# Patient Record
Sex: Female | Born: 1965 | Hispanic: Yes | Marital: Married | Smoking: Never smoker
Health system: Southern US, Community
[De-identification: ages and names within clinical notes are randomized; demographics above are authoritative.]

## PROBLEM LIST (undated history)

## (undated) DIAGNOSIS — K5900 Constipation, unspecified: Secondary | ICD-10-CM

## (undated) DIAGNOSIS — F419 Anxiety disorder, unspecified: Secondary | ICD-10-CM

## (undated) DIAGNOSIS — T783XXA Angioneurotic edema, initial encounter: Secondary | ICD-10-CM

## (undated) DIAGNOSIS — I10 Essential (primary) hypertension: Secondary | ICD-10-CM

## (undated) DIAGNOSIS — L508 Other urticaria: Secondary | ICD-10-CM

## (undated) DIAGNOSIS — F32A Depression, unspecified: Secondary | ICD-10-CM

## (undated) DIAGNOSIS — T464X5A Adverse effect of angiotensin-converting-enzyme inhibitors, initial encounter: Secondary | ICD-10-CM

## (undated) DIAGNOSIS — N200 Calculus of kidney: Secondary | ICD-10-CM

## (undated) DIAGNOSIS — E785 Hyperlipidemia, unspecified: Secondary | ICD-10-CM

## (undated) DIAGNOSIS — N390 Urinary tract infection, site not specified: Secondary | ICD-10-CM

## (undated) DIAGNOSIS — T7840XA Allergy, unspecified, initial encounter: Secondary | ICD-10-CM

## (undated) DIAGNOSIS — J302 Other seasonal allergic rhinitis: Secondary | ICD-10-CM

## (undated) HISTORY — DX: Adverse effect of angiotensin-converting-enzyme inhibitors, initial encounter: T46.4X5A

## (undated) HISTORY — DX: Essential (primary) hypertension: I10

## (undated) HISTORY — DX: Hyperlipidemia, unspecified: E78.5

## (undated) HISTORY — DX: Constipation, unspecified: K59.00

## (undated) HISTORY — DX: Calculus of kidney: N20.0

## (undated) HISTORY — DX: Allergy, unspecified, initial encounter: T78.40XA

## (undated) HISTORY — DX: Depression, unspecified: F32.A

## (undated) HISTORY — DX: Angioneurotic edema, initial encounter: T78.3XXA

## (undated) HISTORY — DX: Urinary tract infection, site not specified: N39.0

## (undated) HISTORY — DX: Other seasonal allergic rhinitis: J30.2

## (undated) HISTORY — DX: Other urticaria: L50.8

---

## 1991-12-05 HISTORY — PX: OTHER SURGICAL HISTORY: SHX169

## 1991-12-05 HISTORY — PX: BREAST ENHANCEMENT SURGERY: SHX7

## 1993-12-04 HISTORY — PX: TUBAL LIGATION: SHX77

## 2007-01-09 ENCOUNTER — Other Ambulatory Visit: Admission: RE | Admit: 2007-01-09 | Discharge: 2007-01-09 | Payer: Self-pay | Admitting: Obstetrics and Gynecology

## 2007-04-01 ENCOUNTER — Emergency Department (HOSPITAL_COMMUNITY): Admission: EM | Admit: 2007-04-01 | Discharge: 2007-04-01 | Payer: Self-pay | Admitting: Emergency Medicine

## 2008-03-16 ENCOUNTER — Encounter: Admission: RE | Admit: 2008-03-16 | Discharge: 2008-03-16 | Payer: Self-pay | Admitting: Family Medicine

## 2010-01-13 ENCOUNTER — Ambulatory Visit (HOSPITAL_COMMUNITY): Admission: RE | Admit: 2010-01-13 | Discharge: 2010-01-13 | Payer: Self-pay | Admitting: Obstetrics and Gynecology

## 2010-12-25 ENCOUNTER — Encounter: Payer: Self-pay | Admitting: Obstetrics and Gynecology

## 2011-07-17 ENCOUNTER — Other Ambulatory Visit (INDEPENDENT_AMBULATORY_CARE_PROVIDER_SITE_OTHER): Payer: BC Managed Care – PPO

## 2011-07-17 ENCOUNTER — Encounter: Payer: Self-pay | Admitting: Internal Medicine

## 2011-07-17 ENCOUNTER — Other Ambulatory Visit: Payer: Self-pay | Admitting: Internal Medicine

## 2011-07-17 ENCOUNTER — Ambulatory Visit (INDEPENDENT_AMBULATORY_CARE_PROVIDER_SITE_OTHER): Payer: BC Managed Care – PPO | Admitting: Internal Medicine

## 2011-07-17 VITALS — BP 138/90 | HR 97 | Temp 98.2°F | Ht 63.0 in | Wt 181.0 lb

## 2011-07-17 DIAGNOSIS — IMO0001 Reserved for inherently not codable concepts without codable children: Secondary | ICD-10-CM

## 2011-07-17 DIAGNOSIS — T464X5A Adverse effect of angiotensin-converting-enzyme inhibitors, initial encounter: Secondary | ICD-10-CM | POA: Insufficient documentation

## 2011-07-17 DIAGNOSIS — I10 Essential (primary) hypertension: Secondary | ICD-10-CM | POA: Insufficient documentation

## 2011-07-17 DIAGNOSIS — E785 Hyperlipidemia, unspecified: Secondary | ICD-10-CM | POA: Insufficient documentation

## 2011-07-17 DIAGNOSIS — T783XXA Angioneurotic edema, initial encounter: Secondary | ICD-10-CM | POA: Insufficient documentation

## 2011-07-17 DIAGNOSIS — E118 Type 2 diabetes mellitus with unspecified complications: Secondary | ICD-10-CM | POA: Insufficient documentation

## 2011-07-17 DIAGNOSIS — Z0000 Encounter for general adult medical examination without abnormal findings: Secondary | ICD-10-CM

## 2011-07-17 DIAGNOSIS — E7800 Pure hypercholesterolemia, unspecified: Secondary | ICD-10-CM | POA: Insufficient documentation

## 2011-07-17 DIAGNOSIS — Z794 Long term (current) use of insulin: Secondary | ICD-10-CM | POA: Insufficient documentation

## 2011-07-17 LAB — BASIC METABOLIC PANEL
BUN: 11 mg/dL (ref 6–23)
Calcium: 8.9 mg/dL (ref 8.4–10.5)
Glucose, Bld: 312 mg/dL — ABNORMAL HIGH (ref 70–99)

## 2011-07-17 LAB — HEPATIC FUNCTION PANEL
AST: 18 U/L (ref 0–37)
Albumin: 3.9 g/dL (ref 3.5–5.2)
Bilirubin, Direct: 0.1 mg/dL (ref 0.0–0.3)
Total Bilirubin: 0.4 mg/dL (ref 0.3–1.2)
Total Protein: 7.6 g/dL (ref 6.0–8.3)

## 2011-07-17 LAB — URINALYSIS, ROUTINE W REFLEX MICROSCOPIC
Bilirubin Urine: NEGATIVE
Hgb urine dipstick: NEGATIVE
Nitrite: NEGATIVE
Specific Gravity, Urine: 1.02 (ref 1.000–1.030)
Urine Glucose: >=1000
pH: 7 (ref 5.0–8.0)

## 2011-07-17 LAB — CBC WITH DIFFERENTIAL/PLATELET
Basophils Relative: 0.2 % (ref 0.0–3.0)
Eosinophils Relative: 1.2 % (ref 0.0–5.0)
HCT: 39.9 % (ref 36.0–46.0)
Hemoglobin: 13 g/dL (ref 12.0–15.0)
Lymphocytes Relative: 30.9 % (ref 12.0–46.0)
Lymphs Abs: 2 10*3/uL (ref 0.7–4.0)
Monocytes Relative: 6.9 % (ref 3.0–12.0)
Neutro Abs: 3.9 10*3/uL (ref 1.4–7.7)
RDW: 15.2 % — ABNORMAL HIGH (ref 11.5–14.6)

## 2011-07-17 LAB — LIPID PANEL
HDL: 42.6 mg/dL (ref >39.00)
Triglycerides: 248 mg/dL — ABNORMAL HIGH (ref 0.0–149.0)
VLDL: 49.6 mg/dL — ABNORMAL HIGH (ref 0.0–40.0)

## 2011-07-17 LAB — LDL CHOLESTEROL, DIRECT: Direct LDL: 145.9 mg/dL

## 2011-07-17 MED ORDER — GLIMEPIRIDE 4 MG PO TABS: 4.0000 mg | ORAL_TABLET | ORAL | Status: DC

## 2011-07-17 MED ORDER — ASPIRIN EC 81 MG PO TBEC: 81.0000 mg | DELAYED_RELEASE_TABLET | Freq: Every day | ORAL | Status: AC

## 2011-07-17 MED ORDER — LISINOPRIL-HYDROCHLOROTHIAZIDE 20-12.5 MG PO TABS: 1.0000 | ORAL_TABLET | Freq: Every day | ORAL | Status: DC

## 2011-07-17 NOTE — Patient Instructions (Addendum)
Please remember to followup with your GYN for the yearly pap smear and/or mammogram Please go to LAB in the Basement for the blood and/or urine tests to be done today Please call the phone number [phone] (the PhoneTree System) for results of testing in 2-3 days;  When calling, simply dial the number, and when prompted enter the MRN number above (the Medical Record Number) and the # key, then the message should start. Please see your dentist for your upper back left teeth Please start Aspirin 81 mg - 1 per day, COATED only OK to stop the byetta and metformin as you have Start the Glimeparide 4 mg per day Continue all other medications as before - the lisinopril-HCT Based on your Blood tests, we may need to add further medicaiton such as actos or januvia for the sugar Please return in 6 mo with Lab testing done 3-5 days before

## 2011-07-17 NOTE — Progress Notes (Signed)
Subjective:    Patient ID: Holly Ortiz, female    DOB: 24-Oct-1966, 45 y.o.   MRN: [id number]  HPI  Here for wellness and f/u;  Overall doing ok;  Pt denies CP, worsening SOB, DOE, wheezing, orthopnea, PND, worsening LE edema, palpitations, dizziness or syncope.  Pt denies neurological change such as new Headache, facial or extremity weakness.  Pt denies polydipsia, polyuria, or low sugar symptoms. Pt states overall good compliance with treatment and medications, good tolerability, and trying to follow lower cholesterol diet.  Pt denies worsening depressive symptoms, suicidal ideation or panic. No fever, wt loss, night sweats, loss of appetite, or other constitutional symptoms.  Pt states good ability with ADL's, low fall risk, home safety reviewed and adequate, no significant changes in hearing or vision, and occasionally active with exercise.  Gets some nausea with byetta and metformin, and out of all meds since late June 2012. Was seeing urgent care for routine care, not owrking out - sugars more than 200 even on the meds. Past Medical History  Diagnosis Date  . UTI (lower urinary tract infection)   . Allergy   . Asthma   . Diabetes mellitus     type II  . Hyperlipidemia   . Hypertension    History reviewed. No pertinent past surgical history.  reports that she has never smoked. She does not have any smokeless tobacco history on file. She reports that she does not drink alcohol or use illicit drugs. family history includes Cancer in her other; Diabetes in her other; Heart disease in her other; Hyperlipidemia in her other; and Hypertension in her other. No Known Allergies No current outpatient prescriptions on file prior to visit.   Review of Systems Review of Systems  Constitutional: Negative for diaphoresis, activity change, appetite change and unexpected weight change.  HENT: Negative for hearing loss, ear pain, facial swelling, mouth sores and neck stiffness.   Eyes: Negative for  pain, redness and visual disturbance.  Respiratory: Negative for shortness of breath and wheezing.   Cardiovascular: Negative for chest pain and palpitations.  Gastrointestinal: Negative for diarrhea, blood in stool, abdominal distention and rectal pain.  Genitourinary: Negative for hematuria, flank pain and decreased urine volume.  Musculoskeletal: Negative for myalgias and joint swelling.  Skin: Negative for color change and wound.  Neurological: Negative for syncope and numbness.  Hematological: Negative for adenopathy.  Psychiatric/Behavioral: Negative for hallucinations, self-injury, decreased concentration and agitation.      Objective:   Physical Exam BP 138/90  Pulse 97  Temp(Src) 98.2 F (36.8 C) (Oral)  Ht 5\' 3"  (1.6 m)  Wt 181 lb (82.101 kg)  BMI 32.06 kg/m2  SpO2 97%  LMP 07/05/2011 Physical Exam  VS noted Constitutional: Pt is oriented to person, place, and time. Appears well-developed and well-nourished.  HENT:  Head: Normocephalic and atraumatic.  Right Ear: External ear normal.  Left Ear: External ear normal.  Nose: Nose normal.  Mouth/Throat: Oropharynx is clear and moist. upper back left teeth are carious Eyes: Conjunctivae and EOM are normal. Pupils are equal, round, and reactive to light.  Neck: Normal range of motion. Neck supple. No JVD present. No tracheal deviation present.  Cardiovascular: Normal rate, regular rhythm, normal heart sounds and intact distal pulses.   Pulmonary/Chest: Effort normal and breath sounds normal.  Abdominal: Soft. Bowel sounds are normal. There is no tenderness.  Musculoskeletal: Normal range of motion. Exhibits no edema.  Lymphadenopathy:  Has no cervical adenopathy.  Neurological: Pt is alert and oriented  to person, place, and time. Pt has normal reflexes. No cranial nerve deficit.  Skin: Skin is warm and dry. No rash noted.  Psychiatric:  Has  normal mood and affect. Behavior is normal.  1+ nervous       Assessment &  Plan:

## 2011-07-18 ENCOUNTER — Other Ambulatory Visit: Payer: Self-pay | Admitting: Internal Medicine

## 2011-07-18 MED ORDER — ATORVASTATIN CALCIUM 20 MG PO TABS: 20.0000 mg | ORAL_TABLET | Freq: Every day | ORAL | Status: DC

## 2011-07-18 MED ORDER — PIOGLITAZONE HCL 30 MG PO TABS: 30.0000 mg | ORAL_TABLET | Freq: Every day | ORAL | Status: DC

## 2011-10-17 ENCOUNTER — Telehealth: Payer: Self-pay

## 2011-10-17 MED ORDER — SITAGLIPTIN PHOSPHATE 100 MG PO TABS: 100.0000 mg | ORAL_TABLET | Freq: Every day | ORAL | Status: DC

## 2011-10-17 NOTE — Telephone Encounter (Signed)
Patient informed. 

## 2011-10-17 NOTE — Telephone Encounter (Signed)
Pt called stating that one of the listed side effects of Actos is weight gain and she has gained 15 lbs since starting medication in Aug. She has decided to D/C medication but is requesting alternative therapy, please advise.

## 2011-10-17 NOTE — Telephone Encounter (Signed)
Ok to change to [place] 100 mg qd - done per [employer]

## 2012-01-19 ENCOUNTER — Ambulatory Visit: Payer: BC Managed Care – PPO | Admitting: Internal Medicine

## 2012-01-19 DIAGNOSIS — Z0289 Encounter for other administrative examinations: Secondary | ICD-10-CM

## 2012-07-31 ENCOUNTER — Encounter: Payer: Self-pay | Admitting: Obstetrics and Gynecology

## 2012-07-31 ENCOUNTER — Ambulatory Visit (INDEPENDENT_AMBULATORY_CARE_PROVIDER_SITE_OTHER): Payer: BC Managed Care – PPO | Admitting: Obstetrics and Gynecology

## 2012-07-31 VITALS — BP 122/80 | HR 82 | Ht 63.0 in | Wt 185.0 lb

## 2012-07-31 DIAGNOSIS — N390 Urinary tract infection, site not specified: Secondary | ICD-10-CM

## 2012-07-31 DIAGNOSIS — IMO0002 Reserved for concepts with insufficient information to code with codable children: Secondary | ICD-10-CM

## 2012-07-31 DIAGNOSIS — Z01419 Encounter for gynecological examination (general) (routine) without abnormal findings: Secondary | ICD-10-CM

## 2012-07-31 DIAGNOSIS — Z124 Encounter for screening for malignant neoplasm of cervix: Secondary | ICD-10-CM

## 2012-07-31 LAB — POCT URINALYSIS DIPSTICK
Blood, UA: NEGATIVE
Nitrite, UA: NEGATIVE
Protein, UA: NEGATIVE
Urobilinogen, UA: NEGATIVE
pH, UA: 5

## 2012-07-31 NOTE — Progress Notes (Signed)
Subjective:    Holly Ortiz is a 46 y.o. female, G4P0, who presents for an annual exam. The patient requests a check for urinary tract infection.  Also reports increased mood changes prior to menses over the past 6 months. This is accompanied by premenstrual bloating, fatigue, sleep disturbance, cravings, and loose stools. Denies breast pain.  She further admits to deep dyspareunia that occurs after intercourse and is described as crampy, lasting for hours or a day.  Husband works out of town so intercourse is infrequent.   Menstrual cycle:   LMP: Patient's last menstrual period was 07/05/2012.  Flow 7-10 days, overnight pad change every 1-1.5 hours; no cramps, just leg pain [CBC-wnl & TSH-wnl from 07/2012]             Review of Systems Pertinent items are noted in HPI. Denies pelvic pain, urinary tract symptoms, vaginitis symptoms, irregular bleeding, menopausal symptoms, change in bowel habits or rectal bleeding   Objective:    BP 122/80  Pulse 82  Ht 5\' 3"  (1.6 m)  Wt 185 lb (83.915 kg)  BMI 32.77 kg/m2  LMP 07/05/2012   Wt Readings from Last 1 Encounters:  07/31/12 185 lb (83.915 kg)   Body mass index is 32.77 kg/(m^2). General Appearance: Alert, no acute distress HEENT: Grossly normal Neck / Thyroid: Supple, no thyromegaly or cervical adenopathy Lungs: Clear to auscultation bilaterally Back: No CVA tenderness Breast Exam: No masses or nodes.No dimpling, nipple retraction or discharge. Cardiovascular: Regular rate and rhythm.  Gastrointestinal: Soft, non-tender, no masses or organomegaly Pelvic Exam: EGBUS-wnl, vagina-normal rugae, cervix- without lesions or tenderness, uterus appears normal size shape and consistency, adnexae-no masses or tenderness Rectovaginal: deferred due to multiple hemorrhoids (patient request) Lymphatic Exam: Non-palpable nodes in neck, clavicular,  axillary, or inguinal regions  Skin: no rashes or abnormalities Extremities: no clubbing cyanosis  or edema  Neurologic: grossly normal Psychiatric: Alert and oriented  Urinalysis: PH- 5.0, SG- 1.020, 3+ glucose otherwise negative   Assessment:   Routine GYN Exam Diabetes (uncontrolled) Menorrhagia PMS Dyspareunia Glucosuria Plan:  Reviewed management options for PMS: BCPs, Sarafem, Progesterone, Fem-Premenstrual  & supplements  Patient to try Fem-Premenstrual  Routnine MG  Pelvic U/S- dyspareunia  PAP sent  RTO 1 year or prn  Haakon Titsworth,ELMIRAPA-C

## 2012-07-31 NOTE — Progress Notes (Signed)
Regular Periods: yes Mammogram: yes  Monthly Breast Ex.: no Exercise: yes  Tetanus < 10 years: yes Seatbelts: yes  NI. Bladder Functn.: yes Abuse at home: no  Daily BM's: yes Stressful Work: yes  Healthy Diet: yes Sigmoid-Colonoscopy: age 46  Calcium: no Medical problems this year: request recheck on urine for uti   LAST PAP:2 years ago  Contraception: btl  Mammogram:  2 years ago  PCP: DR. Oliver Barre  PMH: NO CHANGE  FMH: NO CHANGE  Last Bone Scan: NO   PT STATES THAT AROUND HER CYCLE SHE IS VERY EMOTIONAL AND MOODY. SHE HAS NOT FELT THIS WAY BEFORE.

## 2012-07-31 NOTE — Patient Instructions (Signed)
Fem-Premenstrual from [facility] for PMS  May also want to add Calcium with Magnesium and Vitamin D tablet daily to help with PMS

## 2012-08-01 LAB — PAP IG W/ RFLX HPV ASCU

## 2012-08-07 ENCOUNTER — Encounter: Payer: BC Managed Care – PPO | Admitting: Obstetrics and Gynecology

## 2012-08-07 ENCOUNTER — Other Ambulatory Visit: Payer: BC Managed Care – PPO

## 2012-08-08 ENCOUNTER — Ambulatory Visit
Admission: RE | Admit: 2012-08-08 | Discharge: 2012-08-08 | Disposition: A | Discharge status: Home / Self Care | Payer: BC Managed Care – PPO | Source: Ambulatory Visit | Attending: Obstetrics and Gynecology | Admitting: Obstetrics and Gynecology

## 2012-08-08 DIAGNOSIS — Z01419 Encounter for gynecological examination (general) (routine) without abnormal findings: Secondary | ICD-10-CM

## 2012-08-13 ENCOUNTER — Encounter: Payer: Self-pay | Admitting: Obstetrics and Gynecology

## 2012-08-13 ENCOUNTER — Ambulatory Visit (INDEPENDENT_AMBULATORY_CARE_PROVIDER_SITE_OTHER): Payer: BC Managed Care – PPO | Admitting: Obstetrics and Gynecology

## 2012-08-13 ENCOUNTER — Ambulatory Visit (INDEPENDENT_AMBULATORY_CARE_PROVIDER_SITE_OTHER): Payer: BC Managed Care – PPO

## 2012-08-13 VITALS — BP 124/72 | Temp 98.9°F | Wt 190.0 lb

## 2012-08-13 DIAGNOSIS — IMO0002 Reserved for concepts with insufficient information to code with codable children: Secondary | ICD-10-CM

## 2012-08-13 NOTE — Progress Notes (Signed)
46 YO with dyspareunia for an ultrasound.  O:  U/S- uterus is 9.49 x 5.31 x 4.63 cm, left ovary-4.85 x 2.71 x 2.91 cm and right ovary-2.30 x 1.26 x 1.41 cm  A: Dyspareunia  P: Reviewed causes of pelvic pain: urogenital, previous surgery, gastrointestinal and musculoskeletal.       RTO-as scheduled or prn  River Ambrosio, PA-C

## 2012-12-04 HISTORY — PX: CATARACT EXTRACTION, BILATERAL: SHX1313

## 2012-12-04 HISTORY — PX: COLONOSCOPY: SHX174

## 2013-12-04 DIAGNOSIS — H269 Unspecified cataract: Secondary | ICD-10-CM

## 2013-12-04 HISTORY — DX: Unspecified cataract: H26.9

## 2014-10-05 ENCOUNTER — Encounter: Payer: Self-pay | Admitting: Obstetrics and Gynecology

## 2014-12-04 HISTORY — PX: SOFT TISSUE MASS EXCISION: SHX2419

## 2017-11-14 ENCOUNTER — Ambulatory Visit: Payer: Self-pay

## 2017-11-30 ENCOUNTER — Ambulatory Visit: Payer: Self-pay | Admitting: Family Medicine

## 2017-12-18 ENCOUNTER — Encounter: Payer: Self-pay | Admitting: Internal Medicine

## 2017-12-18 ENCOUNTER — Ambulatory Visit: Payer: Self-pay | Admitting: Internal Medicine

## 2017-12-18 VITALS — BP 170/100 | HR 86 | Resp 12 | Ht 61.5 in | Wt 182.0 lb

## 2017-12-18 DIAGNOSIS — Z79899 Other long term (current) drug therapy: Secondary | ICD-10-CM

## 2017-12-18 DIAGNOSIS — E785 Hyperlipidemia, unspecified: Secondary | ICD-10-CM

## 2017-12-18 DIAGNOSIS — I10 Essential (primary) hypertension: Secondary | ICD-10-CM

## 2017-12-18 DIAGNOSIS — T7840XD Allergy, unspecified, subsequent encounter: Secondary | ICD-10-CM

## 2017-12-18 DIAGNOSIS — L508 Other urticaria: Secondary | ICD-10-CM

## 2017-12-18 DIAGNOSIS — E119 Type 2 diabetes mellitus without complications: Secondary | ICD-10-CM

## 2017-12-18 LAB — GLUCOSE, POCT (MANUAL RESULT ENTRY): POC Glucose: 484 mg/dl — AB (ref 70–99)

## 2017-12-18 MED ORDER — GLIMEPIRIDE 4 MG PO TABS: 4.0000 mg | ORAL_TABLET | ORAL | 3 refills | Status: DC

## 2017-12-18 MED ORDER — ESCITALOPRAM OXALATE 20 MG PO TABS: 20.0000 mg | ORAL_TABLET | Freq: Every day | ORAL | 3 refills | Status: DC

## 2017-12-18 MED ORDER — METFORMIN HCL 500 MG PO TABS: 500.0000 mg | ORAL_TABLET | Freq: Two times a day (BID) | ORAL | 3 refills | Status: DC

## 2017-12-18 MED ORDER — LISINOPRIL-HYDROCHLOROTHIAZIDE 20-12.5 MG PO TABS: 1.0000 | ORAL_TABLET | Freq: Every day | ORAL | 3 refills | Status: DC

## 2017-12-18 MED ORDER — ATORVASTATIN CALCIUM 20 MG PO TABS: 20.0000 mg | ORAL_TABLET | Freq: Every day | ORAL | 3 refills | Status: DC

## 2017-12-18 NOTE — Progress Notes (Signed)
Subjective:    Patient ID: Holly Ortiz, female    DOB: 20-Nov-1966, 52 y.o.   MRN: [id number]  HPI   Here to establish  Ran out of all medication save for Escitalopram, which she takes for Depression/Anxiety, in August when ran out of insurance.  She and husband moved down to [place] following his retirement.   She has never been without a job or insurance previously.   1.  Essential Hypertension:  Had PIH with all 4 of her pregnancies and maintained hypertension in 1995 following birth of 4th child.  Has always been well controlled with medication.  Lisinopril/HCTZ.  2.  DM:  Diagnosed age 16 yo.  Control up and down with medication.   Walks half mile daily. Could improve diet.  Cannot recall last A1C in June 2018--was at 9.0% perhaps. Did not get an influenza vaccine this year. Pneumococcal vaccine about 3 years. Does check feet every night. No peripheral neuropathy Has had cataract surgery in 2015, bilateral No diabetic retinopathy. Last eye exam 2017   3.  Allergies:  Breaks out in hives for 11 years.  Has been fully evaluated by different specialists.  Not able to find the etiology.  Singulair helps.  Has never taken antihistamines other than Benadryl.  Current Meds  Medication Sig  . aspirin 81 MG tablet Take 81 mg by mouth daily.  Marland Kitchen escitalopram (LEXAPRO) 20 MG tablet Take 20 mg by mouth daily.    Allergies  Allergen Reactions  . Actos [Pioglitazone Hydrochloride] Other (See Comments)    Wt gain   Past Medical History:  Diagnosis Date  . Allergy   . Asthma   . Diabetes mellitus    type II  . Hyperlipidemia   . Hypertension   . UTI (lower urinary tract infection)     Past Surgical History:  Procedure Laterality Date  . CATARACT EXTRACTION, BILATERAL Bilateral 2014  . SOFT TISSUE MASS EXCISION Right 2016   [place] area  . TUBAL LIGATION  1995    Family History  Problem Relation Age of Onset  . Hyperlipidemia Other   . Heart disease Other   . Diabetes  Father   . Hypertension Father   . COPD Father        Cause of death  . Colon cancer Mother   . Hypertension Mother   . Hypertension Sister   . Diabetes Sister   . Hypertension Brother   . Cancer Sister        Brain glioblastoma   Social History   Socioeconomic History  . Marital status: Married    Spouse name: Audelia Hives  . Number of children: 4  . Years of education: 13--1.5 years of college  . Highest education level: 12th grade  Social Needs  . Financial resource strain: Not on file  . Food insecurity - worry: Sometimes true  . Food insecurity - inability: Never true  . Transportation needs - medical: No  . Transportation needs - non-medical: No  Occupational History  . Occupation: banking  Tobacco Use  . Smoking status: Never Smoker  . Smokeless tobacco: Never Used  Substance and Sexual Activity  . Alcohol use: No  . Drug use: No  . Sexual activity: Yes    Birth control/protection: Surgical    Comment: BTL  Other Topics Concern  . Not on file  Social History Narrative  . Not on file        Review of Systems     Objective:  Physical Exam  NAD Skin:  Macular erythematous lesions scattered mainly on forehead and cheeks as well as anterior base of neck. HEENT;  PERRL, EOMI, discs sharp, TMs pearly gray, throat without injection.  Multiple missing molars. Neck:  Supple, No adenopathy, no thyromegaly Chest:  CTA CV: RRR with normal S1 and S2, No S3, S4 or murmur.  No carotid bruits.  Carotid, radial and DP pulses normal and equal Abd:  S, NT, No HSM or mass, + BS.  Abdomen obese LE:  No edema Cool cat shoes.      Assessment & Plan:  1.  DM:  Refill Metformin and Glimepiride.  Later, confirmed Januvia is available through [facility] at [facility].  Will go ahead and send Rx there and notify patient. Has glucometer and strips at home and will monitor blood glucose and bring in to next visit. To work on diet and gradually increase physical activity.  2. Essential  Hypertension:  Restart Lisinopril/HCTZ..  3.  Depression:  Encouraged working with Macie Burows, LCSW.  Escitalopram refilled as well.  4.  Recurrent idiopathic Hives:  Loratadine 10 mg daily. GoodRx for Montelukast 10 mg daily until can get orange card.  5.  Hyperlipidemia:  Restart Atorvastatin.  Follow up in 1 month for BP check and fasting labs:  FLP.  With me in 2 months with glucose monitoring documents.

## 2017-12-18 NOTE — Progress Notes (Signed)
LCSW completed new patient screening with pt. Pt reported that she is experiencing anhedonia, depression, fatigue, and feelings of worthlessness since she has been unable to find a job over the past 7 months. She denied any suicidal ideation. She shared that while she feels down, she does not have any issues with her basic needs/social determinants of health. She reported that she does not want counseling now but may call in the future if needed. LCSW provided contact info and encouraged pt to call if she is interested in counseling in the future.

## 2017-12-18 NOTE — Patient Instructions (Addendum)
Can google "advance directives, [place]"  And bring up form from Secretary of [place]. Print and fill out Or can go to "5 wishes"  Which is also in Spanish and fill out--this costs $5--perhaps easier to use. Designate a Forensic scientist to speak for you if you are unable to speak for yourself when ill or injured   Drink a glass of water before every meal Drink 6-8 glasses of water daily Eat three meals daily Eat a protein and healthy fat with every meal (eggs,fish, chicken, [place] and limit red meats) Eat 5 servings of vegetables daily, mix the colors Eat 2 servings of fruit daily with skin, if skin is edible Use smaller plates Put food/utensils down as you chew and swallow each bite Eat at a table with friends/family at least once daily, no TV Do not eat in front of the TV  Recent studies show that people who consume all of their calories in a 12 hour period lose weight more efficiently.  For example, if you eat your first meal at 7:00 a.m., your last meal of the day should be completed by 7:00 p.m.

## 2017-12-19 ENCOUNTER — Other Ambulatory Visit: Payer: Self-pay | Admitting: Internal Medicine

## 2017-12-19 ENCOUNTER — Encounter: Payer: Self-pay | Admitting: Internal Medicine

## 2017-12-19 DIAGNOSIS — D649 Anemia, unspecified: Secondary | ICD-10-CM

## 2017-12-19 LAB — COMPREHENSIVE METABOLIC PANEL
ALK PHOS: 83 IU/L (ref 39–117)
ALT: 27 IU/L (ref 0–32)
AST: 25 IU/L (ref 0–40)
Albumin/Globulin Ratio: 1.3 (ref 1.2–2.2)
Albumin: 4.3 g/dL (ref 3.5–5.5)
BUN / CREAT RATIO: 23 (ref 9–23)
BUN: 11 mg/dL (ref 6–24)
Bilirubin Total: 0.2 mg/dL (ref 0.0–1.2)
CO2: 25 mmol/L (ref 20–29)
CREATININE: 0.47 mg/dL — AB (ref 0.57–1.00)
Calcium: 9.8 mg/dL (ref 8.7–10.2)
Chloride: 97 mmol/L (ref 96–106)
GFR calc Af Amer: 133 mL/min/{1.73_m2} (ref >59)
GFR calc non Af Amer: 116 mL/min/{1.73_m2} (ref >59)
GLOBULIN, TOTAL: 3.3 g/dL (ref 1.5–4.5)
Glucose: 362 mg/dL — ABNORMAL HIGH (ref 65–99)
Potassium: 4.5 mmol/L (ref 3.5–5.2)
SODIUM: 139 mmol/L (ref 134–144)
Total Protein: 7.6 g/dL (ref 6.0–8.5)

## 2017-12-19 LAB — MICROALBUMIN / CREATININE URINE RATIO
Creatinine, Urine: 44.4 mg/dL
Microalb/Creat Ratio: 56.8 mg/g creat — ABNORMAL HIGH (ref 0.0–30.0)
Microalbumin, Urine: 25.2 ug/mL

## 2017-12-19 LAB — CBC WITH DIFFERENTIAL/PLATELET
BASOS: 0 %
Basophils Absolute: 0 10*3/uL (ref 0.0–0.2)
EOS (ABSOLUTE): 0.1 10*3/uL (ref 0.0–0.4)
EOS: 1 %
HEMATOCRIT: 37.7 % (ref 34.0–46.6)
Hemoglobin: 11.7 g/dL (ref 11.1–15.9)
Immature Grans (Abs): 0 10*3/uL (ref 0.0–0.1)
Immature Granulocytes: 0 %
Lymphocytes Absolute: 2 10*3/uL (ref 0.7–3.1)
Lymphs: 23 %
MCH: 23.6 pg — AB (ref 26.6–33.0)
MCHC: 31 g/dL — AB (ref 31.5–35.7)
MCV: 76 fL — AB (ref 79–97)
MONOS ABS: 0.5 10*3/uL (ref 0.1–0.9)
Monocytes: 5 %
Neutrophils Absolute: 5.9 10*3/uL (ref 1.4–7.0)
Neutrophils: 71 %
Platelets: 406 10*3/uL — ABNORMAL HIGH (ref 150–379)
RBC: 4.96 x10E6/uL (ref 3.77–5.28)
RDW: 17 % — AB (ref 12.3–15.4)
WBC: 8.5 10*3/uL (ref 3.4–10.8)

## 2017-12-19 LAB — HGB A1C W/O EAG: Hgb A1c MFr Bld: 13.2 % — ABNORMAL HIGH (ref 4.8–5.6)

## 2018-01-03 ENCOUNTER — Inpatient Hospital Stay (HOSPITAL_COMMUNITY)
Admission: EM | Admit: 2018-01-03 | Discharge: 2018-01-05 | DRG: 638 | Disposition: A | Discharge status: Home / Self Care | Payer: Self-pay | Attending: Internal Medicine | Admitting: Internal Medicine

## 2018-01-03 ENCOUNTER — Encounter (HOSPITAL_COMMUNITY): Payer: Self-pay

## 2018-01-03 ENCOUNTER — Other Ambulatory Visit: Payer: Self-pay

## 2018-01-03 ENCOUNTER — Emergency Department (HOSPITAL_COMMUNITY): Payer: Self-pay

## 2018-01-03 DIAGNOSIS — R739 Hyperglycemia, unspecified: Secondary | ICD-10-CM | POA: Insufficient documentation

## 2018-01-03 DIAGNOSIS — E860 Dehydration: Secondary | ICD-10-CM | POA: Diagnosis present

## 2018-01-03 DIAGNOSIS — E872 Acidosis: Secondary | ICD-10-CM

## 2018-01-03 DIAGNOSIS — Z7984 Long term (current) use of oral hypoglycemic drugs: Secondary | ICD-10-CM

## 2018-01-03 DIAGNOSIS — Z79899 Other long term (current) drug therapy: Secondary | ICD-10-CM

## 2018-01-03 DIAGNOSIS — Z7982 Long term (current) use of aspirin: Secondary | ICD-10-CM

## 2018-01-03 DIAGNOSIS — Z23 Encounter for immunization: Secondary | ICD-10-CM

## 2018-01-03 DIAGNOSIS — E7800 Pure hypercholesterolemia, unspecified: Secondary | ICD-10-CM | POA: Diagnosis present

## 2018-01-03 DIAGNOSIS — I10 Essential (primary) hypertension: Secondary | ICD-10-CM | POA: Diagnosis present

## 2018-01-03 DIAGNOSIS — D72829 Elevated white blood cell count, unspecified: Secondary | ICD-10-CM | POA: Diagnosis present

## 2018-01-03 DIAGNOSIS — E785 Hyperlipidemia, unspecified: Secondary | ICD-10-CM | POA: Diagnosis present

## 2018-01-03 DIAGNOSIS — E875 Hyperkalemia: Secondary | ICD-10-CM | POA: Diagnosis present

## 2018-01-03 DIAGNOSIS — Z8249 Family history of ischemic heart disease and other diseases of the circulatory system: Secondary | ICD-10-CM

## 2018-01-03 DIAGNOSIS — J45909 Unspecified asthma, uncomplicated: Secondary | ICD-10-CM | POA: Diagnosis present

## 2018-01-03 DIAGNOSIS — Z833 Family history of diabetes mellitus: Secondary | ICD-10-CM

## 2018-01-03 DIAGNOSIS — E870 Hyperosmolality and hypernatremia: Secondary | ICD-10-CM | POA: Diagnosis present

## 2018-01-03 DIAGNOSIS — Z888 Allergy status to other drugs, medicaments and biological substances status: Secondary | ICD-10-CM

## 2018-01-03 DIAGNOSIS — T783XXA Angioneurotic edema, initial encounter: Secondary | ICD-10-CM | POA: Diagnosis present

## 2018-01-03 DIAGNOSIS — N179 Acute kidney failure, unspecified: Secondary | ICD-10-CM | POA: Diagnosis present

## 2018-01-03 DIAGNOSIS — E1100 Type 2 diabetes mellitus with hyperosmolarity without nonketotic hyperglycemic-hyperosmolar coma (NKHHC): Principal | ICD-10-CM | POA: Diagnosis present

## 2018-01-03 DIAGNOSIS — E1165 Type 2 diabetes mellitus with hyperglycemia: Secondary | ICD-10-CM | POA: Diagnosis present

## 2018-01-03 LAB — I-STAT CG4 LACTIC ACID, ED: Lactic Acid, Venous: 2.29 mmol/L (ref 0.5–1.9)

## 2018-01-03 LAB — CBG MONITORING, ED
GLUCOSE-CAPILLARY: 379 mg/dL — AB (ref 65–99)
Glucose-Capillary: 431 mg/dL — ABNORMAL HIGH (ref 65–99)

## 2018-01-03 LAB — COMPREHENSIVE METABOLIC PANEL
ALBUMIN: 3.4 g/dL — AB (ref 3.5–5.0)
ALT: 22 U/L (ref 14–54)
AST: 24 U/L (ref 15–41)
Alkaline Phosphatase: 68 U/L (ref 38–126)
Anion gap: 14 (ref 5–15)
BUN: 47 mg/dL — AB (ref 6–20)
CHLORIDE: 100 mmol/L — AB (ref 101–111)
CO2: 15 mmol/L — ABNORMAL LOW (ref 22–32)
Calcium: 9 mg/dL (ref 8.9–10.3)
Creatinine, Ser: 3.1 mg/dL — ABNORMAL HIGH (ref 0.44–1.00)
GFR calc Af Amer: 19 mL/min — ABNORMAL LOW (ref >60)
GFR calc non Af Amer: 16 mL/min — ABNORMAL LOW (ref >60)
GLUCOSE: 458 mg/dL — AB (ref 65–99)
POTASSIUM: 5.4 mmol/L — AB (ref 3.5–5.1)
Sodium: 129 mmol/L — ABNORMAL LOW (ref 135–145)
Total Bilirubin: 0.8 mg/dL (ref 0.3–1.2)
Total Protein: 7 g/dL (ref 6.5–8.1)

## 2018-01-03 LAB — CBC
HCT: 41.5 % (ref 36.0–46.0)
HEMOGLOBIN: 13.2 g/dL (ref 12.0–15.0)
MCH: 24.3 pg — ABNORMAL LOW (ref 26.0–34.0)
MCHC: 31.8 g/dL (ref 30.0–36.0)
MCV: 76.3 fL — ABNORMAL LOW (ref 78.0–100.0)
Platelets: 479 10*3/uL — ABNORMAL HIGH (ref 150–400)
RBC: 5.44 MIL/uL — ABNORMAL HIGH (ref 3.87–5.11)
RDW: 15.1 % (ref 11.5–15.5)
WBC: 16.4 10*3/uL — AB (ref 4.0–10.5)

## 2018-01-03 LAB — I-STAT BETA HCG BLOOD, ED (MC, WL, AP ONLY): I-stat hCG, quantitative: <5 m[IU]/mL (ref <5)

## 2018-01-03 LAB — LACTIC ACID, PLASMA
LACTIC ACID, VENOUS: 1.5 mmol/L (ref 0.5–1.9)
LACTIC ACID, VENOUS: 2.3 mmol/L — AB (ref 0.5–1.9)

## 2018-01-03 LAB — APTT: aPTT: 32 seconds (ref 24–36)

## 2018-01-03 LAB — URINALYSIS, ROUTINE W REFLEX MICROSCOPIC
Bilirubin Urine: NEGATIVE
GLUCOSE, UA: 50 mg/dL — AB
Hgb urine dipstick: NEGATIVE
KETONES UR: NEGATIVE mg/dL
NITRITE: NEGATIVE
PH: 5 (ref 5.0–8.0)
Protein, ur: NEGATIVE mg/dL
Specific Gravity, Urine: 1.018 (ref 1.005–1.030)

## 2018-01-03 LAB — D-DIMER, QUANTITATIVE: D-Dimer, Quant: 3.01 ug/mL-FEU — ABNORMAL HIGH (ref 0.00–0.50)

## 2018-01-03 LAB — LIPASE, BLOOD: Lipase: 34 U/L (ref 11–51)

## 2018-01-03 LAB — GLUCOSE, CAPILLARY: GLUCOSE-CAPILLARY: 324 mg/dL — AB (ref 65–99)

## 2018-01-03 LAB — PROTIME-INR
INR: 1.02
PROTHROMBIN TIME: 13.3 s (ref 11.4–15.2)

## 2018-01-03 LAB — PROCALCITONIN: PROCALCITONIN: 0.16 ng/mL

## 2018-01-03 MED ORDER — INSULIN ASPART 100 UNIT/ML ~~LOC~~ SOLN
0.0000 [IU] | Freq: Three times a day (TID) | SUBCUTANEOUS | Status: DC
Start: 2018-01-04 — End: 2018-01-05
  Administered 2018-01-04: 5 [IU] via SUBCUTANEOUS
  Administered 2018-01-04: 3 [IU] via SUBCUTANEOUS
  Administered 2018-01-05: 8 [IU] via SUBCUTANEOUS
  Administered 2018-01-05: 15 [IU] via SUBCUTANEOUS

## 2018-01-03 MED ORDER — VANCOMYCIN HCL IN DEXTROSE 1-5 GM/200ML-% IV SOLN: 1000.0000 mg | Freq: Once | INTRAVENOUS | Status: DC

## 2018-01-03 MED ORDER — HEPARIN SODIUM (PORCINE) 5000 UNIT/ML IJ SOLN
5000.0000 [IU] | Freq: Three times a day (TID) | INTRAMUSCULAR | Status: DC
  Administered 2018-01-03 – 2018-01-05 (×5): 5000 [IU] via SUBCUTANEOUS
  Filled 2018-01-03 (×5): qty 1

## 2018-01-03 MED ORDER — SODIUM CHLORIDE 0.9 % IV BOLUS (SEPSIS)
1000.0000 mL | Freq: Once | INTRAVENOUS | Status: AC
  Administered 2018-01-03: 1000 mL via INTRAVENOUS

## 2018-01-03 MED ORDER — DEXTROSE 5 % IV SOLN
1.0000 g | INTRAVENOUS | Status: DC
  Administered 2018-01-04 – 2018-01-05 (×2): 1 g via INTRAVENOUS
  Filled 2018-01-03 (×2): qty 1

## 2018-01-03 MED ORDER — VANCOMYCIN HCL 10 G IV SOLR
1500.0000 mg | Freq: Once | INTRAVENOUS | Status: DC
  Administered 2018-01-03: 1500 mg via INTRAVENOUS
  Filled 2018-01-03: qty 1500

## 2018-01-03 MED ORDER — PNEUMOCOCCAL VAC POLYVALENT 25 MCG/0.5ML IJ INJ
0.5000 mL | INJECTION | INTRAMUSCULAR | Status: AC
  Administered 2018-01-05: 0.5 mL via INTRAMUSCULAR
  Filled 2018-01-03: qty 0.5

## 2018-01-03 MED ORDER — INFLUENZA VAC SPLIT QUAD 0.5 ML IM SUSY
0.5000 mL | PREFILLED_SYRINGE | INTRAMUSCULAR | Status: AC
  Administered 2018-01-05: 0.5 mL via INTRAMUSCULAR
  Filled 2018-01-03: qty 0.5

## 2018-01-03 MED ORDER — ONDANSETRON HCL 4 MG/2ML IJ SOLN
4.0000 mg | Freq: Once | INTRAMUSCULAR | Status: AC
  Administered 2018-01-03: 4 mg via INTRAVENOUS
  Filled 2018-01-03: qty 2

## 2018-01-03 MED ORDER — ASPIRIN 81 MG PO TABS: 81.0000 mg | ORAL_TABLET | Freq: Every day | ORAL | Status: DC

## 2018-01-03 MED ORDER — ESCITALOPRAM OXALATE 20 MG PO TABS
20.0000 mg | ORAL_TABLET | Freq: Every day | ORAL | Status: DC
  Administered 2018-01-03 – 2018-01-05 (×3): 20 mg via ORAL
  Filled 2018-01-03: qty 1
  Filled 2018-01-03: qty 2
  Filled 2018-01-03: qty 1

## 2018-01-03 MED ORDER — ONDANSETRON 4 MG PO TBDP
4.0000 mg | ORAL_TABLET | Freq: Once | ORAL | Status: AC | PRN
  Administered 2018-01-03: 4 mg via ORAL
  Filled 2018-01-03: qty 1

## 2018-01-03 MED ORDER — INSULIN ASPART 100 UNIT/ML ~~LOC~~ SOLN
5.0000 [IU] | Freq: Once | SUBCUTANEOUS | Status: AC
  Administered 2018-01-03: 5 [IU] via SUBCUTANEOUS
  Filled 2018-01-03: qty 1

## 2018-01-03 MED ORDER — PIPERACILLIN-TAZOBACTAM 3.375 G IVPB: 3.3750 g | Freq: Three times a day (TID) | INTRAVENOUS | Status: DC

## 2018-01-03 MED ORDER — ASPIRIN 81 MG PO CHEW
81.0000 mg | CHEWABLE_TABLET | Freq: Every day | ORAL | Status: DC
  Administered 2018-01-04 – 2018-01-05 (×2): 81 mg via ORAL
  Filled 2018-01-03 (×2): qty 1

## 2018-01-03 MED ORDER — INSULIN ASPART 100 UNIT/ML ~~LOC~~ SOLN
0.0000 [IU] | Freq: Every day | SUBCUTANEOUS | Status: DC
Start: 2018-01-03 — End: 2018-01-05
  Administered 2018-01-03 – 2018-01-04 (×2): 4 [IU] via SUBCUTANEOUS

## 2018-01-03 MED ORDER — SODIUM CHLORIDE 0.9 % IV SOLN
INTRAVENOUS | Status: DC
  Administered 2018-01-03 – 2018-01-05 (×2): via INTRAVENOUS

## 2018-01-03 MED ORDER — PIPERACILLIN-TAZOBACTAM 3.375 G IVPB 30 MIN
3.3750 g | Freq: Once | INTRAVENOUS | Status: AC
  Administered 2018-01-03: 3.375 g via INTRAVENOUS
  Filled 2018-01-03: qty 50

## 2018-01-03 MED ORDER — ATORVASTATIN CALCIUM 20 MG PO TABS
20.0000 mg | ORAL_TABLET | Freq: Every day | ORAL | Status: DC
  Administered 2018-01-04 – 2018-01-05 (×2): 20 mg via ORAL
  Filled 2018-01-03 (×2): qty 1

## 2018-01-03 NOTE — ED Provider Notes (Signed)
[facility] EMERGENCY DEPARTMENT Provider Note   CSN: [id number] Arrival date & time: 01/03/18  1532     History   Chief Complaint Chief Complaint  Patient presents with  . Emesis    HPI Holly Ortiz is a 52 y.o. female.  HPI   52 year old female here with 2 weeks of v/d.  Patient report she has been having persistent nausea, and has been vomiting 3-4 times a day of nonbloody nonbilious contents and having occasional loose stools without any blood or mucus.  For the past 4 days she has been eating much.  She does not have that much of an appetite.  She tries to drink fluid but states she vomited that up.  She noticed that her blood sugar is elevated at home at 592.  She also endorsed feeling thirsty and urinating more.  She endorsed a mild headache, blurry vision, little bit dizzy, as well as having some shortness of breath on exertion and generalized weakness.  Furthermore, she also complaining of pain to her upper back for the past few days and she describes a sharp waxing waning sensation sometimes worse with taking a deep breath.  She denies any recent sickness.  No fever or chills.  No burning urination.  She did not had a flu shot.  She denies any prior history of PE or DVT, no recent surgery, prolonged bed rest, active cancer or hemoptysis.    Past Medical History:  Diagnosis Date  . Allergy   . Asthma   . Diabetes mellitus    type II  . Hyperlipidemia   . Hypertension   . Urticaria, chronic   . UTI (lower urinary tract infection)     Patient Active Problem List   Diagnosis Date Noted  . Urticaria, chronic   . Asthma   . Diabetes mellitus   . Hyperlipidemia   . Hypertension     Past Surgical History:  Procedure Laterality Date  . CATARACT EXTRACTION, BILATERAL Bilateral 2014  . SOFT TISSUE MASS EXCISION Right 2016   [place] area  . TUBAL LIGATION  1995    OB History    Gravida Para Term Preterm AB Living   4         4   SAB TAB  Ectopic Multiple Live Births                   Home Medications    Prior to Admission medications   Medication Sig Start Date End Date Taking? Authorizing Provider  aspirin 81 MG tablet Take 81 mg by mouth daily.    [provider]  atorvastatin (LIPITOR) 20 MG tablet Take 1 tablet (20 mg total) by mouth daily. 12/18/17 12/18/18  Mack Hook, MD  escitalopram (LEXAPRO) 20 MG tablet Take 1 tablet (20 mg total) by mouth daily. 12/18/17   Mack Hook, MD  glimepiride (AMARYL) 4 MG tablet Take 1 tablet (4 mg total) by mouth every morning. 12/18/17 12/18/18  Mack Hook, MD  lisinopril-hydrochlorothiazide (PRINZIDE,ZESTORETIC) 20-12.5 MG tablet Take 1 tablet by mouth daily. 12/18/17   Mack Hook, MD  metFORMIN (GLUCOPHAGE) 500 MG tablet Take 1 tablet (500 mg total) by mouth 2 (two) times daily with a meal. 12/18/17   Mack Hook, MD  montelukast (SINGULAIR) 10 MG tablet Take 10 mg by mouth at bedtime.    [provider]  sitaGLIPtin (JANUVIA) 100 MG tablet Take 1 tablet (100 mg total) by mouth daily. 10/17/11 10/16/12  Cathlean Cower  W, MD    Family History Family History  Problem Relation Age of Onset  . Hyperlipidemia Other   . Heart disease Other   . Diabetes Father   . Hypertension Father   . COPD Father        Cause of death  . Colon cancer Mother   . Hypertension Mother   . Hypertension Sister   . Diabetes Sister   . Hypertension Brother   . Cancer Sister        Brain glioblastoma    Social History Social History   Tobacco Use  . Smoking status: Never Smoker  . Smokeless tobacco: Never Used  Substance Use Topics  . Alcohol use: No  . Drug use: No     Allergies   Actos [pioglitazone hydrochloride]   Review of Systems Review of Systems  All other systems reviewed and are negative.    Physical Exam Updated Vital Signs BP 91/61   Pulse (!) 106   Temp 97.7 F (36.5 C) (Oral)   Resp 16   LMP 12/20/2017  (Approximate)   SpO2 100%   Physical Exam  Constitutional: She is oriented to person, place, and time. She appears well-developed and well-nourished. No distress.  Obese female nontoxic in appearance  HENT:  Head: Atraumatic.  Mouth/Throat: Oropharynx is clear and moist.  Eyes: Conjunctivae and EOM are normal. Pupils are equal, round, and reactive to light.  Neck: Normal range of motion. Neck supple.  No nuchal rigidity  Cardiovascular: Intact distal pulses.  Mild tachycardia without murmur rubs gallops  Pulmonary/Chest: Effort normal and breath sounds normal. No stridor. No respiratory distress. She has no wheezes. She has no rales.  Abdominal: Soft. She exhibits no distension. There is no tenderness.  Musculoskeletal: She exhibits no edema.  Neurological: She is alert and oriented to person, place, and time.  Skin: No rash noted.  Psychiatric: She has a normal mood and affect.  Nursing note and vitals reviewed.    ED Treatments / Results  Labs (all labs ordered are listed, but only abnormal results are displayed) Labs Reviewed  CBC - Abnormal; Notable for the following components:      Result Value   WBC 16.4 (*)    RBC 5.44 (*)    MCV 76.3 (*)    MCH 24.3 (*)    Platelets 479 (*)    All other components within normal limits  COMPREHENSIVE METABOLIC PANEL - Abnormal; Notable for the following components:   Sodium 129 (*)    Potassium 5.4 (*)    Chloride 100 (*)    CO2 15 (*)    Glucose, Bld 458 (*)    BUN 47 (*)    Creatinine, Ser 3.10 (*)    Albumin 3.4 (*)    GFR calc non Af Amer 16 (*)    GFR calc Af Amer 19 (*)    All other components within normal limits  D-DIMER, QUANTITATIVE (NOT AT [facility]) - Abnormal; Notable for the following components:   D-Dimer, Quant 3.01 (*)    All other components within normal limits  CBG MONITORING, ED - Abnormal; Notable for the following components:   Glucose-Capillary 379 (*)    All other components within normal limits    I-STAT CG4 LACTIC ACID, ED - Abnormal; Notable for the following components:   Lactic Acid, Venous 2.29 (*)    All other components within normal limits  CULTURE, BLOOD (ROUTINE X 2)  CULTURE, BLOOD (ROUTINE X 2)  LIPASE, BLOOD  LACTIC ACID, PLASMA  PROCALCITONIN  PROTIME-INR  APTT  URINALYSIS, ROUTINE W REFLEX MICROSCOPIC  LACTIC ACID, PLASMA  BASIC METABOLIC PANEL  I-STAT BETA HCG BLOOD, ED ([facility], [facility], [facility] ONLY)    EKG  EKG Interpretation None      Date: 01/03/2018  Rate: 106  Rhythm: normal sinus tachycardic  QRS Axis: normal  Intervals: normal  ST/T Wave abnormalities: normal  Conduction Disutrbances: none  Narrative Interpretation:   Old EKG Reviewed: No significant changes noted     Radiology Dg Chest 2 View  Result Date: 01/03/2018 CLINICAL DATA:  Shortness of breath. EXAM: CHEST  2 VIEW COMPARISON:  04/01/2007 FINDINGS: The heart size and mediastinal contours are within normal limits. Both lungs are clear. The visualized skeletal structures are unremarkable. IMPRESSION: No active cardiopulmonary disease. Electronically Signed   By: Misty Stanley M.D.   On: 01/03/2018 18:03    Procedures Procedures (including critical care time)  Medications Ordered in ED Medications  piperacillin-tazobactam (ZOSYN) IVPB 3.375 g (not administered)  insulin aspart (novoLOG) injection 5 Units (not administered)  ondansetron (ZOFRAN-ODT) disintegrating tablet 4 mg (4 mg Oral Given 01/03/18 1615)  sodium chloride 0.9 % bolus 1,000 mL (0 mLs Intravenous Stopped 01/03/18 1819)  ondansetron (ZOFRAN) injection 4 mg (4 mg Intravenous Given 01/03/18 1730)  sodium chloride 0.9 % bolus 1,000 mL (0 mLs Intravenous Stopped 01/03/18 1900)    And  sodium chloride 0.9 % bolus 1,000 mL (0 mLs Intravenous Stopped 01/03/18 1952)  piperacillin-tazobactam (ZOSYN) IVPB 3.375 g (0 g Intravenous Stopped 01/03/18 1858)     Initial Impression / Assessment and Plan / ED Course  I have reviewed the triage  vital signs and the nursing notes.  Pertinent labs & imaging results that were available during my care of the patient were reviewed by me and considered in my medical decision making (see chart for details).     BP 116/78   Pulse (!) 103   Temp 99.2 F (37.3 C) (Rectal)   Resp 17   Ht 5' 1.5" (1.562 m)   Wt 82.6 kg (182 lb)   LMP 12/20/2017 (Approximate)   SpO2 99%   BMI 33.83 kg/m    Final Clinical Impressions(s) / ED Diagnoses   Final diagnoses:  AKI (acute kidney injury) ([place])  Dehydration  Hyperglycemia    ED Discharge Orders    None     8:02 PM Patient here with nausea vomiting and is dehydrated.  Symptoms becoming progressively worse.  Labs are remarkable for a creatinine of 3.1, much higher than baseline, elevated white count of 16.4 and an elevated lactate of 2.29.  This is likely a reflection of dehydration however patient was given Zosyn for potential underlying infection causing her symptoms.  Chest x-ray shows no signs of pneumonia, unable to obtain a UA at this time due to her dehydration status.  She is receiving IV fluid currently.  She will receive 5 units of insulin  subcutaneous.  Appreciate consultation from tried hospitalist, Dr. Laren Everts who agrees to see and admit pt for further management of her dehydration and hyperglycemic state.  Pt agrees with plan.  Care discussed with Dr. Sherry Ruffing.    Domenic Moras, PA-C 01/03/18 2004    Tegeler, Gwenyth Allegra, MD 01/04/18 0157

## 2018-01-03 NOTE — Progress Notes (Addendum)
CRITICAL VALUE ALERT  Critical Value: Lactic acid- 2.3  Date & Time Notied: 1/31 2300  Provider Notified: Dr. Laren Everts  Orders Received/Actions taken: No new orders. Will continue to monitor.

## 2018-01-03 NOTE — H&P (Addendum)
[facility]                                                                                    Patient Demographics  Britlyn Martine, is a 52 y.o. female  CSN: [id number]  MRN: [id number]  DOB - Sep 25, 1966  Admit Date - 01/03/2018  Outpatient Primary MD for the patient is Mack Hook, MD   With History of -  Past Medical History:  Diagnosis Date  . Allergy   . Asthma   . Diabetes mellitus    type II  . Hyperlipidemia   . Hypertension   . Urticaria, chronic   . UTI (lower urinary tract infection)       Past Surgical History:  Procedure Laterality Date  . CATARACT EXTRACTION, BILATERAL Bilateral 2014  . SOFT TISSUE MASS EXCISION Right 2016   [place]  . TUBAL LIGATION  1995    in for   Chief Complaint  Patient presents with  . Emesis     HPI  Edithe Dobbin  is a 52 y.o. female, with past medical history significant for diabetes mellitus type 2, asthma, allergies and hypertension presenting with 2 weeks history of epigastric pain, lower back pain, decrease in appetite and intractable nausea and vomiting.  Patient denies any fever or chills.  Denies any chest pains or shortness of breath.  In the emergency room the patient was found to be hyperglycemic with no anion gap, dehydrated with acute renal insufficiency.  Patient was on metformin and ACE inhibitor.    Review of Systems    In addition to the HPI above,  No Fever-chills, No Headache, No changes with Vision or hearing, No problems swallowing food or Liquids, No Chest pain, Cough or Shortness of Breath, No Blood in stool or Urine, No dysuria, No new skin rashes or bruises, No new joints pains-aches,  No new weakness, tingling, numbness in any extremity, No recent weight gain or loss, No polyuria, polydypsia or polyphagia, No significant Mental Stressors.  A full 10 point Review of Systems was done, except as stated above, all other Review of Systems were  negative.   Social History Social History   Tobacco Use  . Smoking status: Never Smoker  . Smokeless tobacco: Never Used  Substance Use Topics  . Alcohol use: No     Family History Family History  Problem Relation Age of Onset  . Hyperlipidemia Other   . Heart disease Other   . Diabetes Father   . Hypertension Father   . COPD Father        Cause of death  . Colon cancer Mother   . Hypertension Mother   . Hypertension Sister   . Diabetes Sister   . Hypertension Brother   . Cancer Sister        Brain glioblastoma     Prior to Admission medications   Medication Sig Start Date End Date Taking? Authorizing Provider  aspirin 81 MG tablet Take 81 mg by mouth daily.   Yes [provider]  atorvastatin (LIPITOR) 20 MG tablet Take 1 tablet (20 mg total) by mouth daily. 12/18/17 12/18/18 Yes Mack Hook, MD  escitalopram (  LEXAPRO) 20 MG tablet Take 1 tablet (20 mg total) by mouth daily. 12/18/17  Yes Mack Hook, MD  glimepiride (AMARYL) 4 MG tablet Take 1 tablet (4 mg total) by mouth every morning. 12/18/17 12/18/18 Yes Mack Hook, MD  lisinopril-hydrochlorothiazide (PRINZIDE,ZESTORETIC) 20-12.5 MG tablet Take 1 tablet by mouth daily. 12/18/17  Yes Mack Hook, MD  metFORMIN (GLUCOPHAGE) 500 MG tablet Take 1 tablet (500 mg total) by mouth 2 (two) times daily with a meal. 12/18/17  Yes Mack Hook, MD  montelukast (SINGULAIR) 10 MG tablet Take 10 mg by mouth at bedtime.   Yes [provider]  sitaGLIPtin (JANUVIA) 100 MG tablet Take 1 tablet (100 mg total) by mouth daily. 10/17/11 01/03/18 Yes Biagio Borg, MD    Allergies  Allergen Reactions  . Actos [Pioglitazone Hydrochloride] Other (See Comments)    Wt gain    Physical Exam  Vitals  Blood pressure 116/78, pulse (!) 103, temperature 99.2 F (37.3 C), temperature source Rectal, resp. rate 17, height 5' 1.5" (1.562 m), weight 82.6 kg (182 lb), last menstrual period  12/20/2017, SpO2 99 %.   1. General well-developed, extremely pleasant female in no acute distress  2. Normal affect and insight, Not Suicidal or Homicidal, Awake Alert, Oriented X 3.  3. No F.N deficits, grossly, patient moving all extremities.  4. Ears and Eyes appear Normal, Conjunctivae clear, PERRLA.  Dry oral Mucosa.  5. Supple Neck, No JVD, No cervical lymphadenopathy appriciated, No Carotid Bruits.  6. Symmetrical Chest wall movement, Good air movement bilaterally, CTAB.  7. RRR, No Gallops, Rubs or Murmurs, No Parasternal Heave.  8. Positive Bowel Sounds, Abdomen Soft, Non tender, No organomegaly appriciated,No rebound -guarding or rigidity.  9.  No Cyanosis, Normal Skin Turgor, No Skin Rash or Bruise.  10. Good muscle tone,  joints appear normal , no effusions, Normal ROM.    Data Review  CBC Recent Labs  Lab 01/03/18 1655  WBC 16.4*  HGB 13.2  HCT 41.5  PLT 479*  MCV 76.3*  MCH 24.3*  MCHC 31.8  RDW 15.1   ------------------------------------------------------------------------------------------------------------------  Chemistries  Recent Labs  Lab 01/03/18 1555  NA 129*  K 5.4*  CL 100*  CO2 15*  GLUCOSE 458*  BUN 47*  CREATININE 3.10*  CALCIUM 9.0  AST 24  ALT 22  ALKPHOS 68  BILITOT 0.8   ------------------------------------------------------------------------------------------------------------------ estimated creatinine clearance is 21.4 mL/min (A) (by C-G formula based on SCr of 3.1 mg/dL (H)). ------------------------------------------------------------------------------------------------------------------ No results for input(s): TSH, T4TOTAL, T3FREE, THYROIDAB in the last 72 hours.  Invalid input(s): FREET3   Coagulation profile Recent Labs  Lab 01/03/18 1801  INR 1.02   ------------------------------------------------------------------------------------------------------------------- Recent Labs    01/03/18 1721   DDIMER 3.01*   -------------------------------------------------------------------------------------------------------------------  Cardiac Enzymes No results for input(s): CKMB, TROPONINI, MYOGLOBIN in the last 168 hours.  Invalid input(s): CK ------------------------------------------------------------------------------------------------------------------ Invalid input(s): POCBNP   ---------------------------------------------------------------------------------------------------------------  Urinalysis    Component Value Date/Time   COLORURINE LT. YELLOW 07/17/2011 [place] 07/17/2011 1041   LABSPEC 1.020 07/17/2011 1041   PHURINE 7.0 07/17/2011 1041   GLUCOSEU >=1000 07/17/2011 1041   HGBUR NEGATIVE 07/17/2011 1041   BILIRUBINUR NEG 07/31/2012 [place] 07/17/2011 1041   PROTEINUR NEG 07/31/2012 1015   UROBILINOGEN negative 07/31/2012 1015   UROBILINOGEN 1.0 07/17/2011 1041   NITRITE NEG 07/31/2012 1015   NITRITE NEGATIVE 07/17/2011 1041   LEUKOCYTESUR Negative 07/31/2012 1015    ----------------------------------------------------------------------------------------------------------------   Imaging results:  Dg Chest 2 View  Result Date: 01/03/2018 CLINICAL DATA:  Shortness of breath. EXAM: CHEST  2 VIEW COMPARISON:  04/01/2007 FINDINGS: The heart size and mediastinal contours are within normal limits. Both lungs are clear. The visualized skeletal structures are unremarkable. IMPRESSION: No active cardiopulmonary disease. Electronically Signed   By: Misty Stanley M.D.   On: 01/03/2018 18:03      Assessment & Plan  1.  Nonketotic, hyperglycemia 2.  Lactic acidosis 3.  Acute renal failure, patient is on ACE inhibitor/dehydration 4.  Leukocytosis  Plan  IV fluids DC metformin Insulin sliding scale Empiric IV antibiotics/cefepime    DVT Prophylaxis Heparin  AM Labs Ordered, also please review Full Orders  Family  Communication: Admission, patients condition and plan of care including tests being ordered have been discussed with the patient and daughter who indicate understanding and agree with the plan and Code Status.  Code Status full  Disposition Plan: Home  Time spent in minutes : 46 minutes  Condition GUARDED   @SIGNATURE @

## 2018-01-03 NOTE — Progress Notes (Addendum)
Pharmacy Antibiotic Note  Holly Ortiz is a 52 y.o. female admitted on 01/03/2018 with sepsis.  Pharmacy has been consulted for vancomycin and cefepime dosing. Pt is afebrile but WBC is elevated at 16.4 and lactic acid is elevated at 2.29. SCr is well above baseline at 3.1.   Plan: Vancomycin 1500mg  IV x 1 - f/u Scr trend for further doses Cefepime 1gm IV Q24h F/u renal fxn, C&S, clinical status and trough at SS  Height: 5' 1.5" (156.2 cm) Weight: 182 lb (82.6 kg) IBW/kg (Calculated) : 48.95  Temp (24hrs), Avg:98.5 F (36.9 C), Min:97.7 F (36.5 C), Max:99.2 F (37.3 C)  Recent Labs  Lab 01/03/18 1655 01/03/18 1739  WBC 16.4*  --   LATICACIDVEN  --  2.29*    Estimated Creatinine Clearance: 82.9 mL/min (A) (by C-G formula based on SCr of 0.47 mg/dL (L)).    Allergies  Allergen Reactions  . Actos [Pioglitazone Hydrochloride] Other (See Comments)    Wt gain    Antimicrobials this admission: Vanc 1/31>> Cefepime 2/1>> Zosyn x 1 1/31  Dose adjustments this admission: N/A  Microbiology results: Penidng  Thank you for allowing pharmacy to be a part of this patient's care.  Dannel Rafter, Rande Lawman 01/03/2018 6:07 PM

## 2018-01-03 NOTE — ED Triage Notes (Addendum)
Pt presents with 2 week h/o nausea, vomiting and diarrhea.  Pt unable to keep fluids down, denies any abdominal pain.  Reports CBG at home today was 592.  Pt reports shortness of breath and weakness especially with exertion.

## 2018-01-03 NOTE — ED Notes (Signed)
CODE SEPSIS ACTIVATED RN MAGGIE AWARE

## 2018-01-04 ENCOUNTER — Inpatient Hospital Stay (HOSPITAL_COMMUNITY): Payer: Self-pay

## 2018-01-04 DIAGNOSIS — E785 Hyperlipidemia, unspecified: Secondary | ICD-10-CM

## 2018-01-04 DIAGNOSIS — E860 Dehydration: Secondary | ICD-10-CM

## 2018-01-04 DIAGNOSIS — E870 Hyperosmolality and hypernatremia: Secondary | ICD-10-CM

## 2018-01-04 DIAGNOSIS — R739 Hyperglycemia, unspecified: Secondary | ICD-10-CM

## 2018-01-04 DIAGNOSIS — N179 Acute kidney failure, unspecified: Secondary | ICD-10-CM | POA: Diagnosis present

## 2018-01-04 LAB — GLUCOSE, CAPILLARY
GLUCOSE-CAPILLARY: 150 mg/dL — AB (ref 65–99)
GLUCOSE-CAPILLARY: 305 mg/dL — AB (ref 65–99)
Glucose-Capillary: 186 mg/dL — ABNORMAL HIGH (ref 65–99)
Glucose-Capillary: 222 mg/dL — ABNORMAL HIGH (ref 65–99)

## 2018-01-04 LAB — CBC
HCT: 32.9 % — ABNORMAL LOW (ref 36.0–46.0)
HEMOGLOBIN: 10.2 g/dL — AB (ref 12.0–15.0)
MCH: 23.5 pg — AB (ref 26.0–34.0)
MCHC: 31 g/dL (ref 30.0–36.0)
MCV: 75.8 fL — AB (ref 78.0–100.0)
Platelets: 336 10*3/uL (ref 150–400)
RBC: 4.34 MIL/uL (ref 3.87–5.11)
RDW: 15.1 % (ref 11.5–15.5)
WBC: 8.8 10*3/uL (ref 4.0–10.5)

## 2018-01-04 LAB — HEMOGLOBIN A1C
HEMOGLOBIN A1C: 12.1 % — AB (ref 4.8–5.6)
MEAN PLASMA GLUCOSE: 300.57 mg/dL

## 2018-01-04 LAB — BASIC METABOLIC PANEL
Anion gap: 8 (ref 5–15)
BUN: 39 mg/dL — AB (ref 6–20)
CO2: 17 mmol/L — AB (ref 22–32)
CREATININE: 1.59 mg/dL — AB (ref 0.44–1.00)
Calcium: 8 mg/dL — ABNORMAL LOW (ref 8.9–10.3)
Chloride: 112 mmol/L — ABNORMAL HIGH (ref 101–111)
GFR calc Af Amer: 43 mL/min — ABNORMAL LOW (ref >60)
GFR calc non Af Amer: 37 mL/min — ABNORMAL LOW (ref >60)
GLUCOSE: 217 mg/dL — AB (ref 65–99)
Potassium: 3.6 mmol/L (ref 3.5–5.1)
SODIUM: 137 mmol/L (ref 135–145)

## 2018-01-04 LAB — PROCALCITONIN: Procalcitonin: <0.1 ng/mL

## 2018-01-04 LAB — HIV ANTIBODY (ROUTINE TESTING W REFLEX): HIV Screen 4th Generation wRfx: NONREACTIVE

## 2018-01-04 LAB — INFLUENZA PANEL BY PCR (TYPE A & B)
Influenza A By PCR: NEGATIVE
Influenza B By PCR: NEGATIVE

## 2018-01-04 MED ORDER — INSULIN STARTER KIT- SYRINGES (ENGLISH)
1.0000 | Freq: Once | Status: AC
  Administered 2018-01-04: 1
  Filled 2018-01-04: qty 1

## 2018-01-04 MED ORDER — FAMOTIDINE IN NACL 20-0.9 MG/50ML-% IV SOLN
20.0000 mg | Freq: Two times a day (BID) | INTRAVENOUS | Status: DC
  Administered 2018-01-04 – 2018-01-05 (×2): 20 mg via INTRAVENOUS
  Filled 2018-01-04 (×2): qty 50

## 2018-01-04 MED ORDER — VANCOMYCIN HCL IN DEXTROSE 750-5 MG/150ML-% IV SOLN
750.0000 mg | Freq: Two times a day (BID) | INTRAVENOUS | Status: DC
  Filled 2018-01-04: qty 150

## 2018-01-04 MED ORDER — INSULIN GLARGINE 100 UNIT/ML ~~LOC~~ SOLN
13.0000 [IU] | Freq: Every day | SUBCUTANEOUS | Status: DC
  Filled 2018-01-04: qty 0.13

## 2018-01-04 MED ORDER — INSULIN GLARGINE 100 UNIT/ML ~~LOC~~ SOLN
10.0000 [IU] | Freq: Every day | SUBCUTANEOUS | Status: DC
Start: 2018-01-04 — End: 2018-01-04
  Administered 2018-01-04: 10 [IU] via SUBCUTANEOUS
  Filled 2018-01-04: qty 0.1

## 2018-01-04 MED ORDER — TECHNETIUM TC 99M DIETHYLENETRIAME-PENTAACETIC ACID
4.0000 | Freq: Once | INTRAVENOUS | Status: AC | PRN
  Administered 2018-01-04: 4 via INTRAVENOUS

## 2018-01-04 MED ORDER — TECHNETIUM TO 99M ALBUMIN AGGREGATED
4.0000 | Freq: Once | INTRAVENOUS | Status: AC | PRN
  Administered 2018-01-04: 4 via INTRAVENOUS

## 2018-01-04 MED ORDER — DIPHENHYDRAMINE HCL 50 MG/ML IJ SOLN
25.0000 mg | Freq: Once | INTRAMUSCULAR | Status: AC
  Administered 2018-01-04: 25 mg via INTRAVENOUS
  Filled 2018-01-04: qty 1

## 2018-01-04 MED ORDER — METHYLPREDNISOLONE SODIUM SUCC 125 MG IJ SOLR: 125.0000 mg | Freq: Once | INTRAMUSCULAR | Status: DC

## 2018-01-04 MED ORDER — DIPHENHYDRAMINE HCL 50 MG/ML IJ SOLN: 25.0000 mg | Freq: Once | INTRAMUSCULAR | Status: DC

## 2018-01-04 MED ORDER — INSULIN GLARGINE 100 UNIT/ML ~~LOC~~ SOLN
3.0000 [IU] | Freq: Once | SUBCUTANEOUS | Status: AC
  Administered 2018-01-04: 3 [IU] via SUBCUTANEOUS
  Filled 2018-01-04: qty 0.03

## 2018-01-04 MED ORDER — METHYLPREDNISOLONE SODIUM SUCC 125 MG IJ SOLR
125.0000 mg | Freq: Once | INTRAMUSCULAR | Status: AC
  Administered 2018-01-04: 125 mg via INTRAVENOUS
  Filled 2018-01-04: qty 2

## 2018-01-04 MED ORDER — INSULIN ASPART 100 UNIT/ML ~~LOC~~ SOLN
3.0000 [IU] | Freq: Three times a day (TID) | SUBCUTANEOUS | Status: DC
  Administered 2018-01-04 (×2): 3 [IU] via SUBCUTANEOUS

## 2018-01-04 MED ORDER — GLUCERNA SHAKE PO LIQD
237.0000 mL | Freq: Two times a day (BID) | ORAL | Status: DC
  Administered 2018-01-04: 237 mL via ORAL

## 2018-01-04 MED ORDER — DIPHENHYDRAMINE HCL 50 MG/ML IJ SOLN: 25.0000 mg | Freq: Four times a day (QID) | INTRAMUSCULAR | Status: DC | PRN

## 2018-01-04 NOTE — Progress Notes (Signed)
[facility]                                                                              Patient Demographics  Holly Ortiz, is a 52 y.o. female, DOB - [birth year], SEG:[id number]  Admit date - 01/03/2018   Admitting Physician Merton Border, MD  Outpatient Primary MD for the patient is Mack Hook, MD  Outpatient specialists:   LOS - 1  days   Medical records reviewed and are as summarized below:    Chief Complaint  Patient presents with  . Emesis       Brief summary   Patient is a 52 year old female with uncontrolled diabetes, type II, asthma, allergies, hypertension presented with 2-week history of weight complaints including epigastric pain, lower back pain, decrease in appetite, nausea vomiting, diarrhea, URI.  No fevers or chills.  Patient reported that she was unable to keep fluids down, CBG at home was 592, felt weak. Creatinine 3.1, lactic 2.29, white count 16.4  Assessment & Plan    Principal Problem:   Hyperosmolar hyperglycemia in the setting of uncontrolled diabetes mellitus type 2 -Hemoglobin A1c 13.2 on 12/18/17, repeated today 12.1 -Will likely need insulin at the time of discharge, patient states that she used to be on FlexPen before -CBGs improving, continue IV fluid hydration -Placed on Lantus 10 units, NovoLog meal coverage 3 units 3 times daily AC, sliding scale insulin -Nutrition consult, diabetic coordinator consult   Active Problems: Lactic acidosis with leukocytosis -Patient had significant dehydration at the time of admission, acute kidney injury, on metformin, possibly could have lactic acidosis from dehydration -Patient was placed on IV vancomycin and Zosyn. -WBC count 16.4 at the time of admission possibly due to stress demargination -UA negative for UTI, chest x-ray negative for pneumonia -If no fevers for next 24 hours, DC antibiotics in a.m., DC'ed vancomycin    Hyperlipidemia -Continue Lipitor      Hypertension -BP soft, hold lisinopril HCTZ    AKI (acute kidney injury) ([place]) -Creatinine 3.1 at the time of admission.  Likely due to #1, profound dehydration due to intractable nausea and vomiting, medications -Hold HCTZ lisinopril -Continue IV fluid hydration, creatinine improving   Hyperkalemia -Likely due to HONK, improving  Code Status: Full code DVT Prophylaxis: Heparin subcu Family Communication: Discussed in detail with the patient, all imaging results, lab results explained to the patient   Disposition Plan: Possible DC home in a.m.  Time Spent in minutes   35 minutes  Procedures:    Consultants:     Antimicrobials:      Medications  Scheduled Meds: . aspirin  81 mg Oral Daily  . atorvastatin  20 mg Oral Daily  . escitalopram  20 mg Oral Daily  . heparin  5,000 Units Subcutaneous Q8H  . Influenza vac split quadrivalent PF  0.5 mL Intramuscular Tomorrow-1000  . insulin aspart  0-15 Units Subcutaneous TID WC  . insulin aspart  0-5 Units Subcutaneous QHS  . insulin aspart  3 Units Subcutaneous TID WC  . insulin glargine  10 Units Subcutaneous Daily  . pneumococcal 23 valent vaccine  0.5 mL Intramuscular Tomorrow-1000  Continuous Infusions: . sodium chloride 100 mL/hr at 01/03/18 2158  . ceFEPime (MAXIPIME) IV Stopped (01/04/18 0100)  . vancomycin     PRN Meds:.   Antibiotics   Anti-infectives (From admission, onward)   Start     Dose/Rate Route Frequency Ordered Stop   01/04/18 1300  vancomycin (VANCOCIN) IVPB 750 mg/150 ml premix     750 mg 150 mL/hr over 60 Minutes Intravenous Every 12 hours 01/04/18 1136     01/04/18 0200  piperacillin-tazobactam (ZOSYN) IVPB 3.375 g  Status:  Discontinued     3.375 g 12.5 mL/hr over 240 Minutes Intravenous Every 8 hours 01/03/18 1824 01/03/18 2058   01/04/18 0100  ceFEPIme (MAXIPIME) 1 g in dextrose 5 % 50 mL IVPB     1 g 100 mL/hr over 30 Minutes Intravenous Every 24 hours 01/03/18 2058     01/03/18 1815   piperacillin-tazobactam (ZOSYN) IVPB 3.375 g     3.375 g 100 mL/hr over 30 Minutes Intravenous  Once 01/03/18 1802 01/03/18 1858   01/03/18 1815  vancomycin (VANCOCIN) IVPB 1000 mg/200 mL premix  Status:  Discontinued     1,000 mg 200 mL/hr over 60 Minutes Intravenous  Once 01/03/18 1802 01/03/18 1805   01/03/18 1815  vancomycin (VANCOCIN) 1,500 mg in sodium chloride 0.9 % 500 mL IVPB  Status:  Discontinued     1,500 mg 250 mL/hr over 120 Minutes Intravenous  Once 01/03/18 1805 01/03/18 2000        Subjective:   Holly Ortiz was seen and examined today.  Feeling a lot better today.  Nausea vomiting improving.  Patient denies dizziness, chest pain, shortness of breath, abdominal pain, D/C, new weakness, numbess, tingling. No acute events overnight.    Objective:   Vitals:   01/03/18 2030 01/03/18 2109 01/04/18 0628 01/04/18 0745  BP: (!) 105/53 112/63 103/61 122/71  Pulse: (!) 110 (!) 111 (!) 108 82  Resp: 18  16 20   Temp:  98.6 F (37 C) 98 F (36.7 C) 97.8 F (36.6 C)  TempSrc:  Oral Oral Oral  SpO2: 100% 100% 100% 98%  Weight:  83.7 kg (184 lb 8 oz)    Height:  5' 1.5" (1.562 m)      Intake/Output Summary (Last 24 hours) at 01/04/2018 1204 Last data filed at 01/04/2018 0300 Gross per 24 hour  Intake 3603.33 ml  Output -  Net 3603.33 ml     Wt Readings from Last 3 Encounters:  01/03/18 83.7 kg (184 lb 8 oz)  12/18/17 82.6 kg (182 lb)  08/13/12 86.2 kg (190 lb)     Exam  General: Alert and oriented x 3, NAD  Eyes:   HEENT:  Atraumatic, normocephalic,   Cardiovascular: S1 S2 auscultated, no rubs, murmurs or gallops. Regular rate and rhythm.  Respiratory: Clear to auscultation bilaterally, no wheezing, rales or rhonchi  Gastrointestinal: Soft, nontender, nondistended, + bowel sounds  Ext: no pedal edema bilaterally  Neuro: AAOx3, Cr N's II- XII. Strength 5/5 upper and lower extremities bilaterally,  Musculoskeletal: No digital cyanosis,  clubbing  Skin: No rashes  Psych: Normal affect and demeanor, alert and oriented x3    Data Reviewed:  I have personally reviewed following labs and imaging studies  Micro Results No results found for this or any previous visit (from the past 240 hour(s)).  Radiology Reports Dg Chest 2 View  Result Date: 01/03/2018 CLINICAL DATA:  Shortness of breath. EXAM: CHEST  2 VIEW COMPARISON:  04/01/2007 FINDINGS: The heart  size and mediastinal contours are within normal limits. Both lungs are clear. The visualized skeletal structures are unremarkable. IMPRESSION: No active cardiopulmonary disease. Electronically Signed   By: Misty Stanley M.D.   On: 01/03/2018 18:03    Lab Data:  CBC: Recent Labs  Lab 01/03/18 1655 01/04/18 0304  WBC 16.4* 8.8  HGB 13.2 10.2*  HCT 41.5 32.9*  MCV 76.3* 75.8*  PLT 479* 376   Basic Metabolic Panel: Recent Labs  Lab 01/03/18 1555 01/04/18 0304  NA 129* 137  K 5.4* 3.6  CL 100* 112*  CO2 15* 17*  GLUCOSE 458* 217*  BUN 47* 39*  CREATININE 3.10* 1.59*  CALCIUM 9.0 8.0*   GFR: Estimated Creatinine Clearance: 42 mL/min (A) (by C-G formula based on SCr of 1.59 mg/dL (H)). Liver Function Tests: Recent Labs  Lab 01/03/18 1555  AST 24  ALT 22  ALKPHOS 68  BILITOT 0.8  PROT 7.0  ALBUMIN 3.4*   Recent Labs  Lab 01/03/18 1555  LIPASE 34   No results for input(s): AMMONIA in the last 168 hours. Coagulation Profile: Recent Labs  Lab 01/03/18 1801  INR 1.02   Cardiac Enzymes: No results for input(s): CKTOTAL, CKMB, CKMBINDEX, TROPONINI in the last 168 hours. BNP (last 3 results) No results for input(s): PROBNP in the last 8760 hours. HbA1C: Recent Labs    01/04/18 0304  HGBA1C 12.1*   CBG: Recent Labs  Lab 01/03/18 1625 01/03/18 2001 01/03/18 2114 01/04/18 0632 01/04/18 1153  GLUCAP 379* 431* 324* 150* 186*   Lipid Profile: No results for input(s): CHOL, HDL, LDLCALC, TRIG, CHOLHDL, LDLDIRECT in the last 72  hours. Thyroid Function Tests: No results for input(s): TSH, T4TOTAL, FREET4, T3FREE, THYROIDAB in the last 72 hours. Anemia Panel: No results for input(s): VITAMINB12, FOLATE, FERRITIN, TIBC, IRON, RETICCTPCT in the last 72 hours. Urine analysis:    Component Value Date/Time   COLORURINE YELLOW 01/03/2018 1721   APPEARANCEUR HAZY (A) 01/03/2018 1721   LABSPEC 1.018 01/03/2018 1721   PHURINE 5.0 01/03/2018 1721   GLUCOSEU 50 (A) 01/03/2018 1721   GLUCOSEU >=1000 07/17/2011 1041   HGBUR NEGATIVE 01/03/2018 1721   BILIRUBINUR NEGATIVE 01/03/2018 1721   BILIRUBINUR NEG 07/31/2012 1015   KETONESUR NEGATIVE 01/03/2018 1721   PROTEINUR NEGATIVE 01/03/2018 1721   UROBILINOGEN negative 07/31/2012 1015   UROBILINOGEN 1.0 07/17/2011 1041   NITRITE NEGATIVE 01/03/2018 1721   LEUKOCYTESUR TRACE (A) 01/03/2018 1721     Trisha Ken M.D. [facility] 01/04/2018, 12:04 PM  Pager: [phone] Between 7am to 7pm - call Pager - [phone]  After 7pm go to [email or web address] - password [email or web address]  Call night coverage person covering after 7pm

## 2018-01-04 NOTE — Progress Notes (Signed)
Inpatient Diabetes Program Recommendations  AACE/ADA: New Consensus Statement on Inpatient Glycemic Control (2015)  Target Ranges:  Prepandial:   less than 140 mg/dL      Peak postprandial:   less than 180 mg/dL (1-2 hours)      Critically ill patients:  140 - 180 mg/dL   Lab Results  Component Value Date   GLUCAP 186 (H) 01/04/2018   HGBA1C 12.1 (H) 01/04/2018    Review of Glycemic Control Results for Holly Ortiz, Holly Ortiz (MRN [id number]) as of 01/04/2018 14:16  Ref. Range 01/03/2018 16:25 01/03/2018 20:01 01/03/2018 21:14 01/04/2018 06:32 01/04/2018 11:53  Glucose-Capillary Latest Ref Range: 65 - 99 mg/dL 379 (H) 431 (H) 324 (H) 150 (H) 186 (H)   Diabetes history: Type 2 DM Outpatient Diabetes medications: Januvia 100 mg QD, Metformin 500 mg BID, Amaryl 4 mg in AM Current orders for Inpatient glycemic control: Novolog 0-5 Units QHS, Novolog 0-15 Units TID, Novolog 3 units TID, Lantus 10 Units QD  Inpatient Diabetes Program Recommendations:    Spoke with patient and verified home dosages of home medications for diabetes management. Patient has access to pick up her medications, however, insulin will "change things". Patient has been on insulin before, but had to stop because of financial implications.  Reviewed patient's current A1c of 12.%. Explained what a A1c is and what it measures. Also reviewed goal A1c with patient, importance of good glucose control @ home, and blood sugar goals. Discussed long term co-morbidies that comes from poor control. Also, discussed survival skills (hypoglycemia, hyperglycemia and what interventions to perform). Reviewed the use of insulin pens and insulin syringes with vial. Patient verbalizes comfortability with either option, especially if cost will be an issue. At this time, will place order for case management to review options for [facility].  Please consider Novolin 70/30- 11 Units BID as an option for this patient. We did discuss that she  could pick up this insulin over the counter at [facility] for a cost of 25$ per vial. [name].  Thanks, Bronson Curb, MSN, RNC-OB Diabetes Coordinator [phone] (8a-5p)

## 2018-01-04 NOTE — Progress Notes (Addendum)
Initial Nutrition Assessment  DOCUMENTATION CODES:   Obesity unspecified  INTERVENTION:    Glucerna Shake po BID, each supplement provides 220 kcal and 10 grams of protein  NUTRITION DIAGNOSIS:   Inadequate oral intake related to acute illness, decreased appetite as evidenced by (chart review)  GOAL:   Patient will meet greater than or equal to 90% of their needs  MONITOR:   PO intake, Supplement acceptance, Labs, Skin, Weight trends  REASON FOR ASSESSMENT:   Consult Assessment of nutrition requirement/status  ASSESSMENT:   52 y.o. Female with PMH significant for diabetes mellitus type 2, asthma, allergies and hypertension presenting with 2 weeks history of epigastric pain, lower back pain, decrease in appetite and intractable nausea and vomiting.  Pt leaving for Nuclear Medicine upon RD arrival. No % PO intake records available per flowsheet records. Per H&P pt with decreased appetite, N/V. Labs and medications reviewed. CBG's [id number].  NUTRITION - FOCUSED PHYSICAL EXAM:  Unable to complete at this time.  Diet Order:  Diet Carb Modified Fluid consistency: Thin; Room service appropriate? Yes  EDUCATION NEEDS:   No education needs have been identified at this time  Skin:  Skin Assessment: Reviewed RN Assessment  Last BM:  1/31  Height:   Ht Readings from Last 1 Encounters:  01/03/18 5' 1.5" (1.562 m)    Weight:   Wt Readings from Last 1 Encounters:  01/03/18 184 lb 8 oz (83.7 kg)    Ideal Body Weight:  47.7 kg  BMI:  Body mass index is 34.3 kg/m.  Estimated Nutritional Needs:   Kcal:  1900-2100  Protein:  90-105 gm  Fluid:  1.9-2.1 L  Arthur Holms, RD, LDN Pager #: [phone] After-Hours Pager #: [phone]

## 2018-01-04 NOTE — Care Management Note (Signed)
Case Management Note Marvetta Gibbons RN, BSN Unit 4E-Case Manager [phone]  Patient Details  Name: Raphael Espe MRN: [id number] Date of Birth: 1966-04-14  Subjective/Objective:    Pt admitted with hyperglycemia                Action/Plan: PTA pt lived at home- independent- recently moved here from [place]- has been to one visit with PCP- DR. Mulberry- spoke with pt at bedside regarding [place]- pt open to changing - however call made to clinic today and no f/u appointment available- pt provided a list of clinics in area that see uninsured patients along with contact # for [facility] and advised to call on Monday to see if they have any appointments open. CM will continue to follow for any potential medication needs on discharge.   Expected Discharge Date:  01/04/18               Expected Discharge Plan:  Home/Self Care  In-House Referral:     Discharge planning Services  CM Consult, Medication Assistance, [facility]  Post Acute Care Choice:    Choice offered to:     DME Arranged:    DME Agency:     HH Arranged:    HH Agency:     Status of Service:  In process, will continue to follow  If discussed at Long Length of Stay Meetings, dates discussed:    Discharge Disposition:   Additional Comments:  Dawayne Patricia, RN 01/04/2018, 4:00 PM

## 2018-01-05 DIAGNOSIS — I10 Essential (primary) hypertension: Secondary | ICD-10-CM

## 2018-01-05 LAB — CBC
HCT: 29.2 % — ABNORMAL LOW (ref 36.0–46.0)
HEMOGLOBIN: 9 g/dL — AB (ref 12.0–15.0)
MCH: 23.7 pg — AB (ref 26.0–34.0)
MCHC: 30.8 g/dL (ref 30.0–36.0)
MCV: 77 fL — AB (ref 78.0–100.0)
Platelets: 281 10*3/uL (ref 150–400)
RBC: 3.79 MIL/uL — AB (ref 3.87–5.11)
RDW: 15.4 % (ref 11.5–15.5)
WBC: 5.5 10*3/uL (ref 4.0–10.5)

## 2018-01-05 LAB — BASIC METABOLIC PANEL
Anion gap: 7 (ref 5–15)
BUN: 27 mg/dL — AB (ref 6–20)
CHLORIDE: 108 mmol/L (ref 101–111)
CO2: 18 mmol/L — ABNORMAL LOW (ref 22–32)
CREATININE: 0.86 mg/dL (ref 0.44–1.00)
Calcium: 8 mg/dL — ABNORMAL LOW (ref 8.9–10.3)
GFR calc Af Amer: >60 mL/min (ref >60)
GFR calc non Af Amer: >60 mL/min (ref >60)
GLUCOSE: 556 mg/dL — AB (ref 65–99)
POTASSIUM: 4.8 mmol/L (ref 3.5–5.1)
SODIUM: 133 mmol/L — AB (ref 135–145)

## 2018-01-05 LAB — GLUCOSE, CAPILLARY
GLUCOSE-CAPILLARY: 359 mg/dL — AB (ref 65–99)
Glucose-Capillary: 293 mg/dL — ABNORMAL HIGH (ref 65–99)

## 2018-01-05 MED ORDER — LISINOPRIL-HYDROCHLOROTHIAZIDE 20-12.5 MG PO TABS: 1.0000 | ORAL_TABLET | Freq: Every day | ORAL | 3 refills | Status: DC

## 2018-01-05 MED ORDER — ONDANSETRON 4 MG PO TBDP: 4.0000 mg | ORAL_TABLET | Freq: Three times a day (TID) | ORAL | 0 refills | Status: DC | PRN

## 2018-01-05 MED ORDER — INSULIN ASPART 100 UNIT/ML ~~LOC~~ SOLN
7.0000 [IU] | Freq: Once | SUBCUTANEOUS | Status: AC
  Administered 2018-01-05: 7 [IU] via SUBCUTANEOUS

## 2018-01-05 MED ORDER — INSULIN ASPART 100 UNIT/ML IV SOLN: 7.0000 [IU] | Freq: Once | INTRAVENOUS | Status: DC

## 2018-01-05 MED ORDER — INSULIN NPH ISOPHANE & REGULAR (70-30) 100 UNIT/ML ~~LOC~~ SUSP: 11.0000 [IU] | Freq: Two times a day (BID) | SUBCUTANEOUS | 3 refills | Status: DC

## 2018-01-05 MED ORDER — INSULIN GLARGINE 100 UNIT/ML ~~LOC~~ SOLN
15.0000 [IU] | Freq: Every day | SUBCUTANEOUS | Status: DC
  Administered 2018-01-05: 15 [IU] via SUBCUTANEOUS
  Filled 2018-01-05: qty 0.15

## 2018-01-05 MED ORDER — INSULIN ASPART 100 UNIT/ML ~~LOC~~ SOLN
5.0000 [IU] | Freq: Three times a day (TID) | SUBCUTANEOUS | Status: DC
  Administered 2018-01-05 (×2): 5 [IU] via SUBCUTANEOUS

## 2018-01-05 MED ORDER — HYDROCHLOROTHIAZIDE 12.5 MG PO TABS: 12.5000 mg | ORAL_TABLET | Freq: Every day | ORAL | 3 refills | Status: DC

## 2018-01-05 MED ORDER — INSULIN SYRINGES (DISPOSABLE) U-100 0.5 ML MISC: 2 refills | Status: DC

## 2018-01-05 NOTE — Progress Notes (Signed)
Text page [facility] Critical lab value Glucose 556.

## 2018-01-05 NOTE — Progress Notes (Signed)
Pt discharged home with family. IV and telemetry box removed. Pt received discharge instructions and all questions were answered. Pt received paper prescriptions and was instructed to get them filled at her pharmacy. Pt verbalized understanding. Pt ambulated off the unit, per request, and was accompanied by family.    Grant Fontana BSN, RN

## 2018-01-05 NOTE — Care Management (Signed)
Spoke with patient regarding 3 prescriptions on AVS.  Pt states she can afford Insulin and syringes and will have filled at [facility].  Checked Good RX for Zofran- generic is $12 at [facility].  Pt states this is affordable and no assistance needed.  Pt has transportation to pharmacy.  No further CM needs at this time.

## 2018-01-05 NOTE — Discharge Summary (Signed)
Physician Discharge Summary   Patient ID: Holly Ortiz MRN: [id number] DOB/AGE: [birth year] 52 y.o.  Admit date: 01/03/2018 Discharge date: 01/05/2018  Primary Care Physician:  Mack Hook, MD  Discharge Diagnoses:    . Hypertension . Hyperlipidemia . Hyperosmolar hyperglycemia . AKI (acute kidney injury) ([place]) Uncontrolled diabetes mellitus Angioedema, resolved  Consults: Diabetic coordinator   Recommendations for Outpatient Follow-up:  1. Please repeat CBC/BMET at next visit 2. Patient started on Novolin 70/30  11 units twice a day 3. Lisinopril discontinued    DIET: Carb modified diet    Allergies:   Allergies  Allergen Reactions  . Actos [Pioglitazone Hydrochloride] Other (See Comments)    Wt gain     DISCHARGE MEDICATIONS: Allergies as of 01/05/2018      Reactions   Actos [pioglitazone Hydrochloride] Other (See Comments)   Wt gain      Medication List    STOP taking these medications   glimepiride 4 MG tablet Commonly known as:  AMARYL   lisinopril-hydrochlorothiazide 20-12.5 MG tablet Commonly known as:  PRINZIDE,ZESTORETIC   metFORMIN 500 MG tablet Commonly known as:  GLUCOPHAGE   sitaGLIPtin 100 MG tablet Commonly known as:  JANUVIA     TAKE these medications   aspirin 81 MG tablet Take 81 mg by mouth daily.   atorvastatin 20 MG tablet Commonly known as:  LIPITOR Take 1 tablet (20 mg total) by mouth daily.   escitalopram 20 MG tablet Commonly known as:  LEXAPRO Take 1 tablet (20 mg total) by mouth daily.   hydrochlorothiazide 12.5 MG tablet Commonly known as:  HYDRODIURIL Take 1 tablet (12.5 mg total) by mouth daily.   insulin NPH-regular Human (70-30) 100 UNIT/ML injection Commonly known as:  NOVOLIN 70/30 Inject 11 Units into the skin 2 (two) times daily with a meal.   Insulin Syringes (Disposable) U-100 0.5 ML Misc Take as directed with your insulin regimen   montelukast 10 MG tablet Commonly known as:   SINGULAIR Take 10 mg by mouth at bedtime.   ondansetron 4 MG disintegrating tablet Commonly known as:  ZOFRAN ODT Take 1 tablet (4 mg total) by mouth every 8 (eight) hours as needed for nausea or vomiting.        Brief H and P: For complete details please refer to admission H and P, but in brief Patient is a 52 year old female with uncontrolled diabetes, type II, asthma, allergies, hypertension presented with 2-week history of weight complaints including epigastric pain, lower back pain, decrease in appetite, nausea vomiting, diarrhea, URI.  No fevers or chills.  Patient reported that she was unable to keep fluids down, CBG at home was 592, felt weak. Creatinine 3.1, lactic 2.29, white count 16.4   Hospital Course:   Hyperosmolar hyperglycemia in the setting of uncontrolled diabetes mellitus type 2 -Hemoglobin A1c 13.2 on 12/18/17.  12.1 on 01/04/18 -Patient was placed on IV fluid hydration, Lantus, NovoLog meal coverage with a sliding scale insulin while inpatient. -She received education from diabetic coordinator and nutrition. -Her CBGs had significantly improved however subsequently patient had an episode of angioedema on 01/04/18 and received Solu-Medrol 125 mg IV x1 which increased her CBGs next morning as is expected. -Now improving, patient recommended to take Novolin 70/30 13 units twice a day at home until CBGs improved and consistently below 140s when she can change to 11 units twice a day.  Patient seems to be very educated about insulin, states that he had been using flex pens in the past.  She  will check her blood sugars 4 times a day for the next few days until CBGs stabilize.   Lactic acidosis with leukocytosis -Patient had significant dehydration at the time of admission, acute kidney injury, on metformin, possibly could have lactic acidosis from dehydration -Patient was placed on IV vancomycin and Zosyn. -WBC count 16.4 at the time of admission possibly due to stress  demargination -UA negative for UTI, chest x-ray negative for pneumonia -No fevers, WBC count 5.5, discontinued antibiotics.  Acute episode of angioedema -On 2/1 evening, patient reported angioedema with swelling of the lips, no airway compromise. Unclear etiology however patient-stated that this has been chronic and she has seen allergy immunology in the past with no trigger identified. -Noticed that patient is on ACE inhibitors for years and upon further questioning, patient reported that her symptoms started around when lisinopril was initiated -Discontinued lisinopril, explained to the patient to monitor her symptoms of ACE inhibitors.  Stay on HCTZ.    Hyperlipidemia -Continue Lipitor    Hypertension -BP soft, continue to hold HCTZ, restart in 1 or 2 days -Lisinopril discontinued    AKI (acute kidney injury) ([place]) -Creatinine 3.1 at the time of admission.  Likely due to #1, profound dehydration due to intractable nausea and vomiting, medications -Creatinine has improved, 0.86 at the time of discharge. Lisinopril discontinued, may restart HCTZ in a couple of days-   Hyperkalemia -Likely due to HONK, improved     Day of Discharge BP 116/72 (BP Location: Right Arm)   Pulse (!) 103   Temp 97.9 F (36.6 C) (Oral)   Resp 16   Ht 5' 1.5" (1.562 m)   Wt 86 kg (189 lb 8 oz)   LMP 12/20/2017 (Approximate)   SpO2 97%   BMI 35.23 kg/m   Physical Exam: General: Alert and awake oriented x3 not in any acute distress. HEENT: anicteric sclera, pupils reactive to light and accommodation CVS: S1-S2 clear no murmur rubs or gallops Chest: clear to auscultation bilaterally, no wheezing rales or rhonchi Abdomen: soft nontender, nondistended, normal bowel sounds Extremities: no cyanosis, clubbing or edema noted bilaterally Neuro: Cranial nerves II-XII intact, no focal neurological deficits   The results of significant diagnostics from this hospitalization (including imaging,  microbiology, ancillary and laboratory) are listed below for reference.    LAB RESULTS: Basic Metabolic Panel: Recent Labs  Lab 01/04/18 0304 01/05/18 0237  NA 137 133*  K 3.6 4.8  CL 112* 108  CO2 17* 18*  GLUCOSE 217* 556*  BUN 39* 27*  CREATININE 1.59* 0.86  CALCIUM 8.0* 8.0*   Liver Function Tests: Recent Labs  Lab 01/03/18 1555  AST 24  ALT 22  ALKPHOS 68  BILITOT 0.8  PROT 7.0  ALBUMIN 3.4*   Recent Labs  Lab 01/03/18 1555  LIPASE 34   No results for input(s): AMMONIA in the last 168 hours. CBC: Recent Labs  Lab 01/04/18 0304 01/05/18 0237  WBC 8.8 5.5  HGB 10.2* 9.0*  HCT 32.9* 29.2*  MCV 75.8* 77.0*  PLT 336 281   Cardiac Enzymes: No results for input(s): CKTOTAL, CKMB, CKMBINDEX, TROPONINI in the last 168 hours. BNP: Invalid input(s): POCBNP CBG: Recent Labs  Lab 01/05/18 0614 01/05/18 1114  GLUCAP 359* 293*    Significant Diagnostic Studies:  Dg Chest 2 View  Result Date: 01/03/2018 CLINICAL DATA:  Shortness of breath. EXAM: CHEST  2 VIEW COMPARISON:  04/01/2007 FINDINGS: The heart size and mediastinal contours are within normal limits. Both lungs are clear. The  visualized skeletal structures are unremarkable. IMPRESSION: No active cardiopulmonary disease. Electronically Signed   By: Misty Stanley M.D.   On: 01/03/2018 18:03   Nm Pulmonary Vent And Perf (v/q Scan)  Result Date: 01/04/2018 CLINICAL DATA:  Patient was dosed iv with 4.2 mci 1mtc MAAfor perfusion and 32 mci I 131 for ventialtion. Hx 2 weeks history of epigastric pain, lower back pain, decrease in appetite and intractable nausea and vomiting. Elevated creatinine.^59millicurie MAA TECHNETIUM TO 6M ALBUMIN AGGREGATED, 41millicurie DTPA TECHNETIUM TC 6M DIETHYLENETRIAME-PENTAACETIC ACID EXAM: NUCLEAR MEDICINE VENTILATION - PERFUSION LUNG SCAN TECHNIQUE: Ventilation images were obtained in multiple projections using inhaled aerosol Tc-1m DTPA. Perfusion images were obtained in  multiple projections after intravenous injection of Tc-34m MAA. RADIOPHARMACEUTICALS:  32 mCi of Tc-6m DTPA aerosol inhalation and 4.2 mCi Tc82m MAA-IV COMPARISON:  Chest radiograph 01/03/2018 FINDINGS: Ventilation: No focal ventilation defect. Perfusion: No wedge shaped peripheral perfusion defects to suggest acute pulmonary embolism. IMPRESSION: No evidence acute pulmonary embolism.  Normal lung perfusion. Electronically Signed   By: Suzy Bouchard M.D.   On: 01/04/2018 14:15    2D ECHO:   Disposition and Follow-up: Discharge Instructions    Diet Carb Modified   Complete by:  As directed    Discharge instructions   Complete by:  As directed    Please take Novolin 70/30 insulin 13 units twice a day for next 1-2 days. Once sugars stabilize and running less than 140, change to 11 units twice a day.  It is VERY IMPORTANT that you follow up with a PCP on a regular basis.  Check your blood glucoses before each meal and at bedtime and maintain a log of your readings.  Bring this log with you when you follow up with your PCP so that he or she can adjust your insulin at your follow up visit.   Increase activity slowly   Complete by:  As directed        DISPOSITION: Home   DISCHARGE FOLLOW-UP Follow-up Information    Mack Hook, MD. Schedule an appointment as soon as possible for a visit in 1 week(s).   Specialty:  Internal Medicine Why:  for follow-up Contact information: [address] [place] [address] [phone]            Time spent on Discharge: 35 minutes  Signed:   Estill Cotta M.D. [facility] 01/05/2018, 12:55 PM Pager: [phone]

## 2018-01-08 LAB — CULTURE, BLOOD (ROUTINE X 2)
CULTURE: NO GROWTH
Culture: NO GROWTH
Special Requests: ADEQUATE
Special Requests: ADEQUATE

## 2018-01-11 LAB — IRON AND TIBC
Iron Saturation: 7 % — CL (ref 15–55)
Iron: 28 ug/dL (ref 27–159)
TIBC: 417 ug/dL (ref 250–450)
UIBC: 389 ug/dL (ref 131–425)

## 2018-01-11 LAB — SPECIMEN STATUS REPORT

## 2018-01-18 ENCOUNTER — Other Ambulatory Visit: Payer: Medicaid Other

## 2018-01-18 VITALS — BP 184/98 | HR 84

## 2018-01-18 DIAGNOSIS — E785 Hyperlipidemia, unspecified: Secondary | ICD-10-CM

## 2018-01-18 DIAGNOSIS — E119 Type 2 diabetes mellitus without complications: Secondary | ICD-10-CM

## 2018-01-18 DIAGNOSIS — I10 Essential (primary) hypertension: Secondary | ICD-10-CM

## 2018-01-18 LAB — GLUCOSE, POCT (MANUAL RESULT ENTRY): POC Glucose: 463 mg/dl — AB (ref 70–99)

## 2018-01-18 MED ORDER — INSULIN NPH ISOPHANE & REGULAR (70-30) 100 UNIT/ML ~~LOC~~ SUSP: SUBCUTANEOUS | 3 refills | Status: DC

## 2018-01-18 MED ORDER — AMLODIPINE BESYLATE 5 MG PO TABS: 5.0000 mg | ORAL_TABLET | Freq: Every day | ORAL | 11 refills | Status: DC

## 2018-01-18 NOTE — Progress Notes (Addendum)
Patient ID: Holly Ortiz, female   DOB: 1966-08-22, 52 y.o.   MRN: [id number] Patient admitted with hyperglycemia and AKI. Metformin held due to AKI and not controlling sugars Developed angioedema with Lisinopril and stopped Lisinopril as well Using Novolin 70/30 15 units twice daily Will start Amlodipine 5 mg daily. To increase insulin to 17 units twice daily--to increase by 2 units each dose twice daily every 2days if sugars above 200 We will get her in for appt to see me beginning next week.

## 2018-01-18 NOTE — Addendum Note (Signed)
Addended by: Marcelino Duster on: 01/18/2018 06:06 PM   Modules accepted: Orders

## 2018-01-18 NOTE — Progress Notes (Signed)
Patient came in for BP check and stated she has not been on any of her BP medication or diabetic medication except for her insulin. patient states she was in the hospital and was septic and they took her off the medication. Patient  BP was high today at visit and her sugar was 463( fasting). Informed patient I would speak to Dr. Amil Amen regarding this and we will contact with medication changes and if she needs to be seen in office before 02/15/18.

## 2018-01-19 LAB — LIPID PANEL W/O CHOL/HDL RATIO
CHOLESTEROL TOTAL: 163 mg/dL (ref 100–199)
HDL: 33 mg/dL — AB (ref >39)
LDL CALC: 70 mg/dL (ref 0–99)
TRIGLYCERIDES: 302 mg/dL — AB (ref 0–149)
VLDL CHOLESTEROL CAL: 60 mg/dL — AB (ref 5–40)

## 2018-01-23 ENCOUNTER — Ambulatory Visit: Payer: Medicaid Other | Admitting: Internal Medicine

## 2018-01-23 ENCOUNTER — Encounter: Payer: Self-pay | Admitting: Internal Medicine

## 2018-01-23 VITALS — BP 172/98 | HR 78 | Resp 12 | Ht 61.5 in | Wt 180.0 lb

## 2018-01-23 DIAGNOSIS — T783XXA Angioneurotic edema, initial encounter: Secondary | ICD-10-CM

## 2018-01-23 DIAGNOSIS — I10 Essential (primary) hypertension: Secondary | ICD-10-CM

## 2018-01-23 DIAGNOSIS — E118 Type 2 diabetes mellitus with unspecified complications: Secondary | ICD-10-CM

## 2018-01-23 DIAGNOSIS — M791 Myalgia, unspecified site: Secondary | ICD-10-CM

## 2018-01-23 DIAGNOSIS — N179 Acute kidney failure, unspecified: Secondary | ICD-10-CM

## 2018-01-23 DIAGNOSIS — T464X1A Poisoning by angiotensin-converting-enzyme inhibitors, accidental (unintentional), initial encounter: Secondary | ICD-10-CM

## 2018-01-23 DIAGNOSIS — E782 Mixed hyperlipidemia: Secondary | ICD-10-CM

## 2018-01-23 DIAGNOSIS — T464X5A Adverse effect of angiotensin-converting-enzyme inhibitors, initial encounter: Secondary | ICD-10-CM

## 2018-01-23 DIAGNOSIS — Z794 Long term (current) use of insulin: Secondary | ICD-10-CM

## 2018-01-23 LAB — GLUCOSE, POCT (MANUAL RESULT ENTRY): POC GLUCOSE: 507 mg/dL — AB (ref 70–99)

## 2018-01-23 NOTE — Patient Instructions (Signed)
Keep track of sugars as discussed twice daily before breakfast and dinner. Restart Metformin and Glimepiride Titrate down on insulin by 2 units from each dose of insulin daily if sugars drop quickly. If you remain above 200, okay to continue insulin at the dose you are taking. Stop eating carbs without fiber. Start gradually increasing your walks to 30 and then 60 minutes daily. Fish twice weekly Call if you aren't sure what to do with meds or if side effect.

## 2018-01-23 NOTE — Progress Notes (Signed)
Subjective:    Patient ID: Holly Ortiz, female    DOB: March 14, 1966, 52 y.o.   MRN: [id number]  HPI   1.  Angioedema and hives:  Became clear with last hospitalization, she was suffering from angioedema due to Lisinopril and not just hives.  Reports that no one in the past 11 years with the hives (and now she shares swelling of lips) had her performed trials of stopping different meds, including during her extensive allergy work ups.  She was hospitalized 01/03/2018 with severe dehydration from Nausea, vomiting and diarrhea.  Septic work up was negative.  Influenza testing negative.  2.  DM:  Sugars were in 190 to higher 200s once she restarted her Metformin 1000 mg twice and Glimepiride 4 mg, but became ill 1 week later.   Since discharge from hospital, on insulin, her sugars are running 500-600.   Her Metformin was stopped due to dehydration, IV contrast load and lactic acidosis with her GI illness. She had not obtained Januvia as too expensive. Currently taking Novolin 70/30 17 units twice daily.    Diet:  Describes a decent diet.  Some high glycemic index carbs.    Does hurt all over.  She is thinking this may be due to her high sugars   2.  Essential Hypertension:  Missed her Amlodipine and HCTZ this morning.    3.  Hyperlipidemia:  Taking Atorvastatin.  LDL at 70, but Trigs above 300 and HDL low at 33.  She did not tolerate fish oil in the past.  FLP on 01/18/2018 Willing to increase fish in her diet to twice weekly and to get moving with physical activity.  Lipid Panel     Component Value Date/Time   CHOL 163 01/18/2018 0859   TRIG 302 (H) 01/18/2018 0859   HDL 33 (L) 01/18/2018 0859   CHOLHDL 5 07/17/2011 1041   VLDL 49.6 (H) 07/17/2011 1041   LDLCALC 70 01/18/2018 0859   LDLDIRECT 145.9 07/17/2011 1041    Current Meds  Medication Sig  . amLODipine (NORVASC) 5 MG tablet Take 1 tablet (5 mg total) by mouth daily.  Marland Kitchen aspirin 81 MG tablet Take 81 mg by mouth daily.  Marland Kitchen  atorvastatin (LIPITOR) 20 MG tablet Take 1 tablet (20 mg total) by mouth daily.  Marland Kitchen escitalopram (LEXAPRO) 20 MG tablet Take 1 tablet (20 mg total) by mouth daily.  . hydrochlorothiazide (HYDRODIURIL) 12.5 MG tablet Take 1 tablet (12.5 mg total) by mouth daily.  . insulin NPH-regular Human (NOVOLIN 70/30) (70-30) 100 UNIT/ML injection Inject 17 units subcutaneously twice daily before meals  . Insulin Syringes, Disposable, U-100 0.5 ML MISC Take as directed with your insulin regimen  . montelukast (SINGULAIR) 10 MG tablet Take 10 mg by mouth at bedtime.    Allergies  Allergen Reactions  . Actos [Pioglitazone Hydrochloride] Other (See Comments)    Wt gain  . Lisinopril     Angioedema, including likely GI involvement    Review of Systems     Objective:   Physical Exam  NAD Skin:  Clear of hives.  Lips and tongue without swelling. Lungs:  CTA CV:  RRR without murmur or rub, radial and DP pulses normal and equal Abd:  S, NT, No HSM or mass, + BS LE:  No edema        Assessment & Plan:  1.  DM:  BMP to be certain her kidney function has completely normalized.  Restart Metformin and Glimepiride.  To wean insulin as  her sugars drop. To avoid carbs with high glycemic intake  To keep track of sugars and bring in to bp check in 1 week.   Call if any problems.  2.  Essential Hypertension:  To return after she has not missed her bp meds, Amlodipine and HCTZ  3.  Hyperlipidemia:  Increase fish intake to twice weekly and get moving with regular physical activity.  4. Angioedema from ACE I/Lisinopril.  Documented in allergies.  Her GI symptoms, which she shares today have been recurrent, may be due to GI involvement with angioedema all these years as well.  5.  Muscle Aches:  Add TSH.  Likely due to poorly controlled DM  Follow up in 2 months.  Cancel March appt.

## 2018-01-24 LAB — BASIC METABOLIC PANEL
BUN / CREAT RATIO: 22 (ref 9–23)
BUN: 13 mg/dL (ref 6–24)
CHLORIDE: 94 mmol/L — AB (ref 96–106)
CO2: 26 mmol/L (ref 20–29)
Calcium: 9.8 mg/dL (ref 8.7–10.2)
Creatinine, Ser: 0.58 mg/dL (ref 0.57–1.00)
GFR calc Af Amer: 123 mL/min/{1.73_m2} (ref >59)
GFR calc non Af Amer: 107 mL/min/{1.73_m2} (ref >59)
GLUCOSE: 374 mg/dL — AB (ref 65–99)
Potassium: 4.6 mmol/L (ref 3.5–5.2)
SODIUM: 137 mmol/L (ref 134–144)

## 2018-01-24 LAB — TSH: TSH: 1.06 u[IU]/mL (ref 0.450–4.500)

## 2018-02-15 ENCOUNTER — Ambulatory Visit: Payer: Self-pay | Admitting: Internal Medicine

## 2018-03-28 ENCOUNTER — Emergency Department (HOSPITAL_COMMUNITY): Payer: Self-pay

## 2018-03-28 ENCOUNTER — Inpatient Hospital Stay (HOSPITAL_COMMUNITY)
Admission: EM | Admit: 2018-03-28 | Discharge: 2018-04-02 | DRG: 637 | Disposition: A | Discharge status: Home / Self Care | Payer: Self-pay | Attending: Family Medicine | Admitting: Family Medicine

## 2018-03-28 ENCOUNTER — Encounter (HOSPITAL_COMMUNITY): Payer: Self-pay | Admitting: *Deleted

## 2018-03-28 ENCOUNTER — Other Ambulatory Visit: Payer: Self-pay

## 2018-03-28 DIAGNOSIS — E872 Acidosis, unspecified: Secondary | ICD-10-CM

## 2018-03-28 DIAGNOSIS — R739 Hyperglycemia, unspecified: Secondary | ICD-10-CM | POA: Diagnosis present

## 2018-03-28 DIAGNOSIS — I10 Essential (primary) hypertension: Secondary | ICD-10-CM | POA: Diagnosis present

## 2018-03-28 DIAGNOSIS — R000 Tachycardia, unspecified: Secondary | ICD-10-CM | POA: Diagnosis present

## 2018-03-28 DIAGNOSIS — Z79899 Other long term (current) drug therapy: Secondary | ICD-10-CM

## 2018-03-28 DIAGNOSIS — E669 Obesity, unspecified: Secondary | ICD-10-CM | POA: Diagnosis present

## 2018-03-28 DIAGNOSIS — Z9111 Patient's noncompliance with dietary regimen: Secondary | ICD-10-CM

## 2018-03-28 DIAGNOSIS — Z7982 Long term (current) use of aspirin: Secondary | ICD-10-CM

## 2018-03-28 DIAGNOSIS — L509 Urticaria, unspecified: Secondary | ICD-10-CM | POA: Diagnosis present

## 2018-03-28 DIAGNOSIS — J9601 Acute respiratory failure with hypoxia: Secondary | ICD-10-CM | POA: Diagnosis present

## 2018-03-28 DIAGNOSIS — J45909 Unspecified asthma, uncomplicated: Secondary | ICD-10-CM | POA: Diagnosis present

## 2018-03-28 DIAGNOSIS — J219 Acute bronchiolitis, unspecified: Secondary | ICD-10-CM | POA: Diagnosis present

## 2018-03-28 DIAGNOSIS — E1110 Type 2 diabetes mellitus with ketoacidosis without coma: Principal | ICD-10-CM | POA: Diagnosis present

## 2018-03-28 DIAGNOSIS — Z6836 Body mass index (BMI) 36.0-36.9, adult: Secondary | ICD-10-CM

## 2018-03-28 DIAGNOSIS — E785 Hyperlipidemia, unspecified: Secondary | ICD-10-CM | POA: Diagnosis present

## 2018-03-28 LAB — CBC
HEMATOCRIT: 43.5 % (ref 36.0–46.0)
HEMOGLOBIN: 13.9 g/dL (ref 12.0–15.0)
MCH: 24.9 pg — ABNORMAL LOW (ref 26.0–34.0)
MCHC: 32 g/dL (ref 30.0–36.0)
MCV: 78 fL (ref 78.0–100.0)
Platelets: 263 10*3/uL (ref 150–400)
RBC: 5.58 MIL/uL — ABNORMAL HIGH (ref 3.87–5.11)
RDW: 15.6 % — ABNORMAL HIGH (ref 11.5–15.5)
WBC: 6.9 10*3/uL (ref 4.0–10.5)

## 2018-03-28 LAB — BASIC METABOLIC PANEL
Anion gap: 16 — ABNORMAL HIGH (ref 5–15)
BUN: 13 mg/dL (ref 6–20)
CO2: 19 mmol/L — AB (ref 22–32)
Calcium: 8.9 mg/dL (ref 8.9–10.3)
Chloride: 96 mmol/L — ABNORMAL LOW (ref 101–111)
Creatinine, Ser: 0.77 mg/dL (ref 0.44–1.00)
GFR calc non Af Amer: >60 mL/min (ref >60)
Glucose, Bld: 470 mg/dL — ABNORMAL HIGH (ref 65–99)
Potassium: 4.4 mmol/L (ref 3.5–5.1)
SODIUM: 131 mmol/L — AB (ref 135–145)

## 2018-03-28 LAB — I-STAT VENOUS BLOOD GAS, ED
Acid-base deficit: 2 mmol/L (ref 0.0–2.0)
Bicarbonate: 23.5 mmol/L (ref 20.0–28.0)
O2 SAT: 77 %
PCO2 VEN: 42.9 mmHg — AB (ref 44.0–60.0)
PO2 VEN: 44 mmHg (ref 32.0–45.0)
TCO2: 25 mmol/L (ref 22–32)
pH, Ven: 7.347 (ref 7.250–7.430)

## 2018-03-28 LAB — CBG MONITORING, ED
GLUCOSE-CAPILLARY: 382 mg/dL — AB (ref 65–99)
GLUCOSE-CAPILLARY: 459 mg/dL — AB (ref 65–99)

## 2018-03-28 LAB — GLUCOSE, CAPILLARY: Glucose-Capillary: 327 mg/dL — ABNORMAL HIGH (ref 65–99)

## 2018-03-28 LAB — I-STAT TROPONIN, ED: Troponin i, poc: 0 ng/mL (ref 0.00–0.08)

## 2018-03-28 MED ORDER — INSULIN ASPART PROT & ASPART (70-30 MIX) 100 UNIT/ML ~~LOC~~ SUSP
22.0000 [IU] | Freq: Two times a day (BID) | SUBCUTANEOUS | Status: DC
  Administered 2018-03-28 – 2018-03-29 (×2): 22 [IU] via SUBCUTANEOUS
  Filled 2018-03-28 (×2): qty 10

## 2018-03-28 MED ORDER — FAMOTIDINE 20 MG PO TABS
20.0000 mg | ORAL_TABLET | Freq: Two times a day (BID) | ORAL | Status: DC
  Administered 2018-03-28 – 2018-04-02 (×10): 20 mg via ORAL
  Filled 2018-03-28 (×10): qty 1

## 2018-03-28 MED ORDER — ACETAMINOPHEN 650 MG RE SUPP: 650.0000 mg | Freq: Four times a day (QID) | RECTAL | Status: DC | PRN

## 2018-03-28 MED ORDER — GUAIFENESIN ER 600 MG PO TB12
1200.0000 mg | ORAL_TABLET | Freq: Two times a day (BID) | ORAL | Status: DC
  Administered 2018-03-28 – 2018-04-02 (×10): 1200 mg via ORAL
  Filled 2018-03-28 (×10): qty 2

## 2018-03-28 MED ORDER — INSULIN ASPART 100 UNIT/ML ~~LOC~~ SOLN
6.0000 [IU] | Freq: Three times a day (TID) | SUBCUTANEOUS | Status: DC
  Administered 2018-03-29 – 2018-04-02 (×12): 6 [IU] via SUBCUTANEOUS

## 2018-03-28 MED ORDER — ESCITALOPRAM OXALATE 20 MG PO TABS
20.0000 mg | ORAL_TABLET | Freq: Every day | ORAL | Status: DC
  Administered 2018-03-28 – 2018-04-02 (×6): 20 mg via ORAL
  Filled 2018-03-28 (×2): qty 1
  Filled 2018-03-28: qty 2
  Filled 2018-03-28 (×3): qty 1

## 2018-03-28 MED ORDER — ATORVASTATIN CALCIUM 20 MG PO TABS
20.0000 mg | ORAL_TABLET | Freq: Every day | ORAL | Status: DC
  Administered 2018-03-29 – 2018-04-02 (×5): 20 mg via ORAL
  Filled 2018-03-28 (×5): qty 1

## 2018-03-28 MED ORDER — SODIUM CHLORIDE 0.9 % IV SOLN
INTRAVENOUS | Status: DC
  Filled 2018-03-28: qty 1

## 2018-03-28 MED ORDER — SODIUM CHLORIDE 0.9 % IV SOLN: INTRAVENOUS | Status: DC

## 2018-03-28 MED ORDER — SODIUM CHLORIDE 0.9 % IV BOLUS
1000.0000 mL | Freq: Once | INTRAVENOUS | Status: AC
  Administered 2018-03-28: 1000 mL via INTRAVENOUS

## 2018-03-28 MED ORDER — DIPHENHYDRAMINE HCL 25 MG PO CAPS
25.0000 mg | ORAL_CAPSULE | Freq: Four times a day (QID) | ORAL | Status: DC | PRN
Start: 2018-03-28 — End: 2018-04-02
  Administered 2018-03-28 – 2018-03-29 (×2): 25 mg via ORAL
  Filled 2018-03-28 (×2): qty 1

## 2018-03-28 MED ORDER — DIPHENHYDRAMINE HCL 50 MG/ML IJ SOLN
25.0000 mg | Freq: Once | INTRAMUSCULAR | Status: AC
  Administered 2018-03-28: 25 mg via INTRAVENOUS
  Filled 2018-03-28: qty 1

## 2018-03-28 MED ORDER — ORAL CARE MOUTH RINSE
15.0000 mL | Freq: Two times a day (BID) | OROMUCOSAL | Status: DC
  Administered 2018-03-28 – 2018-04-02 (×9): 15 mL via OROMUCOSAL

## 2018-03-28 MED ORDER — ACETAMINOPHEN 325 MG PO TABS
650.0000 mg | ORAL_TABLET | Freq: Four times a day (QID) | ORAL | Status: DC | PRN
  Administered 2018-03-29 – 2018-04-01 (×8): 650 mg via ORAL
  Filled 2018-03-28 (×8): qty 2

## 2018-03-28 MED ORDER — HEPARIN SODIUM (PORCINE) 5000 UNIT/ML IJ SOLN
5000.0000 [IU] | Freq: Three times a day (TID) | INTRAMUSCULAR | Status: DC
  Administered 2018-03-28 – 2018-04-02 (×14): 5000 [IU] via SUBCUTANEOUS
  Filled 2018-03-28 (×12): qty 1

## 2018-03-28 MED ORDER — ONDANSETRON 4 MG PO TBDP: 4.0000 mg | ORAL_TABLET | Freq: Three times a day (TID) | ORAL | Status: DC | PRN

## 2018-03-28 MED ORDER — IOPAMIDOL (ISOVUE-370) INJECTION 76%
INTRAVENOUS | Status: AC
  Filled 2018-03-28: qty 100

## 2018-03-28 MED ORDER — SODIUM CHLORIDE 0.9 % IV SOLN
INTRAVENOUS | Status: DC
  Administered 2018-03-28 – 2018-03-29 (×3): via INTRAVENOUS

## 2018-03-28 MED ORDER — INSULIN ASPART 100 UNIT/ML ~~LOC~~ SOLN
0.0000 [IU] | Freq: Three times a day (TID) | SUBCUTANEOUS | Status: DC
  Administered 2018-03-29: 7 [IU] via SUBCUTANEOUS
  Administered 2018-03-29: 4 [IU] via SUBCUTANEOUS
  Administered 2018-03-29: 11 [IU] via SUBCUTANEOUS
  Administered 2018-03-30: 7 [IU] via SUBCUTANEOUS
  Administered 2018-03-30 – 2018-03-31 (×3): 4 [IU] via SUBCUTANEOUS
  Administered 2018-03-31: 7 [IU] via SUBCUTANEOUS
  Administered 2018-03-31: 4 [IU] via SUBCUTANEOUS
  Administered 2018-04-01: 7 [IU] via SUBCUTANEOUS
  Administered 2018-04-01 (×2): 3 [IU] via SUBCUTANEOUS
  Administered 2018-04-02: 4 [IU] via SUBCUTANEOUS
  Administered 2018-04-02: 7 [IU] via SUBCUTANEOUS

## 2018-03-28 MED ORDER — POLYETHYLENE GLYCOL 3350 17 G PO PACK: 17.0000 g | PACK | Freq: Every day | ORAL | Status: DC | PRN

## 2018-03-28 MED ORDER — LORATADINE 10 MG PO TABS
10.0000 mg | ORAL_TABLET | Freq: Every day | ORAL | Status: DC
  Administered 2018-03-28 – 2018-04-02 (×6): 10 mg via ORAL
  Filled 2018-03-28 (×6): qty 1

## 2018-03-28 MED ORDER — IPRATROPIUM-ALBUTEROL 0.5-2.5 (3) MG/3ML IN SOLN
3.0000 mL | Freq: Once | RESPIRATORY_TRACT | Status: AC
  Administered 2018-03-28: 3 mL via RESPIRATORY_TRACT
  Filled 2018-03-28: qty 3

## 2018-03-28 MED ORDER — ONDANSETRON HCL 4 MG/2ML IJ SOLN: 4.0000 mg | Freq: Four times a day (QID) | INTRAMUSCULAR | Status: DC | PRN

## 2018-03-28 MED ORDER — TRAZODONE HCL 50 MG PO TABS: 50.0000 mg | ORAL_TABLET | Freq: Every evening | ORAL | Status: DC | PRN

## 2018-03-28 MED ORDER — SODIUM CHLORIDE 0.9 % IV SOLN: 250.0000 mL | INTRAVENOUS | Status: DC | PRN

## 2018-03-28 MED ORDER — SODIUM CHLORIDE 0.9% FLUSH: 3.0000 mL | INTRAVENOUS | Status: DC | PRN

## 2018-03-28 MED ORDER — ONDANSETRON HCL 4 MG PO TABS
4.0000 mg | ORAL_TABLET | Freq: Four times a day (QID) | ORAL | Status: DC | PRN
  Administered 2018-03-29 – 2018-03-30 (×3): 4 mg via ORAL
  Filled 2018-03-28 (×3): qty 1

## 2018-03-28 MED ORDER — MONTELUKAST SODIUM 10 MG PO TABS
10.0000 mg | ORAL_TABLET | Freq: Every day | ORAL | Status: DC
  Administered 2018-03-28 – 2018-04-01 (×5): 10 mg via ORAL
  Filled 2018-03-28 (×5): qty 1

## 2018-03-28 MED ORDER — INSULIN ASPART PROT & ASPART (70-30 MIX) 100 UNIT/ML ~~LOC~~ SUSP: 20.0000 [IU] | Freq: Two times a day (BID) | SUBCUTANEOUS | Status: DC

## 2018-03-28 MED ORDER — SODIUM CHLORIDE 0.9% FLUSH
3.0000 mL | Freq: Two times a day (BID) | INTRAVENOUS | Status: DC
  Administered 2018-03-29 – 2018-04-02 (×8): 3 mL via INTRAVENOUS

## 2018-03-28 MED ORDER — INSULIN ASPART 100 UNIT/ML ~~LOC~~ SOLN
8.0000 [IU] | Freq: Once | SUBCUTANEOUS | Status: AC
  Administered 2018-03-28: 8 [IU] via INTRAVENOUS
  Filled 2018-03-28: qty 1

## 2018-03-28 MED ORDER — AMLODIPINE BESYLATE 5 MG PO TABS
5.0000 mg | ORAL_TABLET | Freq: Every day | ORAL | Status: DC
  Administered 2018-03-28 – 2018-03-30 (×3): 5 mg via ORAL
  Filled 2018-03-28 (×3): qty 1

## 2018-03-28 MED ORDER — DEXTROSE-NACL 5-0.45 % IV SOLN: INTRAVENOUS | Status: DC

## 2018-03-28 MED ORDER — IOPAMIDOL (ISOVUE-370) INJECTION 76%
100.0000 mL | Freq: Once | INTRAVENOUS | Status: AC | PRN
  Administered 2018-03-28: 100 mL via INTRAVENOUS

## 2018-03-28 MED ORDER — ASPIRIN EC 81 MG PO TBEC
81.0000 mg | DELAYED_RELEASE_TABLET | Freq: Every day | ORAL | Status: DC
  Administered 2018-03-28 – 2018-04-02 (×6): 81 mg via ORAL
  Filled 2018-03-28 (×6): qty 1

## 2018-03-28 MED ORDER — INSULIN ASPART 100 UNIT/ML ~~LOC~~ SOLN
0.0000 [IU] | Freq: Every day | SUBCUTANEOUS | Status: DC
  Administered 2018-03-28: 4 [IU] via SUBCUTANEOUS
  Administered 2018-03-29: 2 [IU] via SUBCUTANEOUS
  Administered 2018-03-31: 3 [IU] via SUBCUTANEOUS

## 2018-03-28 MED ORDER — ALBUTEROL SULFATE (2.5 MG/3ML) 0.083% IN NEBU: 2.5000 mg | INHALATION_SOLUTION | RESPIRATORY_TRACT | Status: DC | PRN

## 2018-03-28 NOTE — ED Notes (Signed)
Admitting at the bedside. Per admitting, plan is to d/c insulin drip.

## 2018-03-28 NOTE — ED Notes (Addendum)
Pt O2 92% on 2L. O2 increased to 4L [place].

## 2018-03-28 NOTE — ED Notes (Addendum)
Admitting notified pt HR still 125-130 bpm. Per Denton Brick, MD will change bed to tele

## 2018-03-28 NOTE — ED Triage Notes (Signed)
Pt reports hx of hives for several years. Pt started breaking out last night and became sob. No relief with singulair pta. Airway intact at triage.

## 2018-03-28 NOTE — ED Notes (Signed)
Pt ambulatory to restroom with steady gait.

## 2018-03-28 NOTE — ED Notes (Signed)
Dr Tomi Bamberger aware of pt O2 sat. Per EDP, pt removed from [place] to monitor continuous RA sat.

## 2018-03-28 NOTE — ED Notes (Signed)
Pt placed on 2L [place]. O2 sat at 91% on RA.

## 2018-03-28 NOTE — ED Notes (Signed)
Insulin drip still not available in ED. Will f/u with main pharmacy. CT called and notified pt ready for scan. Will try and have pt go to scan prior to starting insulin in line.

## 2018-03-28 NOTE — Progress Notes (Signed)
Report received. Room ready.  

## 2018-03-28 NOTE — ED Notes (Signed)
Dinner tray ordered.

## 2018-03-28 NOTE — ED Provider Notes (Signed)
[place] EMERGENCY DEPARTMENT Provider Note   CSN: [id number] Arrival date & time: 03/28/18  6433     History   Chief Complaint Chief Complaint  Patient presents with  . Shortness of Breath  . Allergic Reaction    HPI Holly Ortiz is a 52 y.o. female.  HPI Pt states she has a history of breaking out into hives.  She had an episode yesterday that was worse than usual.  Pt also started ache and hurt all over, even her hair hurts.  She then started coughing yesterday as well.  She felt feverish.  She is nauseated and vomited a little bit.  No diarrhea.  No dysuria.    Past Medical History:  Diagnosis Date  . Allergy   . 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-I)   . Asthma   . Diabetes mellitus    type II  . Hyperlipidemia   . Hypertension   . Urticaria, chronic   . UTI (lower urinary tract infection)     Patient Active Problem List   Diagnosis Date Noted  . Hyperosmolar hyperglycemia 01/04/2018  . AKI (acute kidney injury) ([place]) 01/04/2018  . Hyperglycemia 01/03/2018  . 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-I)   . Asthma   . Diabetes mellitus   . Hyperlipidemia   . Hypertension     Past Surgical History:  Procedure Laterality Date  . CATARACT EXTRACTION, BILATERAL Bilateral 2014  . SOFT TISSUE MASS EXCISION Right 2016   [place] area  . TUBAL LIGATION  1995     OB History    Gravida  4   Para      Term      Preterm      AB      Living  4     SAB      TAB      Ectopic      Multiple      Live Births               Home Medications    Prior to Admission medications   Medication Sig Start Date End Date Taking? Authorizing Provider  amLODipine (NORVASC) 5 MG tablet Take 1 tablet (5 mg total) by mouth daily. 01/18/18  Yes Mack Hook, MD  aspirin 81 MG tablet Take 81 mg by mouth daily.   Yes [provider]  atorvastatin (LIPITOR) 20 MG tablet Take  1 tablet (20 mg total) by mouth daily. 12/18/17 12/18/18 Yes Mack Hook, MD  escitalopram (LEXAPRO) 20 MG tablet Take 1 tablet (20 mg total) by mouth daily. 12/18/17  Yes Mack Hook, MD  hydrochlorothiazide (HYDRODIURIL) 12.5 MG tablet Take 1 tablet (12.5 mg total) by mouth daily. 01/05/18  Yes Rai, Ripudeep K, MD  insulin NPH-regular Human (NOVOLIN 70/30) (70-30) 100 UNIT/ML injection Inject 17 units subcutaneously twice daily before meals 01/18/18  Yes Mack Hook, MD  montelukast (SINGULAIR) 10 MG tablet Take 10 mg by mouth at bedtime.   Yes [provider]  ondansetron (ZOFRAN ODT) 4 MG disintegrating tablet Take 1 tablet (4 mg total) by mouth every 8 (eight) hours as needed for nausea or vomiting. 01/05/18  Yes Rai, Ripudeep K, MD  Insulin Syringes, Disposable, U-100 0.5 ML MISC Take as directed with your insulin regimen 01/05/18   Mendel Corning, MD    Family History Family History  Problem Relation Age of Onset  . Hyperlipidemia Other   . Heart disease Other   .  Diabetes Father   . Hypertension Father   . COPD Father        Cause of death  . Colon cancer Mother   . Hypertension Mother   . Hypertension Sister   . Diabetes Sister   . Hypertension Brother   . Cancer Sister        Brain glioblastoma    Social History Social History   Tobacco Use  . Smoking status: Never Smoker  . Smokeless tobacco: Never Used  Substance Use Topics  . Alcohol use: No  . Drug use: No     Allergies   Actos [pioglitazone hydrochloride] and Lisinopril   Review of Systems Review of Systems  All other systems reviewed and are negative.    Physical Exam Updated Vital Signs BP (!) 133/50   Pulse (!) 125   Temp 100 F (37.8 C) (Oral)   Resp (!) 27   SpO2 95%   Physical Exam  Constitutional: She appears well-developed and well-nourished. No distress.  HENT:  Head: Normocephalic and atraumatic.  Right Ear: External ear normal.  Left Ear: External ear  normal.  Eyes: Conjunctivae are normal. Right eye exhibits no discharge. Left eye exhibits no discharge. No scleral icterus.  Neck: Neck supple. No tracheal deviation present.  Cardiovascular: Normal rate, regular rhythm and intact distal pulses.  Pulmonary/Chest: Effort normal and breath sounds normal. No stridor. No respiratory distress. She has no wheezes. She has no rales.  Abdominal: Soft. Bowel sounds are normal. She exhibits no distension. There is no tenderness. There is no rebound and no guarding.  Musculoskeletal: She exhibits no edema or tenderness.  Neurological: She is alert. She has normal strength. No cranial nerve deficit (no facial droop, extraocular movements intact, no slurred speech) or sensory deficit. She exhibits normal muscle tone. She displays no seizure activity. Coordination normal.  Skin: Skin is warm and dry. Rash noted. Rash is urticarial.  Psychiatric: She has a normal mood and affect.  Nursing note and vitals reviewed.    ED Treatments / Results  Labs (all labs ordered are listed, but only abnormal results are displayed) Labs Reviewed  BASIC METABOLIC PANEL - Abnormal; Notable for the following components:      Result Value   Sodium 131 (*)    Chloride 96 (*)    CO2 19 (*)    Glucose, Bld 470 (*)    Anion gap 16 (*)    All other components within normal limits  CBC - Abnormal; Notable for the following components:   RBC 5.58 (*)    MCH 24.9 (*)    RDW 15.6 (*)    All other components within normal limits  CBG MONITORING, ED - Abnormal; Notable for the following components:   Glucose-Capillary 382 (*)    All other components within normal limits  I-STAT VENOUS BLOOD GAS, ED - Abnormal; Notable for the following components:   pCO2, Ven 42.9 (*)    All other components within normal limits  CBG MONITORING, ED - Abnormal; Notable for the following components:   Glucose-Capillary 459 (*)    All other components within normal limits  D-DIMER,  QUANTITATIVE (NOT AT [facility])  I-STAT TROPONIN, ED    EKG EKG Interpretation  Date/Time:  Thursday March 28 2018 08:52:17 EDT Ventricular Rate:  123 PR Interval:  152 QRS Duration: 80 QT Interval:  312 QTC Calculation: 446 R Axis:   56 Text Interpretation:  Sinus tachycardia Otherwise normal ECG No significant change since last tracing Confirmed  by Dorie Rank [phone]) on 03/28/2018 1:18:58 PM   Radiology Dg Chest 2 View  Result Date: 03/28/2018 CLINICAL DATA:  History of hives. EXAM: CHEST - 2 VIEW COMPARISON:  01/03/2018. FINDINGS: Mediastinum and hilar structures normal. Heart size normal. Lungs are clear. No pleural effusion or pneumothorax. No acute bony abnormality. IMPRESSION: No acute cardiopulmonary disease. Electronically Signed   By: Marcello Moores  Register   On: 03/28/2018 09:47    Procedures Procedures (including critical care time)  Medications Ordered in ED Medications  dextrose 5 %-0.45 % sodium chloride infusion (has no administration in time range)  insulin regular (NOVOLIN R,HUMULIN R) 100 Units in sodium chloride 0.9 % 100 mL (1 Units/mL) infusion (has no administration in time range)  sodium chloride 0.9 % bolus 1,000 mL (has no administration in time range)  ipratropium-albuterol (DUONEB) 0.5-2.5 (3) MG/3ML nebulizer solution 3 mL (3 mLs Nebulization Given 03/28/18 1428)  diphenhydrAMINE (BENADRYL) injection 25 mg (25 mg Intravenous Given 03/28/18 1428)  sodium chloride 0.9 % bolus 1,000 mL (0 mLs Intravenous Stopped 03/28/18 1522)  insulin aspart (novoLOG) injection 8 Units (8 Units Intravenous Given 03/28/18 1428)     Initial Impression / Assessment and Plan / ED Course  I have reviewed the triage vital signs and the nursing notes.  Pertinent labs & imaging results that were available during my care of the patient were reviewed by me and considered in my medical decision making (see chart for details).  Clinical Course as of Mar 28 1706  Thu Mar 28, 2018  1607 Pt  has persistent hyperglycemia and tachycardia.  BMET shows an elevated anion gap acidosis.  Will start an insulin infusion   [JK]  1701 Still tachycardic.  No severe acidosis.  Atypical for PE but with her persistent symptoms will get ct angio, continue insulin, additional fluid bolus.  Consult for admission.   [JK]    Clinical Course User Index [JK] Dorie Rank, MD    Patient presented to the emergency room for evaluation of hives, nausea, tachycardia and shortness of breath.  Patient did not have any evidence of angioedema or wheezing on my exam.  She was treated for hyperglycemia but has remained persistently tachycardic.  Question whether she has some type of viral illness.   symptoms are atypical for PE presentation however with her persistent tachycardia we will get a CT scan to make sure she does not have a pulmonary embolism.  Considering her persistent tachycardia will consult the medical service for admission and further treatment.  Final Clinical Impressions(s) / ED Diagnoses   Final diagnoses:  Urticaria  Metabolic acidosis  Tachycardia      Dorie Rank, MD 03/28/18 [phone]

## 2018-03-28 NOTE — ED Notes (Signed)
q2hr vitals recheck and care team checking labs explained to pt. Pt stated understanding.

## 2018-03-28 NOTE — ED Notes (Signed)
Patient transported to CT 

## 2018-03-28 NOTE — H&P (Signed)
Patient Demographics:    Holly Ortiz, is a 52 y.o. female  MRN: [id number]   DOB - [birth year]  Admit Date - 03/28/2018  Outpatient Primary MD for the patient is Mack Hook, MD   Assessment & Plan:    Active Problems:   Hyperglycemia    1)Mild DKA-blood sugar is 470, bicarb is 19, anion gap is 16, patient admits to noncompliance with diet and lifestyle, hemoglobin A1c in February 2019 was 12.1, patient received IV insulin in the ED, continue to hydrate aggressively IV may use subcu insulin for now follow BMP and adjust IV fluids accordingly.  Consider IV insulin drip if acidosis does not resolve,  2)Persistent Tachycardia and Tachypnea-CTA chest without PE, check TSH, check echocardiogram  3)Recurrent Urticaria --avoid steroids at this time given very elevated blood sugars, use antihistamines, no concerns about angioedema type symptoms at this time, patient has had extensive work-up in the past for Urticaria including allergy and immunology evaluation as well as dermatology evaluation with no definite etiology identified  4)HTN-resume amlodipine,   may use IV Hydralazine 10 mg  Every 4 hours Prn for systolic blood pressure over 160 mmhg   With History of - Reviewed by me  Past Medical History:  Diagnosis Date  . Allergy   . 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-I)   . Asthma   . Diabetes mellitus    type II  . Hyperlipidemia   . Hypertension   . Urticaria, chronic   . UTI (lower urinary tract infection)       Past Surgical History:  Procedure Laterality Date  . CATARACT EXTRACTION, BILATERAL Bilateral 2014  . SOFT TISSUE MASS EXCISION Right 2016   [place] area  . [place]      Chief Complaint  Patient presents with  . Shortness of Breath  .  Allergic Reaction      HPI:    Holly Ortiz  is a 52 y.o. female with past medical history relevant for recurrent episodes of Urticaria that was previously investigated by dermatology and allergy and immunology without definite etiology, as well as history of poorly controlled diabetes who presents to the ED with complaints of persistent due itchy rash and URI symptoms, Pt states she has a history of breaking out into hives.  She had an episode yesterday that was worse than usual.  Pt also started ache and hurt all over, even her hair hurts.  She then started coughing yesterday as well.  She felt feverish.  She is nauseated and vomited a little bit.  No diarrhea.  No dysuria.    In the ED patient is found to be tachycardic, tachypneic with borderline oxygen saturation, despite aggressive IV fluids patient remains tachycardic,  CTA chest without PE, TSH and echocardiogram are pending due to persistent tachycardia, no chest pains no pleuritic symptoms no leg pain or leg swelling  Patient work-up in the ED also suggested uncontrolled  diabetes with blood sugars close to 500 borderline low bicarb and elevated anion gap      Review of systems:    In addition to the HPI above,   A full 12 point Review of 10 Systems was done, except as stated above, all other Review of 10 Systems were negative.    Social History:  Reviewed by me    Social History   Tobacco Use  . Smoking status: Never Smoker  . Smokeless tobacco: Never Used  Substance Use Topics  . Alcohol use: No       Family History :  Reviewed by me    Family History  Problem Relation Age of Onset  . Hyperlipidemia Other   . Heart disease Other   . Diabetes Father   . Hypertension Father   . COPD Father        Cause of death  . Colon cancer Mother   . Hypertension Mother   . Hypertension Sister   . Diabetes Sister   . Hypertension Brother   . Cancer Sister        Brain glioblastoma     Home Medications:    Prior to Admission medications   Medication Sig Start Date End Date Taking? Authorizing Provider  amLODipine (NORVASC) 5 MG tablet Take 1 tablet (5 mg total) by mouth daily. 01/18/18  Yes Mack Hook, MD  aspirin 81 MG tablet Take 81 mg by mouth daily.   Yes [provider]  atorvastatin (LIPITOR) 20 MG tablet Take 1 tablet (20 mg total) by mouth daily. 12/18/17 12/18/18 Yes Mack Hook, MD  escitalopram (LEXAPRO) 20 MG tablet Take 1 tablet (20 mg total) by mouth daily. 12/18/17  Yes Mack Hook, MD  montelukast (SINGULAIR) 10 MG tablet Take 10 mg by mouth at bedtime.   Yes [provider]  ondansetron (ZOFRAN ODT) 4 MG disintegrating tablet Take 1 tablet (4 mg total) by mouth every 8 (eight) hours as needed for nausea or vomiting. 01/05/18  Yes Rai, Vernelle Emerald, MD     Allergies:     Allergies  Allergen Reactions  . Actos [Pioglitazone Hydrochloride] Other (See Comments)    Wt gain  . Lisinopril     Angioedema, including likely GI involvement     Physical Exam:   Vitals  Blood pressure (!) 158/63, pulse (!) 123, temperature 100 F (37.8 C), temperature source Oral, resp. rate (!) 28, SpO2 96 %.  Physical Examination: General appearance - alert, well appearing, and in no distress, obese Mental status - alert, oriented to person, place, and time,  Eyes - sclera anicteric Neck - supple, no JVD elevation , Chest - clear  to auscultation bilaterally, symmetrical air movement,  Heart - S1 and S2 normal,  Abdomen - soft, nontender, nondistended, no masses or organomegaly Neurological - screening mental status exam normal, neck supple without rigidity, cranial nerves II through XII intact, DTR's normal and symmetric Extremities - no pedal edema noted, intact peripheral pulses  Skin -erythematous macular lesions consistent with Urticaria , all over trunk and extremities    Data Review:    CBC Recent Labs  Lab 03/28/18 0904  WBC 6.9  HGB  13.9  HCT 43.5  PLT 263  MCV 78.0  MCH 24.9*  MCHC 32.0  RDW 15.6*   ------------------------------------------------------------------------------------------------------------------  Chemistries  Recent Labs  Lab 03/28/18 0904  NA 131*  K 4.4  CL 96*  CO2 19*  GLUCOSE 470*  BUN 13  CREATININE 0.77  CALCIUM  8.9   ------------------------------------------------------------------------------------------------------------------ CrCl cannot be calculated (Unknown ideal weight.). ------------------------------------------------------------------------------------------------------------------ No results for input(s): TSH, T4TOTAL, T3FREE, THYROIDAB in the last 72 hours.  Invalid input(s): FREET3   Coagulation profile No results for input(s): INR, PROTIME in the last 168 hours. ------------------------------------------------------------------------------------------------------------------- No results for input(s): DDIMER in the last 72 hours. -------------------------------------------------------------------------------------------------------------------  Cardiac Enzymes No results for input(s): CKMB, TROPONINI, MYOGLOBIN in the last 168 hours.  Invalid input(s): CK ------------------------------------------------------------------------------------------------------------------ No results found for: BNP   ---------------------------------------------------------------------------------------------------------------  Urinalysis    Component Value Date/Time   COLORURINE YELLOW 01/03/2018 1721   APPEARANCEUR HAZY (A) 01/03/2018 1721   LABSPEC 1.018 01/03/2018 1721   PHURINE 5.0 01/03/2018 1721   GLUCOSEU 50 (A) 01/03/2018 1721   GLUCOSEU >=1000 07/17/2011 1041   HGBUR NEGATIVE 01/03/2018 1721   BILIRUBINUR NEGATIVE 01/03/2018 1721   BILIRUBINUR NEG 07/31/2012 1015   KETONESUR NEGATIVE 01/03/2018 1721   PROTEINUR NEGATIVE 01/03/2018 1721   UROBILINOGEN  negative 07/31/2012 1015   UROBILINOGEN 1.0 07/17/2011 1041   NITRITE NEGATIVE 01/03/2018 1721   LEUKOCYTESUR TRACE (A) 01/03/2018 1721    ----------------------------------------------------------------------------------------------------------------   Imaging Results:    Dg Chest 2 View  Result Date: 03/28/2018 CLINICAL DATA:  History of hives. EXAM: CHEST - 2 VIEW COMPARISON:  01/03/2018. FINDINGS: Mediastinum and hilar structures normal. Heart size normal. Lungs are clear. No pleural effusion or pneumothorax. No acute bony abnormality. IMPRESSION: No acute cardiopulmonary disease. Electronically Signed   By: Marcello Moores  Register   On: 03/28/2018 09:47   Ct Angio Chest Pe W And/or Wo Contrast  Result Date: 03/28/2018 CLINICAL DATA:  Chronic cough. History of thighs for several years. Patient started breaking out last evening and became dyspneic. EXAM: CT ANGIOGRAPHY CHEST WITH CONTRAST TECHNIQUE: Multidetector CT imaging of the chest was performed using the standard protocol during bolus administration of intravenous contrast. Multiplanar CT image reconstructions and MIPs were obtained to evaluate the vascular anatomy. CONTRAST:  130mL ISOVUE-370 IOPAMIDOL (ISOVUE-370) INJECTION 76% COMPARISON:  04/01/2007 FINDINGS: Cardiovascular: The study is of quality for the evaluation of pulmonary embolism. There are no filling defects in the central, lobar, segmental or subsegmental pulmonary artery branches to suggest acute pulmonary embolism. Great vessels are normal in course and caliber. Normal heart size. No significant pericardial fluid/thickening. Minimal aortic atherosclerosis. Mediastinum/Nodes: No discrete thyroid nodules. Unremarkable esophagus. No pathologically enlarged axillary, mediastinal or hilar lymph nodes. Mild gas filled distention of the esophagus without significant mural thickening. Small hiatal hernia. Lungs/Pleura: Respiratory motion artifacts site limit assessment. No pulmonary  consolidation. No pneumothorax. No pleural effusion. Suggestion of mild tree-in-bud opacities bilaterally which can be seen in bronchiolitis. Upper abdomen: Hepatic steatosis.  No mass or biliary dilatation. Musculoskeletal:  No aggressive appearing focal osseous lesions. Review of the MIP images confirms the above findings. IMPRESSION: 1. No acute pulmonary embolus. 2. Mild diffuse tree-in-bud opacities noted bilaterally compatible with bronchiolitis. 3. Hepatic steatosis. Aortic Atherosclerosis (ICD10-I70.0). Electronically Signed   By: Ashley Royalty M.D.   On: 03/28/2018 18:12    Radiological Exams on Admission: Dg Chest 2 View  Result Date: 03/28/2018 CLINICAL DATA:  History of hives. EXAM: CHEST - 2 VIEW COMPARISON:  01/03/2018. FINDINGS: Mediastinum and hilar structures normal. Heart size normal. Lungs are clear. No pleural effusion or pneumothorax. No acute bony abnormality. IMPRESSION: No acute cardiopulmonary disease. Electronically Signed   By: Marcello Moores  Register   On: 03/28/2018 09:47   Ct Angio Chest Pe W And/or Wo Contrast  Result Date: 03/28/2018 CLINICAL DATA:  Chronic cough.  History of thighs for several years. Patient started breaking out last evening and became dyspneic. EXAM: CT ANGIOGRAPHY CHEST WITH CONTRAST TECHNIQUE: Multidetector CT imaging of the chest was performed using the standard protocol during bolus administration of intravenous contrast. Multiplanar CT image reconstructions and MIPs were obtained to evaluate the vascular anatomy. CONTRAST:  173mL ISOVUE-370 IOPAMIDOL (ISOVUE-370) INJECTION 76% COMPARISON:  04/01/2007 FINDINGS: Cardiovascular: The study is of quality for the evaluation of pulmonary embolism. There are no filling defects in the central, lobar, segmental or subsegmental pulmonary artery branches to suggest acute pulmonary embolism. Great vessels are normal in course and caliber. Normal heart size. No significant pericardial fluid/thickening. Minimal aortic  atherosclerosis. Mediastinum/Nodes: No discrete thyroid nodules. Unremarkable esophagus. No pathologically enlarged axillary, mediastinal or hilar lymph nodes. Mild gas filled distention of the esophagus without significant mural thickening. Small hiatal hernia. Lungs/Pleura: Respiratory motion artifacts site limit assessment. No pulmonary consolidation. No pneumothorax. No pleural effusion. Suggestion of mild tree-in-bud opacities bilaterally which can be seen in bronchiolitis. Upper abdomen: Hepatic steatosis.  No mass or biliary dilatation. Musculoskeletal:  No aggressive appearing focal osseous lesions. Review of the MIP images confirms the above findings. IMPRESSION: 1. No acute pulmonary embolus. 2. Mild diffuse tree-in-bud opacities noted bilaterally compatible with bronchiolitis. 3. Hepatic steatosis. Aortic Atherosclerosis (ICD10-I70.0). Electronically Signed   By: Ashley Royalty M.D.   On: 03/28/2018 18:12    DVT Prophylaxis -SCD /heparin AM Labs Ordered, also please review Full Orders  Family Communication: Admission, patients condition and plan of care including tests being ordered have been discussed with the patient who indicate understanding and agree with the plan   Code Status - Full Code  Likely DC to  home  Condition   stable**  Roxan Hockey M.D on 03/28/2018 at 7:59 PM   Between 7am to 7pm - Pager - [phone] After 7pm go to [email or web address] - password [email or web address]  [facility] - Office  [phone]  Voice Recognition Viviann Spare dictation system was used to create this note, attempts have been made to correct errors. Please contact the author with questions and/or clarifications.

## 2018-03-28 NOTE — ED Notes (Addendum)
Pharmacy contacted for insulin x 2.

## 2018-03-28 NOTE — ED Notes (Signed)
Upon entering pt room to discuss POC, pt eating [employer] cheesburger. Pt and pt family states "Oh, was I not supposed to eat?" Explained NPO status to patent and family and emphasized that pt sugar is high. Pt verbalized understanding.

## 2018-03-28 NOTE — ED Notes (Signed)
Pt placed back on 2L [place]. O2 increased from 92% to 94-96%

## 2018-03-29 ENCOUNTER — Other Ambulatory Visit: Payer: Self-pay

## 2018-03-29 ENCOUNTER — Observation Stay (HOSPITAL_COMMUNITY): Payer: Self-pay

## 2018-03-29 DIAGNOSIS — R0902 Hypoxemia: Secondary | ICD-10-CM

## 2018-03-29 DIAGNOSIS — R000 Tachycardia, unspecified: Secondary | ICD-10-CM

## 2018-03-29 DIAGNOSIS — J9601 Acute respiratory failure with hypoxia: Secondary | ICD-10-CM

## 2018-03-29 LAB — BLOOD GAS, ARTERIAL
Acid-Base Excess: 2.1 mmol/L — ABNORMAL HIGH (ref 0.0–2.0)
Bicarbonate: 26.3 mmol/L (ref 20.0–28.0)
DRAWN BY: 519031
FIO2: 21
O2 SAT: 79.3 %
PATIENT TEMPERATURE: 98.6
PCO2 ART: 42.3 mmHg (ref 32.0–48.0)
PO2 ART: 42.7 mmHg — AB (ref 83.0–108.0)
pH, Arterial: 7.411 (ref 7.350–7.450)

## 2018-03-29 LAB — GLUCOSE, CAPILLARY
GLUCOSE-CAPILLARY: 225 mg/dL — AB (ref 65–99)
Glucose-Capillary: 278 mg/dL — ABNORMAL HIGH (ref 65–99)
Glucose-Capillary: 194 mg/dL — ABNORMAL HIGH (ref 65–99)
Glucose-Capillary: 230 mg/dL — ABNORMAL HIGH (ref 65–99)

## 2018-03-29 LAB — TROPONIN I

## 2018-03-29 LAB — RAPID URINE DRUG SCREEN, HOSP PERFORMED
AMPHETAMINES: NOT DETECTED
BENZODIAZEPINES: NOT DETECTED
Barbiturates: NOT DETECTED
Cocaine: NOT DETECTED
OPIATES: NOT DETECTED
Tetrahydrocannabinol: NOT DETECTED

## 2018-03-29 LAB — BASIC METABOLIC PANEL
ANION GAP: 9 (ref 5–15)
BUN: 7 mg/dL (ref 6–20)
CO2: 22 mmol/L (ref 22–32)
Calcium: 7.8 mg/dL — ABNORMAL LOW (ref 8.9–10.3)
Chloride: 106 mmol/L (ref 101–111)
Creatinine, Ser: 0.4 mg/dL — ABNORMAL LOW (ref 0.44–1.00)
GLUCOSE: 265 mg/dL — AB (ref 65–99)
Potassium: 3.7 mmol/L (ref 3.5–5.1)
Sodium: 137 mmol/L (ref 135–145)

## 2018-03-29 LAB — TSH: TSH: 1.297 u[IU]/mL (ref 0.350–4.500)

## 2018-03-29 LAB — BRAIN NATRIURETIC PEPTIDE: B NATRIURETIC PEPTIDE 5: 48.1 pg/mL (ref 0.0–100.0)

## 2018-03-29 LAB — ECHOCARDIOGRAM COMPLETE

## 2018-03-29 MED ORDER — IPRATROPIUM-ALBUTEROL 0.5-2.5 (3) MG/3ML IN SOLN
3.0000 mL | Freq: Four times a day (QID) | RESPIRATORY_TRACT | Status: DC
  Administered 2018-03-29 – 2018-03-30 (×5): 3 mL via RESPIRATORY_TRACT
  Filled 2018-03-29 (×5): qty 3

## 2018-03-29 MED ORDER — GUAIFENESIN-DM 100-10 MG/5ML PO SYRP
5.0000 mL | ORAL_SOLUTION | ORAL | Status: DC | PRN
  Administered 2018-03-29 – 2018-03-31 (×4): 5 mL via ORAL
  Filled 2018-03-29 (×4): qty 5

## 2018-03-29 MED ORDER — NITROGLYCERIN 0.4 MG SL SUBL
SUBLINGUAL_TABLET | SUBLINGUAL | Status: AC
  Administered 2018-03-29: 0.4 mg via SUBLINGUAL
  Filled 2018-03-29: qty 1

## 2018-03-29 MED ORDER — NITROGLYCERIN 0.4 MG SL SUBL
0.4000 mg | SUBLINGUAL_TABLET | Freq: Once | SUBLINGUAL | Status: AC
  Administered 2018-03-29: 0.4 mg via SUBLINGUAL

## 2018-03-29 MED ORDER — INSULIN ASPART PROT & ASPART (70-30 MIX) 100 UNIT/ML ~~LOC~~ SUSP
25.0000 [IU] | Freq: Two times a day (BID) | SUBCUTANEOUS | Status: DC
  Administered 2018-03-29 – 2018-03-30 (×3): 25 [IU] via SUBCUTANEOUS

## 2018-03-29 NOTE — Progress Notes (Signed)
  Echocardiogram 2D Echocardiogram has been performed.  Holly Ortiz 03/29/2018, 12:43 PM

## 2018-03-29 NOTE — Progress Notes (Addendum)
Patient Demographics:    Holly Ortiz, is a 52 y.o. female, DOB - 08/24/1966, WEX:[id number]  Admit date - 03/28/2018   Admitting Physician Paulette Rockford Denton Brick, MD  Outpatient Primary MD for the patient is Mack Hook, MD  LOS - 0   Chief Complaint  Patient presents with  . Shortness of Breath  . Allergic Reaction        Subjective:    Holly Ortiz today has no fevers, no emesis,  No chest pain, husband and daughter at bedside, questions answered, tachypnea and dyspnea on exertion persists, O2 sats dropped down to 81% on room air with ambulation, rash has resolved  Assessment  & Plan :    Principal Problem:   DKA (diabetic ketoacidoses) ([place]) Active Problems:   Hyperglycemia   Tachycardia   Urticaria   1)Acute Hypoxic Respiratory Failure-etiology unclear, CTA chest reviewed with radiologist, no evidence of pneumonia, no effusions,  no PE, patient has mild bronchiolitis, echocardiogram with preserved EF of 55 to 60% without significant regional wall abnormalities, or valvular abnormalities, ABG with PO2 of 42 and O2 sats of 79% on room air,  give supplemental oxygen.  Patient is a non-smoker . pulmonary critical care consult requested, unable to use albuterol due to significant tachycardia, reluctant to use steroids due to resolving DKA  2)Mild DKA-on admission blood sugar is 470, bicarb is 19, anion gap is 16, patient admits to noncompliance with diet and lifestyle, hemoglobin A1c in February 2019 was 12.1,  overall acidosis/DKA has resolved, increase 70/30 insulin to 25 units twice daily, continue high-dose sliding scale coverage  2)Persistent Tachycardia and Tachypnea-CTA chest without PE, TSH within normal limits,  echocardiogram with preserved EF of 55 to 60% without significant regional wall abnormalities, or valvular abnormalities, suspect tachycardia and tachypnea are secondary to  hypoxia  3)Recurrent Urticaria --resolved with antihistamines, continue H2 blockers and singular , avoid steroids at this time given elevated blood sugars, no concerns about angioedema type symptoms at this time, patient has had extensive work-up in the past for Urticaria (since 2007) including allergy and immunology evaluation as well as dermatology evaluation with no definite etiology identified  4)HTN- c/n amlodipine,   may use IV Hydralazine 10 mg  Every 4 hours Prn for systolic blood pressure over 160 mmhg  Code Status : full code   Disposition Plan  : home  Consults  :  PCCM  DVT Prophylaxis  :    Heparin   Lab Results  Component Value Date   PLT 263 03/28/2018    Inpatient Medications  Scheduled Meds: . amLODipine  5 mg Oral Daily  . aspirin EC  81 mg Oral Daily  . atorvastatin  20 mg Oral Daily  . escitalopram  20 mg Oral Daily  . famotidine  20 mg Oral BID  . guaiFENesin  1,200 mg Oral BID  . heparin  5,000 Units Subcutaneous Q8H  . insulin aspart  0-20 Units Subcutaneous TID WC  . insulin aspart  0-5 Units Subcutaneous QHS  . insulin aspart  6 Units Subcutaneous TID WC  . insulin aspart protamine- aspart  22 Units Subcutaneous BID WC  . ipratropium-albuterol  3 mL Nebulization Q6H  . loratadine  10 mg Oral Daily  . mouth rinse  15 mL Mouth Rinse BID  . montelukast  10 mg Oral QHS  . sodium chloride flush  3 mL Intravenous Q12H   Continuous Infusions: . sodium chloride     PRN Meds:.sodium chloride, acetaminophen **OR** acetaminophen, diphenhydrAMINE, guaiFENesin-dextromethorphan, ondansetron **OR** ondansetron (ZOFRAN) IV, polyethylene glycol, sodium chloride flush, traZODone    Anti-infectives (From admission, onward)   None        Objective:   Vitals:   03/29/18 0954 03/29/18 1502 03/29/18 1503 03/29/18 1716  BP: (!) 114/42 (!) 168/72  (!) 156/70  Pulse: (!) 108 (!) 123  (!) 118  Resp: 20 20  20   Temp: 100 F (37.8 C) 99.6 F (37.6 C)  98.9  F (37.2 C)  TempSrc: Oral Oral  Oral  SpO2: 94% 90% 90% 92%    Wt Readings from Last 3 Encounters:  01/23/18 81.6 kg (180 lb)  01/05/18 86 kg (189 lb 8 oz)  12/18/17 82.6 kg (182 lb)     Intake/Output Summary (Last 24 hours) at 03/29/2018 1732 Last data filed at 03/29/2018 1700 Gross per 24 hour  Intake 5830.5 ml  Output 850 ml  Net 4980.5 ml     Physical Exam  Gen:- Awake Alert,  In no apparent distress  HEENT:- [place].AT, No sclera icterus Nose- [place] 3L/min Neck-Supple Neck,No JVD,.  Lungs-  CTAB , good air movement CV- S1, S2 normal, tachycardic (HR 130 Abd-  +ve B.Sounds, Abd Soft, No tenderness,    Extremity/Skin:- urticaria rash has resolved, no  edema,   negative Homans  psych-affect is appropriate, oriented x3 Neuro-no new focal deficits, no tremors   Data Review:   Micro Results No results found for this or any previous visit (from the past 240 hour(s)).  Radiology Reports Dg Chest 2 View  Result Date: 03/28/2018 CLINICAL DATA:  History of hives. EXAM: CHEST - 2 VIEW COMPARISON:  01/03/2018. FINDINGS: Mediastinum and hilar structures normal. Heart size normal. Lungs are clear. No pleural effusion or pneumothorax. No acute bony abnormality. IMPRESSION: No acute cardiopulmonary disease. Electronically Signed   By: Marcello Moores  Register   On: 03/28/2018 09:47   Ct Angio Chest Pe W And/or Wo Contrast  Result Date: 03/28/2018 CLINICAL DATA:  Chronic cough. History of thighs for several years. Patient started breaking out last evening and became dyspneic. EXAM: CT ANGIOGRAPHY CHEST WITH CONTRAST TECHNIQUE: Multidetector CT imaging of the chest was performed using the standard protocol during bolus administration of intravenous contrast. Multiplanar CT image reconstructions and MIPs were obtained to evaluate the vascular anatomy. CONTRAST:  168mL ISOVUE-370 IOPAMIDOL (ISOVUE-370) INJECTION 76% COMPARISON:  04/01/2007 FINDINGS: Cardiovascular: The study is of quality for the  evaluation of pulmonary embolism. There are no filling defects in the central, lobar, segmental or subsegmental pulmonary artery branches to suggest acute pulmonary embolism. Great vessels are normal in course and caliber. Normal heart size. No significant pericardial fluid/thickening. Minimal aortic atherosclerosis. Mediastinum/Nodes: No discrete thyroid nodules. Unremarkable esophagus. No pathologically enlarged axillary, mediastinal or hilar lymph nodes. Mild gas filled distention of the esophagus without significant mural thickening. Small hiatal hernia. Lungs/Pleura: Respiratory motion artifacts site limit assessment. No pulmonary consolidation. No pneumothorax. No pleural effusion. Suggestion of mild tree-in-bud opacities bilaterally which can be seen in bronchiolitis. Upper abdomen: Hepatic steatosis.  No mass or biliary dilatation. Musculoskeletal:  No aggressive appearing focal osseous lesions. Review of the MIP images confirms the above findings. IMPRESSION: 1. No acute pulmonary embolus. 2. Mild diffuse tree-in-bud opacities noted bilaterally compatible with bronchiolitis. 3. Hepatic steatosis.  Aortic Atherosclerosis (ICD10-I70.0). Electronically Signed   By: Ashley Royalty M.D.   On: 03/28/2018 18:12     CBC Recent Labs  Lab 03/28/18 0904  WBC 6.9  HGB 13.9  HCT 43.5  PLT 263  MCV 78.0  MCH 24.9*  MCHC 32.0  RDW 15.6*   FIO2  21.00    pH, Arterial 7.350 - 7.450 7.411    pCO2 arterial 32.0 - 48.0 mmHg 42.3    pO2, Arterial 83.0 - 108.0 mmHg 42.7Low     Bicarbonate 20.0 - 28.0 mmol/L 26.3  23.5   Acid-Base Excess 0.0 - 2.0 mmol/L 2.1High     O2 Saturation % 79.3  77.0   Patient temperature  98.6  HIDE   Collection site  RIGHT BRACHIAL      .  Chemistries  Recent Labs  Lab 03/28/18 0904 03/29/18 0727  NA 131* 137  K 4.4 3.7  CL 96* 106  CO2 19* 22  GLUCOSE 470* 265*  BUN 13 7  CREATININE 0.77 0.40*  CALCIUM 8.9 7.8*    ------------------------------------------------------------------------------------------------------------------ No results for input(s): CHOL, HDL, LDLCALC, TRIG, CHOLHDL, LDLDIRECT in the last 72 hours.  Lab Results  Component Value Date   HGBA1C 12.1 (H) 01/04/2018   ------------------------------------------------------------------------------------------------------------------ Recent Labs    03/29/18 0727  TSH 1.297   ------------------------------------------------------------------------------------------------------------------ No results for input(s): VITAMINB12, FOLATE, FERRITIN, TIBC, IRON, RETICCTPCT in the last 72 hours.  Coagulation profile No results for input(s): INR, PROTIME in the last 168 hours.  No results for input(s): DDIMER in the last 72 hours.  Cardiac Enzymes No results for input(s): CKMB, TROPONINI, MYOGLOBIN in the last 168 hours.  Invalid input(s): CK ------------------------------------------------------------------------------------------------------------------ No results found for: BNP   Roxan Hockey M.D on 03/29/2018 at 5:32 PM  Between 7am to 7pm - Pager - [phone]  After 7pm go to [email or web address] - password [email or web address]  [facility] -  Office  [phone]   Voice Recognition Viviann Spare dictation system was used to create this note, attempts have been made to correct errors. Please contact the author with questions and/or clarifications.

## 2018-03-29 NOTE — Progress Notes (Signed)
Patient complaining of left sided chest pressure and shortness of breath.  VSS.  Dr. Nadean Corwin paged, received orders.  EKG obtained, 1 nitro given.  Chest pain relieved with 1 nitro SL.  Will continue to monitor.

## 2018-03-29 NOTE — Progress Notes (Signed)
SATURATION QUALIFICATIONS: (This note is used to comply with regulatory documentation for home oxygen)  Patient Saturations on Room Air at Rest = 92%  Patient Saturations on Room Air while Ambulating = 81-83%  Patient Saturations on 3 Liters of oxygen while Ambulating = 91-93%  Please briefly explain why patient needs home oxygen:

## 2018-03-29 NOTE — Progress Notes (Addendum)
Inpatient Diabetes Program Recommendations  AACE/ADA: New Consensus Statement on Inpatient Glycemic Control (2015)  Target Ranges:  Prepandial:   less than 140 mg/dL      Peak postprandial:   less than 180 mg/dL (1-2 hours)      Critically ill patients:  140 - 180 mg/dL   Lab Results  Component Value Date   GLUCAP 278 (H) 03/29/2018   HGBA1C 12.1 (H) 01/04/2018    Review of Glycemic Control Results for BUNA, CUPPETT (MRN [id number]) as of 03/29/2018 11:31  Ref. Range 03/28/2018 16:26 03/28/2018 21:22 03/29/2018 07:24  Glucose-Capillary Latest Ref Range: 65 - 99 mg/dL 459 (H) 327 (H) 278 (H)   Diabetes history: Type 2 DM Outpatient Diabetes medications: Per PCP on Novolin 70/30 17 units BID Current orders for Inpatient glycemic control: Novolog 0-20 units TID, Novolog 0-5 units QHS, Novolog 6 units TID, Novolog 70/30 22 units BID  Inpatient Diabetes Program Recommendations:    Noted patient has meal coverage and 70/30 orders. Would recommend discontinuing Novolog 6 units TID as the 70/30 already has the short acting portion in it to cover meals.   Additionally, home dose is Novolin 70/30 17 units BID, putting total daily basal dose at 24 units. Current inpatient total daily dose is 30.8, which may cause lows with current regimen. Would consider decreasing Novolog 70/30 to 18 units BID.  Text page sent.  Thanks, Bronson Curb, MSN, RNC-OB Diabetes Coordinator [phone] (8a-5p)   [place] with patient regarding home management; remembered patient from last hospitalization.  Patient states, "I take the injections the way I am suppose to, however, I missed two doses because I was so sick."   Education provided to patient on sick day rules. Reviewed the importance of taking insulin, how quickly the body responds by making ketones and breaking down other means of energy, the patho of why she needs insulin. Patient states, "If I feel bad it is hard to take insulin,  because it makes me feel worse."   Reviewed patient's current A1c of 12.1%. Explained what a A1c is and what it measures. Also reviewed goal A1c with patient, importance of good glucose control @ home, and blood sugar goals. Discussed long term co-morbidities associated with poor control, vascular changes that occur, and the importance of establishing control. Assuming this A1C and recent comments by PCP, I am doubting administration of insulin as often as reported.  Reviewed diet and total carbs per meal. Patient says, "I have been eating well with diabetes." When asked further she denies carb counting and eating high carb foods.  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 Patient states, Everything will be better when I have insurance." Discussed the plan for insulin administration until that time. Her plan is to continue to follow up with PCP, then once insurance coverage starts, find endocrinologist. Patient has the supplies that she needs. Patient reports checking BS twice daily.   Patient denies any issues with obtaining medications, transportation or additional needs with diabetes management.   Thanks, Bronson Curb, MSN, RNC-OB Diabetes Coordinator [phone] (8a-5p)

## 2018-03-29 NOTE — Care Management Note (Signed)
Case Management Note  Patient Details  Name: Rhiana Morash MRN: [id number] Date of Birth: 04/03/1966  Subjective/Objective:      Pt admitted with DKA. She is from home with spouse. Pt has no insurance but has been going to [facility] for PCP. She has been paying out of pocket for her meds.               Action/Plan: CM consulted for a list of Endocrinologist. CM provided this to her. CM is following for d/c meds to see if assistance will be needed.   Expected Discharge Date:  03/29/18               Expected Discharge Plan:  Home/Self Care  In-House Referral:     Discharge planning Services  CM Consult  Post Acute Care Choice:    Choice offered to:     DME Arranged:    DME Agency:     HH Arranged:    HH Agency:     Status of Service:  In process, will continue to follow  If discussed at Long Length of Stay Meetings, dates discussed:    Additional Comments:  Pollie Friar, RN 03/29/2018, 4:49 PM

## 2018-03-29 NOTE — Consult Note (Signed)
Name: Holly Ortiz MRN: [id number] DOB: 1966/09/03    ADMISSION DATE:  03/28/2018 CONSULTATION DATE:  03/29/2018   REFERRING MD :  Joesph Fillers [facility]  CHIEF COMPLAINT:  Hypoxia   HISTORY OF PRESENT ILLNESS: 52 year old never smoker with uncontrolled diabetes admitted 4/25 for urticaria .  She has a history of recurrent urticaria and breaking out into hives since 2007, no clear etiology found.  She was noted to be hypoxic in the emergency room and placed on oxygen.  She reports URI symptoms starting 4 days ago with cough productive sputum but she did not notice the color.  She had low-grade temperature to 100.  She was noted to be tachycardic and tachypneic with borderline oxygen saturations and CT Angie of the chest was performed which did not show any pulmonary embolism, showed mild tree-in-bud opacities suggestive of bronchiolitis per radiology. She was noted to have high sugars up to 500 with elevated anion gap and was treated with insulin drip.  Anion gap is now resolved and sugars are down to 200 range. We are consulted for hypoxia unclear etiology.  She reports occasional wheezing and mild shortness of breath.  She has been persistently tachycardic.  She notes that having feet swelling have subsided.  She was given Benadryl, steroids were not given due to high sugars  She reports childhood history of asthma but this went away as a young adult.  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  She has lived in [place] until few months ago when she moved from   STUDIES:  Ct chest 4/25 >>   No PE, ?bronchiolitis EKG-sinus tachycardia     PAST MEDICAL HISTORY :   has a past medical history of Allergy, 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-I), Asthma, Diabetes mellitus, Hyperlipidemia, Hypertension, Urticaria, chronic, and UTI (lower urinary tract infection).  has a past surgical  history that includes Cataract extraction, bilateral (Bilateral, 2014); Tubal ligation (1995); and Soft tissue mass excision (Right, 2016). Prior to Admission medications   Medication Sig Start Date End Date Taking? Authorizing Provider  amLODipine (NORVASC) 5 MG tablet Take 1 tablet (5 mg total) by mouth daily. 01/18/18  Yes Mack Hook, MD  aspirin 81 MG tablet Take 81 mg by mouth daily.   Yes [provider]  atorvastatin (LIPITOR) 20 MG tablet Take 1 tablet (20 mg total) by mouth daily. 12/18/17 12/18/18 Yes Mack Hook, MD  escitalopram (LEXAPRO) 20 MG tablet Take 1 tablet (20 mg total) by mouth daily. 12/18/17  Yes Mack Hook, MD  montelukast (SINGULAIR) 10 MG tablet Take 10 mg by mouth at bedtime.   Yes [provider]  ondansetron (ZOFRAN ODT) 4 MG disintegrating tablet Take 1 tablet (4 mg total) by mouth every 8 (eight) hours as needed for nausea or vomiting. 01/05/18  Yes Rai, Vernelle Emerald, MD   Allergies  Allergen Reactions  . Actos [Pioglitazone Hydrochloride] Other (See Comments)    Wt gain  . Lisinopril     Angioedema, including likely GI involvement    FAMILY HISTORY:  family history includes COPD in her father; Cancer in her sister; Colon cancer in her mother; Diabetes in her father and sister; Heart disease in her other; Hyperlipidemia in her other; Hypertension in her brother, father, mother, and sister. SOCIAL HISTORY:  reports that she has never smoked. She has never used smokeless tobacco. She reports that she does not  drink alcohol or use drugs.  REVIEW OF SYSTEMS:   Constitutional: Negative for chills, weight lossand diaphoresis.  HENT: Negative for hearing loss, ear pain, nosebleeds, congestion, sore throat, neck pain, tinnitus and ear discharge.   Eyes: Negative for blurred vision, double vision, photophobia, pain, discharge and redness.  Respiratory: Negative for hemoptysis and stridor.   Cardiovascular: Negative for chest pain,  palpitations, orthopnea, claudication and PND.  Gastrointestinal: Negative for heartburn, nausea, vomiting, abdominal pain, diarrhea, constipation, blood in stool and melena.  Genitourinary: Negative for dysuria, urgency, frequency, hematuria and flank pain.  Musculoskeletal: Negative for myalgias, back pain, joint pain and falls.  Skin: Negative for itching and rash.  Neurological: Negative for dizziness, tingling, tremors, sensory change, speech change, focal weakness, seizures, loss of consciousness, weakness and headaches.  Endo/Heme/Allergies: Negative for environmental allergies and polydipsia. Does not bruise/bleed easily.  SUBJECTIVE:   VITAL SIGNS: Temp:  [99 F (37.2 C)-100 F (37.8 C)] 99.6 F (37.6 C) (04/26 1502) Pulse Rate:  [105-131] 123 (04/26 1502) Resp:  [17-31] 20 (04/26 1502) BP: (114-168)/(42-72) 168/72 (04/26 1502) SpO2:  [90 %-99 %] 90 % (04/26 1503)  PHYSICAL EXAMINATION: Gen. Pleasant, obese, in no distress, normal affect ENT - dry mucosa, no post nasal drip, class 2 airway Neck: No JVD, no thyromegaly, no carotid bruits Lungs: no use of accessory muscles, no dullness to percussion, decreased without rales or rhonchi  Cardiovascular: Rhythm regular, heart sounds  tachy, no murmurs or gallops, no peripheral edema Abdomen: soft and non-tender, no hepatosplenomegaly, BS normal. Musculoskeletal: No deformities, no cyanosis or clubbing Neuro:  alert, non focal, no tremors   Recent Labs  Lab 03/28/18 0904 03/29/18 0727  NA 131* 137  K 4.4 3.7  CL 96* 106  CO2 19* 22  BUN 13 7  CREATININE 0.77 0.40*  GLUCOSE 470* 265*   Recent Labs  Lab 03/28/18 0904  HGB 13.9  HCT 43.5  WBC 6.9  PLT 263   Dg Chest 2 View  Result Date: 03/28/2018 CLINICAL DATA:  History of hives. EXAM: CHEST - 2 VIEW COMPARISON:  01/03/2018. FINDINGS: Mediastinum and hilar structures normal. Heart size normal. Lungs are clear. No pleural effusion or pneumothorax. No acute bony  abnormality. IMPRESSION: No acute cardiopulmonary disease. Electronically Signed   By: Marcello Moores  Register   On: 03/28/2018 09:47   Ct Angio Chest Pe W And/or Wo Contrast  Result Date: 03/28/2018 CLINICAL DATA:  Chronic cough. History of thighs for several years. Patient started breaking out last evening and became dyspneic. EXAM: CT ANGIOGRAPHY CHEST WITH CONTRAST TECHNIQUE: Multidetector CT imaging of the chest was performed using the standard protocol during bolus administration of intravenous contrast. Multiplanar CT image reconstructions and MIPs were obtained to evaluate the vascular anatomy. CONTRAST:  192mL ISOVUE-370 IOPAMIDOL (ISOVUE-370) INJECTION 76% COMPARISON:  04/01/2007 FINDINGS: Cardiovascular: The study is of quality for the evaluation of pulmonary embolism. There are no filling defects in the central, lobar, segmental or subsegmental pulmonary artery branches to suggest acute pulmonary embolism. Great vessels are normal in course and caliber. Normal heart size. No significant pericardial fluid/thickening. Minimal aortic atherosclerosis. Mediastinum/Nodes: No discrete thyroid nodules. Unremarkable esophagus. No pathologically enlarged axillary, mediastinal or hilar lymph nodes. Mild gas filled distention of the esophagus without significant mural thickening. Small hiatal hernia. Lungs/Pleura: Respiratory motion artifacts site limit assessment. No pulmonary consolidation. No pneumothorax. No pleural effusion. Suggestion of mild tree-in-bud opacities bilaterally which can be seen in bronchiolitis. Upper abdomen: Hepatic steatosis.  No mass or biliary dilatation.  Musculoskeletal:  No aggressive appearing focal osseous lesions. Review of the MIP images confirms the above findings. IMPRESSION: 1. No acute pulmonary embolus. 2. Mild diffuse tree-in-bud opacities noted bilaterally compatible with bronchiolitis. 3. Hepatic steatosis. Aortic Atherosclerosis (ICD10-I70.0). Electronically Signed   By: Ashley Royalty M.D.   On: 03/28/2018 18:12    ASSESSMENT / PLAN:  Hypoxia of unclear etiology in this never smoker, CT angiogram negative for pulmonary embolism-does not have any infiltrates to explain, no bronchospasm at current to attribute this to airway  Disease. Echo pending and this will help to rule out pulmonary hypertension as a cause, can also check BNP.  If work-up otherwise negative and if no improvement then would consider bubble study to rule out intracardiac shunt. Can attribute to acute bronchitis -but cannot give her steroids currently high sugars, also noted no bronchospasm.but this is needed, nebulizer treatment limited by tachycardia  Will follow,   Kara Mead MD. FCCP. [facility] Pager [phone] If no response call [phone]    03/29/2018, 4:32 PM

## 2018-03-29 NOTE — Progress Notes (Addendum)
Dr. Denton Brick made aware of ABG results.  Patient placed back on 3L oxygen via [place]

## 2018-03-30 DIAGNOSIS — J9811 Atelectasis: Secondary | ICD-10-CM

## 2018-03-30 DIAGNOSIS — J219 Acute bronchiolitis, unspecified: Secondary | ICD-10-CM

## 2018-03-30 DIAGNOSIS — J9601 Acute respiratory failure with hypoxia: Secondary | ICD-10-CM

## 2018-03-30 LAB — RESPIRATORY PANEL BY PCR
ADENOVIRUS-RVPPCR: NOT DETECTED
Bordetella pertussis: NOT DETECTED
CHLAMYDOPHILA PNEUMONIAE-RVPPCR: NOT DETECTED
CORONAVIRUS 229E-RVPPCR: NOT DETECTED
CORONAVIRUS OC43-RVPPCR: NOT DETECTED
Coronavirus HKU1: NOT DETECTED
Coronavirus NL63: NOT DETECTED
Influenza A: NOT DETECTED
Influenza B: NOT DETECTED
MYCOPLASMA PNEUMONIAE-RVPPCR: NOT DETECTED
Metapneumovirus: DETECTED — AB
PARAINFLUENZA VIRUS 1-RVPPCR: NOT DETECTED
PARAINFLUENZA VIRUS 4-RVPPCR: NOT DETECTED
Parainfluenza Virus 2: NOT DETECTED
Parainfluenza Virus 3: NOT DETECTED
RESPIRATORY SYNCYTIAL VIRUS-RVPPCR: NOT DETECTED
Rhinovirus / Enterovirus: NOT DETECTED

## 2018-03-30 LAB — BLOOD GAS, ARTERIAL
ACID-BASE EXCESS: 3.6 mmol/L — AB (ref 0.0–2.0)
Bicarbonate: 27.9 mmol/L (ref 20.0–28.0)
DRAWN BY: 105521
O2 Content: 4 L/min
O2 Saturation: 89.8 %
PCO2 ART: 44.7 mmHg (ref 32.0–48.0)
PH ART: 7.412 (ref 7.350–7.450)
Patient temperature: 98.6
pO2, Arterial: 57.5 mmHg — ABNORMAL LOW (ref 83.0–108.0)

## 2018-03-30 LAB — BASIC METABOLIC PANEL
ANION GAP: 10 (ref 5–15)
BUN: 7 mg/dL (ref 6–20)
CALCIUM: 8 mg/dL — AB (ref 8.9–10.3)
CO2: 28 mmol/L (ref 22–32)
Chloride: 99 mmol/L — ABNORMAL LOW (ref 101–111)
Creatinine, Ser: 0.4 mg/dL — ABNORMAL LOW (ref 0.44–1.00)
GFR calc Af Amer: >60 mL/min (ref >60)
GLUCOSE: 177 mg/dL — AB (ref 65–99)
Potassium: 2.9 mmol/L — ABNORMAL LOW (ref 3.5–5.1)
Sodium: 137 mmol/L (ref 135–145)

## 2018-03-30 LAB — GLUCOSE, CAPILLARY
GLUCOSE-CAPILLARY: 241 mg/dL — AB (ref 65–99)
GLUCOSE-CAPILLARY: 184 mg/dL — AB (ref 65–99)
GLUCOSE-CAPILLARY: 172 mg/dL — AB (ref 65–99)
Glucose-Capillary: 161 mg/dL — ABNORMAL HIGH (ref 65–99)

## 2018-03-30 LAB — BRAIN NATRIURETIC PEPTIDE: B NATRIURETIC PEPTIDE 5: 39.2 pg/mL (ref 0.0–100.0)

## 2018-03-30 LAB — PROCALCITONIN: Procalcitonin: <0.1 ng/mL

## 2018-03-30 LAB — TROPONIN I

## 2018-03-30 MED ORDER — POTASSIUM CHLORIDE CRYS ER 20 MEQ PO TBCR
40.0000 meq | EXTENDED_RELEASE_TABLET | Freq: Once | ORAL | Status: AC
  Administered 2018-03-30: 40 meq via ORAL
  Filled 2018-03-30: qty 2

## 2018-03-30 MED ORDER — IPRATROPIUM-ALBUTEROL 0.5-2.5 (3) MG/3ML IN SOLN
3.0000 mL | Freq: Three times a day (TID) | RESPIRATORY_TRACT | Status: DC
  Administered 2018-03-30 – 2018-03-31 (×3): 3 mL via RESPIRATORY_TRACT
  Filled 2018-03-30 (×3): qty 3

## 2018-03-30 NOTE — Plan of Care (Signed)
  Problem: Health Behavior/Discharge Planning: Goal: Ability to manage health-related needs will improve Outcome: Progressing   Problem: Clinical Measurements: Goal: Ability to maintain clinical measurements within normal limits will improve Outcome: Progressing   Problem: Clinical Measurements: Goal: Respiratory complications will improve Outcome: Progressing   

## 2018-03-30 NOTE — Progress Notes (Signed)
Name: Holly Ortiz MRN: [id number] DOB: 11-27-1966    ADMISSION DATE:  03/28/2018 CONSULTATION DATE:  03/30/2018   REFERRING MD :  Joesph Fillers [facility] MD  CHIEF COMPLAINT:  Hypoxia   HISTORY OF PRESENT ILLNESS: 52 year old never smoker with uncontrolled diabetes admitted 4/25 for urticaria .  She has a history of recurrent urticaria and breaking out into hives since 2007, no clear etiology found.  She was noted to be hypoxic in the emergency room and placed on oxygen.  She reports URI symptoms starting 4 days ago with cough productive sputum but she did not notice the color.  She had low-grade temperature to 100.  She was noted to be tachycardic and tachypneic with borderline oxygen saturations and CT Angie of the chest was performed which did not show any pulmonary embolism, showed mild tree-in-bud opacities suggestive of bronchiolitis per radiology. She was noted to have high sugars up to 500 with elevated anion gap and was treated with insulin drip.  Anion gap is now resolved and sugars are down to 200 range. We are consulted for hypoxia unclear etiology.  She reports occasional wheezing and mild shortness of breath.  She has been persistently tachycardic.  She notes that having feet swelling have subsided.  She was given Benadryl, steroids were not given due to high sugars  She reports childhood history of asthma but this went away as a young adult.  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  She has lived in [place] until few months ago when she moved from   STUDIES:  Ct chest 4/25 >>   No PE, ?bronchiolitis EKG-sinus tachycardia  SUBJECTIVE:  C/O SOB and some cough  VITAL SIGNS: Temp:  [98.9 F (37.2 C)-99.6 F (37.6 C)] 99.4 F (37.4 C) (04/27 1040) Pulse Rate:  [110-123] 110 (04/27 1040) Resp:  [20] 20 (04/27 1040) BP: (138-168)/(58-77) 139/58 (04/27 1040) SpO2:  [76 %-92 %] 90 % (04/27  1040)  PHYSICAL EXAMINATION: Gen. Pleasant female, resting comfortable in exam bed in NAD HEENT: [place]/AT, PERRL, EOM-I and MMM Neck: -JVD Lungs: Bibasilar crackles Cardiovascular: RRR, Nl S1/S2 and -M/R/G Abdomen: Soft, NT, ND and +BS Musculoskeletal: No deformities, no cyanosis or clubbing Neuro:  Alert and interactive, moving all ext to command   Recent Labs  Lab 03/28/18 0904 03/29/18 0727 03/30/18 0621  NA 131* 137 137  K 4.4 3.7 2.9*  CL 96* 106 99*  CO2 19* 22 28  BUN 13 7 7   CREATININE 0.77 0.40* 0.40*  GLUCOSE 470* 265* 177*   Recent Labs  Lab 03/28/18 0904  HGB 13.9  HCT 43.5  WBC 6.9  PLT 263   Ct Angio Chest Pe W And/or Wo Contrast  Result Date: 03/28/2018 CLINICAL DATA:  Chronic cough. History of thighs for several years. Patient started breaking out last evening and became dyspneic. EXAM: CT ANGIOGRAPHY CHEST WITH CONTRAST TECHNIQUE: Multidetector CT imaging of the chest was performed using the standard protocol during bolus administration of intravenous contrast. Multiplanar CT image reconstructions and MIPs were obtained to evaluate the vascular anatomy. CONTRAST:  159mL ISOVUE-370 IOPAMIDOL (ISOVUE-370) INJECTION 76% COMPARISON:  04/01/2007 FINDINGS: Cardiovascular: The study is of quality for the evaluation of pulmonary embolism. There are no filling defects in the central, lobar, segmental or subsegmental pulmonary artery branches to suggest acute pulmonary embolism. Great vessels are normal in course and caliber. Normal heart size. No significant pericardial fluid/thickening. Minimal  aortic atherosclerosis. Mediastinum/Nodes: No discrete thyroid nodules. Unremarkable esophagus. No pathologically enlarged axillary, mediastinal or hilar lymph nodes. Mild gas filled distention of the esophagus without significant mural thickening. Small hiatal hernia. Lungs/Pleura: Respiratory motion artifacts site limit assessment. No pulmonary consolidation. No pneumothorax. No  pleural effusion. Suggestion of mild tree-in-bud opacities bilaterally which can be seen in bronchiolitis. Upper abdomen: Hepatic steatosis.  No mass or biliary dilatation. Musculoskeletal:  No aggressive appearing focal osseous lesions. Review of the MIP images confirms the above findings. IMPRESSION: 1. No acute pulmonary embolus. 2. Mild diffuse tree-in-bud opacities noted bilaterally compatible with bronchiolitis. 3. Hepatic steatosis. Aortic Atherosclerosis (ICD10-I70.0). Electronically Signed   By: Ashley Royalty M.D.   On: 03/28/2018 18:12   I reviewed chest CT myself, no PE, evidence of bronchiolitis  ASSESSMENT / PLAN:  52 year old obese female presenting to PCCM with hypoxemia.  CT is negative for a PE.  Echo is negative for signs of failure.  Acute bronchiolitis signs on CT however.  Discussed with PCCM-NP.  Hypoxemia:  - Titrate O2 for sat of 92-95%  - Check BNP  Bronchiolitis: allergic vs viral  - Check PCT  - If PCT is negative will consider steroids  - RVP  Cough and sputum clearance  - IS per RT protocol  - Flutter valve  PCCM will continue to follow  Rush Farmer, M.D. [facility] Pulmonary/Critical Care Medicine. Pager: [phone]. After hours pager: [phone].  03/30/2018, 2:17 PM

## 2018-03-30 NOTE — Progress Notes (Signed)
Patient Demographics:    Holly Ortiz, is a 52 y.o. female, DOB - October 14, 1966, ZOX:[id number]  Admit date - 03/28/2018   Admitting Physician Maryssa Giampietro Denton Brick, MD  Outpatient Primary MD for the patient is Mack Hook, MD  LOS - 1   Chief Complaint  Patient presents with  . Shortness of Breath  . Allergic Reaction        Subjective:    Holly Ortiz today has no fevers, no emesis,  No chest pain, husband  at bedside, questions answered, tachypnea and dyspnea on exertion improving on oxygen, no pruritus, no further rash  Assessment  & Plan :    Principal Problem:   DKA (diabetic ketoacidoses) (HCC) Active Problems:   Hyperglycemia   Tachycardia   Urticaria   1)Acute Hypoxic Respiratory Failure- etiology unclear, CTA chest reviewed with radiologist, no evidence of pneumonia, no effusions,  no PE, patient has mild bronchiolitis, echocardiogram with preserved EF of 55 to 60% without significant regional wall abnormalities, or valvular abnormalities, ABG from 03/29/2018 on 03/30/2017 shows hypoxic respiratory failure, continue supplemental oxygen.  Patient is a non-smoker . pulmonary critical care consult appreciated, be judicious with albuterol due to significant tachycardia, reluctant to use steroids due to resolving DKA.  Check respiratory viral panel, check procalcitonin, if procalcitonin is negative may consider steroids for possible bronchial spasms/bronchiolitis  2)Mild DKA-on admission blood sugar is 470, bicarb is 19, anion gap is 16, patient admits to noncompliance with diet and lifestyle, hemoglobin A1c in February 2019 was 12.1,  overall acidosis/DKA has resolved, continue 70/30 insulin  25 units twice daily, continue high-dose sliding scale coverage, if steroid therapy is initiated above #1 patient will need her insulin dosage increase  2)Persistent Tachycardia and Tachypnea- CTA chest  without PE, TSH within normal limits,  echocardiogram with preserved EF of 55 to 60% without significant regional wall abnormalities, or valvular abnormalities, suspect tachycardia and tachypnea are secondary to hypoxia, hypoxia may be secondary to bronchospasms/bronchiolitis.  Please see #1 above  3)Recurrent Urticaria --resolved with antihistamines, continue H2 blockers and singular , avoid steroids at this time given elevated blood sugars, no concerns about angioedema type symptoms at this time, patient has had extensive work-up in the past for Urticaria (since 2007) including allergy and immunology evaluation as well as dermatology evaluation with no definite etiology identified  4)HTN- c/n amlodipine,   may use IV Hydralazine 10 mg  Every 4 hours Prn for systolic blood pressure over 160 mmhg  Code Status : Full code  Disposition Plan  : home  Consults  :  PCCM  DVT Prophylaxis  :    Heparin   Lab Results  Component Value Date   PLT 263 03/28/2018   Inpatient Medications  Scheduled Meds: . amLODipine  5 mg Oral Daily  . aspirin EC  81 mg Oral Daily  . atorvastatin  20 mg Oral Daily  . escitalopram  20 mg Oral Daily  . famotidine  20 mg Oral BID  . guaiFENesin  1,200 mg Oral BID  . heparin  5,000 Units Subcutaneous Q8H  . insulin aspart  0-20 Units Subcutaneous TID WC  . insulin aspart  0-5 Units Subcutaneous QHS  . insulin aspart  6 Units Subcutaneous TID WC  . insulin  aspart protamine- aspart  25 Units Subcutaneous BID WC  . ipratropium-albuterol  3 mL Nebulization TID  . loratadine  10 mg Oral Daily  . mouth rinse  15 mL Mouth Rinse BID  . montelukast  10 mg Oral QHS  . sodium chloride flush  3 mL Intravenous Q12H   Continuous Infusions: . sodium chloride     PRN Meds:.sodium chloride, acetaminophen **OR** acetaminophen, diphenhydrAMINE, guaiFENesin-dextromethorphan, ondansetron **OR** ondansetron (ZOFRAN) IV, polyethylene glycol, sodium chloride flush,  traZODone    Anti-infectives (From admission, onward)   None        Objective:   Vitals:   03/30/18 0513 03/30/18 0527 03/30/18 1026 03/30/18 1040  BP: 138/77   (!) 139/58  Pulse: (!) 111   (!) 110  Resp:    20  Temp: 99.4 F (37.4 C)   99.4 F (37.4 C)  TempSrc: Oral   Oral  SpO2: (!) 76% 90% 92% 90%    Wt Readings from Last 3 Encounters:  01/23/18 81.6 kg (180 lb)  01/05/18 86 kg (189 lb 8 oz)  12/18/17 82.6 kg (182 lb)     Intake/Output Summary (Last 24 hours) at 03/30/2018 1540 Last data filed at 03/30/2018 0900 Gross per 24 hour  Intake 480 ml  Output 300 ml  Net 180 ml     Physical Exam  Gen:- Awake Alert,  In no apparent distress, speaking in sentences HEENT:- [place].AT, No sclera icterus Nose- [place] 3L/min Neck-Supple Neck,No JVD,.  Lungs-  CTAB , good air movement CV- S1, S2 normal, tachycardic (HR 110s Abd-  +ve B.Sounds, Abd Soft, No tenderness,    Extremity/Skin:- urticaria rash has resolved, no  edema,   negative Homans  psych-affect is appropriate, oriented x3 Neuro-no new focal deficits, no tremors   Data Review:   Micro Results No results found for this or any previous visit (from the past 240 hour(s)).  Radiology Reports Dg Chest 2 View  Result Date: 03/28/2018 CLINICAL DATA:  History of hives. EXAM: CHEST - 2 VIEW COMPARISON:  01/03/2018. FINDINGS: Mediastinum and hilar structures normal. Heart size normal. Lungs are clear. No pleural effusion or pneumothorax. No acute bony abnormality. IMPRESSION: No acute cardiopulmonary disease. Electronically Signed   By: Marcello Moores  Register   On: 03/28/2018 09:47   Ct Angio Chest Pe W And/or Wo Contrast  Result Date: 03/28/2018 CLINICAL DATA:  Chronic cough. History of thighs for several years. Patient started breaking out last evening and became dyspneic. EXAM: CT ANGIOGRAPHY CHEST WITH CONTRAST TECHNIQUE: Multidetector CT imaging of the chest was performed using the standard protocol during bolus  administration of intravenous contrast. Multiplanar CT image reconstructions and MIPs were obtained to evaluate the vascular anatomy. CONTRAST:  148mL ISOVUE-370 IOPAMIDOL (ISOVUE-370) INJECTION 76% COMPARISON:  04/01/2007 FINDINGS: Cardiovascular: The study is of quality for the evaluation of pulmonary embolism. There are no filling defects in the central, lobar, segmental or subsegmental pulmonary artery branches to suggest acute pulmonary embolism. Great vessels are normal in course and caliber. Normal heart size. No significant pericardial fluid/thickening. Minimal aortic atherosclerosis. Mediastinum/Nodes: No discrete thyroid nodules. Unremarkable esophagus. No pathologically enlarged axillary, mediastinal or hilar lymph nodes. Mild gas filled distention of the esophagus without significant mural thickening. Small hiatal hernia. Lungs/Pleura: Respiratory motion artifacts site limit assessment. No pulmonary consolidation. No pneumothorax. No pleural effusion. Suggestion of mild tree-in-bud opacities bilaterally which can be seen in bronchiolitis. Upper abdomen: Hepatic steatosis.  No mass or biliary dilatation. Musculoskeletal:  No aggressive appearing focal osseous lesions. Review  of the MIP images confirms the above findings. IMPRESSION: 1. No acute pulmonary embolus. 2. Mild diffuse tree-in-bud opacities noted bilaterally compatible with bronchiolitis. 3. Hepatic steatosis. Aortic Atherosclerosis (ICD10-I70.0). Electronically Signed   By: Ashley Royalty M.D.   On: 03/28/2018 18:12     CBC Recent Labs  Lab 03/28/18 0904  WBC 6.9  HGB 13.9  HCT 43.5  PLT 263  MCV 78.0  MCH 24.9*  MCHC 32.0  RDW 15.6*   FIO2  21.00    pH, Arterial 7.350 - 7.450 7.411    pCO2 arterial 32.0 - 48.0 mmHg 42.3    pO2, Arterial 83.0 - 108.0 mmHg 42.7Low     Bicarbonate 20.0 - 28.0 mmol/L 26.3  23.5   Acid-Base Excess 0.0 - 2.0 mmol/L 2.1High     O2 Saturation % 79.3  77.0   Patient temperature  98.6  HIDE    Collection site  RIGHT BRACHIAL      Chemistries  Recent Labs  Lab 03/28/18 0904 03/29/18 0727 03/30/18 0621  NA 131* 137 137  K 4.4 3.7 2.9*  CL 96* 106 99*  CO2 19* 22 28  GLUCOSE 470* 265* 177*  BUN 13 7 7   CREATININE 0.77 0.40* 0.40*  CALCIUM 8.9 7.8* 8.0*   ------------------------------------------------------------------------------------------------------------------ No results for input(s): CHOL, HDL, LDLCALC, TRIG, CHOLHDL, LDLDIRECT in the last 72 hours.  Lab Results  Component Value Date   HGBA1C 12.1 (H) 01/04/2018   ------------------------------------------------------------------------------------------------------------------ Recent Labs    03/29/18 0727  TSH 1.297   ------------------------------------------------------------------------------------------------------------------ No results for input(s): VITAMINB12, FOLATE, FERRITIN, TIBC, IRON, RETICCTPCT in the last 72 hours.  Coagulation profile No results for input(s): INR, PROTIME in the last 168 hours.  No results for input(s): DDIMER in the last 72 hours.  Cardiac Enzymes Recent Labs  Lab 03/29/18 1702 03/30/18 0016 03/30/18 0621  TROPONINI <0.03 <0.03 <0.03   ------------------------------------------------------------------------------------------------------------------    Component Value Date/Time   BNP 39.2 03/30/2018 1425   Lilly Gasser M.D on 03/30/2018 at 3:40 PM  Between 7am to 7pm - Pager - [phone]  After 7pm go to [email or web address] - password [email or web address]  [facility] -  Office  [phone]   Voice Recognition Viviann Spare dictation system was used to create this note, attempts have been made to correct errors. Please contact the author with questions and/or clarifications.

## 2018-03-31 DIAGNOSIS — R05 Cough: Secondary | ICD-10-CM

## 2018-03-31 DIAGNOSIS — J189 Pneumonia, unspecified organism: Secondary | ICD-10-CM

## 2018-03-31 LAB — GLUCOSE, CAPILLARY
Glucose-Capillary: 183 mg/dL — ABNORMAL HIGH (ref 65–99)
Glucose-Capillary: 226 mg/dL — ABNORMAL HIGH (ref 65–99)
Glucose-Capillary: 165 mg/dL — ABNORMAL HIGH (ref 65–99)
Glucose-Capillary: 253 mg/dL — ABNORMAL HIGH (ref 65–99)

## 2018-03-31 MED ORDER — LEVALBUTEROL HCL 0.63 MG/3ML IN NEBU
0.6300 mg | INHALATION_SOLUTION | Freq: Three times a day (TID) | RESPIRATORY_TRACT | Status: DC
  Administered 2018-03-31 – 2018-04-02 (×7): 0.63 mg via RESPIRATORY_TRACT
  Filled 2018-03-31 (×7): qty 3

## 2018-03-31 MED ORDER — INSULIN ASPART PROT & ASPART (70-30 MIX) 100 UNIT/ML ~~LOC~~ SUSP
28.0000 [IU] | Freq: Two times a day (BID) | SUBCUTANEOUS | Status: DC
  Administered 2018-03-31 – 2018-04-02 (×5): 28 [IU] via SUBCUTANEOUS
  Filled 2018-03-31: qty 10

## 2018-03-31 MED ORDER — AMLODIPINE BESYLATE 10 MG PO TABS
10.0000 mg | ORAL_TABLET | Freq: Every day | ORAL | Status: DC
  Administered 2018-03-31 – 2018-04-02 (×3): 10 mg via ORAL
  Filled 2018-03-31 (×3): qty 1

## 2018-03-31 NOTE — Progress Notes (Signed)
Name: Holly Ortiz MRN: [id number] DOB: 03/24/1966    ADMISSION DATE:  03/28/2018 CONSULTATION DATE:  03/31/2018   REFERRING MD :  Joesph Fillers [facility] MD  CHIEF COMPLAINT:  Hypoxia   HISTORY OF PRESENT ILLNESS: 52 year old never smoker with uncontrolled diabetes admitted 4/25 for urticaria .  She has a history of recurrent urticaria and breaking out into hives since 2007, no clear etiology found.  She was noted to be hypoxic in the emergency room and placed on oxygen.  She reports URI symptoms starting 4 days ago with cough productive sputum but she did not notice the color.  She had low-grade temperature to 100.  She was noted to be tachycardic and tachypneic with borderline oxygen saturations and CT Angie of the chest was performed which did not show any pulmonary embolism, showed mild tree-in-bud opacities suggestive of bronchiolitis per radiology. She was noted to have high sugars up to 500 with elevated anion gap and was treated with insulin drip.  Anion gap is now resolved and sugars are down to 200 range. We are consulted for hypoxia unclear etiology.  She reports occasional wheezing and mild shortness of breath.  She has been persistently tachycardic.  She notes that having feet swelling have subsided.  She was given Benadryl, steroids were not given due to high sugars  She reports childhood history of asthma but this went away as a young adult.  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  She has lived in [place] until few months ago when she moved from   STUDIES:  Ct chest 4/25 >>   No PE, ?bronchiolitis EKG-sinus tachycardia RVP positive for bronchiolitis  SUBJECTIVE:  Persistent cough  VITAL SIGNS: Temp:  [98.6 F (37 C)-99.3 F (37.4 C)] 98.9 F (37.2 C) (04/28 0954) Pulse Rate:  [99-110] 104 (04/28 0954) Resp:  [20] 20 (04/28 0954) BP: (114-160)/(51-74) 130/64 (04/28 0954) SpO2:  [89  %-96 %] 92 % (04/28 1417) Weight:  [179 lb 10.8 oz (81.5 kg)] 179 lb 10.8 oz (81.5 kg) (04/27 2035)  PHYSICAL EXAMINATION: Gen. Resting comfortably in bed, NAD HEENT: [place]/AT, PERRL, EOM-I and MMM Neck: -JVD Lungs: Bibasilar crackles improved compared to yesterday Cardiovascular: RRR, Nl S1/S2 and -M/R/G Abdomen: Soft, NT, ND and +BS Musculoskeletal: No deformities, no cyanosis or clubbing Neuro:  Alert and interactive, moving all ext to command   Recent Labs  Lab 03/28/18 0904 03/29/18 0727 03/30/18 0621  NA 131* 137 137  K 4.4 3.7 2.9*  CL 96* 106 99*  CO2 19* 22 28  BUN 13 7 7   CREATININE 0.77 0.40* 0.40*  GLUCOSE 470* 265* 177*   Recent Labs  Lab 03/28/18 0904  HGB 13.9  HCT 43.5  WBC 6.9  PLT 263   No results found. I reviewed chest CT myself, no PE, bronchiolitis noted  ASSESSMENT / PLAN:  52 year old obese female presenting to PCCM with hypoxemia.  CT is negative for a PE.  Echo is negative for signs of failure.  Acute bronchiolitis signs on CT however.  Discussed with PCCM-NP and TR[facility]-MD.  Hypoxemia:  - Titrate O2 for sat >90%  - Will need an ambulatory desaturation study prior to discharge for home O2  - Discussed with primary, will evaluate an echo with a bubble study looking for a shunt  Bronchiolitis: viral with positive metapneumovirus  - PCT<0.1  - Allow more time for viral pneumonitis/bronchiolitis to  improve  - No anti-virals indicated at this time.  Cough and sputum clearance  - IS per RT protocol  - Flutter valve per RT protocol  PCCM will sign off, please call back if needed.  Rush Farmer, M.D. [facility] Pulmonary/Critical Care Medicine. Pager: [phone]. After hours pager: [phone].  03/31/2018, 3:23 PM

## 2018-03-31 NOTE — Progress Notes (Signed)
Name: Holly Ortiz MRN: [id number] DOB: December 02, 1966    ADMISSION DATE:  03/28/2018 CONSULTATION DATE:  03/31/2018   REFERRING MD :  Joesph Fillers [facility]  CHIEF COMPLAINT:  Hypoxia   HISTORY OF PRESENT ILLNESS: 52 year old never smoker with uncontrolled diabetes admitted 4/25 for urticaria .  She has a history of recurrent urticaria and breaking out into hives since 2007, no clear etiology found.  She was noted to be hypoxic in the emergency room and placed on oxygen.  She reports URI symptoms starting 4 days ago with cough productive sputum but she did not notice the color.  She had low-grade temperature to 100.  She was noted to be tachycardic and tachypneic with borderline oxygen saturations and CT Angie of the chest was performed which did not show any pulmonary embolism, showed mild tree-in-bud opacities suggestive of bronchiolitis per radiology. She was noted to have high sugars up to 500 with elevated anion gap and was treated with insulin drip.  Anion gap is now resolved and sugars are down to 200 range. We are consulted for hypoxia unclear etiology.  She reports occasional wheezing and mild shortness of breath.  She has been persistently tachycardic.  She notes that having feet swelling have subsided.  She was given Benadryl, steroids were not given due to high sugars  She reports childhood history of asthma but this went away as a young adult.  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  She has lived in [place] until few months ago when she moved from   STUDIES:  Ct chest 4/25 >>   No PE, ?bronchiolitis EKG-sinus tachycardia  SUBJECTIVE:  Feeling better, denies chest pain.  VITAL SIGNS: Temp:  [98.6 F (37 C)-99.3 F (37.4 C)] 98.9 F (37.2 C) (04/28 0954) Pulse Rate:  [99-110] 104 (04/28 0954) Resp:  [20] 20 (04/28 0954) BP: (114-160)/(51-74) 130/64 (04/28 0954) SpO2:  [89 %-96 %] 94 %  (04/28 0954) Weight:  [179 lb 10.8 oz (81.5 kg)] 179 lb 10.8 oz (81.5 kg) (04/27 2035)  Intake/Output Summary (Last 24 hours) at 03/31/2018 1211 Last data filed at 03/31/2018 0617 Gross per 24 hour  Intake 483 ml  Output 0 ml  Net 483 ml  Down to 4.5 L  PHYSICAL EXAMINATION: General: 52 year old white female currently resting in bed no acute distress HEENT: Normocephalic atraumatic no jugular venous distention mucous membranes moist Pulmonary: Faint expiratory wheeze with some scattered rhonchi no accessory use Cardiac: Regular rate and rhythm without murmur rub or gallop Abdomen: Soft nontender no organomegaly positive bowel sounds Extremities/musculoskeletal: Warm dry no edema brisk cap refill strong pulses. Neuro: Awake oriented no focal deficits.   Recent Labs  Lab 03/28/18 0904 03/29/18 0727 03/30/18 0621  NA 131* 137 137  K 4.4 3.7 2.9*  CL 96* 106 99*  CO2 19* 22 28  BUN 13 7 7   CREATININE 0.77 0.40* 0.40*  GLUCOSE 470* 265* 177*   Recent Labs  Lab 03/28/18 0904  HGB 13.9  HCT 43.5  WBC 6.9  PLT 263   No results found. chest CT, no PE, evidence of bronchiolitis  ASSESSMENT / PLAN:  52 year old obese female presenting to PCCM with hypoxemia.  CT is negative for a PE.  Echo is negative for signs of failure.  Acute bronchiolitis signs on CT however.  .  Acute Hypoxemia: Plan Continue supplemental oxygen and pulse oximetry Wean oxygen for  saturations greater than 90% Will need walking oximetry prior to discharge suspect she will need to go home on oxygen for some time  Bronchiolitis: allergic vs viral +metapneumovirus  PCT negative  Plan Continue supportive care await pathology  Cough and sputum clearance Plan Continue incentive spirometry and flutter valve  I will make her a follow-up in our clinic in the next 7 to 10 days for reassessment of oxygen needs and re-evaluation  Erick Colace ACNP-BC [place] Pager # [phone]  OR # [phone] if no answer  03/31/2018, 12:09 PM

## 2018-03-31 NOTE — Progress Notes (Signed)
Patient Demographics:    Holly Ortiz, is a 52 y.o. female, DOB - 12/30/1965, [id number]  Admit date - 03/28/2018   Admitting Physician Laurence Folz Denton Brick, MD  Outpatient Primary MD for the patient is Mack Hook, MD  LOS - 2   Chief Complaint  Patient presents with  . Shortness of Breath  . Allergic Reaction        Subjective:    [name] today has no fevers, no emesis,  No chest pain, less short of breath, less tachypneic  Assessment  & Plan :    Principal Problem:   DKA (diabetic ketoacidoses) (HCC) Active Problems:   Hyperglycemia   Tachycardia   Urticaria   1)Acute Hypoxic Respiratory Failure- etiology unclear, ???  We will get echo with bubble study to rule out shunt,  CTA chest reviewed with radiologist, no evidence of pneumonia, no effusions,  no PE, patient has mild bronchiolitis, echocardiogram from 03/29/18 with preserved EF of 55 to 60% without significant regional wall abnormalities, or valvular abnormalities, ABG from 03/29/2018 on 03/30/2017 shows hypoxic respiratory failure, continue supplemental oxygen.  Patient is a non-smoker . pulmonary critical care consult appreciated, be judicious with albuterol due to significant tachycardia, reluctant to use steroids due to resolving DKA.  respiratory viral panel with metapneumovirus,  procalcitonin is not elevated, give Xopenex for possible bronchial spasms/bronchiolitis.  Hold off on steroids for now, discussed with PCCM service  2)Mild DKA-on admission blood sugar was 470, bicarb is 19, anion gap is 16, patient admits to noncompliance with diet and lifestyle, hemoglobin A1c in February 2019 was 12.1,  overall acidosis/DKA has resolved, hyperglycemia persists, increase 70/30 insulin to 28 units twice daily, continue high-dose sliding scale coverage,   2)Persistent Tachycardia and Tachypnea-improving, but remains tachycardic,  DC duo nebs, use Xopenex rather . CTA chest without PE, TSH within normal limits,  echocardiogram with preserved EF of 55 to 60% without significant regional wall abnormalities, or valvular abnormalities, suspect tachycardia and tachypnea are secondary to hypoxia, hypoxia may be secondary to bronchospasms/bronchiolitis.  Please see #1 above  3)Recurrent Urticaria --resolved with antihistamines, continue H2 blockers and singular , avoid steroids at this time given elevated blood sugars, no concerns about angioedema type symptoms at this time, patient has had extensive work-up in the past for Urticaria (since 2007) including allergy and immunology evaluation as well as dermatology evaluation with no definite etiology identified  4)HTN- c/n amlodipine,   may use IV Hydralazine 10 mg  Every 4 hours Prn for systolic blood pressure over 160 mmhg  Code Status : Full code  Disposition Plan  : home with home oxygen  Consults  :  PCCM-signed off 03/31/2018  DVT Prophylaxis  :    Heparin   Lab Results  Component Value Date   PLT 263 03/28/2018   Inpatient Medications  Scheduled Meds: . amLODipine  10 mg Oral Daily  . aspirin EC  81 mg Oral Daily  . atorvastatin  20 mg Oral Daily  . escitalopram  20 mg Oral Daily  . famotidine  20 mg Oral BID  . guaiFENesin  1,200 mg Oral BID  . heparin  5,000 Units Subcutaneous Q8H  . insulin aspart  0-20 Units Subcutaneous TID WC  . insulin aspart  0-5 Units Subcutaneous QHS  . insulin aspart  6 Units Subcutaneous TID WC  . insulin aspart protamine- aspart  28 Units Subcutaneous BID WC  . levalbuterol  0.63 mg Nebulization TID  . loratadine  10 mg Oral Daily  . mouth rinse  15 mL Mouth Rinse BID  . montelukast  10 mg Oral QHS  . sodium chloride flush  3 mL Intravenous Q12H   Continuous Infusions: . sodium chloride     PRN Meds:.sodium chloride, acetaminophen **OR** acetaminophen, diphenhydrAMINE, guaiFENesin-dextromethorphan, ondansetron **OR**  ondansetron (ZOFRAN) IV, polyethylene glycol, sodium chloride flush, traZODone    Anti-infectives (From admission, onward)   None        Objective:   Vitals:   03/31/18 0500 03/31/18 0837 03/31/18 0954 03/31/18 1417  BP: (!) 143/66  130/64   Pulse: 99  (!) 104   Resp:   20   Temp: 99.1 F (37.3 C)  98.9 F (37.2 C)   TempSrc: Oral  Oral   SpO2: 96% 94% 94% 92%  Weight:        Wt Readings from Last 3 Encounters:  03/30/18 81.5 kg (179 lb 10.8 oz)  01/23/18 81.6 kg (180 lb)  01/05/18 86 kg (189 lb 8 oz)     Intake/Output Summary (Last 24 hours) at 03/31/2018 1721 Last data filed at 03/31/2018 1358 Gross per 24 hour  Intake 723 ml  Output 0 ml  Net 723 ml   Physical Exam  Gen:- Awake Alert,  In no apparent distress, speaking in sentences HEENT:- [place].AT, No sclera icterus Nose- [place] 2L/min Neck-Supple Neck,No JVD,.  Lungs-much improved wheezing today, good air movement CV- S1, S2 normal, tachycardic (HR 100s) Abd-  +ve B.Sounds, Abd Soft, No tenderness,    Extremity/Skin:- urticaria rash has resolved, no  edema,   negative Homans  psych-affect is appropriate, oriented x3 Neuro-no new focal deficits, no tremors   Data Review:   Micro Results Recent Results (from the past 240 hour(s))  Respiratory Panel by PCR     Status: Abnormal   Collection Time: 03/30/18  3:32 PM  Result Value Ref Range Status   Adenovirus NOT DETECTED NOT DETECTED Final   Coronavirus 229E NOT DETECTED NOT DETECTED Final   Coronavirus HKU1 NOT DETECTED NOT DETECTED Final   Coronavirus NL63 NOT DETECTED NOT DETECTED Final   Coronavirus OC43 NOT DETECTED NOT DETECTED Final   Metapneumovirus DETECTED (A) NOT DETECTED Final   Rhinovirus / Enterovirus NOT DETECTED NOT DETECTED Final   Influenza A NOT DETECTED NOT DETECTED Final   Influenza B NOT DETECTED NOT DETECTED Final   Parainfluenza Virus 1 NOT DETECTED NOT DETECTED Final   Parainfluenza Virus 2 NOT DETECTED NOT DETECTED Final    Parainfluenza Virus 3 NOT DETECTED NOT DETECTED Final   Parainfluenza Virus 4 NOT DETECTED NOT DETECTED Final   Respiratory Syncytial Virus NOT DETECTED NOT DETECTED Final   Bordetella pertussis NOT DETECTED NOT DETECTED Final   Chlamydophila pneumoniae NOT DETECTED NOT DETECTED Final   Mycoplasma pneumoniae NOT DETECTED NOT DETECTED Final    Radiology Reports Dg Chest 2 View  Result Date: 03/28/2018 CLINICAL DATA:  History of hives. EXAM: CHEST - 2 VIEW COMPARISON:  01/03/2018. FINDINGS: Mediastinum and hilar structures normal. Heart size normal. Lungs are clear. No pleural effusion or pneumothorax. No acute bony abnormality. IMPRESSION: No acute cardiopulmonary disease. Electronically Signed   By: Onida   On: 03/28/2018 09:47   Ct Angio Chest Pe W And/or Wo Contrast  Result  Date: 03/28/2018 CLINICAL DATA:  Chronic cough. History of thighs for several years. Patient started breaking out last evening and became dyspneic. EXAM: CT ANGIOGRAPHY CHEST WITH CONTRAST TECHNIQUE: Multidetector CT imaging of the chest was performed using the standard protocol during bolus administration of intravenous contrast. Multiplanar CT image reconstructions and MIPs were obtained to evaluate the vascular anatomy. CONTRAST:  119mL ISOVUE-370 IOPAMIDOL (ISOVUE-370) INJECTION 76% COMPARISON:  04/01/2007 FINDINGS: Cardiovascular: The study is of quality for the evaluation of pulmonary embolism. There are no filling defects in the central, lobar, segmental or subsegmental pulmonary artery branches to suggest acute pulmonary embolism. Great vessels are normal in course and caliber. Normal heart size. No significant pericardial fluid/thickening. Minimal aortic atherosclerosis. Mediastinum/Nodes: No discrete thyroid nodules. Unremarkable esophagus. No pathologically enlarged axillary, mediastinal or hilar lymph nodes. Mild gas filled distention of the esophagus without significant mural thickening. Small hiatal  hernia. Lungs/Pleura: Respiratory motion artifacts site limit assessment. No pulmonary consolidation. No pneumothorax. No pleural effusion. Suggestion of mild tree-in-bud opacities bilaterally which can be seen in bronchiolitis. Upper abdomen: Hepatic steatosis.  No mass or biliary dilatation. Musculoskeletal:  No aggressive appearing focal osseous lesions. Review of the MIP images confirms the above findings. IMPRESSION: 1. No acute pulmonary embolus. 2. Mild diffuse tree-in-bud opacities noted bilaterally compatible with bronchiolitis. 3. Hepatic steatosis. Aortic Atherosclerosis (ICD10-I70.0). Electronically Signed   By: Ashley Royalty M.D.   On: 03/28/2018 18:12     CBC Recent Labs  Lab 03/28/18 0904  WBC 6.9  HGB 13.9  HCT 43.5  PLT 263  MCV 78.0  MCH 24.9*  MCHC 32.0  RDW 15.6*   FIO2  21.00    pH, Arterial 7.350 - 7.450 7.411    pCO2 arterial 32.0 - 48.0 mmHg 42.3    pO2, Arterial 83.0 - 108.0 mmHg 42.7Low     Bicarbonate 20.0 - 28.0 mmol/L 26.3  23.5   Acid-Base Excess 0.0 - 2.0 mmol/L 2.1High     O2 Saturation % 79.3  77.0   Patient temperature  98.6  HIDE   Collection site  RIGHT BRACHIAL      Chemistries  Recent Labs  Lab 03/28/18 0904 03/29/18 0727 03/30/18 0621  NA 131* 137 137  K 4.4 3.7 2.9*  CL 96* 106 99*  CO2 19* 22 28  GLUCOSE 470* 265* 177*  BUN 13 7 7   CREATININE 0.77 0.40* 0.40*  CALCIUM 8.9 7.8* 8.0*   ------------------------------------------------------------------------------------------------------------------ No results for input(s): CHOL, HDL, LDLCALC, TRIG, CHOLHDL, LDLDIRECT in the last 72 hours.  Lab Results  Component Value Date   HGBA1C 12.1 (H) 01/04/2018   ------------------------------------------------------------------------------------------------------------------ Recent Labs    03/29/18 0727  TSH 1.297   ------------------------------------------------------------------------------------------------------------------ No  results for input(s): VITAMINB12, FOLATE, FERRITIN, TIBC, IRON, RETICCTPCT in the last 72 hours.  Coagulation profile No results for input(s): INR, PROTIME in the last 168 hours.  No results for input(s): DDIMER in the last 72 hours.  Cardiac Enzymes Recent Labs  Lab 03/29/18 1702 03/30/18 0016 03/30/18 0621  TROPONINI <0.03 <0.03 <0.03   ------------------------------------------------------------------------------------------------------------------    Component Value Date/Time   BNP 39.2 03/30/2018 1425   Dorathy Stallone M.D on 03/31/2018 at 5:21 PM  Between 7am to 7pm - Pager - [phone]  After 7pm go to [email or web address] - password [email or web address]  [facility] -  Office  [phone]   Voice Recognition Viviann Spare dictation system was used to create this note, attempts have been made to correct errors. Please contact the author with questions and/or clarifications.

## 2018-04-01 ENCOUNTER — Ambulatory Visit: Payer: Medicaid Other | Admitting: Internal Medicine

## 2018-04-01 LAB — BASIC METABOLIC PANEL
ANION GAP: 5 (ref 5–15)
BUN: 6 mg/dL (ref 6–20)
CALCIUM: 8.2 mg/dL — AB (ref 8.9–10.3)
CHLORIDE: 102 mmol/L (ref 101–111)
CO2: 32 mmol/L (ref 22–32)
Creatinine, Ser: 0.42 mg/dL — ABNORMAL LOW (ref 0.44–1.00)
GFR calc non Af Amer: >60 mL/min (ref >60)
GLUCOSE: 162 mg/dL — AB (ref 65–99)
POTASSIUM: 3.6 mmol/L (ref 3.5–5.1)
Sodium: 139 mmol/L (ref 135–145)

## 2018-04-01 LAB — GLUCOSE, CAPILLARY
GLUCOSE-CAPILLARY: 144 mg/dL — AB (ref 65–99)
GLUCOSE-CAPILLARY: 231 mg/dL — AB (ref 65–99)
GLUCOSE-CAPILLARY: 175 mg/dL — AB (ref 65–99)
Glucose-Capillary: 128 mg/dL — ABNORMAL HIGH (ref 65–99)

## 2018-04-01 NOTE — Progress Notes (Signed)
Patient Demographics:    Holly Ortiz, is a 52 y.o. female, DOB - 30-Aug-1966, [id number]  Admit date - 03/28/2018   Admitting Physician Ngina Royer Denton Brick, MD  Outpatient Primary MD for the patient is Mack Hook, MD  LOS - 3   Chief Complaint  Patient presents with  . Shortness of Breath  . Allergic Reaction        Subjective:    Holly Ortiz today has no fevers, no emesis,  No chest pain, patient states she feels better with oxygen,  awaiting echocardiogram with bubble study to rule out shunt prior to discharge home with home O2   Assessment  & Plan :    Principal Problem:   DKA (diabetic ketoacidoses) ([place]) Active Problems:   Hyperglycemia   Tachycardia   Urticaria   1)Acute Hypoxic Respiratory Failure- etiology unclear, -medically improving with oxygen supplementation,  awaiting echocardiogram with bubble study to rule out shunt prior to discharge home with home O2,  CTA chest reviewed with radiologist, no evidence of pneumonia, no effusions,  no PE, patient has mild bronchiolitis, echocardiogram from 03/29/18 with preserved EF of 55 to 60% without significant regional wall abnormalities, or valvular abnormalities, ABG from 03/29/2018 on 03/30/2017 shows hypoxic respiratory failure, continue supplemental oxygen.  Patient is a non-smoker . pulmonary critical care consult appreciated, be judicious with albuterol due to significant tachycardia, reluctant to use steroids due to resolving DKA.  respiratory viral panel with metapneumovirus,  procalcitonin is not elevated, give Xopenex for possible bronchial spasms/bronchiolitis.  Hold off on steroids for now, discussed with PCCM service. NB!!! O2 sats at rest 85% on room air, with ambulation O2 sats down to 81% on RA, on 2 L of oxygen with O2 sats back to 92%  Patient needs continuous oxygen via nasal cannula at 2 L/min with conserving  device and gaseous portability  Hypoxia , patient needs home Oxygen  2)Mild DKA-on admission blood sugar was 470, bicarb is 19, anion gap is 16, patient admits to noncompliance with diet and lifestyle, hemoglobin A1c in February 2019 was 12.1,  overall acidosis/DKA has resolved, hyperglycemia persists, c/n  70/30 insulin to 28 units twice daily, continue high-dose sliding scale coverage,   3)Persistent Tachycardia and Tachypnea-improving, but remains tachycardic, DC duo nebs, use Xopenex rather . CTA chest without PE, TSH within normal limits,  echocardiogram with preserved EF of 55 to 60% without significant regional wall abnormalities, or valvular abnormalities, suspect tachycardia and tachypnea are secondary to hypoxia, hypoxia may be secondary to bronchospasms/bronchiolitis.  Please see #1 above  4)Recurrent Urticaria --resolved with antihistamines, continue H2 blockers and singular , avoid steroids at this time given elevated blood sugars, no concerns about angioedema type symptoms at this time, patient has had extensive work-up in the past for Urticaria (since 2007) including allergy and immunology evaluation as well as dermatology evaluation with no definite etiology identified  5)HTN-improved BP control c/n amlodipine,   may use IV Hydralazine 10 mg  Every 4 hours Prn for systolic blood pressure over 160 mmhg  NB!!! O2 sats at rest 85% on room air, with ambulation O2 sats down to 81% on RA, on 2 L of oxygen with O2 sats back to 92%  Patient needs continuous oxygen via nasal cannula at  2 L/min with conserving device and gaseous portability  Hypoxia , patient needs home Oxygen  Code Status : Full code  Disposition Plan  :  awaiting echocardiogram with bubble study to rule out shunt prior to discharge home with home O2  Consults  :  PCCM-signed off 03/31/2018  DVT Prophylaxis  :    Heparin   Lab Results  Component Value Date   PLT 263 03/28/2018   Inpatient  Medications  Scheduled Meds: . amLODipine  10 mg Oral Daily  . aspirin EC  81 mg Oral Daily  . atorvastatin  20 mg Oral Daily  . escitalopram  20 mg Oral Daily  . famotidine  20 mg Oral BID  . guaiFENesin  1,200 mg Oral BID  . heparin  5,000 Units Subcutaneous Q8H  . insulin aspart  0-20 Units Subcutaneous TID WC  . insulin aspart  0-5 Units Subcutaneous QHS  . insulin aspart  6 Units Subcutaneous TID WC  . insulin aspart protamine- aspart  28 Units Subcutaneous BID WC  . levalbuterol  0.63 mg Nebulization TID  . loratadine  10 mg Oral Daily  . mouth rinse  15 mL Mouth Rinse BID  . montelukast  10 mg Oral QHS  . sodium chloride flush  3 mL Intravenous Q12H   Continuous Infusions: . sodium chloride     PRN Meds:.sodium chloride, acetaminophen **OR** acetaminophen, diphenhydrAMINE, guaiFENesin-dextromethorphan, ondansetron **OR** ondansetron (ZOFRAN) IV, polyethylene glycol, sodium chloride flush, traZODone    Anti-infectives (From admission, onward)   None        Objective:   Vitals:   04/01/18 0752 04/01/18 0844 04/01/18 1421 04/01/18 1606  BP: (!) 151/76   (!) 125/57  Pulse: 90   97  Resp: 18   18  Temp: 98.3 F (36.8 C)   98.9 F (37.2 C)  TempSrc: Oral   Oral  SpO2: 93% 96% 94% (!) 87%  Weight:        Wt Readings from Last 3 Encounters:  03/31/18 81.2 kg (179 lb 0.2 oz)  01/23/18 81.6 kg (180 lb)  01/05/18 86 kg (189 lb 8 oz)     Intake/Output Summary (Last 24 hours) at 04/01/2018 1700 Last data filed at 04/01/2018 1419 Gross per 24 hour  Intake 720 ml  Output 200 ml  Net 520 ml   Physical Exam  Gen:- Awake Alert,  In no apparent distress, speaking in sentences HEENT:- [place].AT, No sclera icterus Nose- [place] 2L/min Neck-Supple Neck,No JVD,.  Lungs-much improved wheezing today, good air movement CV- S1, S2 normal, tachycardic (HR 100s) Abd-  +ve B.Sounds, Abd Soft, No tenderness,    Extremity/Skin:- urticaria rash has resolved, no  edema,   negative  Homans  psych-affect is appropriate, oriented x3 Neuro-no new focal deficits, no tremors   Data Review:   Micro Results Recent Results (from the past 240 hour(s))  Respiratory Panel by PCR     Status: Abnormal   Collection Time: 03/30/18  3:32 PM  Result Value Ref Range Status   Adenovirus NOT DETECTED NOT DETECTED Final   Coronavirus 229E NOT DETECTED NOT DETECTED Final   Coronavirus HKU1 NOT DETECTED NOT DETECTED Final   Coronavirus NL63 NOT DETECTED NOT DETECTED Final   Coronavirus OC43 NOT DETECTED NOT DETECTED Final   Metapneumovirus DETECTED (A) NOT DETECTED Final   Rhinovirus / Enterovirus NOT DETECTED NOT DETECTED Final   Influenza A NOT DETECTED NOT DETECTED Final   Influenza B NOT DETECTED NOT DETECTED Final   Parainfluenza  Virus 1 NOT DETECTED NOT DETECTED Final   Parainfluenza Virus 2 NOT DETECTED NOT DETECTED Final   Parainfluenza Virus 3 NOT DETECTED NOT DETECTED Final   Parainfluenza Virus 4 NOT DETECTED NOT DETECTED Final   Respiratory Syncytial Virus NOT DETECTED NOT DETECTED Final   Bordetella pertussis NOT DETECTED NOT DETECTED Final   Chlamydophila pneumoniae NOT DETECTED NOT DETECTED Final   Mycoplasma pneumoniae NOT DETECTED NOT DETECTED Final    Radiology Reports Dg Chest 2 View  Result Date: 03/28/2018 CLINICAL DATA:  History of hives. EXAM: CHEST - 2 VIEW COMPARISON:  01/03/2018. FINDINGS: Mediastinum and hilar structures normal. Heart size normal. Lungs are clear. No pleural effusion or pneumothorax. No acute bony abnormality. IMPRESSION: No acute cardiopulmonary disease. Electronically Signed   By: Marcello Moores  Register   On: 03/28/2018 09:47   Ct Angio Chest Pe W And/or Wo Contrast  Result Date: 03/28/2018 CLINICAL DATA:  Chronic cough. History of thighs for several years. Patient started breaking out last evening and became dyspneic. EXAM: CT ANGIOGRAPHY CHEST WITH CONTRAST TECHNIQUE: Multidetector CT imaging of the chest was performed using the standard  protocol during bolus administration of intravenous contrast. Multiplanar CT image reconstructions and MIPs were obtained to evaluate the vascular anatomy. CONTRAST:  153mL ISOVUE-370 IOPAMIDOL (ISOVUE-370) INJECTION 76% COMPARISON:  04/01/2007 FINDINGS: Cardiovascular: The study is of quality for the evaluation of pulmonary embolism. There are no filling defects in the central, lobar, segmental or subsegmental pulmonary artery branches to suggest acute pulmonary embolism. Great vessels are normal in course and caliber. Normal heart size. No significant pericardial fluid/thickening. Minimal aortic atherosclerosis. Mediastinum/Nodes: No discrete thyroid nodules. Unremarkable esophagus. No pathologically enlarged axillary, mediastinal or hilar lymph nodes. Mild gas filled distention of the esophagus without significant mural thickening. Small hiatal hernia. Lungs/Pleura: Respiratory motion artifacts site limit assessment. No pulmonary consolidation. No pneumothorax. No pleural effusion. Suggestion of mild tree-in-bud opacities bilaterally which can be seen in bronchiolitis. Upper abdomen: Hepatic steatosis.  No mass or biliary dilatation. Musculoskeletal:  No aggressive appearing focal osseous lesions. Review of the MIP images confirms the above findings. IMPRESSION: 1. No acute pulmonary embolus. 2. Mild diffuse tree-in-bud opacities noted bilaterally compatible with bronchiolitis. 3. Hepatic steatosis. Aortic Atherosclerosis (ICD10-I70.0). Electronically Signed   By: Ashley Royalty M.D.   On: 03/28/2018 18:12     CBC Recent Labs  Lab 03/28/18 0904  WBC 6.9  HGB 13.9  HCT 43.5  PLT 263  MCV 78.0  MCH 24.9*  MCHC 32.0  RDW 15.6*   FIO2  21.00    pH, Arterial 7.350 - 7.450 7.411    pCO2 arterial 32.0 - 48.0 mmHg 42.3    pO2, Arterial 83.0 - 108.0 mmHg 42.7Low     Bicarbonate 20.0 - 28.0 mmol/L 26.3  23.5   Acid-Base Excess 0.0 - 2.0 mmol/L 2.1High     O2 Saturation % 79.3  77.0   Patient temperature   98.6  HIDE   Collection site  RIGHT BRACHIAL      Chemistries  Recent Labs  Lab 03/28/18 0904 03/29/18 0727 03/30/18 0621 04/01/18 0702  NA 131* 137 137 139  K 4.4 3.7 2.9* 3.6  CL 96* 106 99* 102  CO2 19* 22 28 32  GLUCOSE 470* 265* 177* 162*  BUN 13 7 7 6   CREATININE 0.77 0.40* 0.40* 0.42*  CALCIUM 8.9 7.8* 8.0* 8.2*   ------------------------------------------------------------------------------------------------------------------ No results for input(s): CHOL, HDL, LDLCALC, TRIG, CHOLHDL, LDLDIRECT in the last 72 hours.  Lab Results  Component Value Date   HGBA1C 12.1 (H) 01/04/2018   ------------------------------------------------------------------------------------------------------------------ No results for input(s): TSH, T4TOTAL, T3FREE, THYROIDAB in the last 72 hours.  Invalid input(s): FREET3 ------------------------------------------------------------------------------------------------------------------ No results for input(s): VITAMINB12, FOLATE, FERRITIN, TIBC, IRON, RETICCTPCT in the last 72 hours.  Coagulation profile No results for input(s): INR, PROTIME in the last 168 hours.  No results for input(s): DDIMER in the last 72 hours.  Cardiac Enzymes Recent Labs  Lab 03/29/18 1702 03/30/18 0016 03/30/18 0621  TROPONINI <0.03 <0.03 <0.03   ------------------------------------------------------------------------------------------------------------------    Component Value Date/Time   BNP 39.2 03/30/2018 1425   Holly Ortiz M.D on 04/01/2018 at 5:00 PM  awaiting echocardiogram with bubble study to rule out shunt prior to discharge home with home O2  Between 7am to 7pm - Pager - [phone]  After 7pm go to [email or web address] - password [email or web address]  [facility] -  Office  [phone]   Voice Recognition Viviann Spare dictation system was used to create this note, attempts have been made to correct errors. Please contact the author with questions  and/or clarifications.

## 2018-04-02 ENCOUNTER — Inpatient Hospital Stay (HOSPITAL_COMMUNITY): Payer: Self-pay

## 2018-04-02 ENCOUNTER — Other Ambulatory Visit (HOSPITAL_COMMUNITY): Payer: Medicaid Other

## 2018-04-02 DIAGNOSIS — J9600 Acute respiratory failure, unspecified whether with hypoxia or hypercapnia: Secondary | ICD-10-CM

## 2018-04-02 LAB — GLUCOSE, CAPILLARY
GLUCOSE-CAPILLARY: 167 mg/dL — AB (ref 65–99)
Glucose-Capillary: 227 mg/dL — ABNORMAL HIGH (ref 65–99)
Glucose-Capillary: 242 mg/dL — ABNORMAL HIGH (ref 65–99)

## 2018-04-02 MED ORDER — AMLODIPINE BESYLATE 10 MG PO TABS: 10.0000 mg | ORAL_TABLET | Freq: Every day | ORAL | 4 refills | Status: DC

## 2018-04-02 MED ORDER — INSULIN ASPART PROT & ASPART (70-30 MIX) 100 UNIT/ML ~~LOC~~ SUSP: 30.0000 [IU] | Freq: Two times a day (BID) | SUBCUTANEOUS | 11 refills | Status: DC

## 2018-04-02 MED ORDER — MONTELUKAST SODIUM 10 MG PO TABS: 10.0000 mg | ORAL_TABLET | Freq: Every day | ORAL | 2 refills | Status: DC

## 2018-04-02 MED ORDER — ONDANSETRON 4 MG PO TBDP: 4.0000 mg | ORAL_TABLET | Freq: Three times a day (TID) | ORAL | 0 refills | Status: DC | PRN

## 2018-04-02 MED ORDER — ESCITALOPRAM OXALATE 20 MG PO TABS: 20.0000 mg | ORAL_TABLET | Freq: Every day | ORAL | 3 refills | Status: DC

## 2018-04-02 MED ORDER — METOPROLOL TARTRATE 25 MG PO TABS: 25.0000 mg | ORAL_TABLET | Freq: Two times a day (BID) | ORAL | 2 refills | Status: DC

## 2018-04-02 MED ORDER — ALBUTEROL SULFATE (2.5 MG/3ML) 0.083% IN NEBU: 2.5000 mg | INHALATION_SOLUTION | Freq: Four times a day (QID) | RESPIRATORY_TRACT | 1 refills | Status: DC | PRN

## 2018-04-02 MED ORDER — GUAIFENESIN ER 600 MG PO TB12: 1200.0000 mg | ORAL_TABLET | Freq: Two times a day (BID) | ORAL | 1 refills | Status: DC

## 2018-04-02 MED ORDER — INSULIN ASPART 100 UNIT/ML ~~LOC~~ SOLN: 0.0000 [IU] | Freq: Three times a day (TID) | SUBCUTANEOUS | 11 refills | Status: DC

## 2018-04-02 MED ORDER — ATORVASTATIN CALCIUM 20 MG PO TABS: 20.0000 mg | ORAL_TABLET | Freq: Every day | ORAL | 3 refills | Status: DC

## 2018-04-02 NOTE — Care Management Note (Signed)
Case Management Note  Patient Details  Name: Holly Ortiz MRN: [id number] Date of Birth: [birth year]  Subjective/Objective:   Acute resp failure, metapneumovirus                Action/Plan: NCM spoke to pt. States she started working and her insurance will start in June. Contacted [facility] for oxygen and neb machine. Provided pt with [facility] letter with a once per year use and $3 copay. Requested NCM fax dc summary to [employer]. She will call to arrange appt. Will need note for work. Notified attending.   Expected Discharge Date:  04/02/18               Expected Discharge Plan:  Home/Self Care  In-House Referral:  NA  Discharge planning Services  CM Consult, Medication Assistance, [place] [facility]  Post Acute Care Choice:  NA Choice offered to:  NA  DME Arranged:  N/A, Oxygen, Nebulizer machine DME Agency:  [place]:  NA [place] Agency:  NA  Status of Service:  Completed, signed off  If discussed at [place] of Stay Meetings, dates discussed:    Additional Comments:  Erenest Rasher, RN 04/02/2018, 3:02 PM

## 2018-04-02 NOTE — Progress Notes (Signed)
  Echocardiogram 2D Echocardiogram has been performed.  Holly Ortiz F 04/02/2018, 1:01 PM

## 2018-04-02 NOTE — Discharge Summary (Signed)
Holly Ortiz, is a 52 y.o. female  DOB 1966/01/05  MRN [id number].  Admission date:  03/28/2018  Admitting Physician  Roxan Hockey, MD  Discharge Date:  04/02/2018   Primary MD  Mack Hook, MD  Recommendations for primary care physician for things to follow:  2)Follow - up with pulmonologist as outpatient within 1 to 2 weeks  3) use oxygen at 2 L via nasal cannula continuously as advised  4) take all your other medications as previously advised   Admission Diagnosis  Metabolic acidosis [W09.8] Tachycardia [R00.0] Urticaria [L50.9] Hyperglycemia [R73.9]   Discharge Diagnosis  Metabolic acidosis [J19.1] Tachycardia [R00.0] Urticaria [L50.9] Hyperglycemia [R73.9]    Principal Problem:   DKA (diabetic ketoacidoses) ([place]) Active Problems:   Hyperglycemia   Tachycardia   Urticaria      Past Medical History:  Diagnosis Date  . Allergy   . 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-I)   . Asthma   . Diabetes mellitus    type II  . Hyperlipidemia   . Hypertension   . Urticaria, chronic   . UTI (lower urinary tract infection)     Past Surgical History:  Procedure Laterality Date  . CATARACT EXTRACTION, BILATERAL Bilateral 2014  . SOFT TISSUE MASS EXCISION Right 2016   [place] area  . TUBAL LIGATION  1995       HPI  from the history and physical done on the day of admission:    Holly Ortiz  is a 52 y.o. female with past medical history relevant for recurrent episodes of Urticaria that was previously investigated by dermatology and allergy and immunology without definite etiology, as well as history of poorly controlled diabetes who presents to the ED with complaints of persistent due itchy rash and URI symptoms, Pt states she has a history of breaking out into hives. She had an episode yesterday that was worse than usual. Pt also started ache and  hurt all over, even her hair hurts. She then started coughing yesterday as well. She felt feverish. She is nauseated and vomited a little bit. No diarrhea. No dysuria.   In the ED patient is found to be tachycardic, tachypneic with borderline oxygen saturation, despite aggressive IV fluids patient remains tachycardic,  CTA chest without PE, TSH and echocardiogram are pending due to persistent tachycardia, no chest pains no pleuritic symptoms no leg pain or leg swelling  Patient work-up in the ED also suggested uncontrolled diabetes with blood sugars close to 500 borderline low bicarb and elevated anion gap    Hospital Course:    1)Acute Hypoxic Respiratory Failure- etiology unclear, -medically  Much improved,with oxygen supplementation,  echocardiogram with bubble study without  Evidence of significant right-to-left shunt .  CTA chest reviewed with radiologist, no evidence of pneumonia, no effusions,  no PE, patient has mild bronchiolitis, echocardiogram from 03/29/18 with preserved EF of 55 to 60% without significant regional wall abnormalities, or valvular abnormalities, ABG from 03/29/2018 on 03/30/2017 shows hypoxic respiratory failure, continue supplemental oxygen.  Patient is  a non-smoker . pulmonary critical care consult appreciated, be judicious with albuterol due to significant tachycardia, reluctant to use steroids due to resolving DKA.  respiratory viral panel with metapneumovirus,  procalcitonin is not elevated,  Patient does have some bronchial spasms/bronchiolitis.    Hypoxia is improving,  Discharge home on home O2 follow up with pulmonologist as outpatient,  If  Hypoxia issecondary to bronchiolitis the issue would resolve in the short term  2)Mild DKA-on admission blood sugar was 470, bicarb is 19, anion gap is 16, patient admits to noncompliance with diet and lifestyle, hemoglobin A1c in February 2019 was 12.1, overall acidosis/DKA has resolved, hyperglycemia has improved,   70/30 insulin again titrated up  3)PersistentTachycardia andTachypnea-  Overall much improved tachycardia, multiple tachypnea with oxygen therapy,  CTA chest without PE, TSH within normal limits,  echocardiogram bubble study without evidence of right-to-left shunt, and with preserved EF of 55 to 60% without significant regional wall abnormalities, or valvular abnormalities, suspect tachycardia and tachypnea are secondary to hypoxia, hypoxia may be secondary to bronchospasms/bronchiolitis.  Please see #1 above  4)Recurrent Urticaria --resolved with antihistamines, continue H2 blockers and singular , avoid steroids at this time given elevated blood sugars, no concerns about angioedema type symptoms at this time,patient has had extensive work-up in the past for Urticaria (since 2007) includingallergy and immunology evaluation as well as dermatology evaluation with no definite etiology identified  5)HTN-improved BP control c/n amlodipine,     Code Status : Full code  Disposition Plan  :   home with home O2, follow up with pulmonologist as outpatient  Consults  :  PCCM-signed off 03/31/2018  Discharge Condition: stable  Follow UP  [facility].   Why:  will deliver portable oxygen and nebulizer machine to your room and oxygen concentrator to your home.  Contact information: [place] [address] [phone]          Diet and Activity recommendation:  As advised  Discharge Instructions     Discharge Instructions    Call MD for:  difficulty breathing, headache or visual disturbances   Complete by:  As directed    Call MD for:  persistant dizziness or light-headedness   Complete by:  As directed    Call MD for:  persistant nausea and vomiting   Complete by:  As directed    Call MD for:  severe uncontrolled pain   Complete by:  As directed    Call MD for:  temperature >100.4   Complete by:  As  directed    DME Nebulizer machine   Complete by:  As directed    Patient needs a nebulizer to treat with the following condition:  Hypoxemia   DME Nebulizer/meds   Complete by:  As directed    Patient needs a nebulizer to treat with the following condition:  Hypoxemia   Diet - low sodium heart healthy   Complete by:  As directed    Diet Carb Modified   Complete by:  As directed    Discharge instructions   Complete by:  As directed    1)Humolog insulin Correction coverage: before meals  CBG < 70: implement hypoglycemia protocol  CBG 70 - 120: 0 units  CBG 121 - 150: 3 units  CBG 151 - 200: 4 units  CBG 201 - 250: 7 units  CBG 251 - 300: 11 units  CBG 301 - 350: 15 units  CBG 351 - 400: 20 units  2)Follow - up with pulmonologist as outpatient within 1 to 2 weeks  3) use oxygen at 2 L via nasal cannula continuously as advised  4) take all your other medications as previously advised   Increase activity slowly   Complete by:  As directed         Discharge Medications     Allergies as of 04/02/2018      Reactions   Actos [pioglitazone Hydrochloride] Other (See Comments)   Wt gain   Lisinopril    Angioedema, including likely GI involvement      Medication List    TAKE these medications   albuterol (2.5 MG/3ML) 0.083% nebulizer solution Commonly known as:  PROVENTIL Take 3 mLs (2.5 mg total) by nebulization every 6 (six) hours as needed for wheezing or shortness of breath.   amLODipine 10 MG tablet Commonly known as:  NORVASC Take 1 tablet (10 mg total) by mouth daily. What changed:    medication strength  how much to take   aspirin 81 MG tablet Take 81 mg by mouth daily.   atorvastatin 20 MG tablet Commonly known as:  LIPITOR Take 1 tablet (20 mg total) by mouth daily.   escitalopram 20 MG tablet Commonly known as:  LEXAPRO Take 1 tablet (20 mg total) by mouth daily.   guaiFENesin 600 MG 12 hr tablet Commonly known as:  MUCINEX Take 2 tablets (1,200  mg total) by mouth 2 (two) times daily.   insulin aspart 100 UNIT/ML injection Commonly known as:  novoLOG Inject 0-20 Units into the skin 3 (three) times daily with meals.   insulin aspart protamine- aspart (70-30) 100 UNIT/ML injection Commonly known as:  NOVOLOG MIX 70/30 Inject 0.3 mLs (30 Units total) into the skin 2 (two) times daily with a meal.   metoprolol tartrate 25 MG tablet Commonly known as:  LOPRESSOR Take 1 tablet (25 mg total) by mouth 2 (two) times daily.   montelukast 10 MG tablet Commonly known as:  SINGULAIR Take 1 tablet (10 mg total) by mouth at bedtime.   ondansetron 4 MG disintegrating tablet Commonly known as:  ZOFRAN ODT Take 1 tablet (4 mg total) by mouth every 8 (eight) hours as needed for nausea or vomiting.            Durable Medical Equipment  (From admission, onward)        Start     Ordered   04/02/18 1431  For home use only DME Nebulizer machine  Once    Question:  Patient needs a nebulizer to treat with the following condition  Answer:  Asthma   04/02/18 1431   04/02/18 1425  DME Oxygen  Once    Question Answer Comment  Mode or (Route) Nasal cannula   Liters per Minute 2   Frequency Continuous (stationary and portable oxygen unit needed)   Oxygen conserving device Yes   Oxygen delivery system Gas      04/02/18 1428   04/02/18 0000  DME Nebulizer machine    Question:  Patient needs a nebulizer to treat with the following condition  Answer:  Hypoxemia   04/02/18 1428   04/02/18 0000  DME Nebulizer/meds    Question:  Patient needs a nebulizer to treat with the following condition  Answer:  Hypoxemia   04/02/18 1428   04/01/18 1658  For home use only DME oxygen  Once    Comments:  O2 sats at rest 85% on room  air, with ambulation O2 sats down to 81% on RA, on 2 L of oxygen with O2 sats back to 92%  Patient needs continuous oxygen via nasal cannula at 2 L/min with conserving device and gaseous portability  Hypoxia , patient needs  home Oxygen  Question Answer Comment  Mode or (Route) Nasal cannula   Liters per Minute 2   Frequency Continuous (stationary and portable oxygen unit needed)   Oxygen conserving device Yes   Oxygen delivery system Gas      04/01/18 1659      Major procedures and Radiology Reports - PLEASE review detailed and final reports for all details, in brief -    Dg Chest 2 View  Result Date: 03/28/2018 CLINICAL DATA:  History of hives. EXAM: CHEST - 2 VIEW COMPARISON:  01/03/2018. FINDINGS: Mediastinum and hilar structures normal. Heart size normal. Lungs are clear. No pleural effusion or pneumothorax. No acute bony abnormality. IMPRESSION: No acute cardiopulmonary disease. Electronically Signed   By: Marcello Moores  Register   On: 03/28/2018 09:47   Ct Angio Chest Pe W And/or Wo Contrast  Result Date: 03/28/2018 CLINICAL DATA:  Chronic cough. History of thighs for several years. Patient started breaking out last evening and became dyspneic. EXAM: CT ANGIOGRAPHY CHEST WITH CONTRAST TECHNIQUE: Multidetector CT imaging of the chest was performed using the standard protocol during bolus administration of intravenous contrast. Multiplanar CT image reconstructions and MIPs were obtained to evaluate the vascular anatomy. CONTRAST:  15mL ISOVUE-370 IOPAMIDOL (ISOVUE-370) INJECTION 76% COMPARISON:  04/01/2007 FINDINGS: Cardiovascular: The study is of quality for the evaluation of pulmonary embolism. There are no filling defects in the central, lobar, segmental or subsegmental pulmonary artery branches to suggest acute pulmonary embolism. Great vessels are normal in course and caliber. Normal heart size. No significant pericardial fluid/thickening. Minimal aortic atherosclerosis. Mediastinum/Nodes: No discrete thyroid nodules. Unremarkable esophagus. No pathologically enlarged axillary, mediastinal or hilar lymph nodes. Mild gas filled distention of the esophagus without significant mural thickening. Small hiatal hernia.  Lungs/Pleura: Respiratory motion artifacts site limit assessment. No pulmonary consolidation. No pneumothorax. No pleural effusion. Suggestion of mild tree-in-bud opacities bilaterally which can be seen in bronchiolitis. Upper abdomen: Hepatic steatosis.  No mass or biliary dilatation. Musculoskeletal:  No aggressive appearing focal osseous lesions. Review of the MIP images confirms the above findings. IMPRESSION: 1. No acute pulmonary embolus. 2. Mild diffuse tree-in-bud opacities noted bilaterally compatible with bronchiolitis. 3. Hepatic steatosis. Aortic Atherosclerosis (ICD10-I70.0). Electronically Signed   By: Ashley Royalty M.D.   On: 03/28/2018 18:12    Micro Results     Recent Results (from the past 240 hour(s))  Respiratory Panel by PCR     Status: Abnormal   Collection Time: 03/30/18  3:32 PM  Result Value Ref Range Status   Adenovirus NOT DETECTED NOT DETECTED Final   Coronavirus 229E NOT DETECTED NOT DETECTED Final   Coronavirus HKU1 NOT DETECTED NOT DETECTED Final   Coronavirus NL63 NOT DETECTED NOT DETECTED Final   Coronavirus OC43 NOT DETECTED NOT DETECTED Final   Metapneumovirus DETECTED (A) NOT DETECTED Final   Rhinovirus / Enterovirus NOT DETECTED NOT DETECTED Final   Influenza A NOT DETECTED NOT DETECTED Final   Influenza B NOT DETECTED NOT DETECTED Final   Parainfluenza Virus 1 NOT DETECTED NOT DETECTED Final   Parainfluenza Virus 2 NOT DETECTED NOT DETECTED Final   Parainfluenza Virus 3 NOT DETECTED NOT DETECTED Final   Parainfluenza Virus 4 NOT DETECTED NOT DETECTED Final   Respiratory Syncytial Virus NOT  DETECTED NOT DETECTED Final   Bordetella pertussis NOT DETECTED NOT DETECTED Final   Chlamydophila pneumoniae NOT DETECTED NOT DETECTED Final   Mycoplasma pneumoniae NOT DETECTED NOT DETECTED Final       Today   Subjective    [name] today has no  New complaints, husband at bedside, patient again to go home          Patient has been seen and  examined prior to discharge   Objective   Blood pressure (!) 148/73, pulse 98, temperature 98.2 F (36.8 C), temperature source Oral, resp. rate 18, height 5\' 1"  (1.549 m), weight 86.1 kg (189 lb 14.4 oz), SpO2 93 %.   Intake/Output Summary (Last 24 hours) at 04/02/2018 2030 Last data filed at 04/02/2018 1404 Gross per 24 hour  Intake 720 ml  Output 1 ml  Net 719 ml    Exam Gen:- Awake Alert,  In no apparent distress, speaking in complete sentences HEENT:- [place].AT, No sclera icterus Nose- [place] 2L/min Neck-Supple Neck,No JVD,.  Lungs-  no significant wheezing today good air movement CV- S1, S2 normal, tachycardic (HR 100s) Abd-  +ve B.Sounds, Abd Soft, No tenderness,    Extremity/Skin:- urticaria rash has resolved, no  edema,   negative Homans  psych-affect is appropriate, oriented x3 Neuro-no new focal deficits, no tremors     Data Review   CBC w Diff:  Lab Results  Component Value Date   WBC 6.9 03/28/2018   HGB 13.9 03/28/2018   HGB 11.7 12/18/2017   HCT 43.5 03/28/2018   HCT 37.7 12/18/2017   PLT 263 03/28/2018   PLT 406 (H) 12/18/2017   LYMPHOPCT 30.9 07/17/2011   MONOPCT 6.9 07/17/2011   EOSPCT 1.2 07/17/2011   BASOPCT 0.2 07/17/2011    CMP:  Lab Results  Component Value Date   NA 139 04/01/2018   NA 137 01/23/2018   K 3.6 04/01/2018   CL 102 04/01/2018   CO2 32 04/01/2018   BUN 6 04/01/2018   BUN 13 01/23/2018   CREATININE 0.42 (L) 04/01/2018   PROT 7.0 01/03/2018   PROT 7.6 12/18/2017   ALBUMIN 3.4 (L) 01/03/2018   ALBUMIN 4.3 12/18/2017   BILITOT 0.8 01/03/2018   BILITOT 0.2 12/18/2017   ALKPHOS 68 01/03/2018   AST 24 01/03/2018   ALT 22 01/03/2018  .   Total Discharge time is about 33 minutes  Roxan Hockey M.D on 04/02/2018 at 8:30 PM  [facility]   Office  [phone]  Voice Recognition Viviann Spare dictation system was used to create this note, attempts have been made to correct errors. Please contact the author with questions  and/or clarifications.

## 2018-04-02 NOTE — Progress Notes (Signed)
Patient discharged to Home . After visit Summary reviewed. Patient capable of reverbalizing medications and follow up visits. No signs and symptoms of distress noted. Patient educated to return to the ED in the case of an emergency. Patient will be Discharge on 3litres of oxygen  Dillon Bjork RN

## 2018-04-02 NOTE — Progress Notes (Signed)
SATURATION QUALIFICATIONS: (This note is used to comply with regulatory documentation for home oxygen)  Patient Saturations on Room Air at Rest = 93%  Patient Saturations on Room Air while Ambulating =85%  Patient Saturations on 3 Liters of oxygen while after ambulation 95%  Please briefly explain why patient needs home oxygen:

## 2018-04-02 NOTE — Discharge Instructions (Signed)
1)Humolog insulin Correction coverage: before meals  CBG < 70: implement hypoglycemia protocol  CBG 70 - 120: 0 units  CBG 121 - 150: 3 units  CBG 151 - 200: 4 units  CBG 201 - 250: 7 units  CBG 251 - 300: 11 units  CBG 301 - 350: 15 units  CBG 351 - 400: 20 units  2)Follow - up with pulmonologist as outpatient within 1 to 2 weeks  3) use oxygen at 2 L via nasal cannula continuously as advised  4) take all your other medications as previously advised

## 2018-04-11 ENCOUNTER — Inpatient Hospital Stay: Payer: Medicaid Other | Admitting: Acute Care

## 2018-04-24 ENCOUNTER — Ambulatory Visit: Payer: Medicaid Other | Admitting: Internal Medicine

## 2018-07-03 ENCOUNTER — Other Ambulatory Visit (INDEPENDENT_AMBULATORY_CARE_PROVIDER_SITE_OTHER): Payer: 59

## 2018-07-03 ENCOUNTER — Encounter: Payer: Self-pay | Admitting: Nurse Practitioner

## 2018-07-03 ENCOUNTER — Ambulatory Visit: Payer: 59 | Admitting: Nurse Practitioner

## 2018-07-03 VITALS — BP 132/78 | HR 86 | Temp 98.5°F | Resp 16 | Ht 61.0 in | Wt 188.0 lb

## 2018-07-03 DIAGNOSIS — F32A Depression, unspecified: Secondary | ICD-10-CM | POA: Insufficient documentation

## 2018-07-03 DIAGNOSIS — E118 Type 2 diabetes mellitus with unspecified complications: Secondary | ICD-10-CM

## 2018-07-03 DIAGNOSIS — L659 Nonscarring hair loss, unspecified: Secondary | ICD-10-CM | POA: Diagnosis not present

## 2018-07-03 DIAGNOSIS — F419 Anxiety disorder, unspecified: Secondary | ICD-10-CM

## 2018-07-03 DIAGNOSIS — Z794 Long term (current) use of insulin: Secondary | ICD-10-CM

## 2018-07-03 DIAGNOSIS — I10 Essential (primary) hypertension: Secondary | ICD-10-CM

## 2018-07-03 DIAGNOSIS — D649 Anemia, unspecified: Secondary | ICD-10-CM

## 2018-07-03 DIAGNOSIS — E782 Mixed hyperlipidemia: Secondary | ICD-10-CM

## 2018-07-03 DIAGNOSIS — F329 Major depressive disorder, single episode, unspecified: Secondary | ICD-10-CM

## 2018-07-03 LAB — LIPID PANEL
CHOL/HDL RATIO: 6
Cholesterol: 219 mg/dL — ABNORMAL HIGH (ref 0–200)
HDL: 38.8 mg/dL — ABNORMAL LOW (ref >39.00)
NonHDL: 180.38
Triglycerides: 351 mg/dL — ABNORMAL HIGH (ref 0.0–149.0)
VLDL: 70.2 mg/dL — ABNORMAL HIGH (ref 0.0–40.0)

## 2018-07-03 LAB — CBC
HCT: 42.1 % (ref 36.0–46.0)
HEMOGLOBIN: 13.8 g/dL (ref 12.0–15.0)
MCHC: 32.8 g/dL (ref 30.0–36.0)
MCV: 77.5 fl — ABNORMAL LOW (ref 78.0–100.0)
PLATELETS: 336 10*3/uL (ref 150.0–400.0)
RBC: 5.43 Mil/uL — AB (ref 3.87–5.11)
RDW: 15 % (ref 11.5–15.5)
WBC: 6.1 10*3/uL (ref 4.0–10.5)

## 2018-07-03 LAB — COMPREHENSIVE METABOLIC PANEL
ALK PHOS: 73 U/L (ref 39–117)
ALT: 17 U/L (ref 0–35)
AST: 11 U/L (ref 0–37)
Albumin: 4 g/dL (ref 3.5–5.2)
BILIRUBIN TOTAL: 0.4 mg/dL (ref 0.2–1.2)
BUN: 12 mg/dL (ref 6–23)
CO2: 32 mEq/L (ref 19–32)
CREATININE: 0.54 mg/dL (ref 0.40–1.20)
Calcium: 9.9 mg/dL (ref 8.4–10.5)
Chloride: 97 mEq/L (ref 96–112)
GFR: 126.26 mL/min (ref >60.00)
GLUCOSE: 301 mg/dL — AB (ref 70–99)
Potassium: 3.9 mEq/L (ref 3.5–5.1)
SODIUM: 137 meq/L (ref 135–145)
TOTAL PROTEIN: 7.8 g/dL (ref 6.0–8.3)

## 2018-07-03 LAB — HEMOGLOBIN A1C: Hgb A1c MFr Bld: 13.3 % — ABNORMAL HIGH (ref 4.6–6.5)

## 2018-07-03 LAB — IRON: IRON: 53 ug/dL (ref 42–145)

## 2018-07-03 LAB — LDL CHOLESTEROL, DIRECT: Direct LDL: 129 mg/dL

## 2018-07-03 LAB — FERRITIN: Ferritin: 25 ng/mL (ref 10.0–291.0)

## 2018-07-03 NOTE — Assessment & Plan Note (Signed)
Stable Continue current medication F/U for new, worsening symptoms

## 2018-07-03 NOTE — Assessment & Plan Note (Signed)
Stable Continue current medications Update labs - CBC; Future - Lipid panel; Future - Comprehensive metabolic panel; Future

## 2018-07-03 NOTE — Assessment & Plan Note (Signed)
Discussed the role of healthy diet  in the management of diabetes, additional information provided in AVS, she declines referral to nutrition, has been in the past Update labs F/U with further recommendations pending lab results - Hemoglobin A1c; Future - Comprehensive metabolic panel; Future

## 2018-07-03 NOTE — Patient Instructions (Signed)
Please head downstairs for lab work. If any of your test results are critically abnormal, you will be contacted right away. Otherwise, I will contact you within a week about your test results and any recommendations for abnormalities.  I will follow up with further plan of care when your labs return.   Diabetes Mellitus and Nutrition When you have diabetes (diabetes mellitus), it is very important to have healthy eating habits because your blood sugar (glucose) levels are greatly affected by what you eat and drink. Eating healthy foods in the appropriate amounts, at about the same times every day, can help you:  Control your blood glucose.  Lower your risk of heart disease.  Improve your blood pressure.  Reach or maintain a healthy weight.  Every person with diabetes is different, and each person has different needs for a meal plan. Your health care provider may recommend that you work with a diet and nutrition specialist (dietitian) to make a meal plan that is best for you. Your meal plan may vary depending on factors such as:  The calories you need.  The medicines you take.  Your weight.  Your blood glucose, blood pressure, and cholesterol levels.  Your activity level.  Other health conditions you have, such as heart or kidney disease.  How do carbohydrates affect me? Carbohydrates affect your blood glucose level more than any other type of food. Eating carbohydrates naturally increases the amount of glucose in your blood. Carbohydrate counting is a method for keeping track of how many carbohydrates you eat. Counting carbohydrates is important to keep your blood glucose at a healthy level, especially if you use insulin or take certain oral diabetes medicines. It is important to know how many carbohydrates you can safely have in each meal. This is different for every person. Your dietitian can help you calculate how many carbohydrates you should have at each meal and for  snack. Foods that contain carbohydrates include:  Bread, cereal, rice, pasta, and crackers.  Potatoes and corn.  Peas, beans, and lentils.  Milk and yogurt.  Fruit and juice.  Desserts, such as cakes, cookies, ice cream, and candy.  How does alcohol affect me? Alcohol can cause a sudden decrease in blood glucose (hypoglycemia), especially if you use insulin or take certain oral diabetes medicines. Hypoglycemia can be a life-threatening condition. Symptoms of hypoglycemia (sleepiness, dizziness, and confusion) are similar to symptoms of having too much alcohol. If your health care provider says that alcohol is safe for you, follow these guidelines:  Limit alcohol intake to no more than 1 drink per day for nonpregnant women and 2 drinks per day for men. One drink equals 12 oz of beer, 5 oz of wine, or 1 oz of hard liquor.  Do not drink on an empty stomach.  Keep yourself hydrated with water, diet soda, or unsweetened iced tea.  Keep in mind that regular soda, juice, and other mixers may contain a lot of sugar and must be counted as carbohydrates.  What are tips for following this plan? Reading food labels  Start by checking the serving size on the label. The amount of calories, carbohydrates, fats, and other nutrients listed on the label are based on one serving of the food. Many foods contain more than one serving per package.  Check the total grams (g) of carbohydrates in one serving. You can calculate the number of servings of carbohydrates in one serving by dividing the total carbohydrates by 15. For example, if a food has  30 g of total carbohydrates, it would be equal to 2 servings of carbohydrates.  Check the number of grams (g) of saturated and trans fats in one serving. Choose foods that have low or no amount of these fats.  Check the number of milligrams (mg) of sodium in one serving. Most people should limit total sodium intake to less than 2,300 mg per day.  Always  check the nutrition information of foods labeled as "low-fat" or "nonfat". These foods may be higher in added sugar or refined carbohydrates and should be avoided.  Talk to your dietitian to identify your daily goals for nutrients listed on the label. Shopping  Avoid buying canned, premade, or processed foods. These foods tend to be high in fat, sodium, and added sugar.  Shop around the outside edge of the grocery store. This includes fresh fruits and vegetables, bulk grains, fresh meats, and fresh dairy. Cooking  Use low-heat cooking methods, such as baking, instead of high-heat cooking methods like deep frying.  Cook using healthy oils, such as olive, canola, or sunflower oil.  Avoid cooking with butter, cream, or high-fat meats. Meal planning  Eat meals and snacks regularly, preferably at the same times every day. Avoid going long periods of time without eating.  Eat foods high in fiber, such as fresh fruits, vegetables, beans, and whole grains. Talk to your dietitian about how many servings of carbohydrates you can eat at each meal.  Eat 4-6 ounces of lean protein each day, such as lean meat, chicken, fish, eggs, or tofu. 1 ounce is equal to 1 ounce of meat, chicken, or fish, 1 egg, or 1/4 cup of tofu.  Eat some foods each day that contain healthy fats, such as avocado, nuts, seeds, and fish. Lifestyle   Check your blood glucose regularly.  Exercise at least 30 minutes 5 or more days each week, or as told by your health care provider.  Take medicines as told by your health care provider.  Do not use any products that contain nicotine or tobacco, such as cigarettes and e-cigarettes. If you need help quitting, ask your health care provider.  Work with a Social worker or diabetes educator to identify strategies to manage stress and any emotional and social challenges. What are some questions to ask my health care provider?  Do I need to meet with a diabetes educator?  Do I need  to meet with a dietitian?  What number can I call if I have questions?  When are the best times to check my blood glucose? Where to find more information:  American Diabetes Association: diabetes.org/food-and-fitness/food  Academy of Nutrition and Dietetics: PokerClues.dk  [employer] of Diabetes and Digestive and Kidney Diseases (NIH): ContactWire.be Summary  A healthy meal plan will help you control your blood glucose and maintain a healthy lifestyle.  Working with a diet and nutrition specialist (dietitian) can help you make a meal plan that is best for you.  Keep in mind that carbohydrates and alcohol have immediate effects on your blood glucose levels. It is important to count carbohydrates and to use alcohol carefully. This information is not intended to replace advice given to you by your health care provider. Make sure you discuss any questions you have with your health care provider. Document Released: 08/17/2005 Document Revised: 12/25/2016 Document Reviewed: 12/25/2016 Elsevier Interactive Patient Education  [employer].

## 2018-07-03 NOTE — Assessment & Plan Note (Addendum)
Reports of nausea and body aches but not sure if this is related to statin therapy as symptoms seems to have started prior Continue atorvastatin Update labs- she Is fasting F/U with further recommendations pending lab results - CBC; Future - Lipid panel; Future The 10-year ASCVD risk score Mikey Bussing DC Brooke Bonito., et al., 2013) is: 5.2%   Values used to calculate the score:     Age: 52 years     Sex: Female     Is Non-Hispanic African American: No     Diabetic: Yes     Tobacco smoker: No     Systolic Blood Pressure: 025 mmHg     Is BP treated: Yes     HDL Cholesterol: 33 mg/dL     Total Cholesterol: 163 mg/dL

## 2018-07-03 NOTE — Progress Notes (Signed)
Name: Holly Ortiz   MRN: [id number]    DOB: 1966/04/22   Date:07/03/2018       Progress Note  Subjective  Chief Complaint  Chief Complaint  Patient presents with  . Establish Care    A1c checked, elevated sugar levels for a while    HPI Holly Ortiz is here today to establish care as a new patient to our practice. She had been working with PCP in [place] in the past, but moved to [facility] last July and has not re-established care with PCP until today. We will review her chronic conditions today including anemia, hyperlipidemia, hypertension, type II diabetes, anxiety and depression. She would also like to discuss hair loss.  Anemia- Past history of anemia which she reports was related to heavy vaginal bleeding, however she says today she has not had a period in 6 months. She was on iron supplement in the past but stopped taking due to constipation and GI upset  Anxiety and depression- Maintained on lexapro 20 daily for some time now without noted adverse effects She reports she is more anxious than depressed, and the lexapro seems to help her anxiety She denies thoughts of hurting herself or others  Cholesterol- maintained on atorvstatin 20 daily Reports daily medication compliance. Reports occasionally feels nauseated and also has body aches, leg pain most days, however these symptoms have been ongoing for years even prior to starting statin  Lab Results  Component Value Date   CHOL 163 01/18/2018   HDL 33 (L) 01/18/2018   LDLCALC 70 01/18/2018   LDLDIRECT 145.9 07/17/2011   TRIG 302 (H) 01/18/2018   CHOLHDL 5 07/17/2011   Hypertension -maintained on amlodipine 10, HCTZ 12.5 daily Reports daily medication compliance without noted adverse medication effects. Reports she occasionally checks her BP at home, readings normally 130s/80s Denies headaches, chest pain, shortness of breath, edema.  BP Readings from Last 3 Encounters:  07/03/18 132/78  04/02/18 (!) 148/73  01/23/18  (!) 172/98   Diabetes- Currently on [employer] 70/30 on her own, due to loss of insurance and unable to afford diabetes medications. She now has insurance back and ready to get her diabetes under control Her last Rx for diabetes medication was written for novolog 70/30 17 units a day, however she has increased to 40 units in the am and 40 units in the pm on her own due to high blood sugar readings, she says even at current dosage her am fasting glucose is in the 300s. She has been unable to tolerate actos and metformin in the past, and actually says she was hospitalized for AKI related to metformin this past spring. Denies tremors, sweats, episodes of hypoglycemia. Reports polyuria, polyphagia, polydipsia  Hair loss-  This is a new problem She reports hair falling out "in clumps" She says she has to wear hair back to cover missing patches. No rash, itching.  Patient Active Problem List   Diagnosis Date Noted  . DKA (diabetic ketoacidoses) ([place]) 03/28/2018  . Tachycardia 03/28/2018  . Urticaria 03/28/2018  . Hyperosmolar hyperglycemia 01/04/2018  . AKI (acute kidney injury) ([place]) 01/04/2018  . Hyperglycemia 01/03/2018  . 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-I)   . Asthma   . Diabetes mellitus   . Hyperlipidemia   . Hypertension     Past Surgical History:  Procedure Laterality Date  . CATARACT EXTRACTION, BILATERAL Bilateral 2014  . SOFT TISSUE MASS EXCISION Right 2016   [place] area  . [place]  Family History  Problem Relation Age of Onset  . Hyperlipidemia Other   . Heart disease Other   . Diabetes Father   . Hypertension Father   . COPD Father        Cause of death  . Colon cancer Mother   . Hypertension Mother   . Hypertension Sister   . Diabetes Sister   . Hypertension Brother   . Cancer Sister        Brain glioblastoma    Social History   Socioeconomic History  . Marital status: Married    Spouse name: Holly Ortiz  .  Number of children: 4  . Years of education: 13--1.5 years of college  . Highest education level: 12th grade  Occupational History  . Occupation: Science writer  Social Needs  . Financial resource strain: Not on file  . Food insecurity:    Worry: Sometimes true    Inability: Never true  . Transportation needs:    Medical: No    Non-medical: No  Tobacco Use  . Smoking status: Never Smoker  . Smokeless tobacco: Never Used  Substance and Sexual Activity  . Alcohol use: No  . Drug use: No  . Sexual activity: Yes    Birth control/protection: Surgical    Comment: BTL  Lifestyle  . Physical activity:    Days per week: Not on file    Minutes per session: Not on file  . Stress: Very much  Relationships  . Social connections:    Talks on phone: Not on file    Gets together: Not on file    Attends religious service: Not on file    Active member of club or organization: Not on file    Attends meetings of clubs or organizations: Not on file    Relationship status: Not on file  . Intimate partner violence:    Fear of current or ex partner: Not on file    Emotionally abused: No    Physically abused: No    Forced sexual activity: Not on file  Other Topics Concern  . Not on file  Social History Narrative  . Not on file     Current Outpatient Medications:  .  amLODipine (NORVASC) 10 MG tablet, Take 1 tablet (10 mg total) by mouth daily., Disp: 30 tablet, Rfl: 4 .  atorvastatin (LIPITOR) 20 MG tablet, Take 1 tablet (20 mg total) by mouth daily., Disp: 30 tablet, Rfl: 3 .  escitalopram (LEXAPRO) 20 MG tablet, Take 1 tablet (20 mg total) by mouth daily., Disp: 30 tablet, Rfl: 3 .  hydrochlorothiazide (HYDRODIURIL) 12.5 MG tablet, Take 12.5 mg by mouth daily., Disp: , Rfl:  .  insulin aspart (NOVOLOG) 100 UNIT/ML injection, Inject 0-20 Units into the skin 3 (three) times daily with meals., Disp: 10 mL, Rfl: 11 .  insulin aspart protamine- aspart (NOVOLOG MIX 70/30) (70-30) 100 UNIT/ML  injection, Inject 0.3 mLs (30 Units total) into the skin 2 (two) times daily with a meal., Disp: 10 mL, Rfl: 11 .  montelukast (SINGULAIR) 10 MG tablet, Take 1 tablet (10 mg total) by mouth at bedtime., Disp: 30 tablet, Rfl: 2  Allergies  Allergen Reactions  . Actos [Pioglitazone Hydrochloride] Other (See Comments)    Wt gain  . Lisinopril     Angioedema, including likely GI involvement  . Metformin And Related     Diarrhea and vomiting     Review of Systems  Constitutional: Negative for chills, diaphoresis and fever.  Eyes: Positive for blurred  vision.  Respiratory: Negative for shortness of breath.   Cardiovascular: Negative for chest pain, palpitations and leg swelling.  Gastrointestinal: Positive for nausea. Negative for diarrhea and vomiting.  Genitourinary: Positive for frequency.  Musculoskeletal: Positive for myalgias. Negative for falls.  Skin: Negative for itching and rash.  Neurological: Negative for headaches.  Endo/Heme/Allergies: Positive for polydipsia.  Psychiatric/Behavioral: Negative for depression and suicidal ideas. The patient is not nervous/anxious.     Objective  Vitals:   07/03/18 0837  BP: 132/78  Pulse: 86  Resp: 16  Temp: 98.5 F (36.9 C)  TempSrc: Oral  SpO2: 97%  Weight: 188 lb (85.3 kg)  Height: 5\' 1"  (1.549 m)    Body mass index is 35.52 kg/m.  Physical Exam Vital signs reviewed. Constitutional: Patient appears well-developed and well-nourished. No distress.  HENT: Head: Normocephalic and atraumatic. Nose: Nose normal. Mouth/Throat: Oropharynx is clear and moist. No oropharyngeal exudate.  Eyes: Conjunctivae and EOM are normal. Pupils are equal, round, and reactive to light. No scleral icterus.  Neck: Normal range of motion. Neck supple. No thyromegaly present. No cervical adenopathy. Cardiovascular: Normal rate, regular rhythm and normal heart sounds.  No murmur heard. No BLE edema. Distal pulses intact. Pulmonary/Chest: Effort  normal and breath sounds normal. No respiratory distress. Musculoskeletal: Normal range of motion, No gross deformities Neurological: She is alert and oriented to person, place, and time. No cranial nerve deficit. Coordination, balance, strength, speech and gait are normal.  Skin: Skin is warm and dry. No rash noted. No erythema.  Psychiatric: Patient has a normal mood and affect. behavior is normal. Judgment and thought content normal.   Assessment & Plan F/U TBD pending labs today TSH 04/01/18 WNL  Anemia, unspecified type Update labs F/U with further recommendations pending lab results - CBC; Future - Iron; Future - Ferritin; Future  Hair loss Recent TSH WNL per chart review, additional labs ordered today F/U with further recommendations pending lab results - CBC; Future - Iron; Future - Ferritin; Future

## 2018-07-08 ENCOUNTER — Other Ambulatory Visit: Payer: Self-pay | Admitting: Family

## 2018-07-08 DIAGNOSIS — E1165 Type 2 diabetes mellitus with hyperglycemia: Secondary | ICD-10-CM

## 2018-08-28 ENCOUNTER — Ambulatory Visit: Payer: 59 | Admitting: Endocrinology

## 2018-08-28 ENCOUNTER — Telehealth: Payer: Self-pay | Admitting: Endocrinology

## 2018-08-28 ENCOUNTER — Encounter: Payer: Self-pay | Admitting: Endocrinology

## 2018-08-28 VITALS — BP 126/82 | HR 97 | Ht 62.5 in | Wt 190.0 lb

## 2018-08-28 DIAGNOSIS — Z794 Long term (current) use of insulin: Secondary | ICD-10-CM

## 2018-08-28 DIAGNOSIS — E1165 Type 2 diabetes mellitus with hyperglycemia: Secondary | ICD-10-CM | POA: Diagnosis not present

## 2018-08-28 DIAGNOSIS — Z23 Encounter for immunization: Secondary | ICD-10-CM | POA: Diagnosis not present

## 2018-08-28 DIAGNOSIS — I10 Essential (primary) hypertension: Secondary | ICD-10-CM

## 2018-08-28 DIAGNOSIS — E782 Mixed hyperlipidemia: Secondary | ICD-10-CM

## 2018-08-28 MED ORDER — ONETOUCH VERIO VI STRP
ORAL_STRIP | 1 refills | Status: DC
Start: 2018-08-28 — End: 2018-12-23

## 2018-08-28 MED ORDER — INSULIN DEGLUDEC 200 UNIT/ML ~~LOC~~ SOPN
40.0000 [IU] | PEN_INJECTOR | Freq: Every day | SUBCUTANEOUS | 1 refills | Status: DC
Start: 2018-08-28 — End: 2018-09-18

## 2018-08-28 MED ORDER — INSULIN LISPRO 100 UNIT/ML (KWIKPEN): PEN_INJECTOR | SUBCUTANEOUS | 1 refills | Status: DC

## 2018-08-28 NOTE — Telephone Encounter (Signed)
She is taking Humalog before each meal and Ozempic once a week along with [place] once a day

## 2018-08-28 NOTE — Progress Notes (Signed)
Patient ID: Holly Ortiz, female   DOB: [birth year], 52 y.o.   MRN: [id number]          Reason for Appointment: Consultation for Type 2 Diabetes  Referring physician: Caesar Chestnut   History of Present Illness:          Date of diagnosis of type 2 diabetes mellitus:   At age 89      Background history:  Her diabetes was diagnosed incidentally when she was having unrelated problems She took various oral agents including metformin for several years About 5 years ago she was started on insulin and she apparently was taking only one kind of insulin possibly a premixed insulin twice a day She said that her blood sugars have been usually poorly controlled with A1c usually 8-9% She was also taking metformin, Amaryl and Januvia prior to moving here from [place]  Recent history:   Most recent A1c is 13.3  INSULIN regimen is: [employer] 70/30, 20 units before breakfast and dinner     Non-insulin hypoglycemic drugs the patient is taking are: None  Current management, blood sugar patterns and problems identified:  She thinks her blood sugars have been much higher since she has been unable to get her brand-name insulin and oral medications  She apparently was told not to take metformin when she was admitted for sepsis in 1/19, also was taking Amaryl and Januvia previously  She is using a [employer] meter and did not bring any record  She says her blood sugars are usually between 300-500 and higher in the evening before dinner  Not checking readings after meals   She says that she is too tired and not motivated to exercise  She does try to eat low-fat meals but not able to restrict carbohydrates like rice, bread and sweets  She does state thirsty but drinking water mostly and no juices or sweet drinks        Side effects from medications have been: None  Compliance with the medical regimen: Fair   Typical meal intake: Breakfast is  Bagel or egg. Rice, sweets.               Exercise: None   Glucose monitoring:  done 1-2 times a day         Glucometer: [employer] brand       Blood Glucose readings by recall: As above  Dietician visit, most recent: A few years ago in [place]  Weight history:  Wt Readings from Last 3 Encounters:  08/28/18 190 lb (86.2 kg)  07/03/18 188 lb (85.3 kg)  04/02/18 189 lb 14.4 oz (86.1 kg)    Glycemic control:   Lab Results  Component Value Date   HGBA1C 13.3 (H) 07/03/2018   HGBA1C 12.1 (H) 01/04/2018   HGBA1C 13.2 (H) 12/18/2017   Lab Results  Component Value Date   LDLCALC 70 01/18/2018   CREATININE 0.54 07/03/2018   No results found for: MICRALBCREAT  No results found for: FRUCTOSAMINE  No visits with results within 1 Week(s) from this visit.  Latest known visit with results is:  Appointment on 07/03/2018  Component Date Value Ref Range Status  . Ferritin 07/03/2018 25.0  10.0 - 291.0 ng/mL Final  . Iron 07/03/2018 53  42 - 145 ug/dL Final  . Sodium 07/03/2018 137  135 - 145 mEq/L Final  . Potassium 07/03/2018 3.9  3.5 - 5.1 mEq/L Final  . Chloride 07/03/2018 97  96 - 112 mEq/L Final  . CO2  07/03/2018 32  19 - 32 mEq/L Final  . Glucose, Bld 07/03/2018 301* 70 - 99 mg/dL Final  . BUN 07/03/2018 12  6 - 23 mg/dL Final  . Creatinine, Ser 07/03/2018 0.54  0.40 - 1.20 mg/dL Final  . Total Bilirubin 07/03/2018 0.4  0.2 - 1.2 mg/dL Final  . Alkaline Phosphatase 07/03/2018 73  39 - 117 U/L Final  . AST 07/03/2018 11  0 - 37 U/L Final  . ALT 07/03/2018 17  0 - 35 U/L Final  . Total Protein 07/03/2018 7.8  6.0 - 8.3 g/dL Final  . Albumin 07/03/2018 4.0  3.5 - 5.2 g/dL Final  . Calcium 07/03/2018 9.9  8.4 - 10.5 mg/dL Final  . GFR 07/03/2018 126.26  >60.00 mL/min Final  . Hgb A1c MFr Bld 07/03/2018 13.3* 4.6 - 6.5 % Final   Glycemic Control Guidelines for People with Diabetes:Non Diabetic:  <6%Goal of Therapy: <7%Additional Action Suggested:  >8%   . Cholesterol 07/03/2018 219* 0 - 200 mg/dL Final   ATP III  Classification       Desirable:  < 200 mg/dL               Borderline High:  200 - 239 mg/dL          High:  > = 240 mg/dL  . Triglycerides 07/03/2018 351.0* 0.0 - 149.0 mg/dL Final   Normal:  <150 mg/dLBorderline High:  150 - 199 mg/dL  . HDL 07/03/2018 38.80* >39.00 mg/dL Final  . VLDL 07/03/2018 70.2* 0.0 - 40.0 mg/dL Final  . Total CHOL/HDL Ratio 07/03/2018 6   Final                  Men          Women1/2 Average Risk     3.4          3.3Average Risk          5.0          4.42X Average Risk          9.6          7.13X Average Risk          15.0          11.0                      . NonHDL 07/03/2018 180.38   Final   NOTE:  Non-HDL goal should be 30 mg/dL higher than patient's LDL goal (i.e. LDL goal of < 70 mg/dL, would have non-HDL goal of < 100 mg/dL)  . WBC 07/03/2018 6.1  4.0 - 10.5 K/uL Final  . RBC 07/03/2018 5.43* 3.87 - 5.11 Mil/uL Final  . Platelets 07/03/2018 336.0  150.0 - 400.0 K/uL Final  . Hemoglobin 07/03/2018 13.8  12.0 - 15.0 g/dL Final  . HCT 07/03/2018 42.1  36.0 - 46.0 % Final  . MCV 07/03/2018 77.5* 78.0 - 100.0 fl Final  . MCHC 07/03/2018 32.8  30.0 - 36.0 g/dL Final  . RDW 07/03/2018 15.0  11.5 - 15.5 % Final  . Direct LDL 07/03/2018 129.0  mg/dL Final   Optimal:  <100 mg/dLNear or Above Optimal:  100-129 mg/dLBorderline High:  130-159 mg/dLHigh:  160-189 mg/dLVery High:  >190 mg/dL    Allergies as of 08/28/2018      Reactions   Actos [pioglitazone Hydrochloride] Other (See Comments)   Wt gain   Lisinopril    Angioedema, including likely GI involvement  Metformin And Related    Diarrhea and vomiting      Medication List        Accurate as of 08/28/18  4:28 PM. Always use your most recent med list.          amLODipine 10 MG tablet Commonly known as:  NORVASC Take 1 tablet (10 mg total) by mouth daily.   atorvastatin 20 MG tablet Commonly known as:  LIPITOR Take 1 tablet (20 mg total) by mouth daily.   escitalopram 20 MG tablet Commonly known as:   LEXAPRO Take 1 tablet (20 mg total) by mouth daily.   hydrochlorothiazide 12.5 MG tablet Commonly known as:  HYDRODIURIL Take 12.5 mg by mouth daily.   insulin aspart 100 UNIT/ML injection Commonly known as:  novoLOG Inject 0-20 Units into the skin 3 (three) times daily with meals.   insulin aspart protamine- aspart (70-30) 100 UNIT/ML injection Commonly known as:  NOVOLOG MIX 70/30 Inject 0.3 mLs (30 Units total) into the skin 2 (two) times daily with a meal.   Insulin Degludec 200 UNIT/ML Sopn Inject 40 Units into the skin daily.   insulin lispro 100 UNIT/ML KiwkPen Commonly known as:  HUMALOG 8-14 units at start of each meal as directed   montelukast 10 MG tablet Commonly known as:  SINGULAIR Take 1 tablet (10 mg total) by mouth at bedtime.       Allergies:  Allergies  Allergen Reactions  . Actos [Pioglitazone Hydrochloride] Other (See Comments)    Wt gain  . Lisinopril     Angioedema, including likely GI involvement  . Metformin And Related     Diarrhea and vomiting    Past Medical History:  Diagnosis Date  . Allergy   . 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-I)   . Asthma   . Diabetes mellitus    type II  . Hyperlipidemia   . Hypertension   . Urticaria, chronic   . UTI (lower urinary tract infection)     Past Surgical History:  Procedure Laterality Date  . CATARACT EXTRACTION, BILATERAL Bilateral 2014  . SOFT TISSUE MASS EXCISION Right 2016   [place] area  . TUBAL LIGATION  1995    Family History  Problem Relation Age of Onset  . Hyperlipidemia Other   . Heart disease Other   . Diabetes Father   . Hypertension Father   . COPD Father        Cause of death  . Colon cancer Mother   . Hypertension Mother   . Hypertension Sister   . Diabetes Sister   . Hypertension Brother   . Cancer Sister        Brain glioblastoma  . Diabetes Sister     Social History:  reports that she has never smoked. She has never used  smokeless tobacco. She reports that she does not drink alcohol or use drugs.   Review of Systems  Constitutional: Positive for weight gain.       Has gradually gained over the last few months about 20 pounds  HENT: Negative for headaches.   Eyes: Negative for blurred vision.  Respiratory: Negative for shortness of breath.   Cardiovascular: Negative for leg swelling.  Gastrointestinal: Negative for abdominal pain.  Endocrine: Positive for fatigue.  Genitourinary: Positive for frequency and nocturia.  Musculoskeletal: Negative for joint pain.  Skin:       Has chronic intermittent idiopathic hives  Neurological: Negative for numbness and tingling.  Psychiatric/Behavioral:  Depression treated with Lexapro     Lipid history: On Lipitor 20 mg but the last LDL was significantly high despite taking medication consistently    Lab Results  Component Value Date   CHOL 219 (H) 07/03/2018   HDL 38.80 (L) 07/03/2018   LDLCALC 70 01/18/2018   LDLDIRECT 129.0 07/03/2018   TRIG 351.0 (H) 07/03/2018   CHOLHDL 6 07/03/2018           Hypertension: Has been present since age 5, currently on amlodipine and HCTZ, may have had angioedema with lisinopril  BP Readings from Last 3 Encounters:  08/28/18 126/82  07/03/18 132/78  04/02/18 (!) 148/73    Most recent eye exam was in 2017  Most recent foot exam: 9/19  Currently known complications of diabetes: Microalbuminuria as of 1/19  LABS:  No visits with results within 1 Week(s) from this visit.  Latest known visit with results is:  Appointment on 07/03/2018  Component Date Value Ref Range Status  . Ferritin 07/03/2018 25.0  10.0 - 291.0 ng/mL Final  . Iron 07/03/2018 53  42 - 145 ug/dL Final  . Sodium 07/03/2018 137  135 - 145 mEq/L Final  . Potassium 07/03/2018 3.9  3.5 - 5.1 mEq/L Final  . Chloride 07/03/2018 97  96 - 112 mEq/L Final  . CO2 07/03/2018 32  19 - 32 mEq/L Final  . Glucose, Bld 07/03/2018 301* 70 - 99 mg/dL  Final  . BUN 07/03/2018 12  6 - 23 mg/dL Final  . Creatinine, Ser 07/03/2018 0.54  0.40 - 1.20 mg/dL Final  . Total Bilirubin 07/03/2018 0.4  0.2 - 1.2 mg/dL Final  . Alkaline Phosphatase 07/03/2018 73  39 - 117 U/L Final  . AST 07/03/2018 11  0 - 37 U/L Final  . ALT 07/03/2018 17  0 - 35 U/L Final  . Total Protein 07/03/2018 7.8  6.0 - 8.3 g/dL Final  . Albumin 07/03/2018 4.0  3.5 - 5.2 g/dL Final  . Calcium 07/03/2018 9.9  8.4 - 10.5 mg/dL Final  . GFR 07/03/2018 126.26  >60.00 mL/min Final  . Hgb A1c MFr Bld 07/03/2018 13.3* 4.6 - 6.5 % Final   Glycemic Control Guidelines for People with Diabetes:Non Diabetic:  <6%Goal of Therapy: <7%Additional Action Suggested:  >8%   . Cholesterol 07/03/2018 219* 0 - 200 mg/dL Final   ATP III Classification       Desirable:  < 200 mg/dL               Borderline High:  200 - 239 mg/dL          High:  > = 240 mg/dL  . Triglycerides 07/03/2018 351.0* 0.0 - 149.0 mg/dL Final   Normal:  <150 mg/dLBorderline High:  150 - 199 mg/dL  . HDL 07/03/2018 38.80* >39.00 mg/dL Final  . VLDL 07/03/2018 70.2* 0.0 - 40.0 mg/dL Final  . Total CHOL/HDL Ratio 07/03/2018 6   Final                  Men          Women1/2 Average Risk     3.4          3.3Average Risk          5.0          4.42X Average Risk          9.6          7.13X Average Risk  15.0          11.0                      . NonHDL 07/03/2018 180.38   Final   NOTE:  Non-HDL goal should be 30 mg/dL higher than patient's LDL goal (i.e. LDL goal of < 70 mg/dL, would have non-HDL goal of < 100 mg/dL)  . WBC 07/03/2018 6.1  4.0 - 10.5 K/uL Final  . RBC 07/03/2018 5.43* 3.87 - 5.11 Mil/uL Final  . Platelets 07/03/2018 336.0  150.0 - 400.0 K/uL Final  . Hemoglobin 07/03/2018 13.8  12.0 - 15.0 g/dL Final  . HCT 07/03/2018 42.1  36.0 - 46.0 % Final  . MCV 07/03/2018 77.5* 78.0 - 100.0 fl Final  . MCHC 07/03/2018 32.8  30.0 - 36.0 g/dL Final  . RDW 07/03/2018 15.0  11.5 - 15.5 % Final  . Direct LDL 07/03/2018  129.0  mg/dL Final   Optimal:  <100 mg/dLNear or Above Optimal:  100-129 mg/dLBorderline High:  130-159 mg/dLHigh:  160-189 mg/dLVery High:  >190 mg/dL    Physical Examination:  BP 126/82   Pulse 97   Ht 5' 2.5" (1.588 m)   Wt 190 lb (86.2 kg)   SpO2 97%   BMI 34.20 kg/m   GENERAL:         Patient has generalized obesity.    HEENT:         Eye exam shows normal external appearance.  Fundus exam shows no retinopathy.  Oral exam shows normal mucosa .   NECK:   There is no lymphadenopathy  Thyroid is not enlarged and no nodules felt.   Carotids are normal to palpation and no bruit heard  LUNGS:         Chest is symmetrical. Lungs are clear to auscultation.Marland Kitchen   HEART:         Heart sounds:  S1 and S2 are normal. No murmur or click heard., no S3 or S4.   ABDOMEN:   There is no distention present. Liver and spleen are not palpable.  No other mass or tenderness present.    NEUROLOGICAL:   Ankle jerks are absent bilaterally.    Diabetic Foot Exam - Simple   Simple Foot Form Diabetic Foot exam was performed with the following findings:  Yes 08/28/2018  3:16 PM  Visual Inspection No deformities, no ulcerations, no other skin breakdown bilaterally:  Yes Sensation Testing Intact to touch and monofilament testing bilaterally:  Yes Pulse Check Posterior Tibialis and Dorsalis pulse intact bilaterally:  Yes Comments            Vibration sense is  reduced in distal first toes.  MUSCULOSKELETAL:  There is no swelling or deformity of the peripheral joints.     EXTREMITIES:     There is no ankle edema.  SKIN:       No rash or lesions of concern.        ASSESSMENT:  Diabetes type 2, poorly controlled with marked hyperglycemia  See history of present illness for detailed discussion of current diabetes management, blood sugar patterns and problems identified  A1c has been mostly between 12-13 recently  She has had poor control and appears to be insulin deficient with worsening of  her control after stopping metformin Also appears to be somewhat insulin resistant with blood sugars markedly increased currently on a total of 40 units of insulin a day Also having weight gain with insulin alone She  does have excessive carbohydrate in her diet at times Also not exercising Not checking blood sugars after meals currently and using a generic monitor that cannot be downloaded   Complications of diabetes: Microalbuminuria but this has been measured in the face of poor diabetes control  HYPERLIPIDEMIA: Not controlled as of 7/19, currently followed by PCP on Lipitor  PLAN:   Discussed with the patient the nature of GLP-1 drugs, the action on various organ systems, improved satiety, how they benefit blood glucose control, as well as the benefit of weight loss. Explained possible side effects of TRULICITY, most commonly nausea that usually improves over time; discussed safety information in package insert.  Demonstrated the medication injection device and injection technique to the patient.  Showed patient possible injection sites To start with 0.25 mg dosage weekly for the first 4 injections and then increase dose up to 0.5 mg INSULIN: She will switch her 70/30 insulin to [place] and Humalog Today discussed in detail the need for mealtime insulin to cover postprandial spikes, action of mealtime insulin, use of the insulin pen, timing and action of the rapid acting insulin as well as starting dose and dosage titration to target the two-hour reading of under 180  She will start with 8 to 10 units at breakfast and lunch but may not need any skipping carbohydrates completely especially at breakfast  She will also take 12 to 14 units with evening meal  Discussed blood sugar targets after meals and she was given patient instruction form for adjusting the dose and timing of injection  BASAL insulin will be started with 30 units [place] and she will adjust the dose every 3 days by 4 units  until morning sugars are down to about 130  Consider adding metformin ER on the next visit, discussed that this should be safe as long as she is not dehydrated or having renal dysfunction  Moderation of carbohydrate  Regular walking for exercise as tolerated  Start using ONE TOUCH VERIO glucose monitor that was given to check sugars at least twice a day at various times  Discussed blood sugar targets  Influenza vaccine given  Patient Instructions  Check blood sugars on waking up daily  Also check blood sugars about 2 hours after a meal and do this after different meals by rotation  Recommended blood sugar levels on waking up is 90-130 and about 2 hours after meal is 130-160  Please bring your blood sugar monitor to each visit, thank you  Start OZEMPIC 0.25mg  using the pen as shown once weekly on the same day of the week.   You may inject in the stomach, thigh or arm as indicated in the brochure given. If you have any difficulties using the pen see the video at [employer].co.za  You will feel fullness of the stomach with starting the medication and should try to keep the portions at meals small.  You may experience nausea in the first few days which usually gets better over time    After 1 week go up to 0.5 mg weekly  If you have any questions or concerns please call the office   You may also talk to a nurse educator with [employer] at [phone] Useful website: [email or web address]   TRESIBA insulin  : This insulin provides blood sugar control for up to 24 hours.   Start with 30 units at bedtime daily and increase by 2 units every 3 days until the waking up sugars are under 130. Then continue the same  dose.  If blood sugar is under 90 for 2 days in a row, reduce the dose by 2 units.  Note that this insulin does not control the rise of blood sugar with meals    Prefer that you start walking or start other exercise as tolerated       Consultation note has been  sent to the referring physician  Elayne Snare 08/28/2018, 4:28 PM   Note: This office note was prepared with Dragon voice recognition system technology. Any transcriptional errors that result from this process are unintentional.

## 2018-08-28 NOTE — Patient Instructions (Addendum)
Check blood sugars on waking up daily  Also check blood sugars about 2 hours after a meal and do this after different meals by rotation  Recommended blood sugar levels on waking up is 90-130 and about 2 hours after meal is 130-160  Please bring your blood sugar monitor to each visit, thank you  Start OZEMPIC 0.25mg  using the pen as shown once weekly on the same day of the week.   You may inject in the stomach, thigh or arm as indicated in the brochure given. If you have any difficulties using the pen see the video at [employer].co.za  You will feel fullness of the stomach with starting the medication and should try to keep the portions at meals small.  You may experience nausea in the first few days which usually gets better over time    After 1 week go up to 0.5 mg weekly  If you have any questions or concerns please call the office   You may also talk to a nurse educator with [employer] at [phone] Useful website: [email or web address]   TRESIBA insulin  : This insulin provides blood sugar control for up to 24 hours.   Start with 30 units at bedtime daily and increase by 2 units every 3 days until the waking up sugars are under 130. Then continue the same dose.  If blood sugar is under 90 for 2 days in a row, reduce the dose by 2 units.  Note that this insulin does not control the rise of blood sugar with meals    Prefer that you start walking or start other exercise as tolerated

## 2018-08-28 NOTE — Telephone Encounter (Signed)
Pt is asking why the humalog was called in when she was told that Ozempic is what she is to be taking. I do see both medications listed in the note for her to switch to humalog and tresiba but also see that she was to start ozempic. Please clarify

## 2018-09-12 ENCOUNTER — Other Ambulatory Visit: Payer: Self-pay | Admitting: Endocrinology

## 2018-09-12 DIAGNOSIS — E1165 Type 2 diabetes mellitus with hyperglycemia: Secondary | ICD-10-CM

## 2018-09-12 DIAGNOSIS — Z794 Long term (current) use of insulin: Secondary | ICD-10-CM

## 2018-09-13 ENCOUNTER — Telehealth: Payer: Self-pay | Admitting: Endocrinology

## 2018-09-13 NOTE — Telephone Encounter (Signed)
Please advise 

## 2018-09-13 NOTE — Telephone Encounter (Signed)
Pt does not like the [place]. It makes her sick, dizzy, & breaking out in hives. Wants to see if there is another medication. Please advise pt

## 2018-09-15 NOTE — Telephone Encounter (Signed)
Holly Ortiz does not cause nausea, dizziness and hives.  Needs to discuss the symptoms with PCP.  She can if need be given a trial of Toujeo insulin instead of Tresiba, will need 10% more doses of Toujeo compared to [place]

## 2018-09-16 ENCOUNTER — Other Ambulatory Visit: Payer: 59

## 2018-09-17 NOTE — Telephone Encounter (Signed)
LMTCB

## 2018-09-18 ENCOUNTER — Ambulatory Visit: Payer: 59 | Admitting: Internal Medicine

## 2018-09-18 ENCOUNTER — Encounter: Payer: Self-pay | Admitting: Internal Medicine

## 2018-09-18 VITALS — BP 144/88 | HR 90 | Resp 16 | Ht 62.5 in | Wt 186.0 lb

## 2018-09-18 DIAGNOSIS — E119 Type 2 diabetes mellitus without complications: Secondary | ICD-10-CM | POA: Diagnosis not present

## 2018-09-18 DIAGNOSIS — Z794 Long term (current) use of insulin: Secondary | ICD-10-CM | POA: Diagnosis not present

## 2018-09-18 MED ORDER — INSULIN LISPRO PROT & LISPRO (75-25 MIX) 100 UNIT/ML KWIKPEN: 25.0000 [IU] | PEN_INJECTOR | Freq: Two times a day (BID) | SUBCUTANEOUS | 11 refills | Status: DC

## 2018-09-18 MED ORDER — METFORMIN HCL ER 500 MG PO TB24: 1000.0000 mg | ORAL_TABLET | Freq: Two times a day (BID) | ORAL | 11 refills | Status: DC

## 2018-09-18 MED ORDER — INSULIN PEN NEEDLE 32G X 4 MM MISC: 11 refills | Status: DC

## 2018-09-18 NOTE — Progress Notes (Signed)
Name: Holly Ortiz  Age/ Sex: 52 y.o., female   MRN/ DOB: [id number], 1966/01/25     PCP: Lance Sell, NP   Reason for Endocrinology Evaluation: Type 2 Diabetes Mellitus   PATIENT IDENTIFIER: Holly Ortiz is a 52 y.o. female with a past medical history of HTN, T2DM and depression. The patient has followed with Endocrinology clinic since 08/2018 for consultative assistance with management of her diabetes.  DIABETIC HISTORY:  Holly Ortiz was diagnosed with DM since the age 23. She was initially started on Metformin ,she has also been on Glimepiride and but subsequently started insulin therapy due to persistent hyperglycemia. Marland Kitchen Her hemoglobin A1c has ranged from 8.9 % in the past, peaking at 13.3 %  In 2019.  She moved from [place] in 2018 after her husband's retirement.   She used to be on Humalog Mix in [place] which worked good for her. She had to stop taking it when she moved to [place] due to lack of insurance and cost that's when she was switched to [place] Mix but she feels this " runs through her like water"   SUBJECTIVE:   During the last visit (08/28/2018): On her last visit her ReliON mix was switched to an MDI regimen of Tresiba and Humalog, the plan was to start her on Ozempic.   Today (09/19/2018): Ms. Bitting is here with her husband for a one follow up on her DM diabetes management.She is accompanied by her spouse. 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 She stopped taking the [place] because it made her " ill" she felt nauseous with stomach pains. She has been taking Humalog twice a day only as she does not feel comfortable injecting herself at work. She has been checking glucose once a day and her fasting glucose is averaging between 300-400 mg /dL. She also did not start Ozempic stating there was no prescription at the pharmacy. So for the past 3 weeks she has not been taking any meds  for her diabetes.  She does admit to non-compliance with diet.  Otherwise, the patient has not required any recent emergency interventions for hypoglycemia and has not had recent hospitalizations secondary to hyper or hypoglycemic episodes.    ROS: As per HPI and as detailed below: Review of Systems  Constitutional: Negative.   HENT: Negative.   Eyes: Negative.   Respiratory: Negative.   Cardiovascular: Negative.   Skin: Negative.   Neurological: Negative for tingling and tremors.  Psychiatric/Behavioral: Positive for depression.       Depression stable       HOME DIABETES REGIMEN:  Basal: Tyler Aas - NOT TAKING Bolus: Humalog 4-6 units BID  Ozempic- Not taking    Statin: Yes ACE-I/ARB: No Prior Diabetic Education: Yes    METER DOWNLOAD SUMMARY: Did not bring meter    DIABETIC COMPLICATIONS: Microvascular complications:    Denies: Neuropathy, retinopathy and nephropathy  Last Eye Exam: Completed in 2017  Macrovascular complications:    Denies: CAD, CVA, PVD   HISTORY:  Past Medical History:  Past Medical History:  Diagnosis Date  . Allergy   . 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-I)   . Asthma   . Diabetes mellitus    type II  . Hyperlipidemia   . Hypertension   . Urticaria, chronic   . UTI (lower urinary tract infection)    Past Surgical History:  Past Surgical History:  Procedure Laterality Date  . CATARACT EXTRACTION, BILATERAL Bilateral 2014  .  SOFT TISSUE MASS EXCISION Right 2016   [place] area  . TUBAL LIGATION  1995    Social History:  reports that she has never smoked. She has never used smokeless tobacco. She reports that she does not drink alcohol or use drugs. Family History:  Family History  Problem Relation Age of Onset  . Hyperlipidemia Other   . Heart disease Other   . Diabetes Father   . Hypertension Father   . COPD Father        Cause of death  . Colon cancer Mother   . Hypertension Mother   .  Hypertension Sister   . Diabetes Sister   . Hypertension Brother   . Cancer Sister        Brain glioblastoma  . Diabetes Sister      HOME MEDICATIONS: Allergies as of 09/18/2018      Reactions   Actos [pioglitazone Hydrochloride] Other (See Comments)   Wt gain   Lisinopril    Angioedema, including likely GI involvement   Metformin And Related    Diarrhea and vomiting      Medication List        Accurate as of 09/18/18 11:59 PM. Always use your most recent med list.          amLODipine 10 MG tablet Commonly known as:  NORVASC Take 1 tablet (10 mg total) by mouth daily.   atorvastatin 20 MG tablet Commonly known as:  LIPITOR Take 1 tablet (20 mg total) by mouth daily.   escitalopram 20 MG tablet Commonly known as:  LEXAPRO Take 1 tablet (20 mg total) by mouth daily.   hydrochlorothiazide 12.5 MG tablet Commonly known as:  HYDRODIURIL Take 12.5 mg by mouth daily.   insulin lispro 100 UNIT/ML KiwkPen Commonly known as:  HUMALOG 8-14 units at start of each meal as directed   Insulin Lispro Prot & Lispro (75-25) 100 UNIT/ML Kwikpen Commonly known as:  HUMALOG 75/25 MIX Inject 25 Units into the skin 2 (two) times daily with a meal.   Insulin Pen Needle 32G X 4 MM Misc Twice a day   metFORMIN 500 MG 24 hr tablet Commonly known as:  GLUCOPHAGE-XR Take 2 tablets (1,000 mg total) by mouth 2 (two) times daily before a meal.   montelukast 10 MG tablet Commonly known as:  SINGULAIR Take 1 tablet (10 mg total) by mouth at bedtime.   ONETOUCH VERIO test strip Generic drug:  glucose blood Use as instructed to check blood sugar 3 times daily        OBJECTIVE:   Vital Signs: BP (!) 144/88   Pulse 90   Resp 16   Ht 5' 2.5" (1.588 m)   Wt 84.4 kg   SpO2 97%   BMI 33.48 kg/m    Exam: General: Pt appears well and is in NAD  Hydration: Well-hydrated with moist mucous membranes and good skin turgor  HEENT: Head: Unremarkable with good dentition. Oropharynx  clear without exudate.  Eyes: External eye exam normal without stare, lid lag or exophthalmos.  EOM intact.  PERRL.  Neck: General: Supple without adenopathy. Thyroid: Thyroid size normal.  No goiter or nodules appreciated. No thyroid bruit.  Lungs: Clear with good BS bilat with no rales, rhonchi, or wheezes  Heart: RRR with normal S1 and S2 and no gallops; no murmurs; no rub  Abdomen: Normoactive bowel sounds, soft, nontender, without masses or organomegaly palpable  Extremities: No pretibial edema. No tremor. Normal strength and motion throughout. See  detailed diabetic foot exam below.  Skin: Normal texture and temperature to palpation. No rash noted. No Acanthosis nigricans/skin tags. No lipohypertrophy.  Neuro: MS is good with appropriate affect, pt is alert and Ox3     DATA REVIEWED: A1c:  Results for OMUNIQUE, PEDERSON (MRN [id number]) as of 09/19/2018 11:39  Ref. Range 07/03/2018 09:21  Hemoglobin A1C Latest Ref Range: 4.6 - 6.5 % 13.3 (H)        ASSESSMENT / PLAN / RECOMMENDATIONS:   1) Type 2 Diabetes Mellitus, poorly controlled,  Without complications - Most recent A1c of 13.3 %. Goal A1c < 7.0 %.    - Poorly controlled DM due to medication non-adherence and poor dietary choices. Her barriers were lack of medical insurance which has been resolved at this time. Another barrier is her discomfort giving herself insulin at work so she typically takes humalog at home with breakfast and supper. Given that she felt comfortable with Humalog Mix will restart that knowing that this is not optimal for glucose control but I would rather her take the humalog mix then not take anything at all as she has been doing the past 3 weeks. Tyler Aas gave her GI symptoms and she did not pick up Ozempic prescription stating the pharmacy did not have it.  - We also discussed restarting Metformin - I also explained to her that Metformin does not cause renal damage but due to dehydration it will get  accumulated and can cause lactic acidosis. Patient advised to stop Metformin should she have any nausea or vomiting. She has been able to tolerate it for years prior to this. Patient in agreement to restart Metformin. - Once she is on a steady dose of Metformin and insulin, may consider adding a GLP-1 agonist.    MEDICATIONS:  Stop Tyler Aas and Humalog  Start Humalog Mix 25 mg BID with meals.   Restart Metformin 500 mg XR to be gradually titrated to a total of 2000 mg daily   EDUCATION / INSTRUCTIONS:  BG monitoring instructions: Patient is instructed to check her blood sugars 3-4 times a day, before meals and bedtime  Call [facility] if: BG persistently < 70 or > 300. . I reviewed the Rule of 15 for the treatment of hypoglycemia in detail with the patient. Literature supplied.    2) Diabetic complications:   Eye: She does not have known diabetic retinopathy. Last eye exam was in 2017 . She was encouraged to schedule an eye exam appointment.   Neuro/ Feet: She does not have known diabetic peripheral neuropathy. She is not insensate).    Renal: Patient does not have known baseline CKD.    3) Lipids: Patient is on a statin.   4) Hypertension: Above goal of < 140/90 mmHg. Will continue to monitor.     F/U in 3 months    Signed electronically by:  Mack Guise, MD  [facility] Endocrinology  [facility] [address] [place], [place] [address] Phone: [phone] FAX: [phone]    CC: Lance Sell, NP [place] [address] Phone: [phone]  Fax: [phone]  Return to Endocrinology clinic as below: Future Appointments  Date Time Provider [place]  12/20/2018  8:30 AM Elayne Snare, MD [facility] None

## 2018-09-18 NOTE — Patient Instructions (Addendum)
-   Your blood pressure is high today, please make sure you take your medications daily, please address this with your primary care physician.  - It is difficult to make any assessment or treatment plans without sugar data, please bring your sugar logs or meter on your next visit.  - Start Humalog MIX at 25 units with Breakfast and Supper - Start Metformin1 pill daily x 1 week, then increase to 1 pill twice a day x 1 week, then increase to 2 pills in AM and 1 pill in PM, finally 2 pills twice a day. - Please call our office with any side effects - Please check glucose before meals and at bedtime

## 2018-09-25 NOTE — Telephone Encounter (Signed)
Pt has been followed up with by Dr. Kelton Pillar

## 2018-12-20 ENCOUNTER — Ambulatory Visit: Payer: 59 | Admitting: Nurse Practitioner

## 2018-12-20 ENCOUNTER — Ambulatory Visit: Payer: 59 | Admitting: Endocrinology

## 2018-12-20 DIAGNOSIS — Z0289 Encounter for other administrative examinations: Secondary | ICD-10-CM

## 2018-12-23 ENCOUNTER — Encounter (HOSPITAL_COMMUNITY): Payer: Self-pay | Admitting: *Deleted

## 2018-12-23 ENCOUNTER — Ambulatory Visit: Payer: 59 | Admitting: Nurse Practitioner

## 2018-12-23 ENCOUNTER — Emergency Department (HOSPITAL_COMMUNITY): Payer: 59

## 2018-12-23 ENCOUNTER — Emergency Department (HOSPITAL_COMMUNITY)
Admission: EM | Admit: 2018-12-23 | Discharge: 2018-12-23 | Disposition: A | Discharge status: Home / Self Care | Payer: 59 | Attending: Emergency Medicine | Admitting: Emergency Medicine

## 2018-12-23 ENCOUNTER — Telehealth: Payer: Self-pay | Admitting: Endocrinology

## 2018-12-23 ENCOUNTER — Encounter: Payer: Self-pay | Admitting: Nurse Practitioner

## 2018-12-23 VITALS — BP 150/80 | HR 102 | Ht 62.5 in | Wt 189.0 lb

## 2018-12-23 DIAGNOSIS — R739 Hyperglycemia, unspecified: Secondary | ICD-10-CM

## 2018-12-23 DIAGNOSIS — Z794 Long term (current) use of insulin: Secondary | ICD-10-CM | POA: Diagnosis not present

## 2018-12-23 DIAGNOSIS — Z79899 Other long term (current) drug therapy: Secondary | ICD-10-CM | POA: Diagnosis not present

## 2018-12-23 DIAGNOSIS — F419 Anxiety disorder, unspecified: Secondary | ICD-10-CM

## 2018-12-23 DIAGNOSIS — E1165 Type 2 diabetes mellitus with hyperglycemia: Secondary | ICD-10-CM | POA: Insufficient documentation

## 2018-12-23 DIAGNOSIS — R079 Chest pain, unspecified: Secondary | ICD-10-CM | POA: Diagnosis not present

## 2018-12-23 DIAGNOSIS — L509 Urticaria, unspecified: Secondary | ICD-10-CM

## 2018-12-23 DIAGNOSIS — F32A Depression, unspecified: Secondary | ICD-10-CM

## 2018-12-23 DIAGNOSIS — I10 Essential (primary) hypertension: Secondary | ICD-10-CM | POA: Diagnosis not present

## 2018-12-23 DIAGNOSIS — E118 Type 2 diabetes mellitus with unspecified complications: Secondary | ICD-10-CM | POA: Diagnosis not present

## 2018-12-23 DIAGNOSIS — R002 Palpitations: Secondary | ICD-10-CM | POA: Insufficient documentation

## 2018-12-23 DIAGNOSIS — E785 Hyperlipidemia, unspecified: Secondary | ICD-10-CM | POA: Diagnosis not present

## 2018-12-23 DIAGNOSIS — F329 Major depressive disorder, single episode, unspecified: Secondary | ICD-10-CM

## 2018-12-23 LAB — CBC
HCT: 42 % (ref 36.0–46.0)
Hemoglobin: 13 g/dL (ref 12.0–15.0)
MCH: 25.8 pg — ABNORMAL LOW (ref 26.0–34.0)
MCHC: 31 g/dL (ref 30.0–36.0)
MCV: 83.5 fL (ref 80.0–100.0)
Platelets: 270 10*3/uL (ref 150–400)
RBC: 5.03 MIL/uL (ref 3.87–5.11)
RDW: 12.9 % (ref 11.5–15.5)
WBC: 6.7 10*3/uL (ref 4.0–10.5)
nRBC: 0 % (ref 0.0–0.2)

## 2018-12-23 LAB — BASIC METABOLIC PANEL
Anion gap: 12 (ref 5–15)
BUN: 9 mg/dL (ref 6–20)
CO2: 24 mmol/L (ref 22–32)
Calcium: 9.3 mg/dL (ref 8.9–10.3)
Chloride: 98 mmol/L (ref 98–111)
Creatinine, Ser: 0.62 mg/dL (ref 0.44–1.00)
GFR calc Af Amer: >60 mL/min (ref >60)
GFR calc non Af Amer: >60 mL/min (ref >60)
Glucose, Bld: 581 mg/dL (ref 70–99)
Potassium: 4.6 mmol/L (ref 3.5–5.1)
SODIUM: 134 mmol/L — AB (ref 135–145)

## 2018-12-23 LAB — I-STAT TROPONIN, ED
Troponin i, poc: 0.08 ng/mL (ref 0.00–0.08)
Troponin i, poc: 0 ng/mL (ref 0.00–0.08)

## 2018-12-23 LAB — TSH: TSH: 1.497 u[IU]/mL (ref 0.350–4.500)

## 2018-12-23 MED ORDER — ATORVASTATIN CALCIUM 20 MG PO TABS: 20.0000 mg | ORAL_TABLET | Freq: Every day | ORAL | 1 refills | Status: DC

## 2018-12-23 MED ORDER — AMLODIPINE BESYLATE 10 MG PO TABS: 10.0000 mg | ORAL_TABLET | Freq: Every day | ORAL | 1 refills | Status: DC

## 2018-12-23 MED ORDER — SODIUM CHLORIDE 0.9 % IV BOLUS: 1000.0000 mL | Freq: Once | INTRAVENOUS | Status: DC

## 2018-12-23 MED ORDER — ESCITALOPRAM OXALATE 20 MG PO TABS: 20.0000 mg | ORAL_TABLET | Freq: Every day | ORAL | 1 refills | Status: DC

## 2018-12-23 MED ORDER — INSULIN ASPART 100 UNIT/ML ~~LOC~~ SOLN
10.0000 [IU] | Freq: Once | SUBCUTANEOUS | Status: AC
  Administered 2018-12-23: 10 [IU] via SUBCUTANEOUS

## 2018-12-23 MED ORDER — HYDROCHLOROTHIAZIDE 12.5 MG PO TABS: 12.5000 mg | ORAL_TABLET | Freq: Every day | ORAL | 1 refills | Status: DC

## 2018-12-23 MED ORDER — MONTELUKAST SODIUM 10 MG PO TABS: 10.0000 mg | ORAL_TABLET | Freq: Every day | ORAL | 1 refills | Status: DC

## 2018-12-23 MED ORDER — SODIUM CHLORIDE 0.9% FLUSH: 3.0000 mL | Freq: Once | INTRAVENOUS | Status: DC

## 2018-12-23 NOTE — Assessment & Plan Note (Signed)
BP slightly elevated today Continue current medications for now She is going to ER for further evaluation of CP RTC in about 1 month for f/u- recheck BP reading, consider medication adjustments if BP remains elevated  - amLODipine (NORVASC) 10 MG tablet; Take 1 tablet (10 mg total) by mouth daily.  Dispense: 90 tablet; Refill: 1 - hydrochlorothiazide (HYDRODIURIL) 12.5 MG tablet; Take 1 tablet (12.5 mg total) by mouth daily.  Dispense: 90 tablet; Refill: 1

## 2018-12-23 NOTE — ED Triage Notes (Signed)
Pt in c/o intermittent palpitations for the last few weeks, went to her PCP for a routine evaluation and HR was in the 110s, sent for evaluation

## 2018-12-23 NOTE — Discharge Instructions (Signed)
Please read and follow all provided instructions.  Your diagnoses today include:  1. Palpitations   2. Hyperglycemia without ketosis     Tests performed today include:  An EKG of your heart  A chest x-ray  Cardiac enzymes - a blood test for heart muscle damage  Blood counts and electrolytes  Thyroid test - was normal  Vital signs. See below for your results today.   Medications prescribed:   None  Take any prescribed medications only as directed.  Follow-up instructions: Please follow-up with your primary care provider as soon as you can for further evaluation of your symptoms.   Return instructions:  SEEK IMMEDIATE MEDICAL ATTENTION IF:  You have severe chest pain, especially if the pain is crushing or pressure-like and spreads to the arms, back, neck, or jaw, or if you have sweating, nausea (feeling sick to your stomach), or shortness of breath. THIS IS AN EMERGENCY. Don't wait to see if the pain will go away. Get medical help at once. Call 911 or 0 (operator). DO NOT drive yourself to the hospital.   Your chest pain gets worse and does not go away with rest.   You have an attack of chest pain lasting longer than usual, despite rest and treatment with the medications your caregiver has prescribed.   You wake from sleep with chest pain or shortness of breath.  You feel dizzy or faint.  You have chest pain not typical of your usual pain for which you originally saw your caregiver.   You have any other emergent concerns regarding your health.  Additional Information: Chest pain comes from many different causes. Your caregiver has diagnosed you as having chest pain that is not specific for one problem, but does not require admission.  You are at low risk for an acute heart condition or other serious illness.   Your vital signs today were: BP (!) 155/80    Pulse (!) 115    Temp 99 F (37.2 C) (Oral)    Resp 20    SpO2 96%  If your blood pressure (BP) was elevated  above 135/85 this visit, please have this repeated by your doctor within one month. --------------

## 2018-12-23 NOTE — Assessment & Plan Note (Signed)
Stable Continue current medications F/u for new, worsening symptoms - escitalopram (LEXAPRO) 20 MG tablet; Take 1 tablet (20 mg total) by mouth daily.  Dispense: 90 tablet; Refill: 1

## 2018-12-23 NOTE — Assessment & Plan Note (Signed)
Continue planned follow up with endocrinology

## 2018-12-23 NOTE — Patient Instructions (Signed)
Please go to the emergency room for further evaluation of your chest pain and racing heart beats   Nonspecific Chest Pain Chest pain can be caused by many different conditions. Some causes of chest pain can be life-threatening. These will require treatment right away. Serious causes of chest pain include:  Heart attack.  A tear in the body's main blood vessel.  Redness and swelling (inflammation) around your heart.  Blood clot in your lungs. Other causes of chest pain may not be so serious. These include:  Heartburn.  Anxiety or stress.  Damage to bones or muscles in your chest.  Lung infections. Chest pain can feel like:  Pain or discomfort in your chest.  Crushing, pressure, aching, or squeezing pain.  Burning or tingling.  Dull or sharp pain that is worse when you move, cough, or take a deep breath.  Pain or discomfort that is also felt in your back, neck, jaw, shoulder, or arm, or pain that spreads to any of these areas. It is hard to know whether your pain is caused by something that is serious or something that is not so serious. So it is important to see your doctor right away if you have chest pain. Follow these instructions at home: Medicines  Take over-the-counter and prescription medicines only as told by your doctor.  If you were prescribed an antibiotic medicine, take it as told by your doctor. Do not stop taking the antibiotic even if you start to feel better. Lifestyle   Rest as told by your doctor.  Do not use any products that contain nicotine or tobacco, such as cigarettes, e-cigarettes, and chewing tobacco. If you need help quitting, ask your doctor.  Do not drink alcohol.  Make lifestyle changes as told by your doctor. These may include: ? Getting regular exercise. Ask your doctor what activities are safe for you. ? Eating a heart-healthy diet. A diet and nutrition specialist (dietitian) can help you to learn healthy eating options. ? Staying at  a healthy weight. ? Treating diabetes or high blood pressure, if needed. ? Lowering your stress. Activities such as yoga and relaxation techniques can help. General instructions  Pay attention to any changes in your symptoms. Tell your doctor about them or any new symptoms.  Avoid any activities that cause chest pain.  Keep all follow-up visits as told by your doctor. This is important. You may need more testing if your chest pain does not go away. Contact a doctor if:  Your chest pain does not go away.  You feel depressed.  You have a fever. Get help right away if:  Your chest pain is worse.  You have a cough that gets worse, or you cough up blood.  You have very bad (severe) pain in your belly (abdomen).  You pass out (faint).  You have either of these for no clear reason: ? Sudden chest discomfort. ? Sudden discomfort in your arms, back, neck, or jaw.  You have shortness of breath at any time.  You suddenly start to sweat, or your skin gets clammy.  You feel sick to your stomach (nauseous).  You throw up (vomit).  You suddenly feel lightheaded or dizzy.  You feel very weak or tired.  Your heart starts to beat fast, or it feels like it is skipping beats. These symptoms may be an emergency. Do not wait to see if the symptoms will go away. Get medical help right away. Call your local emergency services (911 in the [place]  Do not drive yourself to the hospital. Summary  Chest pain can be caused by many different conditions. The cause may be serious and need treatment right away. If you have chest pain, see your doctor right away.  Follow your doctor's instructions for taking medicines and making lifestyle changes.  Keep all follow-up visits as told by your doctor. This includes visits for any further testing if your chest pain does not go away.  Be sure to know the signs that show that your condition has become worse. Get help right away if you have these  symptoms. This information is not intended to replace advice given to you by your health care provider. Make sure you discuss any questions you have with your health care provider. Document Released: 05/08/2008 Document Revised: 05/23/2018 Document Reviewed: 05/23/2018 Elsevier Interactive Patient Education  2019 [employer].

## 2018-12-23 NOTE — ED Provider Notes (Signed)
[facility] EMERGENCY DEPARTMENT Provider Note   CSN: [id number] Arrival date & time: 12/23/18  1755     History   Chief Complaint Chief Complaint  Patient presents with  . Palpitations    HPI Holly Ortiz is a 53 y.o. female.  Patient with history of hypertension, high cholesterol, diabetes presents the emergency department with complaint of tachycardia and chest tightness.  Patient has had episodes of tachypalpitations over the past several weeks.  Sometimes with these palpitations she will get the sensation of pressure in her chest.  During an episode today she felt pressure in her left arm as well.  No significant shortness of breath, lightheadedness, or syncope.  No nausea, vomiting, or diaphoresis with these episodes.  Patient went to her primary care today and had 2 EKGs showing tachycardia.  Given her associated chest pressure, she was advised to go to the emergency department for further evaluation.  She does not smoke.  No history of acute coronary syndrome in first-degree relatives. Patient denies risk factors for pulmonary embolism including: unilateral leg swelling, history of DVT/PE/other blood clots, use of exogenous hormones, recent immobilizations, recent surgery, recent travel (>4hr segment), malignancy, hemoptysis.       Past Medical History:  Diagnosis Date  . Allergy   . 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-I)   . Asthma   . Diabetes mellitus    type II  . Hyperlipidemia   . Hypertension   . Urticaria, chronic   . UTI (lower urinary tract infection)     Patient Active Problem List   Diagnosis Date Noted  . Anxiety and depression 07/03/2018  . DKA (diabetic ketoacidoses) ([place]) 03/28/2018  . Tachycardia 03/28/2018  . Urticaria 03/28/2018  . Hyperosmolar hyperglycemia 01/04/2018  . AKI (acute kidney injury) ([place]) 01/04/2018  . Hyperglycemia 01/03/2018  . Angioedema due to angiotensin converting enzyme  inhibitor (ACE-I)   . Asthma   . Type 2 diabetes mellitus with complication, with long-term current use of insulin ([place])   . Hyperlipidemia   . Hypertension     Past Surgical History:  Procedure Laterality Date  . CATARACT EXTRACTION, BILATERAL Bilateral 2014  . SOFT TISSUE MASS EXCISION Right 2016   [place] area  . TUBAL LIGATION  1995     OB History    Gravida  4   Para      Term      Preterm      AB      Living  4     SAB      TAB      Ectopic      Multiple      Live Births               Home Medications    Prior to Admission medications   Medication Sig Start Date End Date Taking? Authorizing Provider  amLODipine (NORVASC) 10 MG tablet Take 1 tablet (10 mg total) by mouth daily. 12/23/18   Lance Sell, NP  atorvastatin (LIPITOR) 20 MG tablet Take 1 tablet (20 mg total) by mouth daily. 12/23/18 12/23/19  Lance Sell, NP  escitalopram (LEXAPRO) 20 MG tablet Take 1 tablet (20 mg total) by mouth daily. 12/23/18   Lance Sell, NP  hydrochlorothiazide (HYDRODIURIL) 12.5 MG tablet Take 1 tablet (12.5 mg total) by mouth daily. 12/23/18   Lance Sell, NP  Insulin Lispro Prot & Lispro (HUMALOG MIX 75/25 KWIKPEN) (75-25) 100 UNIT/ML Kwikpen Inject 25 Units  into the skin 2 (two) times daily with a meal. 09/18/18   Shamleffer, Melanie Crazier, MD  Insulin Pen Needle (BD PEN NEEDLE NANO 2ND GEN) 32G X 4 MM MISC Twice a day 09/18/18   Shamleffer, Melanie Crazier, MD  montelukast (SINGULAIR) 10 MG tablet Take 1 tablet (10 mg total) by mouth at bedtime. 12/23/18   Lance Sell, NP    Family History Family History  Problem Relation Age of Onset  . Hyperlipidemia Other   . Heart disease Other   . Diabetes Father   . Hypertension Father   . COPD Father        Cause of death  . Colon cancer Mother   . Hypertension Mother   . Hypertension Sister   . Diabetes Sister   . Hypertension Brother   . Cancer Sister        Brain  glioblastoma  . Diabetes Sister     Social History Social History   Tobacco Use  . Smoking status: Never Smoker  . Smokeless tobacco: Never Used  Substance Use Topics  . Alcohol use: No  . Drug use: No     Allergies   Actos [pioglitazone hydrochloride]; Lisinopril; and Metformin and related   Review of Systems Review of Systems  Constitutional: Negative for diaphoresis and fever.  Eyes: Negative for redness.  Respiratory: Negative for cough and shortness of breath.   Cardiovascular: Positive for chest pain and palpitations. Negative for leg swelling.  Gastrointestinal: Negative for abdominal pain, nausea and vomiting.  Genitourinary: Negative for dysuria.  Musculoskeletal: Negative for back pain and neck pain.  Skin: Negative for rash.  Neurological: Negative for syncope and light-headedness.  Psychiatric/Behavioral: The patient is nervous/anxious.      Physical Exam Updated Vital Signs There were no vitals taken for this visit.  Physical Exam Vitals signs and nursing note reviewed.  Constitutional:      Appearance: She is well-developed. She is not diaphoretic.  HENT:     Head: Normocephalic and atraumatic.     Mouth/Throat:     Mouth: Mucous membranes are not dry.  Eyes:     Conjunctiva/sclera: Conjunctivae normal.  Neck:     Musculoskeletal: Normal range of motion and neck supple. No muscular tenderness.     Vascular: Normal carotid pulses. No carotid bruit or JVD.     Trachea: Trachea normal. No tracheal deviation.     Comments: No obvious goiter. Cardiovascular:     Rate and Rhythm: Regular rhythm. Tachycardia present.     Pulses: No decreased pulses.     Heart sounds: Normal heart sounds, S1 normal and S2 normal. No murmur.     Comments: Mild tachycardia, regular rhythm, no murmur. Pulmonary:     Effort: Pulmonary effort is normal. No respiratory distress.     Breath sounds: No wheezing.  Chest:     Chest wall: No tenderness.  Abdominal:      General: Bowel sounds are normal.     Palpations: Abdomen is soft.     Tenderness: There is no abdominal tenderness. There is no guarding or rebound.  Musculoskeletal: Normal range of motion.  Skin:    General: Skin is warm and dry.     Coloration: Skin is not pale.  Neurological:     Mental Status: She is alert.      ED Treatments / Results  Labs (all labs ordered are listed, but only abnormal results are displayed) Labs Reviewed  BASIC METABOLIC PANEL - Abnormal; Notable for  the following components:      Result Value   Sodium 134 (*)    Glucose, Bld 581 (*)    All other components within normal limits  CBC - Abnormal; Notable for the following components:   MCH 25.8 (*)    All other components within normal limits  TSH  I-STAT TROPONIN, ED  I-STAT TROPONIN, ED    EKG EKG Interpretation  Date/Time:  Monday December 23 2018 18:02:56 EST Ventricular Rate:  118 PR Interval:  172 QRS Duration: 84 QT Interval:  320 QTC Calculation: 448 R Axis:   44 Text Interpretation:  Sinus tachycardia Otherwise normal ECG Confirmed by Dene Gentry [phone]) on 12/23/2018 6:44:04 PM   Radiology Dg Chest 2 View  Result Date: 12/23/2018 CLINICAL DATA:  53 year old female with chest pain EXAM: CHEST - 2 VIEW COMPARISON:  Prior chest x-ray and CT scan of the chest 03/28/2018 FINDINGS: The lungs are clear and negative for focal airspace consolidation, pulmonary edema or suspicious pulmonary nodule. No pleural effusion or pneumothorax. Cardiac and mediastinal contours are within normal limits. No acute fracture or lytic or blastic osseous lesions. The visualized upper abdominal bowel gas pattern is unremarkable. IMPRESSION: No active cardiopulmonary disease. Electronically Signed   By: Jacqulynn Cadet M.D.   On: 12/23/2018 18:28    Procedures Procedures (including critical care time)  Medications Ordered in ED Medications  sodium chloride flush (NS) 0.9 % injection 3 mL (has no  administration in time range)  sodium chloride 0.9 % bolus 1,000 mL (has no administration in time range)  insulin aspart (novoLOG) injection 10 Units (10 Units Subcutaneous Given 12/23/18 2242)     Initial Impression / Assessment and Plan / ED Course  I have reviewed the triage vital signs and the nursing notes.  Pertinent labs & imaging results that were available during my care of the patient were reviewed by me and considered in my medical decision making (see chart for details).     Patient seen and examined. Work-up initiated.  Chest x-ray is negative.  I reviewed the 2 EKGs from primary care and also from here.  First EKG read as atrial flutter, however appears most consistent with sinus tachycardia.  Awaiting work-up.  Vital signs reviewed and are as follows: BP (!) 155/80   Pulse (!) 115   Temp 99 F (37.2 C) (Oral)   Resp 20   SpO2 96%   11:15 PM patient with elevated blood sugar without signs of DKA.  Fluids were ordered --however there is a delay in starting an IV as a nurse was in with another critical patient.  When they went to start fluids, patient declined.  She did allow repeat troponin to be drawn and for subcutaneous insulin to be given.  Second troponin 0.00.  I reviewed all results with patient and family at bedside.  Encouraged cardiology follow-up and referral given.  Encouraged return with chest pain, lightheadedness, passing out, shortness of breath, new symptoms or other concerns.  Patient verbalizes understanding agrees with plan.   Final Clinical Impressions(s) / ED Diagnoses   Final diagnoses:  Palpitations  Hyperglycemia without ketosis   Patient with tachypalpitations, sent from PCP.  EKG shows sinus tachycardia.  Patient with some associated chest and arm tightness.  Delta troponin here is negative.  Patient found to have hyperglycemia, no DKA.  She admits to not taking her insulin regularly today because of her PCP visit and subsequent ED visit.   She was treated with 10 units  subcutaneous insulin.  Patient is reliable to manage her blood sugars at home.  She refused IV fluids.  Low concern for ACS or PE at this time.  ED Discharge Orders    None       Carlisle Cater, Hershal Coria 12/23/18 2317    Valarie Merino, MD 12/24/18 [phone]

## 2018-12-23 NOTE — Telephone Encounter (Signed)
Follow up advised. Contact patient and schedule visit in _1-2___weeks

## 2018-12-23 NOTE — Telephone Encounter (Signed)
Patient no showed today's appt. Please advise on how to follow up. °A. No follow up necessary. °B. Follow up urgent. Contact patient immediately. °C. Follow up necessary. Contact patient and schedule visit in ___ days. °D. Follow up advised. Contact patient and schedule visit in ____weeks. ° °Would you like the NS fee to be applied to this visit? ° °

## 2018-12-23 NOTE — Assessment & Plan Note (Signed)
Stable Continue current medications  F/u for new, worsening symptoms - montelukast (SINGULAIR) 10 MG tablet; Take 1 tablet (10 mg total) by mouth at bedtime.  Dispense: 90 tablet; Refill: 1

## 2018-12-23 NOTE — Assessment & Plan Note (Signed)
Continue atorvastatin Lipid panel up to date - atorvastatin (LIPITOR) 20 MG tablet; Take 1 tablet (20 mg total) by mouth daily.  Dispense: 90 tablet; Refill: 1

## 2018-12-23 NOTE — Progress Notes (Signed)
Holly Ortiz is a 53 y.o. female with the following history as recorded in EpicCare:  Patient Active Problem List   Diagnosis Date Noted  . Anxiety and depression 07/03/2018  . DKA (diabetic ketoacidoses) ([place]) 03/28/2018  . Tachycardia 03/28/2018  . Urticaria 03/28/2018  . Hyperosmolar hyperglycemia 01/04/2018  . AKI (acute kidney injury) ([place]) 01/04/2018  . Hyperglycemia 01/03/2018  . 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-I)   . Asthma   . Type 2 diabetes mellitus with complication, with long-term current use of insulin ([place])   . Hyperlipidemia   . Hypertension     Current Outpatient Medications  Medication Sig Dispense Refill  . amLODipine (NORVASC) 10 MG tablet Take 1 tablet (10 mg total) by mouth daily. 90 tablet 1  . atorvastatin (LIPITOR) 20 MG tablet Take 1 tablet (20 mg total) by mouth daily. 90 tablet 1  . escitalopram (LEXAPRO) 20 MG tablet Take 1 tablet (20 mg total) by mouth daily. 90 tablet 1  . hydrochlorothiazide (HYDRODIURIL) 12.5 MG tablet Take 1 tablet (12.5 mg total) by mouth daily. 90 tablet 1  . Insulin Lispro Prot & Lispro (HUMALOG MIX 75/25 KWIKPEN) (75-25) 100 UNIT/ML Kwikpen Inject 25 Units into the skin 2 (two) times daily with a meal. 15 mL 11  . Insulin Pen Needle (BD PEN NEEDLE NANO 2ND GEN) 32G X 4 MM MISC Twice a day 100 each 11  . montelukast (SINGULAIR) 10 MG tablet Take 1 tablet (10 mg total) by mouth at bedtime. 90 tablet 1   No current facility-administered medications for this visit.     Allergies: Actos [pioglitazone hydrochloride]; Lisinopril; and Metformin and related  Past Medical History:  Diagnosis Date  . Allergy   . 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-I)   . Asthma   . Diabetes mellitus    type II  . Hyperlipidemia   . Hypertension   . Urticaria, chronic   . UTI (lower urinary tract infection)     Past Surgical History:  Procedure Laterality Date  . CATARACT EXTRACTION,  BILATERAL Bilateral 2014  . SOFT TISSUE MASS EXCISION Right 2016   [place] area  . TUBAL LIGATION  1995    Family History  Problem Relation Age of Onset  . Hyperlipidemia Other   . Heart disease Other   . Diabetes Father   . Hypertension Father   . COPD Father        Cause of death  . Colon cancer Mother   . Hypertension Mother   . Hypertension Sister   . Diabetes Sister   . Hypertension Brother   . Cancer Sister        Brain glioblastoma  . Diabetes Sister     Social History   Tobacco Use  . Smoking status: Never Smoker  . Smokeless tobacco: Never Used  Substance Use Topics  . Alcohol use: No     Subjective:  Holly Ortiz is here today with request for refills of her daily medications, Since our last visit has started following with endocrinology regularly for diabetes managment. She is also requesting evaluation of acute c/o CP. Accompanied by husband today  Hypertension -maintained on amlodipine 10, HCTZ 12.5 daily. Reports daily medication compliance without adverse medication effects. Reports she has not recently checked BP readings at home.  BP Readings from Last 3 Encounters:  12/23/18 (!) 150/80  09/18/18 (!) 144/88  08/28/18 126/82   HLD- maintained on atorvastatin 20 daily. Reports daily medication compliance without adverse  medication effects.  Lab Results  Component Value Date   CHOL 219 (H) 07/03/2018   HDL 38.80 (L) 07/03/2018   LDLCALC 70 01/18/2018   LDLDIRECT 129.0 07/03/2018   TRIG 351.0 (H) 07/03/2018   CHOLHDL 6 07/03/2018   Anxiety and depression-maintained on lexapro 20 daily without noted adverse effects and reports good control of mood.  Allergies-maintained on singulair 10 for past history of itching, urticaria, with no noted adverse medication effects and good control of symptoms.  Chest pain- This is a new problem, she reports 1 week history of intermittent heart racing, palpitations, chest pressure, decreased energy. Has noticed  heart rate up to 120s at home Symptoms occur throughout the day, not seeming to be related to any specific activity. She denies syncope, confusion, sob, edema, abdominal pain, nausea, vomiting.   ROS- See HPI  Objective:  Vitals:   12/23/18 1432  BP: (!) 150/80  Pulse: (!) 102  SpO2: 97%  Weight: 189 lb (85.7 kg)  Height: 5' 2.5" (1.588 m)    General: Well developed, well nourished, in no acute distress  Skin : Warm and dry.  Head: Normocephalic and atraumatic  Eyes: Sclera and conjunctiva clear; pupils round and reactive to light; extraocular movements intact  Oropharynx: Pink, supple. No suspicious lesions  Neck: Supple Lungs: Respirations unlabored; clear to auscultation bilaterally without wheeze, rales, rhonchi  CVS exam: normal rate, regular rhythm, normal S1, S2, no murmurs, rubs, clicks or gallops.  Extremities: No edema, cyanosis, clubbing  Vessels: Symmetric bilaterally  Neurologic: Alert and oriented; speech intact; face symmetrical; moves all extremities well; CNII-XII intact without focal deficit  Psychiatric: Normal mood and affect.  Assessment:  1. Chest pain, unspecified type   2. Essential hypertension   3. Type 2 diabetes mellitus with complication, with long-term current use of insulin ([place])   4. Hyperlipidemia, unspecified hyperlipidemia type   5. Anxiety and depression   6. Urticaria     Plan:   Chest pain, unspecified type Iintially her EKG today read atrial flutter, then repeat read sinus tachycardia without acute changes Heart sounds are normal on PE Due acute chest pressure, advised immediate transfer to ER, she declines EMS transport but will take herself to ED, [facility] charge nurse called to notify - EKG 12-Lead - EKG 12-Lead  Return in about 1 month (around 01/23/2019) for F/U: htn- recheck BP.  Orders Placed This Encounter  Procedures  . EKG 12-Lead    Order Specific Question:   Where should this test be performed?    Answer:   Other  . EKG  12-Lead    Order Specific Question:   Where should this test be performed?    Answer:   Other    Requested Prescriptions   Signed Prescriptions Disp Refills  . amLODipine (NORVASC) 10 MG tablet 90 tablet 1    Sig: Take 1 tablet (10 mg total) by mouth daily.  Marland Kitchen atorvastatin (LIPITOR) 20 MG tablet 90 tablet 1    Sig: Take 1 tablet (20 mg total) by mouth daily.  Marland Kitchen escitalopram (LEXAPRO) 20 MG tablet 90 tablet 1    Sig: Take 1 tablet (20 mg total) by mouth daily.  . montelukast (SINGULAIR) 10 MG tablet 90 tablet 1    Sig: Take 1 tablet (10 mg total) by mouth at bedtime.  . hydrochlorothiazide (HYDRODIURIL) 12.5 MG tablet 90 tablet 1    Sig: Take 1 tablet (12.5 mg total) by mouth daily.

## 2019-01-08 ENCOUNTER — Telehealth: Payer: Self-pay | Admitting: Endocrinology

## 2019-01-08 ENCOUNTER — Telehealth: Payer: Self-pay | Admitting: *Deleted

## 2019-01-08 DIAGNOSIS — R079 Chest pain, unspecified: Secondary | ICD-10-CM

## 2019-01-08 NOTE — Telephone Encounter (Signed)
It appears the ED may have already did referral, I have placed another referral as ewll

## 2019-01-08 NOTE — Telephone Encounter (Signed)
Copied from [place] [id number]. Topic: Referral - Request for Referral >> Jan 08, 2019  9:30 AM Rayann Heman wrote: Yes has discussed with PCP  Referral for which specialty: cardiology Preferred provider/office: Dr Meda Coffee [facility]#[phone]  [phone] Reason for referral:Recent ED visit

## 2019-01-08 NOTE — Telephone Encounter (Signed)
Left detailed message informing pt of below and provided [facility] phone number.

## 2019-01-08 NOTE — Telephone Encounter (Signed)
LMTCB and reschedule missed appointment from 12/20/2018

## 2019-01-14 ENCOUNTER — Other Ambulatory Visit: Payer: Self-pay | Admitting: Internal Medicine

## 2019-01-14 DIAGNOSIS — F32A Depression, unspecified: Secondary | ICD-10-CM

## 2019-01-14 DIAGNOSIS — F419 Anxiety disorder, unspecified: Principal | ICD-10-CM

## 2019-01-14 DIAGNOSIS — F329 Major depressive disorder, single episode, unspecified: Secondary | ICD-10-CM

## 2019-02-05 ENCOUNTER — Encounter: Payer: Self-pay | Admitting: Cardiology

## 2019-02-18 ENCOUNTER — Telehealth: Payer: Self-pay | Admitting: Cardiology

## 2019-02-18 NOTE — Telephone Encounter (Signed)
Called and left voicemail regarding rescheduling secondary to coronavirus.  Asked her to call our clinic back at [phone] to make sure that this is okay and that she does not have any urgent needs.  Candee Furbish, MD

## 2019-02-20 ENCOUNTER — Ambulatory Visit: Payer: 59 | Admitting: Cardiology

## 2019-02-20 ENCOUNTER — Encounter: Payer: Self-pay | Admitting: Cardiology

## 2019-02-20 ENCOUNTER — Other Ambulatory Visit: Payer: Self-pay

## 2019-02-20 VITALS — BP 138/80 | HR 94 | Ht 62.5 in | Wt 185.6 lb

## 2019-02-20 DIAGNOSIS — E785 Hyperlipidemia, unspecified: Secondary | ICD-10-CM

## 2019-02-20 DIAGNOSIS — E1169 Type 2 diabetes mellitus with other specified complication: Secondary | ICD-10-CM

## 2019-02-20 DIAGNOSIS — R0789 Other chest pain: Secondary | ICD-10-CM | POA: Diagnosis not present

## 2019-02-20 NOTE — Progress Notes (Signed)
Cardiology Office Note:    Date:  02/20/2019   ID:  [name], DOB 26-Sep-1966, MRN [id number]  PCP:  Lance Sell, NP  Cardiologist:  No primary care provider on file.  Electrophysiologist:  None   Referring MD: Lance Sell, NP    History of Present Illness:    Holly Ortiz is a 53 y.o. female here for the evaluation of chest pain, recently seen in the emergency department at the request of Gayla Medicus, NP.  Has hypertension hyperlipidemia and diabetes presented with tachycardia and chest tightness to the emergency room on 12/23/2018.  During these episodes of palpitations she felt some sensation of pressure in her chest.  No shortness of breath syncope lightheadedness.  EKG personally reviewed demonstrated sinus tachycardia.  She visited her primary care doctor on the day of ER visit.  No early family history of CAD, no smoking.  No history of embolism.  In the emergency department, her blood glucose was 581.  Troponins were normal.  Since then, cut her coffee HR now in 90's. Occasional increased. Not pain, does  in and have her activity level is high sitting there. Anxiety. 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   Hives since 2007. Unknown etiology.   Echocardiogram on 03/29/2018: - Left ventricle: The  So I like the fact that he explored decreasing the cough contributing I did I am very glad actually had a little over a year ago you had an ultrasound done of your heartcavity size was normal. Wall thickness was   normal. Systolic function was normal. The estimated ejection   fraction was in the range of 55% to 60%. Left ventricular   diastolic function parameters were normal for the patient&'s age. - Aortic valve: Valve area (VTI): 2.93 cm^2. Valve area (Vmax):   3.17 cm^2. Valve area (Vmean): 2.95 cm^2. - Mitral valve: Valve area by pressure half-time: 2.18 cm^2.  Past Medical History:  Diagnosis Date   Allergy    Angioedema due to angiotensin converting enzyme  inhibitor (ACE-I)    Asthma    Diabetes mellitus    type II   Hyperlipidemia    Hypertension    Urticaria, chronic    UTI (lower urinary tract infection)     Past Surgical History:  Procedure Laterality Date   CATARACT EXTRACTION, BILATERAL Bilateral 2014   SOFT TISSUE MASS EXCISION Right 2016   [place]   TUBAL LIGATION  1995    Current Medications: Current Meds  Medication Sig   amLODipine (NORVASC) 10 MG tablet Take 1 tablet (10 mg total) by mouth daily.   atorvastatin (LIPITOR) 20 MG tablet Take 1 tablet (20 mg total) by mouth daily.   escitalopram (LEXAPRO) 20 MG tablet Take 1 tablet (20 mg total) by mouth daily.   hydrochlorothiazide (HYDRODIURIL) 12.5 MG tablet Take 1 tablet (12.5 mg total) by mouth daily.   Insulin Lispro Prot & Lispro (HUMALOG MIX 75/25 KWIKPEN) (75-25) 100 UNIT/ML Kwikpen Inject 25 Units into the skin 2 (two) times daily with a meal.   Insulin Pen Needle (BD PEN NEEDLE NANO 2ND GEN) 32G X 4 MM MISC Twice a day   montelukast (SINGULAIR) 10 MG tablet Take 1 tablet (10 mg total) by mouth at bedtime.     Allergies:   Actos [pioglitazone hydrochloride]; Lisinopril; and Metformin and related   Social History   Socioeconomic History   Marital status: Married    Spouse name: Audelia Hives   Number of children: 4   Years of education: 13--1.5 years of  college   Highest education level: 12th grade  Occupational History   Occupation: Environmental manager strain: Not on file   Food insecurity:    Worry: Sometimes true    Inability: Never true   Transportation needs:    Medical: No    Non-medical: No  Tobacco Use   Smoking status: Never Smoker   Smokeless tobacco: Never Used  Substance and Sexual Activity   Alcohol use: No   Drug use: No   Sexual activity: Yes    Birth control/protection: Surgical    Comment: BTL  Lifestyle   Physical activity:    Days per week: Not on file    Minutes per  session: Not on file   Stress: Very much  Relationships   Social connections:    Talks on phone: Not on file    Gets together: Not on file    Attends religious service: Not on file    Active member of club or organization: Not on file    Attends meetings of clubs or organizations: Not on file    Relationship status: Not on file  Other Topics Concern   Not on file  Social History Narrative   Not on file     Family History: The patient's family history includes COPD in her father; Cancer in her sister; Colon cancer in her mother; Diabetes in her father, sister, and sister; Heart disease in an other family member; Hyperlipidemia in an other family member; Hypertension in her brother, father, mother, and sister.  ROS:   Please see the history of present illness.    Minimal shortness of breath with activity, hyperglycemia transiently.  Denies any fevers chills nausea vomiting syncope bleeding all other systems reviewed and are negative.  EKGs/Labs/Other Studies Reviewed:    The following studies were reviewed today: Prior ER note office note echocardiogram EKG CT scan personally reviewed, no significant coronary artery calcification.  Minimal aortic atherosclerosis noted.  EKG:  EKG is not ordered today.   Recent Labs: 03/30/2018: B Natriuretic Peptide 39.2 07/03/2018: ALT 17 12/23/2018: BUN 9; Creatinine, Ser 0.62; Hemoglobin 13.0; Platelets 270; Potassium 4.6; Sodium 134; TSH 1.497  Recent Lipid Panel    Component Value Date/Time   CHOL 219 (H) 07/03/2018 0921   CHOL 163 01/18/2018 0859   TRIG 351.0 (H) 07/03/2018 0921   HDL 38.80 (L) 07/03/2018 0921   HDL 33 (L) 01/18/2018 0859   CHOLHDL 6 07/03/2018 0921   VLDL 70.2 (H) 07/03/2018 0921   LDLCALC 70 01/18/2018 0859   LDLDIRECT 129.0 07/03/2018 0921    Physical Exam:    VS:  BP 138/80    Pulse 94    Ht 5' 2.5" (1.588 m)    Wt 185 lb 9.6 oz (84.2 kg)    SpO2 97%    BMI 33.41 kg/m     Wt Readings from Last 3  Encounters:  02/20/19 185 lb 9.6 oz (84.2 kg)  12/23/18 189 lb (85.7 kg)  09/18/18 186 lb (84.4 kg)     GEN:  Well nourished, well developed in no acute distress HEENT: Normal NECK: No JVD; No carotid bruits LYMPHATICS: No lymphadenopathy CARDIAC: RRR, no murmurs, rubs, gallops RESPIRATORY:  Clear to auscultation without rales, wheezing or rhonchi  ABDOMEN: Soft, non-tender, non-distended MUSCULOSKELETAL:  No edema; No deformity  SKIN: Warm and dry NEUROLOGIC:  Alert and oriented x 3 PSYCHIATRIC:  Normal affect   ASSESSMENT:    1. Atypical chest pain  2. Type 2 diabetes mellitus with hyperlipidemia ([place])    PLAN:    In order of problems listed above:  Tachycardia/atypical chest pain/diabetes with hyperlipidemia/aortic atherosclerosis - This sensation occurs at rest, nonexertional atypical in nature.  She does have risk factor of diabetes mellitus.  Non-smoker, no early family history of coronary artery disease.  LDL 129 on 2019.  She is taking atorvastatin 20 mg a day.  At this time, given her normal echocardiogram, I would not pursue any further cardiac testing.  Of course if symptoms become more significant or more worrisome, she can always contact us and we can have low threshold for stress testing.  I encouraged her that her symptoms did improve after decreasing caffeine intake.  Encouraged her to increase her daily exercise.  She does admit to eating sweets frequently.  She is working with an endocrinologist to help improve her blood sugars but these have been difficult to control over the years.  Explained the physiology behind hyperglycemia and dehydration and potential tachycardia.  Personally reviewed CT scan, there is no evidence of coronary artery calcification present.  She does have very minimal aortic atherosclerosis.  She is on statin therapy as well as aspirin.  At this time, we will see her back on as-needed basis.  Medication Adjustments/Labs and Tests  Ordered: Current medicines are reviewed at length with the patient today.  Concerns regarding medicines are outlined above.  No orders of the defined types were placed in this encounter.  No orders of the defined types were placed in this encounter.   Patient Instructions  Medication Instructions:  No changes If you need a refill on your cardiac medications before your next appointment, please call your pharmacy.   Lab work: none If you have labs (blood work) drawn today and your tests are completely normal, you will receive your results only by:  [place] (if you have MyChart) OR  A paper copy in the mail If you have any lab test that is abnormal or we need to change your treatment, we will call you to review the results.  Testing/Procedures: none  Follow-Up: Your physician recommends that you schedule a follow-up appointment as needed with Dr. Marlou Porch.  Any Other Special Instructions Will Be Listed Below (If Applicable).       Signed, Candee Furbish, MD  02/20/2019 4:05 PM    [place]

## 2019-02-20 NOTE — Patient Instructions (Signed)
Medication Instructions:  No changes If you need a refill on your cardiac medications before your next appointment, please call your pharmacy.   Lab work: none If you have labs (blood work) drawn today and your tests are completely normal, you will receive your results only by: [facility] MyChart Message (if you have MyChart) OR . A paper copy in the mail If you have any lab test that is abnormal or we need to change your treatment, we will call you to review the results.  Testing/Procedures: none  Follow-Up: Your physician recommends that you schedule a follow-up appointment as needed with Dr. Marlou Porch.  Any Other Special Instructions Will Be Listed Below (If Applicable).

## 2019-02-22 ENCOUNTER — Other Ambulatory Visit: Payer: Self-pay | Admitting: Internal Medicine

## 2019-02-22 DIAGNOSIS — F329 Major depressive disorder, single episode, unspecified: Secondary | ICD-10-CM

## 2019-02-22 DIAGNOSIS — F32A Depression, unspecified: Secondary | ICD-10-CM

## 2019-02-22 DIAGNOSIS — F419 Anxiety disorder, unspecified: Principal | ICD-10-CM

## 2019-02-24 ENCOUNTER — Other Ambulatory Visit: Payer: Self-pay | Admitting: *Deleted

## 2019-02-24 DIAGNOSIS — L509 Urticaria, unspecified: Secondary | ICD-10-CM

## 2019-02-24 MED ORDER — MONTELUKAST SODIUM 10 MG PO TABS: 10.0000 mg | ORAL_TABLET | Freq: Every day | ORAL | 0 refills | Status: DC

## 2019-05-21 ENCOUNTER — Other Ambulatory Visit: Payer: Self-pay | Admitting: *Deleted

## 2019-05-21 DIAGNOSIS — I10 Essential (primary) hypertension: Secondary | ICD-10-CM

## 2019-05-21 MED ORDER — HYDROCHLOROTHIAZIDE 12.5 MG PO TABS: 12.5000 mg | ORAL_TABLET | Freq: Every day | ORAL | 0 refills | Status: DC

## 2019-06-30 NOTE — Pre-Procedure Instructions (Signed)
[place], [place] - [address] [place] [place] [address] Phone: [phone] Fax: [facility] [place]), [place] - [address] [place]) [place] [address] Phone: [phone] Fax: [phone]      Your procedure is scheduled on Tuesday 07-08-19  Report to [facility] Main Entrance "A" at 0530 A.M., and check in at the Admitting office.  Call this number if you have problems the morning of surgery:  [phone]  Call [phone] if you have any questions prior to your surgery date Monday-Friday 8am-4pm    Remember:  Do not eat or drink after midnight the night before your surgery  You may drink clear liquids until 0415 AM the morning of your surgery.   Clear liquids allowed are: Water, Non-Citrus Juices (without pulp), Carbonated Beverages, Clear Tea, Black Coffee Only, and Gatorade   Take these medicines the morning of surgery with A SIP OF WATER : amLODipine (NORVASC) atorvastatin (LIPITOR) escitalopram (LEXAPRO)  7 days prior to surgery STOP taking any Aspirin (unless otherwise instructed by your surgeon), Aleve, Naproxen, Ibuprofen, Motrin, Advil, Goody's, BC's, all herbal medications, fish oil, and all vitamins.   WHAT DO I DO ABOUT MY DIABETES MEDICATION?  [place] Kitchen Do not take oral diabetes medicines (pills) the morning of surgery.  The day before surgery inject 20 U of Insulin Lispro(Humalog) before meals.Do not inject on the day of surgery.  . If your CBG is greater than 220 mg/dL, you may take  of your sliding scale (correction) dose of insulin.   How to Manage Your Diabetes Before and After Surgery  Why is it important to control my blood sugar before and after surgery? . Improving blood sugar levels before and after surgery helps healing and can limit problems. . A way of improving blood sugar control is eating a healthy diet by: o  Eating  less sugar and carbohydrates o  Increasing activity/exercise o  Talking with your doctor about reaching your blood sugar goals . High blood sugars (greater than 180 mg/dL) can raise your risk of infections and slow your recovery, so you will need to focus on controlling your diabetes during the weeks before surgery. . Make sure that the doctor who takes care of your diabetes knows about your planned surgery including the date and location.  How do I manage my blood sugar before surgery? . Check your blood sugar at least 4 times a day, starting 2 days before surgery, to make sure that the level is not too high or low. o Check your blood sugar the morning of your surgery when you wake up and every 2 hours until you get to the Short Stay unit. . If your blood sugar is less than 70 mg/dL, you will need to treat for low blood sugar: o Do not take insulin. o Treat a low blood sugar (less than 70 mg/dL) with  cup of clear juice (cranberry or apple), 4 glucose tablets, OR glucose gel. o Recheck blood sugar in 15 minutes after treatment (to make sure it is greater than 70 mg/dL). If your blood sugar is not greater than 70 mg/dL on recheck, call [phone] for further instructions. . Report your blood sugar to the short stay nurse when you get to Short Stay.  . If you are admitted to the hospital after surgery: o Your blood sugar will be checked by the staff and you will probably be given insulin after surgery (  instead of oral diabetes medicines) to make sure you have good blood sugar levels. o The goal for blood sugar control after surgery is 80-180 mg/dL.    The Morning of Surgery  Do not wear jewelry, make-up or nail polish.  Do not wear lotions, powders, or perfumes, or deodorant  Do not shave 48 hours prior to surgery.   Do not bring valuables to the hospital.  [facility] is not responsible for any belongings or valuables.  If you are a smoker, DO NOT Smoke 24 hours prior to surgery IF you  wear a CPAP at night please bring your mask, tubing, and machine the morning of surgery   Remember that you must have someone to transport you home after your surgery, and remain with you for 24 hours if you are discharged the same day.   Contacts, glasses, hearing aids, dentures or bridgework may not be worn into surgery.    Leave your suitcase in the car.  After surgery it may be brought to your room.  For patients admitted to the hospital, discharge time will be determined by your treatment team.  Patients discharged the day of surgery will not be allowed to drive home.    Special instructions:   [facility]- Preparing For Surgery  Before surgery, you can play an important role. Because skin is not sterile, your skin needs to be as free of germs as possible. You can reduce the number of germs on your skin by washing with CHG (chlorahexidine gluconate) Soap before surgery.  CHG is an antiseptic cleaner which kills germs and bonds with the skin to continue killing germs even after washing.    Oral Hygiene is also important to reduce your risk of infection.  Remember - BRUSH YOUR TEETH THE MORNING OF SURGERY WITH YOUR REGULAR TOOTHPASTE  Please do not use if you have an allergy to CHG or antibacterial soaps. If your skin becomes reddened/irritated stop using the CHG.  Do not shave (including legs and underarms) for at least 48 hours prior to first CHG shower. It is OK to shave your face.  Please follow these instructions carefully.   1. Shower the NIGHT BEFORE SURGERY and the MORNING OF SURGERY with CHG Soap.   2. If you chose to wash your hair, wash your hair first as usual with your normal shampoo.  3. After you shampoo, rinse your hair and body thoroughly to remove the shampoo.  4. Use CHG as you would any other liquid soap. You can apply CHG directly to the skin and wash gently with a scrungie or a clean washcloth.   5. Apply the CHG Soap to your body ONLY FROM THE NECK DOWN.   Do not use on open wounds or open sores. Avoid contact with your eyes, ears, mouth and genitals (private parts). Wash Face and genitals (private parts)  with your normal soap.   6. Wash thoroughly, paying special attention to the area where your surgery will be performed.  7. Thoroughly rinse your body with warm water from the neck down.  8. DO NOT shower/wash with your normal soap after using and rinsing off the CHG Soap.  9. Pat yourself dry with a CLEAN TOWEL.  10. Wear CLEAN PAJAMAS to bed the night before surgery, wear comfortable clothes the morning of surgery  11. Place CLEAN SHEETS on your bed the night of your first shower and DO NOT SLEEP WITH PETS.   Day of Surgery:  Do not apply any deodorants/lotions.  Please shower the morning of surgery with the CHG soap  Please wear clean clothes to the hospital/surgery center.   Remember to brush your teeth WITH YOUR REGULAR TOOTHPASTE.  Please read over the following fact sheets that you were given.

## 2019-07-01 ENCOUNTER — Encounter (HOSPITAL_COMMUNITY): Payer: Self-pay

## 2019-07-01 ENCOUNTER — Encounter (HOSPITAL_COMMUNITY)
Admission: RE | Admit: 2019-07-01 | Discharge: 2019-07-01 | Disposition: A | Discharge status: Home / Self Care | Payer: 59 | Source: Ambulatory Visit | Attending: Obstetrics and Gynecology | Admitting: Obstetrics and Gynecology

## 2019-07-01 ENCOUNTER — Other Ambulatory Visit: Payer: Self-pay

## 2019-07-01 DIAGNOSIS — E119 Type 2 diabetes mellitus without complications: Secondary | ICD-10-CM | POA: Diagnosis not present

## 2019-07-01 DIAGNOSIS — E785 Hyperlipidemia, unspecified: Secondary | ICD-10-CM | POA: Insufficient documentation

## 2019-07-01 DIAGNOSIS — Z794 Long term (current) use of insulin: Secondary | ICD-10-CM | POA: Diagnosis not present

## 2019-07-01 DIAGNOSIS — N83201 Unspecified ovarian cyst, right side: Secondary | ICD-10-CM | POA: Insufficient documentation

## 2019-07-01 DIAGNOSIS — J45909 Unspecified asthma, uncomplicated: Secondary | ICD-10-CM | POA: Diagnosis not present

## 2019-07-01 DIAGNOSIS — F419 Anxiety disorder, unspecified: Secondary | ICD-10-CM | POA: Diagnosis not present

## 2019-07-01 DIAGNOSIS — N950 Postmenopausal bleeding: Secondary | ICD-10-CM | POA: Diagnosis not present

## 2019-07-01 DIAGNOSIS — N83202 Unspecified ovarian cyst, left side: Secondary | ICD-10-CM | POA: Diagnosis not present

## 2019-07-01 DIAGNOSIS — Z01818 Encounter for other preprocedural examination: Secondary | ICD-10-CM | POA: Insufficient documentation

## 2019-07-01 DIAGNOSIS — Z79899 Other long term (current) drug therapy: Secondary | ICD-10-CM | POA: Insufficient documentation

## 2019-07-01 DIAGNOSIS — I10 Essential (primary) hypertension: Secondary | ICD-10-CM | POA: Insufficient documentation

## 2019-07-01 HISTORY — DX: Anxiety disorder, unspecified: F41.9

## 2019-07-01 LAB — CBC
HCT: 42 % (ref 36.0–46.0)
Hemoglobin: 13.1 g/dL (ref 12.0–15.0)
MCH: 26 pg (ref 26.0–34.0)
MCHC: 31.2 g/dL (ref 30.0–36.0)
MCV: 83.5 fL (ref 80.0–100.0)
Platelets: 301 10*3/uL (ref 150–400)
RBC: 5.03 MIL/uL (ref 3.87–5.11)
RDW: 12.8 % (ref 11.5–15.5)
WBC: 5.4 10*3/uL (ref 4.0–10.5)
nRBC: 0 % (ref 0.0–0.2)

## 2019-07-01 LAB — COMPREHENSIVE METABOLIC PANEL
ALT: 30 U/L (ref 0–44)
AST: 24 U/L (ref 15–41)
Albumin: 4 g/dL (ref 3.5–5.0)
Alkaline Phosphatase: 77 U/L (ref 38–126)
Anion gap: 11 (ref 5–15)
BUN: 13 mg/dL (ref 6–20)
CO2: 27 mmol/L (ref 22–32)
Calcium: 9.7 mg/dL (ref 8.9–10.3)
Chloride: 96 mmol/L — ABNORMAL LOW (ref 98–111)
Creatinine, Ser: 0.47 mg/dL (ref 0.44–1.00)
GFR calc Af Amer: >60 mL/min (ref >60)
GFR calc non Af Amer: >60 mL/min (ref >60)
Glucose, Bld: 429 mg/dL — ABNORMAL HIGH (ref 70–99)
Potassium: 4.3 mmol/L (ref 3.5–5.1)
Sodium: 134 mmol/L — ABNORMAL LOW (ref 135–145)
Total Bilirubin: 1 mg/dL (ref 0.3–1.2)
Total Protein: 8.3 g/dL — ABNORMAL HIGH (ref 6.5–8.1)

## 2019-07-01 LAB — HEMOGLOBIN A1C
Hgb A1c MFr Bld: 13.8 % — ABNORMAL HIGH (ref 4.8–5.6)
Mean Plasma Glucose: 349.36 mg/dL

## 2019-07-01 LAB — GLUCOSE, CAPILLARY: Glucose-Capillary: 391 mg/dL — ABNORMAL HIGH (ref 70–99)

## 2019-07-01 LAB — TYPE AND SCREEN
ABO/RH(D): A POS
Antibody Screen: NEGATIVE

## 2019-07-01 LAB — ABO/RH: ABO/RH(D): A POS

## 2019-07-01 NOTE — Progress Notes (Addendum)
PCP - Caesar Chestnut, NP Endocrinologist- Dr. Elayne Snare Cardiologist - pt denies  Chest x-ray - 12/23/2018 in [facility] EKG - 12/23/2018  Stress Test - denies ECHO - 03/2018 in EP[facility]  Cardiac Cath - denies  Sleep Study - denies CPAP - n/a  Fasting Blood Sugar - 300s  Checks Blood Sugar 2 times a day  Blood Thinner Instructions: n/a Aspirin Instructions: n/a  Anesthesia review: Yes, EKG, Blood glucose 391 with finger stick and 429 on CMP , A1C 13.8, patient reports blood glucose remains high around 300 at all times.  She reports that the lowest her A1C has been is around 8 was when she was on Janumet several years ago (managed by someone out of state) and then her insurance stopped covering medication so she was switched to insulin.  I did advise patient that it is probable our anesthesia department will require a follow up with Dr. Dwyane Dee prior to surgery due to uncontrolled type II diabetes and she verbalized understanding.  I also spoke with Karoline Caldwell PA-C and informed him of patient status and blood glucose.  He is aware and will review chart  Patient denies shortness of breath, fever, cough and chest pain at PAT appointment  Patient verbalized understanding of instructions that were given to them at the PAT appointment. Patient was also instructed that they will need to review over the PAT instructions again at home before surgery.  Coronavirus Screening  Have you experienced the following symptoms:  Cough yes/no: No Fever (>100.62F)  yes/no: No Runny nose yes/no: No Sore throat yes/no: No Difficulty breathing/shortness of breath  yes/no: No  Have you or a family member traveled in the last 14 days and where? yes/no: No  Covid Test scheduled 07/04/2019 per patient  If the patient is not experiencing any of these symptoms, the PAT nurse will instruct them to NOT bring anyone with them to their appointment since they may have these symptoms or traveled as well.   Please remind  your patients and families that hospital visitation restrictions are in effect and the importance of the restrictions.

## 2019-07-02 NOTE — Progress Notes (Addendum)
Anesthesia Chart Review:  Case: [id number] Date/Time: 07/08/19 0700   Procedure: LAPAROSCOPIC ASSISTED VAGINAL HYSTERECTOMY WITH SALPINGO OOPHORECTOMY, possible abdominal hysterectomy (Bilateral ) - possible abdominal hysterectomy pt is diabetic and hypertensive   Anesthesia type: General   Pre-op diagnosis: bilateral ovarian cysts, PMB   Location: [facility] OR ROOM 07 / [place] [facility]   Surgeon: Everlene Farrier, MD      DISCUSSION: Patient is a 53 year old female scheduled for the above procedure.  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 BMI is consistent with obesity.   She was seen by cardiologist Dr. Marlou Porch on 3/19/290 following 12/23/18 ED visit for chest pain and tachycardia. Glucose was > 500. Recent echo unremarkable. He felt symptoms atypical (non-exertional) and did not recommend further cardiac testing unless symptoms become more significant or more worrisome.  Caffeine intake and potentially dehydration related to hyperglycemia thought to be contributing factors of her tachycardia, so less caffeine and better DM control recommended.  PAT CBG 391, 429 on CMET Preoperative A1c 13.8% (by [facility] labs, A1c has previously been 12.1-13.3 since 07/2011). Reported her fasting CBGs are running 300's. She is scheduled to see her endocrinologist Dr. Dwyane Dee on 07/03/19. I spoke with Nira Conn at Dr. Sherlynn Stalls office. We can see what recommendations Dr. Dwyane Dee has, but if patient arrives with fasting glucose > 250 on the day of surgery she will likely be cancelled. Discussed A1c results as well with anesthesiologist Myrtie Soman, MD. Chart will be left for follow-up regarding surgeon and endocrinologist input. If remains as scheduled, she is for pre-surgical COVID-19 test on 07/04/19 and urine pregnancy test on the day of surgery.    ADDENDUM 07/03/19 11:45 AM: Patient was seen by Dr. Dwyane Dee this morning. It sounds like she was not always compliant with her  diabetes regimen due to side effects.  He did adjust her regimen.  He recommended postponing surgery in hopes to get her diabetes better controlled. He will see her back on 07/17/19. Per Nira Conn, Dr. Gertie Fey plans to reschedule surgery for 08/07/19. Patient will need to be rescheduled for her COVID-19 test and get updated labs prior--will update our PAT scheduler.    VS: BP 140/87   Pulse 90   Temp 36.7 C (Temporal)   Resp 18   Ht 5\' 2"  (1.575 m)   Wt 84.8 kg   LMP 05/13/2019 Comment: patient had not had her period in a year and it came back in May and June 2020  SpO2 98%   BMI 34.20 kg/m    PROVIDERS: Lance Sell, NP is PCP  Candee Furbish, MD is cardiologist. Last visit 02/20/19 with PRN follow-up.  Elayne Snare, MD is endocrinologist   LABS: Preoperative labs noted. DM poorly controlled with glucose 429, A1c 13.8% (up from 13.3% on 07/03/18).   (all labs ordered are listed, but only abnormal results are displayed)  Labs Reviewed  GLUCOSE, CAPILLARY - Abnormal; Notable for the following components:      Result Value   Glucose-Capillary 391 (*)    All other components within normal limits  HEMOGLOBIN A1C - Abnormal; Notable for the following components:   Hgb A1c MFr Bld 13.8 (*)    All other components within normal limits  COMPREHENSIVE METABOLIC PANEL - Abnormal; Notable for the following components:   Sodium 134 (*)    Chloride 96 (*)    Glucose, Bld 429 (*)    Total Protein 8.3 (*)    All other components within  normal limits  CBC  TYPE AND SCREEN  ABO/RH     IMAGES: CXR 12/23/18: FINDINGS: The lungs are clear and negative for focal airspace consolidation, pulmonary edema or suspicious pulmonary nodule. No pleural effusion or pneumothorax. Cardiac and mediastinal contours are within normal limits. No acute fracture or lytic or blastic osseous lesions. The visualized upper abdominal bowel gas pattern is unremarkable. IMPRESSION: No active cardiopulmonary  disease.   EKG: 12/23/18: ST at 118 bpm   CV: Echo (limited echo with saline microcavitation study) 04/02/18: Impressions: - Limited study to R/O shunt; LV function is preserved on one   apical image; no effusion; negative saline microcavitation study.    Echo 03/29/18: Study Conclusions - Left ventricle: The cavity size was normal. Wall thickness was   normal. Systolic function was normal. The estimated ejection   fraction was in the range of 55% to 60%. Left ventricular   diastolic function parameters were normal for the patient&'s age. - Aortic valve: Valve area (VTI): 2.93 cm^2. Valve area (Vmax):   3.17 cm^2. Valve area (Vmean): 2.95 cm^2. - Mitral valve: Valve area by pressure half-time: 2.18 cm^2.   Past Medical History:  Diagnosis Date  . Allergy   . 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-I)   . Anxiety   . Asthma   . Diabetes mellitus    type II  . Hyperlipidemia   . Hypertension   . Urticaria, chronic   . UTI (lower urinary tract infection)     Past Surgical History:  Procedure Laterality Date  . breast lift  1993  . CATARACT EXTRACTION, BILATERAL Bilateral 2014  . SOFT TISSUE MASS EXCISION Right 2016   [place] area  . TUBAL LIGATION  1995  . tummy tuck  1993    MEDICATIONS: . amLODipine (NORVASC) 10 MG tablet  . atorvastatin (LIPITOR) 20 MG tablet  . Biotin w/ Vitamins C & E (HAIR SKIN & NAILS GUMMIES PO)  . Cholecalciferol (VITAMIN D) 50 MCG (2000 UT) CAPS  . escitalopram (LEXAPRO) 20 MG tablet  . hydrochlorothiazide (HYDRODIURIL) 12.5 MG tablet  . Insulin Lispro Prot & Lispro (HUMALOG MIX 75/25 KWIKPEN) (75-25) 100 UNIT/ML Kwikpen  . Insulin Pen Needle (BD PEN NEEDLE NANO 2ND GEN) 32G X 4 MM MISC  . Melatonin 5 MG TABS  . montelukast (SINGULAIR) 10 MG tablet  . vitamin B-12 (CYANOCOBALAMIN) 1000 MCG tablet  . zinc gluconate 50 MG tablet   No current facility-administered medications for this encounter.     Myra Gianotti, PA-C Surgical Short Stay/Anesthesiology [facility] Phone [phone] [facility] Phone [phone] 07/02/2019 4:42 PM

## 2019-07-03 ENCOUNTER — Other Ambulatory Visit: Payer: Self-pay

## 2019-07-03 ENCOUNTER — Telehealth: Payer: Self-pay

## 2019-07-03 ENCOUNTER — Encounter: Payer: Self-pay | Admitting: Endocrinology

## 2019-07-03 ENCOUNTER — Ambulatory Visit: Payer: 59 | Admitting: Endocrinology

## 2019-07-03 VITALS — BP 150/78 | HR 100 | Ht 62.0 in | Wt 187.8 lb

## 2019-07-03 DIAGNOSIS — E1165 Type 2 diabetes mellitus with hyperglycemia: Secondary | ICD-10-CM | POA: Diagnosis not present

## 2019-07-03 DIAGNOSIS — Z794 Long term (current) use of insulin: Secondary | ICD-10-CM

## 2019-07-03 MED ORDER — NOVOLOG FLEXPEN 100 UNIT/ML ~~LOC~~ SOPN: PEN_INJECTOR | SUBCUTANEOUS | 3 refills | Status: DC

## 2019-07-03 MED ORDER — CANAGLIFLOZIN 100 MG PO TABS: ORAL_TABLET | ORAL | 3 refills | Status: DC

## 2019-07-03 MED ORDER — INSULIN LISPRO (1 UNIT DIAL) 100 UNIT/ML (KWIKPEN): PEN_INJECTOR | SUBCUTANEOUS | 1 refills | Status: DC

## 2019-07-03 MED ORDER — BASAGLAR KWIKPEN 100 UNIT/ML ~~LOC~~ SOPN: 60.0000 [IU] | PEN_INJECTOR | Freq: Every day | SUBCUTANEOUS | 0 refills | Status: DC

## 2019-07-03 NOTE — Progress Notes (Signed)
Patient ID: Holly Ortiz, female   DOB: Apr 04, 1966, 53 y.o.   MRN: [id number]          Reason for Appointment: Follow-up for Type 2 Diabetes     History of Present Illness:          Date of diagnosis of type 2 diabetes mellitus:   At age 35      Background history:  Her diabetes was diagnosed incidentally when she was having unrelated problems She took various oral agents including metformin for several years About 5 years ago she was started on insulin and she apparently was taking only one kind of insulin possibly a premixed insulin twice a day She said that her blood sugars have been usually poorly controlled with A1c usually 8-9% She was also taking metformin, Amaryl and Januvia prior to moving here from [place]  Recent history:   She has not been seen in follow-up since her initial consultation in 08/2018  INSULIN regimen is: Humalog Mix 75/25, 25-30 units before breakfast and dinner     Non-insulin hypoglycemic drugs the patient is taking are: None  Current management, blood sugar patterns and problems identified:  Her A1c is 13.8  She has come back for follow-up because of needing clearance for surgery next week  She does not think her blood sugars have been doing well and much higher in the last couple of weeks also  However she thinks that despite blood sugars being 500 at times she does not feel weak or tired  Last year she was given metformin and Trulicity but she thinks that metformin caused nausea and vomiting and she did not start Trulicity for unknown reasons  She was also supposed to use [place] and Humalog instead of the Novolin 70/30 she was taking but she claims that [place] caused hives and itching and she did not let us know about this  Since 09/2018 she has been on Humalog mix twice a day  She checks her blood sugars only before breakfast and dinner and not after meals  Did not bring her blood sugar meter or any records, she was also given a One  Touch meter last year but she has not used it still using the [employer] meter  She thinks her blood sugars are higher in the morning than in the evening  Fasting lab glucose 2 days ago was 429        Side effects from medications have been: None  Compliance with the medical regimen: Fair   Typical meal intake: Breakfast is  Bagel or egg. Rice, sweets.              Exercise: None   Glucose monitoring:  done 1-2 times a day         Glucometer: [employer] brand       Blood Glucose readings by recall: As above  Dietician visit, most recent: A few years ago in [place]  Weight history:  Wt Readings from Last 3 Encounters:  07/03/19 187 lb 12.8 oz (85.2 kg)  07/01/19 187 lb (84.8 kg)  02/20/19 185 lb 9.6 oz (84.2 kg)    Glycemic control:   Lab Results  Component Value Date   HGBA1C 13.8 (H) 07/01/2019   HGBA1C 13.3 (H) 07/03/2018   HGBA1C 12.1 (H) 01/04/2018   Lab Results  Component Value Date   LDLCALC 70 01/18/2018   CREATININE 0.47 07/01/2019   No results found for: MICRALBCREAT  No results found for: FRUCTOSAMINE  Hospital Outpatient  Visit on 07/01/2019  Component Date Value Ref Range Status  . Glucose-Capillary 07/01/2019 391* 70 - 99 mg/dL Final  . Hgb A1c MFr Bld 07/01/2019 13.8* 4.8 - 5.6 % Final   Comment: (NOTE) Pre diabetes:          5.7%-6.4% Diabetes:              >6.4% Glycemic control for   <7.0% adults with diabetes   . Mean Plasma Glucose 07/01/2019 349.36  mg/dL Final   Performed at [facility] [address], [place], [place] [address]  . WBC 07/01/2019 5.4  4.0 - 10.5 K/uL Final  . RBC 07/01/2019 5.03  3.87 - 5.11 MIL/uL Final  . Hemoglobin 07/01/2019 13.1  12.0 - 15.0 g/dL Final  . HCT 07/01/2019 42.0  36.0 - 46.0 % Final  . MCV 07/01/2019 83.5  80.0 - 100.0 fL Final  . MCH 07/01/2019 26.0  26.0 - 34.0 pg Final  . MCHC 07/01/2019 31.2  30.0 - 36.0 g/dL Final  . RDW 07/01/2019 12.8  11.5 - 15.5 % Final  . Platelets 07/01/2019 301   150 - 400 K/uL Final  . nRBC 07/01/2019 0.0  0.0 - 0.2 % Final   Performed at [facility], [place] [address], [place], [place] [address]  . Sodium 07/01/2019 134* 135 - 145 mmol/L Final  . Potassium 07/01/2019 4.3  3.5 - 5.1 mmol/L Final  . Chloride 07/01/2019 96* 98 - 111 mmol/L Final  . CO2 07/01/2019 27  22 - 32 mmol/L Final  . Glucose, Bld 07/01/2019 429* 70 - 99 mg/dL Final  . BUN 07/01/2019 13  6 - 20 mg/dL Final  . Creatinine, Ser 07/01/2019 0.47  0.44 - 1.00 mg/dL Final  . Calcium 07/01/2019 9.7  8.9 - 10.3 mg/dL Final  . Total Protein 07/01/2019 8.3* 6.5 - 8.1 g/dL Final  . Albumin 07/01/2019 4.0  3.5 - 5.0 g/dL Final  . AST 07/01/2019 24  15 - 41 U/L Final  . ALT 07/01/2019 30  0 - 44 U/L Final  . Alkaline Phosphatase 07/01/2019 77  38 - 126 U/L Final  . Total Bilirubin 07/01/2019 1.0  0.3 - 1.2 mg/dL Final  . GFR calc non Af Amer 07/01/2019 >60  >60 mL/min Final  . GFR calc Af Amer 07/01/2019 >60  >60 mL/min Final  . Anion gap 07/01/2019 11  5 - 15 Final   Performed at [facility], [place] [address] [place], [place] [address]  . ABO/RH(D) 07/01/2019 A POS   Final  . Antibody Screen 07/01/2019 NEG   Final  . Sample Expiration 07/01/2019 07/15/2019,2359   Final  . Extend sample reason 07/01/2019    Final                   Value:NO TRANSFUSIONS OR PREGNANCY IN THE PAST 3 MONTHS Performed at [facility], [place] [address] [place], [place] [address]   . ABO/RH(D) 07/01/2019    Final                   Value:A POS Performed at [facility], [place] [address] [place], [place] [address]     Allergies as of 07/03/2019      Reactions   Actos [pioglitazone Hydrochloride] Other (See Comments)   Wt gain   Lisinopril    Angioedema, including likely GI involvement   Metformin And Related Diarrhea, Nausea And Vomiting  Medication List       Accurate as of July 03, 2019 10:23 AM. If you have any questions, ask your nurse or doctor.        STOP taking  these medications   HAIR SKIN & NAILS GUMMIES PO Stopped by: Elayne Snare, MD   Insulin Lispro Prot & Lispro (75-25) 100 UNIT/ML Kwikpen Commonly known as: HumaLOG Mix 75/25 KwikPen Stopped by: Elayne Snare, MD     TAKE these medications   amLODipine 10 MG tablet Commonly known as: NORVASC Take 1 tablet (10 mg total) by mouth daily.   atorvastatin 20 MG tablet Commonly known as: Lipitor Take 1 tablet (20 mg total) by mouth daily.   Basaglar KwikPen 100 UNIT/ML Sopn Inject 0.6 mLs (60 Units total) into the skin daily. Started by: Elayne Snare, MD   canagliflozin 100 MG Tabs tablet Commonly known as: Invokana 1 tablet before breakfast Started by: Elayne Snare, MD   escitalopram 20 MG tablet Commonly known as: LEXAPRO Take 1 tablet (20 mg total) by mouth daily.   hydrochlorothiazide 12.5 MG tablet Commonly known as: HYDRODIURIL Take 1 tablet (12.5 mg total) by mouth daily. **NEED TO ESTABLISH WITH NEW PROVIDER**   insulin lispro 100 UNIT/ML KwikPen Commonly known as: HumaLOG KwikPen 20 units before each meal Started by: Elayne Snare, MD   Insulin Pen Needle 32G X 4 MM Misc Commonly known as: BD Pen Needle Nano 2nd Gen Twice a day   Melatonin 5 MG Tabs Take 5 mg by mouth at bedtime.   montelukast 10 MG tablet Commonly known as: SINGULAIR Take 1 tablet (10 mg total) by mouth at bedtime.   vitamin B-12 1000 MCG tablet Commonly known as: CYANOCOBALAMIN Take 1,000 mcg by mouth daily.   Vitamin D 50 MCG (2000 UT) Caps Take 2,000 Units by mouth daily.   zinc gluconate 50 MG tablet Take 50 mg by mouth daily.       Allergies:  Allergies  Allergen Reactions  . Actos [Pioglitazone Hydrochloride] Other (See Comments)    Wt gain  . Lisinopril     Angioedema, including likely GI involvement  . Metformin And Related Diarrhea and Nausea And Vomiting    Past Medical History:  Diagnosis Date  . Allergy   . 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-I)    . Anxiety   . Asthma   . Diabetes mellitus    type II  . Hyperlipidemia   . Hypertension   . Urticaria, chronic   . UTI (lower urinary tract infection)     Past Surgical History:  Procedure Laterality Date  . breast lift  1993  . CATARACT EXTRACTION, BILATERAL Bilateral 2014  . SOFT TISSUE MASS EXCISION Right 2016   [place] area  . TUBAL LIGATION  1995  . tummy tuck  1993    Family History  Problem Relation Age of Onset  . Hyperlipidemia Other   . Heart disease Other   . Diabetes Father   . Hypertension Father   . COPD Father        Cause of death  . Colon cancer Mother   . Hypertension Mother   . Hypertension Sister   . Diabetes Sister   . Hypertension Brother   . Cancer Sister        Brain glioblastoma  . Diabetes Sister     Social History:  reports that she has never smoked. She has never used smokeless tobacco. She reports that she does not drink alcohol  or use drugs.   Review of Systems   Lipid history: On Lipitor 20 mg from her PCP but the last LDL was high despite taking medication consistently    Lab Results  Component Value Date   CHOL 219 (H) 07/03/2018   HDL 38.80 (L) 07/03/2018   LDLCALC 70 01/18/2018   LDLDIRECT 129.0 07/03/2018   TRIG 351.0 (H) 07/03/2018   CHOLHDL 6 07/03/2018           Hypertension: Has been present since age 27, currently on amlodipine and HCTZ, may have had angioedema with lisinopril Prescribed by PCP  BP Readings from Last 3 Encounters:  07/03/19 (!) 150/78  07/01/19 140/87  02/20/19 138/80    Most recent eye exam was in 2017  Most recent foot exam: 9/19  Currently known complications of diabetes: Microalbuminuria as of 1/19  LABS:  Hospital Outpatient Visit on 07/01/2019  Component Date Value Ref Range Status  . Glucose-Capillary 07/01/2019 391* 70 - 99 mg/dL Final  . Hgb A1c MFr Bld 07/01/2019 13.8* 4.8 - 5.6 % Final   Comment: (NOTE) Pre diabetes:          5.7%-6.4% Diabetes:              >6.4%  Glycemic control for   <7.0% adults with diabetes   . Mean Plasma Glucose 07/01/2019 349.36  mg/dL Final   Performed at [facility] [address], [place], [place] [address]  . WBC 07/01/2019 5.4  4.0 - 10.5 K/uL Final  . RBC 07/01/2019 5.03  3.87 - 5.11 MIL/uL Final  . Hemoglobin 07/01/2019 13.1  12.0 - 15.0 g/dL Final  . HCT 07/01/2019 42.0  36.0 - 46.0 % Final  . MCV 07/01/2019 83.5  80.0 - 100.0 fL Final  . MCH 07/01/2019 26.0  26.0 - 34.0 pg Final  . MCHC 07/01/2019 31.2  30.0 - 36.0 g/dL Final  . RDW 07/01/2019 12.8  11.5 - 15.5 % Final  . Platelets 07/01/2019 301  150 - 400 K/uL Final  . nRBC 07/01/2019 0.0  0.0 - 0.2 % Final   Performed at [facility], [place] [address], [place], [place] [address]  . Sodium 07/01/2019 134* 135 - 145 mmol/L Final  . Potassium 07/01/2019 4.3  3.5 - 5.1 mmol/L Final  . Chloride 07/01/2019 96* 98 - 111 mmol/L Final  . CO2 07/01/2019 27  22 - 32 mmol/L Final  . Glucose, Bld 07/01/2019 429* 70 - 99 mg/dL Final  . BUN 07/01/2019 13  6 - 20 mg/dL Final  . Creatinine, Ser 07/01/2019 0.47  0.44 - 1.00 mg/dL Final  . Calcium 07/01/2019 9.7  8.9 - 10.3 mg/dL Final  . Total Protein 07/01/2019 8.3* 6.5 - 8.1 g/dL Final  . Albumin 07/01/2019 4.0  3.5 - 5.0 g/dL Final  . AST 07/01/2019 24  15 - 41 U/L Final  . ALT 07/01/2019 30  0 - 44 U/L Final  . Alkaline Phosphatase 07/01/2019 77  38 - 126 U/L Final  . Total Bilirubin 07/01/2019 1.0  0.3 - 1.2 mg/dL Final  . GFR calc non Af Amer 07/01/2019 >60  >60 mL/min Final  . GFR calc Af Amer 07/01/2019 >60  >60 mL/min Final  . Anion gap 07/01/2019 11  5 - 15 Final   Performed at [facility], [place] [address] [place], [place] [address]  . ABO/RH(D) 07/01/2019 A POS   Final  . Antibody Screen 07/01/2019 NEG   Final  .  Sample Expiration 07/01/2019 07/15/2019,2359   Final  . Extend sample reason 07/01/2019    Final                   Value:NO TRANSFUSIONS OR PREGNANCY IN THE PAST 3 MONTHS  Performed at [facility], [address] [place], [place] [address]   . ABO/RH(D) 07/01/2019    Final                   Value:A POS Performed at [facility], [place] [address] [place], [place] [address]     Physical Examination:  BP (!) 150/78 (BP Location: Left Arm, Patient Position: Sitting, Cuff Size: Normal)   Pulse 100   Ht 5\' 2"  (1.575 m)   Wt 187 lb 12.8 oz (85.2 kg)   SpO2 98%   BMI 34.35 kg/m         ASSESSMENT:  Diabetes type 2, poorly controlled with persistent hyperglycemia  See history of present illness for detailed discussion of current diabetes management, blood sugar patterns and problems identified  A1c has been mostly between around 13%  She has had poor control and and also quite noncompliant with follow-up  Although she previously reported better control with metformin she did not continue this last year because of her having some nausea issues, not clear if this was metformin related  Currently with taking an average of about 50 units of insulin a day her blood sugars are still markedly increased  Since she has tendency to weight gain with insulin she may be a good candidate for adding an SGLT2 drug Apparently previously had tried Invokana with benefit  He can do better when exercising also  Not checking blood sugars after meals again Currently using a generic monitor that cannot be downloaded even though she has her One Touch meter at home   Complications of diabetes: Microalbuminuria, needs follow-up eye exam  HYPERLIPIDEMIA: Not controlled as of 7/19, currently followed by PCP on Lipitor  PLAN:   Discussed with the patient the nature of GLP-1 drugs, the action on various organ systems, improved satiety, how they benefit blood glucose control, as well as the benefit of weight loss. Explained possible side effects of TRULICITY, most commonly nausea that usually improves over time; discussed safety information in package insert.   Demonstrated the medication injection device and injection technique to the patient.  Showed patient possible injection sites To start with 0.25 mg dosage weekly for the first 4 injections and then increase dose up to 0.5 mg INSULIN: She will switch her 70/30 insulin to the insurance preferred agents BASAGLAR and Humalog.  Toujeo will be performed but this is not covered Today discussed in detail the need for mealtime insulin to cover postprandial spikes, action of mealtime insulin, use of the insulin pen, timing and action of the rapid acting insulin as well as starting dose and dosage titration to target the two-hour reading of under 180  She will start with an average of 20 units HUMALOG before each meal with 5 units more for larger meals and 15 only for smaller meals  She will try to adjust her insulin to keep her blood sugars from going up at least to over 200 after meals and may reduce the dose if sugars are running low normal after meals  Consider insulin pump some point for better control since she is likely going to require large doses of insulin  Also start INVOKANA 100 mg daily Discussed  action of SGLT 2 drugs on lowering glucose by decreasing kidney absorption of glucose, benefits of weight loss and lower blood pressure, possible side effects including candidiasis and dosage regimen   Will need to monitor her blood pressure and renal function with starting this  Prior authorization may have to be done since her insurance prefers [place] but patient feels that this did not agree with her and she had issues with bladder infection with this  With her having marked hyperglycemia she will need to postpone her upcoming hysterectomy by a couple of weeks at least and will have her follow-up in 2 weeks to reassess her level of control May need diabetes educator to see her also   Patient Instructions  Check blood sugars on waking up 7 days a week  Also check blood sugars about 2 hours  after meals and do this after different meals by rotation  Recommended blood sugar levels on waking up are 90-130 and about 2 hours after meal is 130-160  Please bring your blood sugar monitor to each visit, thank you  BASAGLAR insulin: This insulin provides blood sugar control for up to 24 hours with once a day injection.    Start with 40 units at bedtime daily and increase by 4 units every 3 days until the waking up sugars are under 130. Then continue the same dose.  If blood sugar is under 90 for 2 days in a row, reduce the dose by 4 units.  Note that this insulin does not control the rise of blood sugar with meals    HUMALOG insulin: This needs to be taken before each meal:  Take 15 units for small meals and 20 units more regular size meals and 25 units if eating more carbohydrate or higher fat meal  Walk regularly for exercise  Take Invokana in the morning, make sure you are drinking plenty of water    Notes have been forwarded to her gynecologist  Elayne Snare 07/03/2019, 10:23 AM   Note: This office note was prepared with Dragon voice recognition system technology. Any transcriptional errors that result from this process are unintentional.  Counseling time on subjects discussed in assessment and plan sections is over 50% of today's 30 minute visit

## 2019-07-03 NOTE — Telephone Encounter (Signed)
PA initiated via [email or web address] for Invokana 100mg  tablets taking once daily. [name] (Key: [id number])  Your information has been submitted to [facility]. To check for an updated outcome later, reopen this PA request from your dashboard.  If [employer] has not responded to your request within 24 hours, contact [facility] at [phone]. If you think there may be a problem with your PA request, use our live chat feature at the bottom right. Key: [id number] - PA Case ID: [id number] Need help? Call us at [phone] Status Sent to [place] 100MG  tablets Form [employer] Electronic PA Form (NCPDP)

## 2019-07-03 NOTE — Patient Instructions (Signed)
Check blood sugars on waking up 7 days a week  Also check blood sugars about 2 hours after meals and do this after different meals by rotation  Recommended blood sugar levels on waking up are 90-130 and about 2 hours after meal is 130-160  Please bring your blood sugar monitor to each visit, thank you  BASAGLAR insulin: This insulin provides blood sugar control for up to 24 hours with once a day injection.    Start with 40 units at bedtime daily and increase by 4 units every 3 days until the waking up sugars are under 130. Then continue the same dose.  If blood sugar is under 90 for 2 days in a row, reduce the dose by 4 units.  Note that this insulin does not control the rise of blood sugar with meals    HUMALOG insulin: This needs to be taken before each meal:  Take 15 units for small meals and 20 units more regular size meals and 25 units if eating more carbohydrate or higher fat meal  Walk regularly for exercise  Take Invokana in the morning, make sure you are drinking plenty of water

## 2019-07-04 ENCOUNTER — Other Ambulatory Visit (HOSPITAL_COMMUNITY): Payer: 59

## 2019-07-04 ENCOUNTER — Telehealth: Payer: Self-pay | Admitting: Endocrinology

## 2019-07-04 ENCOUNTER — Other Ambulatory Visit: Payer: Self-pay

## 2019-07-04 MED ORDER — LANTUS SOLOSTAR 100 UNIT/ML ~~LOC~~ SOPN: 60.0000 [IU] | PEN_INJECTOR | Freq: Every day | SUBCUTANEOUS | 3 refills | Status: DC

## 2019-07-04 NOTE — Telephone Encounter (Signed)
Pt states that the two medications prescribed 07/03/2019 are not covered by insurance, Invokana and Basaglar  Pt does have Invokana 30 day free supply but once that runs out she can not afford to purchase.   Please advise.

## 2019-07-04 NOTE — Telephone Encounter (Signed)
Called pt and informed her that [place] can be changed to Lantus as it is the same medication, and she will keep the same dosage of 60 units QD. Also informed her that Anastasio Auerbach was in the review process by her insurance, as a PA was initiated yesterday. Pt verbalized understanding of this.

## 2019-07-07 ENCOUNTER — Other Ambulatory Visit: Payer: Self-pay

## 2019-07-07 MED ORDER — BASAGLAR KWIKPEN 100 UNIT/ML ~~LOC~~ SOPN: 60.0000 [IU] | PEN_INJECTOR | Freq: Every day | SUBCUTANEOUS | 3 refills | Status: DC

## 2019-07-07 MED ORDER — NOVOLOG FLEXPEN 100 UNIT/ML ~~LOC~~ SOPN: PEN_INJECTOR | SUBCUTANEOUS | 3 refills | Status: DC

## 2019-07-08 NOTE — Telephone Encounter (Signed)
Received fax from [place] stating that pt has been approved for Invokana 100mg  tablets. Coverage good from 07/03/2019 through 07/02/2020.

## 2019-07-17 ENCOUNTER — Other Ambulatory Visit: Payer: Self-pay

## 2019-07-17 ENCOUNTER — Encounter: Payer: Self-pay | Admitting: Endocrinology

## 2019-07-17 ENCOUNTER — Encounter: Payer: 59 | Admitting: Endocrinology

## 2019-07-18 NOTE — Progress Notes (Signed)
This encounter was created in error - please disregard.

## 2019-07-21 ENCOUNTER — Encounter: Payer: Self-pay | Admitting: Endocrinology

## 2019-07-21 ENCOUNTER — Ambulatory Visit: Payer: 59 | Admitting: Endocrinology

## 2019-07-21 ENCOUNTER — Other Ambulatory Visit: Payer: Self-pay

## 2019-07-21 VITALS — BP 140/76 | HR 99 | Ht 62.0 in | Wt 189.8 lb

## 2019-07-21 DIAGNOSIS — Z794 Long term (current) use of insulin: Secondary | ICD-10-CM | POA: Diagnosis not present

## 2019-07-21 DIAGNOSIS — E782 Mixed hyperlipidemia: Secondary | ICD-10-CM

## 2019-07-21 DIAGNOSIS — E1165 Type 2 diabetes mellitus with hyperglycemia: Secondary | ICD-10-CM

## 2019-07-21 LAB — LIPID PANEL
Cholesterol: 138 mg/dL (ref 0–200)
HDL: 35.9 mg/dL — ABNORMAL LOW (ref >39.00)
LDL Cholesterol: 71 mg/dL (ref 0–99)
NonHDL: 102.16
Total CHOL/HDL Ratio: 4
Triglycerides: 154 mg/dL — ABNORMAL HIGH (ref 0.0–149.0)
VLDL: 30.8 mg/dL (ref 0.0–40.0)

## 2019-07-21 LAB — BASIC METABOLIC PANEL
BUN: 15 mg/dL (ref 6–23)
CO2: 30 mEq/L (ref 19–32)
Calcium: 10.1 mg/dL (ref 8.4–10.5)
Chloride: 98 mEq/L (ref 96–112)
Creatinine, Ser: 0.54 mg/dL (ref 0.40–1.20)
GFR: 118.31 mL/min (ref >60.00)
Glucose, Bld: 128 mg/dL — ABNORMAL HIGH (ref 70–99)
Potassium: 4 mEq/L (ref 3.5–5.1)
Sodium: 138 mEq/L (ref 135–145)

## 2019-07-21 NOTE — Patient Instructions (Addendum)
Check blood sugars on waking up 5 days a week  Also check blood sugars about 2 hours after meals and do this after different meals by rotation  Recommended blood sugar levels on waking up are 90-130 and about 2 hours after meal is 130-180  Please bring your blood sugar monitor to each visit, thank you  Walk daily  Dinner Novolog 22 units Am Novolog 18 in am  Basaglar 64 units  Call [place] about meter

## 2019-07-21 NOTE — Progress Notes (Signed)
Patient ID: Holly Ortiz, female   DOB: 1966-05-03, 53 y.o.   MRN: [id number]          Reason for Appointment: Follow-up for Type 2 Diabetes     History of Present Illness:          Date of diagnosis of type 2 diabetes mellitus:   At age 51      Background history:  Her diabetes was diagnosed incidentally when she was having unrelated problems She took various oral agents including metformin for several years About 5 years ago she was started on insulin and she apparently was taking only one kind of insulin possibly a premixed insulin twice a day She said that her blood sugars have been usually poorly controlled with A1c usually 8-9% She was also taking metformin, Amaryl and Januvia prior to moving here from [place]  Recent history:    INSULIN regimen is: Basaglar 60 units daily, Humalog 20 units before meals   Non-insulin hypoglycemic drugs the patient is taking are: Invokana 100 mg daily  Current management, blood sugar patterns and problems identified:  Her A1c was 13.8 in July  She has come back for short-term follow-up after starting Invokana  Also her INSULIN was changed on premixed insulin to mealtime and basal insulin separately  Although her blood sugars are fluctuating somewhat they are overall much better  FASTING readings are tending to the highest but has been as low as 129  On her own she has done much better with her diet and is eating a very low carbohydrate diet  With this she is mostly eating salads with some protein especially at dinnertime  At breakfast only eating eggs without any bread  She has however not exercise because of being tired in the evening  He has no excessive frequency of urination or yeast infection with INVOKANA  However has not checked readings after her meals regularly except once or twice not clear if she is having high readings after meals  No objective labs available to assess her actual improvement  She was told to  check on her brand-name meter coverage from the insurance kidney but she has not had a still using the [employer] meter  Her weight is almost the same        Side effects from medications have been: Hives from [place]  Compliance with the medical regimen: Improved      Exercise: None   Glucose monitoring:  done 1-3 times a day         Glucometer: [employer] brand       Blood Glucose readings from patient log:   PRE-MEAL Fasting Lunch Dinner Bedtime Overall  Glucose range:  129-271  108-142  133-154    Mean/median:     ?   POST-MEAL PC Breakfast PC Lunch PC Dinner  Glucose range:   233  205  Mean/median:        Dietician visit, most recent: A few years ago in [place]  Weight history:  Wt Readings from Last 3 Encounters:  07/21/19 189 lb 12.8 oz (86.1 kg)  07/03/19 187 lb 12.8 oz (85.2 kg)  07/01/19 187 lb (84.8 kg)    Glycemic control:   Lab Results  Component Value Date   HGBA1C 13.8 (H) 07/01/2019   HGBA1C 13.3 (H) 07/03/2018   HGBA1C 12.1 (H) 01/04/2018   Lab Results  Component Value Date   LDLCALC 70 01/18/2018   CREATININE 0.47 07/01/2019   No results found for: MICRALBCREAT  No results found for: FRUCTOSAMINE  No visits with results within 1 Week(s) from this visit.  Latest known visit with results is:  Hospital Outpatient Visit on 07/01/2019  Component Date Value Ref Range Status  . Glucose-Capillary 07/01/2019 391* 70 - 99 mg/dL Final  . Hgb A1c MFr Bld 07/01/2019 13.8* 4.8 - 5.6 % Final   Comment: (NOTE) Pre diabetes:          5.7%-6.4% Diabetes:              >6.4% Glycemic control for   <7.0% adults with diabetes   . Mean Plasma Glucose 07/01/2019 349.36  mg/dL Final   Performed at [facility] [address], [place], [place] [address]  . WBC 07/01/2019 5.4  4.0 - 10.5 K/uL Final  . RBC 07/01/2019 5.03  3.87 - 5.11 MIL/uL Final  . Hemoglobin 07/01/2019 13.1  12.0 - 15.0 g/dL Final  . HCT 07/01/2019 42.0  36.0 - 46.0 % Final  . MCV  07/01/2019 83.5  80.0 - 100.0 fL Final  . MCH 07/01/2019 26.0  26.0 - 34.0 pg Final  . MCHC 07/01/2019 31.2  30.0 - 36.0 g/dL Final  . RDW 07/01/2019 12.8  11.5 - 15.5 % Final  . Platelets 07/01/2019 301  150 - 400 K/uL Final  . nRBC 07/01/2019 0.0  0.0 - 0.2 % Final   Performed at [facility], [place] [address] [place], [place] [address]  . Sodium 07/01/2019 134* 135 - 145 mmol/L Final  . Potassium 07/01/2019 4.3  3.5 - 5.1 mmol/L Final  . Chloride 07/01/2019 96* 98 - 111 mmol/L Final  . CO2 07/01/2019 27  22 - 32 mmol/L Final  . Glucose, Bld 07/01/2019 429* 70 - 99 mg/dL Final  . BUN 07/01/2019 13  6 - 20 mg/dL Final  . Creatinine, Ser 07/01/2019 0.47  0.44 - 1.00 mg/dL Final  . Calcium 07/01/2019 9.7  8.9 - 10.3 mg/dL Final  . Total Protein 07/01/2019 8.3* 6.5 - 8.1 g/dL Final  . Albumin 07/01/2019 4.0  3.5 - 5.0 g/dL Final  . AST 07/01/2019 24  15 - 41 U/L Final  . ALT 07/01/2019 30  0 - 44 U/L Final  . Alkaline Phosphatase 07/01/2019 77  38 - 126 U/L Final  . Total Bilirubin 07/01/2019 1.0  0.3 - 1.2 mg/dL Final  . GFR calc non Af Amer 07/01/2019 >60  >60 mL/min Final  . GFR calc Af Amer 07/01/2019 >60  >60 mL/min Final  . Anion gap 07/01/2019 11  5 - 15 Final   Performed at [facility], [place] [address] [place], [place] [address]  . ABO/RH(D) 07/01/2019 A POS   Final  . Antibody Screen 07/01/2019 NEG   Final  . Sample Expiration 07/01/2019 07/15/2019,2359   Final  . Extend sample reason 07/01/2019    Final                   Value:NO TRANSFUSIONS OR PREGNANCY IN THE PAST 3 MONTHS Performed at [facility], [place] [address], [place], [place] [address]   . ABO/RH(D) 07/01/2019    Final                   Value:A POS Performed at [facility], [place] [address], [place], [place] [address]     Allergies as of 07/21/2019      Reactions   Actos [pioglitazone Hydrochloride] Other (See Comments)  Wt gain   Lisinopril    Angioedema, including likely GI  involvement   Metformin And Related Diarrhea, Nausea And Vomiting      Medication List       Accurate as of July 21, 2019  1:21 PM. If you have any questions, ask your nurse or doctor.        amLODipine 10 MG tablet Commonly known as: NORVASC Take 1 tablet (10 mg total) by mouth daily.   atorvastatin 20 MG tablet Commonly known as: Lipitor Take 1 tablet (20 mg total) by mouth daily.   Basaglar KwikPen 100 UNIT/ML Sopn Inject 0.6 mLs (60 Units total) into the skin daily. Inject 60 units under the skin once daily.   canagliflozin 100 MG Tabs tablet Commonly known as: Invokana 1 tablet before breakfast   escitalopram 20 MG tablet Commonly known as: LEXAPRO Take 1 tablet (20 mg total) by mouth daily.   hydrochlorothiazide 12.5 MG tablet Commonly known as: HYDRODIURIL Take 1 tablet (12.5 mg total) by mouth daily. **NEED TO ESTABLISH WITH NEW PROVIDER**   Insulin Pen Needle 32G X 4 MM Misc Commonly known as: BD Pen Needle Nano 2nd Gen Twice a day   Melatonin 5 MG Tabs Take 5 mg by mouth at bedtime.   montelukast 10 MG tablet Commonly known as: SINGULAIR Take 1 tablet (10 mg total) by mouth at bedtime.   NovoLOG FlexPen 100 UNIT/ML FlexPen Generic drug: insulin aspart Inject 20 units under the skin three times daily before meals.   vitamin B-12 1000 MCG tablet Commonly known as: CYANOCOBALAMIN Take 1,000 mcg by mouth daily.   Vitamin D 50 MCG (2000 UT) Caps Take 2,000 Units by mouth daily.   zinc gluconate 50 MG tablet Take 50 mg by mouth daily.       Allergies:  Allergies  Allergen Reactions  . Actos [Pioglitazone Hydrochloride] Other (See Comments)    Wt gain  . Lisinopril     Angioedema, including likely GI involvement  . Metformin And Related Diarrhea and Nausea And Vomiting    Past Medical History:  Diagnosis Date  . Allergy   . 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-I)   . Anxiety   . Asthma   . Diabetes mellitus     type II  . Hyperlipidemia   . Hypertension   . Urticaria, chronic   . UTI (lower urinary tract infection)     Past Surgical History:  Procedure Laterality Date  . breast lift  1993  . CATARACT EXTRACTION, BILATERAL Bilateral 2014  . SOFT TISSUE MASS EXCISION Right 2016   [place]  . TUBAL LIGATION  1995  . tummy tuck  1993    Family History  Problem Relation Age of Onset  . Hyperlipidemia Other   . Heart disease Other   . Diabetes Father   . Hypertension Father   . COPD Father        Cause of death  . Colon cancer Mother   . Hypertension Mother   . Hypertension Sister   . Diabetes Sister   . Hypertension Brother   . Cancer Sister        Brain glioblastoma  . Diabetes Sister     Social History:  reports that she has never smoked. She has never used smokeless tobacco. She reports that she does not drink alcohol or use drugs.   Review of Systems   Lipid history: On Lipitor 20 mg from her PCP but the last LDL  was high She thinks she is taking this regularly now and needs follow-up    Lab Results  Component Value Date   CHOL 219 (H) 07/03/2018   HDL 38.80 (L) 07/03/2018   LDLCALC 70 01/18/2018   LDLDIRECT 129.0 07/03/2018   TRIG 351.0 (H) 07/03/2018   CHOLHDL 6 07/03/2018           Hypertension: Has been present since age 65, currently on amlodipine and HCTZ 12.5mg . Her doses were not changed with starting Invokana  May have had angioedema with lisinopril Prescribed by PCP  BP Readings from Last 3 Encounters:  07/21/19 140/76  07/03/19 (!) 150/78  07/01/19 140/87    Most recent eye exam was in 2017  Most recent foot exam: 9/19  Currently known complications of diabetes: Microalbuminuria as of 1/19  LABS:  No visits with results within 1 Week(s) from this visit.  Latest known visit with results is:  Hospital Outpatient Visit on 07/01/2019  Component Date Value Ref Range Status  . Glucose-Capillary 07/01/2019 391* 70 - 99 mg/dL Final  .  Hgb A1c MFr Bld 07/01/2019 13.8* 4.8 - 5.6 % Final   Comment: (NOTE) Pre diabetes:          5.7%-6.4% Diabetes:              >6.4% Glycemic control for   <7.0% adults with diabetes   . Mean Plasma Glucose 07/01/2019 349.36  mg/dL Final   Performed at [facility] [address], [place], [place] [address]  . WBC 07/01/2019 5.4  4.0 - 10.5 K/uL Final  . RBC 07/01/2019 5.03  3.87 - 5.11 MIL/uL Final  . Hemoglobin 07/01/2019 13.1  12.0 - 15.0 g/dL Final  . HCT 07/01/2019 42.0  36.0 - 46.0 % Final  . MCV 07/01/2019 83.5  80.0 - 100.0 fL Final  . MCH 07/01/2019 26.0  26.0 - 34.0 pg Final  . MCHC 07/01/2019 31.2  30.0 - 36.0 g/dL Final  . RDW 07/01/2019 12.8  11.5 - 15.5 % Final  . Platelets 07/01/2019 301  150 - 400 K/uL Final  . nRBC 07/01/2019 0.0  0.0 - 0.2 % Final   Performed at [facility], [place] [address], [place], [place] [address]  . Sodium 07/01/2019 134* 135 - 145 mmol/L Final  . Potassium 07/01/2019 4.3  3.5 - 5.1 mmol/L Final  . Chloride 07/01/2019 96* 98 - 111 mmol/L Final  . CO2 07/01/2019 27  22 - 32 mmol/L Final  . Glucose, Bld 07/01/2019 429* 70 - 99 mg/dL Final  . BUN 07/01/2019 13  6 - 20 mg/dL Final  . Creatinine, Ser 07/01/2019 0.47  0.44 - 1.00 mg/dL Final  . Calcium 07/01/2019 9.7  8.9 - 10.3 mg/dL Final  . Total Protein 07/01/2019 8.3* 6.5 - 8.1 g/dL Final  . Albumin 07/01/2019 4.0  3.5 - 5.0 g/dL Final  . AST 07/01/2019 24  15 - 41 U/L Final  . ALT 07/01/2019 30  0 - 44 U/L Final  . Alkaline Phosphatase 07/01/2019 77  38 - 126 U/L Final  . Total Bilirubin 07/01/2019 1.0  0.3 - 1.2 mg/dL Final  . GFR calc non Af Amer 07/01/2019 >60  >60 mL/min Final  . GFR calc Af Amer 07/01/2019 >60  >60 mL/min Final  . Anion gap 07/01/2019 11  5 - 15 Final   Performed at [facility], [place] [address] [place], [place] [address]  . ABO/RH(D) 07/01/2019 A POS  Final  . Antibody Screen 07/01/2019 NEG   Final  . Sample Expiration 07/01/2019 07/15/2019,2359    Final  . Extend sample reason 07/01/2019    Final                   Value:NO TRANSFUSIONS OR PREGNANCY IN THE PAST 3 MONTHS Performed at [facility], [address] [place], [place] [address]   . ABO/RH(D) 07/01/2019    Final                   Value:A POS Performed at [facility], [place] [address], [place], [place] [address]     Physical Examination:  BP 140/76 (BP Location: Left Arm, Patient Position: Sitting, Cuff Size: Normal)   Pulse 99   Ht 5\' 2"  (1.575 m)   Wt 189 lb 12.8 oz (86.1 kg)   SpO2 96%   BMI 34.71 kg/m         ASSESSMENT:  Diabetes type 2, poorly controlled with persistent hyperglycemia  See history of present illness for detailed discussion of current diabetes management, blood sugar patterns and problems identified  A1c has been previously around 13%  With changing to basal bolus insulin along with Invokana her blood sugars are significantly better Also she has made significant improvement in her diet She does not exercise Her weight is not improving but overall her average blood sugar is down significantly from before  She is compliant with her mealtime insulin regimen and does not appear to have consistently high readings after meals but amount of monitoring is limited to readings before meals especially breakfast  Also fasting readings are variable possibly because of using once a day Basaglar but also not adjusting her mealtime dose based on her portions Likely needs higher amount of insulin coverage at dinnertime   HYPERLIPIDEMIA: Not controlled as of 7/19 labs to be done today  PLAN:   Discussed the need for monitoring after meals Also discussed blood sugar targets both fasting and after meals She needs to likely reduce her morning insulin since she has very little carbohydrate at breakfast In the meantime she will increase her suppertime dose to 22 units but increase the NovoLog further if readings are over 180 after dinner She  will use 64 basal and try to keep her morning sugars below 130 She needs to start walking in the morning Check electrolytes and renal function with starting Invokana Check lipids Follow-up in 6 weeks   His gynecologist May proceed with hysterectomy    Patient Instructions  Check blood sugars on waking up 5 days a week  Also check blood sugars about 2 hours after meals and do this after different meals by rotation  Recommended blood sugar levels on waking up are 90-130 and about 2 hours after meal is 130-180  Please bring your blood sugar monitor to each visit, thank you  Walk daily  Dinner Novolog 22 units Am Novolog 18 in am  Basaglar 64   Counseling time on subjects discussed in assessment and plan sections is over 50% of today's 25 minute visit  Elayne Snare 07/21/2019, 1:21 PM   Note: This office note was prepared with Dragon voice recognition system technology. Any transcriptional errors that result from this process are unintentional.

## 2019-07-22 LAB — FRUCTOSAMINE: Fructosamine: 325 umol/L — ABNORMAL HIGH (ref 0–285)

## 2019-07-22 NOTE — Progress Notes (Signed)
Anesthesia Chart Review:  Case: [id number] Date/Time: 08/07/19 0700   Procedure: LAPAROSCOPIC ASSISTED VAGINAL HYSTERECTOMY WITH SALPINGO OOPHORECTOMY, possible abdominal hysterectomy (Bilateral ) - possible abdominal hysterectomy pt is diabetic and hypertensive   Anesthesia type: General   Pre-op diagnosis: bilateral ovarian cysts, PMB   Location: [name]   Surgeon: Everlene Farrier, MD     [name] is scheduled for 07/30/19. COVID-19 test for 08/04/19.   DISCUSSION: Patient is a 53 year old female scheduled for the above procedure. Surgery was initially scheduled for 07/08/19, but DM was poorly controlled with A1c of 13.8% with CBGs in the 300's. Since then patient has been evaluated by her endocrinologist Dr. Dwyane Dee twice, last on 07/21/19. According to his note, pre-meal CBGs 129-271, lunch 108-142, dinner 133-154. He felt that blood sugars "significantly better" with changing to basal bolus insulin along with Invokana. She had also made some improvements in her diet. Suppertime insulin increased with plans for 6 week follow-up. In the interim, he felt she "May proceed with hysterectomy."  History includes never smoker, DM2, asthma, HLD, HTN, angioedema (related to ACE-I), chronic urticaria, anxiety. BMI is consistent with obesity.   She was seen by cardiologist Dr. Marlou Porch on 3/19/290 following 12/23/18 ED visit for chest pain and tachycardia. Glucose was > 500. Recent echo unremarkable. He felt symptoms atypical (non-exertional) and did not recommend further cardiac testing unless symptoms become more significant or more worrisome.  Caffeine intake and potentially dehydration related to hyperglycemia thought to be contributing factors of her tachycardia, so less caffeine and better DM control recommended.  Additional input pending [name] visit/labs.   VS: There were no vitals taken for this visit.   PROVIDERS: Lance Sell, NP is PCP  Candee Furbish, MD is cardiologist. Last visit  02/20/19 with PRN follow-up.  Elayne Snare, MD is endocrinologist    LABS: PENDING [name] visit.  (all labs ordered are listed, but only abnormal results are displayed)  Labs Reviewed - No data to display     IMAGES: CXR 12/23/18: FINDINGS: The lungs are clear and negative for focal airspace consolidation, pulmonary edema or suspicious pulmonary nodule. No pleural effusion or pneumothorax. Cardiac and mediastinal contours are within normal limits. No acute fracture or lytic or blastic osseous lesions. The visualized upper abdominal bowel gas pattern is unremarkable. IMPRESSION: No active cardiopulmonary disease.   EKG: 12/23/18: ST at 118 bpm   CV: Echo (limited echo with saline microcavitation study) 04/02/18: Impressions: - Limited study to R/O shunt; LV function is preserved on one apical image; no effusion; negative saline microcavitation study.    Echo 03/29/18: Study Conclusions - Left ventricle: The cavity size was normal. Wall thickness was normal. Systolic function was normal. The estimated ejection fraction was in the range of 55% to 60%. Left ventricular diastolic function parameters were normal for the patient&'s age. - Aortic valve: Valve area (VTI): 2.93 cm^2. Valve area (Vmax): 3.17 cm^2. Valve area (Vmean): 2.95 cm^2. - Mitral valve: Valve area by pressure half-time: 2.18 cm^2.   Past Medical History:  Diagnosis Date  . Allergy   . 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-I)   . Anxiety   . Asthma   . Diabetes mellitus    type II  . Hyperlipidemia   . Hypertension   . Urticaria, chronic   . UTI (lower urinary tract infection)     Past Surgical History:  Procedure Laterality Date  . breast lift  1993  . CATARACT EXTRACTION, BILATERAL Bilateral  2014  . SOFT TISSUE MASS EXCISION Right 2016   [place] area  . TUBAL LIGATION  1995  . tummy tuck  1993    MEDICATIONS: . amLODipine (NORVASC) 10 MG tablet  .  atorvastatin (LIPITOR) 20 MG tablet  . canagliflozin (INVOKANA) 100 MG TABS tablet  . Cholecalciferol (VITAMIN D) 50 MCG (2000 UT) CAPS  . escitalopram (LEXAPRO) 20 MG tablet  . hydrochlorothiazide (HYDRODIURIL) 12.5 MG tablet  . insulin aspart (NOVOLOG FLEXPEN) 100 UNIT/ML FlexPen  . Insulin Glargine (BASAGLAR KWIKPEN) 100 UNIT/ML SOPN  . Insulin Pen Needle (BD PEN NEEDLE NANO 2ND GEN) 32G X 4 MM MISC  . Melatonin 5 MG TABS  . montelukast (SINGULAIR) 10 MG tablet  . vitamin B-12 (CYANOCOBALAMIN) 1000 MCG tablet  . zinc gluconate 50 MG tablet   No current facility-administered medications for this encounter.     Myra Gianotti, PA-C Surgical Short Stay/Anesthesiology [facility] Phone [phone] [facility] Phone [phone] 07/22/2019 1:26 PM

## 2019-07-23 NOTE — Progress Notes (Signed)
Please call to let patient know that cholesterol is at target.  Short-term diabetes control much improved although not at target yet

## 2019-07-24 ENCOUNTER — Other Ambulatory Visit: Payer: Self-pay | Admitting: Endocrinology

## 2019-07-24 ENCOUNTER — Encounter: Payer: Self-pay | Admitting: Internal Medicine

## 2019-07-29 NOTE — Progress Notes (Signed)
[place], [place] - [id number] [place] [address] Phone: [phone] Fax: [place]), [place] - [address] [place] [address] Phone: [phone] Fax: [phone]      Your procedure is scheduled on August 07, 2019.  Report to [facility] Main Entrance "A" at 05:30 A.M., and check in at the Admitting office.  Call this number if you have problems the morning of surgery:  [phone]  Call [phone] if you have any questions prior to your surgery date Monday-Friday 8am-4pm    Remember:  Do not eat or drink after midnight the night before your surgery  You may drink clear liquids until 04:15am the morning of your surgery.   Clear liquids allowed are: Water, Non-Citrus Juices (without pulp), Carbonated Beverages, Clear Tea, Black Coffee Only, and Gatorade    Take these medicines the morning of surgery with A SIP OF WATER : Amlodipine (Norvasc) Atorvastatin (Lipitor) Escitalopram (Lexapro)   WHAT DO I DO ABOUT MY DIABETES MEDICATION?  **DO NOT TAKE INVOKANA the Day BEFORE YOUR SURGERY and DO NOT TAKE INVOKANA the MORNING of your SURGERY.  Depending when you take your basal insulin: please note the following instructions:  . THE NIGHT BEFORE SURGERY, take 30 units of Insulin Glargine (Lantus) insulin (If you take your Lantus at night)       . THE MORNING OF SURGERY, take 30 units of Insulin Glargine (Lantus) insulin (If you take your Lantus in the morning)       . The day of surgery, do not take other diabetes injectables, including Byetta (exenatide), Bydureon (exenatide ER), Victoza (liraglutide), or Trulicity (dulaglutide).  . If your CBG is greater than 220 mg/dL, you may take  of your sliding scale (correction) dose of insulin.   How to Manage Your Diabetes Before and After Surgery  Why is it important to  control my blood sugar before and after surgery? . Improving blood sugar levels before and after surgery helps healing and can limit problems. . A way of improving blood sugar control is eating a healthy diet by: o  Eating less sugar and carbohydrates o  Increasing activity/exercise o  Talking with your doctor about reaching your blood sugar goals . High blood sugars (greater than 180 mg/dL) can raise your risk of infections and slow your recovery, so you will need to focus on controlling your diabetes during the weeks before surgery. . Make sure that the doctor who takes care of your diabetes knows about your planned surgery including the date and location.  How do I manage my blood sugar before surgery? . Check your blood sugar at least 4 times a day, starting 2 days before surgery, to make sure that the level is not too high or low. o Check your blood sugar the morning of your surgery when you wake up and every 2 hours until you get to the Short Stay unit. . If your blood sugar is less than 70 mg/dL, you will need to treat for low blood sugar: o Do not take insulin. o Treat a low blood sugar (less than 70 mg/dL) with  cup of clear juice (cranberry or apple), 4 glucose tablets, OR glucose gel. o Recheck blood sugar in 15 minutes after treatment (to make sure it is greater than 70 mg/dL). If your blood sugar is not greater than 70 mg/dL on recheck, call [phone] for further instructions. Marland Kitchen  Report your blood sugar to the short stay nurse when you get to Short Stay.  . If you are admitted to the hospital after surgery: o Your blood sugar will be checked by the staff and you will probably be given insulin after surgery (instead of oral diabetes medicines) to make sure you have good blood sugar levels. o The goal for blood sugar control after surgery is 80-180 mg/dL.     7 days prior to surgery STOP taking any Aspirin (unless otherwise instructed by your surgeon), Aleve, Naproxen,  Ibuprofen, Motrin, Advil, Goody's, BC's, all herbal medications, fish oil, and all vitamins.    The Morning of Surgery  Do not wear jewelry, make-up or nail polish.  Do not wear lotions, powders, or perfumes/colognes, or deodorant  Do not shave 48 hours prior to surgery.    Do not bring valuables to the hospital.  [facility] is not responsible for any belongings or valuables.  If you are a smoker, DO NOT Smoke 24 hours prior to surgery IF you wear a CPAP at night please bring your mask, tubing, and machine the morning of surgery   Remember that you must have someone to transport you home after your surgery, and remain with you for 24 hours if you are discharged the same day.   Contacts, glasses, hearing aids, dentures or bridgework may not be worn into surgery.    Leave your suitcase in the car.  After surgery it may be brought to your room.  For patients admitted to the hospital, discharge time will be determined by your treatment team.  Patients discharged the day of surgery will not be allowed to drive home.    Special instructions:   - Preparing For Surgery  Before surgery, you can play an important role. Because skin is not sterile, your skin needs to be as free of germs as possible. You can reduce the number of germs on your skin by washing with CHG (chlorahexidine gluconate) Soap before surgery.  CHG is an antiseptic cleaner which kills germs and bonds with the skin to continue killing germs even after washing.    Oral Hygiene is also important to reduce your risk of infection.  Remember - BRUSH YOUR TEETH THE MORNING OF SURGERY WITH YOUR REGULAR TOOTHPASTE  Please do not use if you have an allergy to CHG or antibacterial soaps. If your skin becomes reddened/irritated stop using the CHG.  Do not shave (including legs and underarms) for at least 48 hours prior to first CHG shower. It is OK to shave your face.  Please follow these instructions  carefully.   1. Shower the NIGHT BEFORE SURGERY and the MORNING OF SURGERY with CHG Soap.   2. If you chose to wash your hair, wash your hair first as usual with your normal shampoo.  3. After you shampoo, rinse your hair and body thoroughly to remove the shampoo.  4. Use CHG as you would any other liquid soap. You can apply CHG directly to the skin and wash gently with a scrungie or a clean washcloth.   5. Apply the CHG Soap to your body ONLY FROM THE NECK DOWN.  Do not use on open wounds or open sores. Avoid contact with your eyes, ears, mouth and genitals (private parts). Wash Face and genitals (private parts)  with your normal soap.   6. Wash thoroughly, paying special attention to the area where your surgery will be performed.  7. Thoroughly rinse your body with warm  water from the neck down.  8. DO NOT shower/wash with your normal soap after using and rinsing off the CHG Soap.  9. Pat yourself dry with a CLEAN TOWEL.  10. Wear CLEAN PAJAMAS to bed the night before surgery, wear comfortable clothes the morning of surgery  11. Place CLEAN SHEETS on your bed the night of your first shower and DO NOT SLEEP WITH PETS.    Day of Surgery:  Do not apply any deodorants/lotions. Please shower the morning of surgery with the CHG soap  Please wear clean clothes to the hospital/surgery center.   Remember to brush your teeth WITH YOUR REGULAR TOOTHPASTE.   Please read over the following fact sheets that you were given.

## 2019-07-30 ENCOUNTER — Other Ambulatory Visit: Payer: Self-pay

## 2019-07-30 ENCOUNTER — Encounter (HOSPITAL_COMMUNITY): Payer: Self-pay

## 2019-07-30 ENCOUNTER — Encounter (HOSPITAL_COMMUNITY)
Admission: RE | Admit: 2019-07-30 | Discharge: 2019-07-30 | Disposition: A | Discharge status: Home / Self Care | Payer: 59 | Source: Ambulatory Visit | Attending: Obstetrics and Gynecology | Admitting: Obstetrics and Gynecology

## 2019-07-30 DIAGNOSIS — N83202 Unspecified ovarian cyst, left side: Secondary | ICD-10-CM | POA: Insufficient documentation

## 2019-07-30 DIAGNOSIS — Z79899 Other long term (current) drug therapy: Secondary | ICD-10-CM | POA: Diagnosis not present

## 2019-07-30 DIAGNOSIS — E785 Hyperlipidemia, unspecified: Secondary | ICD-10-CM | POA: Diagnosis not present

## 2019-07-30 DIAGNOSIS — J45909 Unspecified asthma, uncomplicated: Secondary | ICD-10-CM | POA: Insufficient documentation

## 2019-07-30 DIAGNOSIS — N83201 Unspecified ovarian cyst, right side: Secondary | ICD-10-CM | POA: Insufficient documentation

## 2019-07-30 DIAGNOSIS — E119 Type 2 diabetes mellitus without complications: Secondary | ICD-10-CM | POA: Insufficient documentation

## 2019-07-30 DIAGNOSIS — I10 Essential (primary) hypertension: Secondary | ICD-10-CM | POA: Insufficient documentation

## 2019-07-30 DIAGNOSIS — Z794 Long term (current) use of insulin: Secondary | ICD-10-CM | POA: Diagnosis not present

## 2019-07-30 DIAGNOSIS — F419 Anxiety disorder, unspecified: Secondary | ICD-10-CM | POA: Diagnosis not present

## 2019-07-30 DIAGNOSIS — Z01812 Encounter for preprocedural laboratory examination: Secondary | ICD-10-CM | POA: Diagnosis present

## 2019-07-30 LAB — CBC
HCT: 42.1 % (ref 36.0–46.0)
Hemoglobin: 12.9 g/dL (ref 12.0–15.0)
MCH: 25.9 pg — ABNORMAL LOW (ref 26.0–34.0)
MCHC: 30.6 g/dL (ref 30.0–36.0)
MCV: 84.4 fL (ref 80.0–100.0)
Platelets: 343 10*3/uL (ref 150–400)
RBC: 4.99 MIL/uL (ref 3.87–5.11)
RDW: 12.7 % (ref 11.5–15.5)
WBC: 8.5 10*3/uL (ref 4.0–10.5)
nRBC: 0 % (ref 0.0–0.2)

## 2019-07-30 LAB — BASIC METABOLIC PANEL
Anion gap: 10 (ref 5–15)
BUN: 10 mg/dL (ref 6–20)
CO2: 25 mmol/L (ref 22–32)
Calcium: 9.3 mg/dL (ref 8.9–10.3)
Chloride: 104 mmol/L (ref 98–111)
Creatinine, Ser: 0.42 mg/dL — ABNORMAL LOW (ref 0.44–1.00)
GFR calc Af Amer: >60 mL/min (ref >60)
GFR calc non Af Amer: >60 mL/min (ref >60)
Glucose, Bld: 148 mg/dL — ABNORMAL HIGH (ref 70–99)
Potassium: 3.7 mmol/L (ref 3.5–5.1)
Sodium: 139 mmol/L (ref 135–145)

## 2019-07-30 LAB — TYPE AND SCREEN
ABO/RH(D): A POS
Antibody Screen: NEGATIVE

## 2019-07-30 LAB — GLUCOSE, CAPILLARY: Glucose-Capillary: 169 mg/dL — ABNORMAL HIGH (ref 70–99)

## 2019-07-30 MED ORDER — BD PEN NEEDLE NANO 2ND GEN 32G X 4 MM MISC: 11 refills | Status: AC

## 2019-07-30 NOTE — Progress Notes (Addendum)
[place], [place] - [id number] [place] [address] Phone: [phone] Fax: [place] [address] [place]), [place] - [address] [place] [address] Phone: [phone] Fax: [phone]    Your procedure is scheduled on Thursday, September 3rd.  Report to [facility] Main Entrance "A" at 05:30 A.M., and check in at the Admitting office.  Call this number if you have problems the morning of surgery:  [phone]  Call [phone] if you have any questions prior to your surgery date Monday-Friday 8am-4pm   Remember:  Do not eat after midnight the night before your surgery  You may drink clear liquids until 04:15am the morning of your surgery.   Clear liquids allowed are: Water, Non-Citrus Juices (without pulp), Carbonated Beverages, Clear Tea, Black Coffee Only, and Gatorade    Take these medicines the morning of surgery with A SIP OF WATER : Amlodipine (Norvasc) Atorvastatin (Lipitor) Escitalopram (Lexapro)  WHAT DO I DO ABOUT MY DIABETES MEDICATION?  DO NOT TAKE INVOKANA the Day BEFORE YOUR SURGERY and DO NOT TAKE INVOKANA the MORNING of your SURGERY.  Depending when you take your basal insulin: please note the following instructions:  . THE NIGHT BEFORE SURGERY, take 30 units of Insulin Glargine (Lantus) insulin (If you normally take your Lantus at night)      . THE MORNING OF SURGERY, take 30 units of Insulin Glargine (Lantus) insulin (If you normally take your Lantus in the morning)     . The day of surgery, do not take other diabetes injectables, including Byetta (exenatide), Bydureon (exenatide ER), Victoza (liraglutide), or Trulicity (dulaglutide).  . If your CBG is greater than 220 mg/dL, you may take  of your sliding scale (correction) dose of insulin.  How to Manage Your Diabetes Before and After Surgery  Why is it important to control  my blood sugar before and after surgery? . Improving blood sugar levels before and after surgery helps healing and can limit problems. . A way of improving blood sugar control is eating a healthy diet by: o  Eating less sugar and carbohydrates o  Increasing activity/exercise o  Talking with your doctor about reaching your blood sugar goals . High blood sugars (greater than 180 mg/dL) can raise your risk of infections and slow your recovery, so you will need to focus on controlling your diabetes during the weeks before surgery. . Make sure that the doctor who takes care of your diabetes knows about your planned surgery including the date and location.  How do I manage my blood sugar before surgery? . Check your blood sugar at least 4 times a day, starting 2 days before surgery, to make sure that the level is not too high or low. o Check your blood sugar the morning of your surgery when you wake up and every 2 hours until you get to the Short Stay unit. . If your blood sugar is less than 70 mg/dL, you will need to treat for low blood sugar: o Do not take insulin. o Treat a low blood sugar (less than 70 mg/dL) with  cup of clear juice (cranberry or apple), 4 glucose tablets, OR glucose gel. o Recheck blood sugar in 15 minutes after treatment (to make sure it is greater than 70 mg/dL). If your blood sugar is not greater than 70 mg/dL on recheck, call [phone] for further instructions. . Report your blood sugar to the short stay  nurse when you get to Short Stay.  . If you are admitted to the hospital after surgery: o Your blood sugar will be checked by the staff and you will probably be given insulin after surgery (instead of oral diabetes medicines) to make sure you have good blood sugar levels. o The goal for blood sugar control after surgery is 80-180 mg/dL.  7 days prior to surgery STOP taking any Aspirin (unless otherwise instructed by your surgeon), Aleve, Naproxen, Ibuprofen, Motrin,  Advil, Goody's, BC's, all herbal medications, fish oil, and all vitamins.   The Morning of Surgery  Do not wear jewelry, make-up or nail polish.  Do not wear lotions, powders, or perfumes/colognes, or deodorant  Do not shave 48 hours prior to surgery.    Do not bring valuables to the hospital.  [facility] is not responsible for any belongings or valuables.  If you are a smoker, DO NOT Smoke 24 hours prior to surgery IF you wear a CPAP at night please bring your mask, tubing, and machine the morning of surgery   Remember that you must have someone to transport you home after your surgery, and remain with you for 24 hours if you are discharged the same day.  Contacts, glasses, hearing aids, dentures or bridgework may not be worn into surgery.   Leave your suitcase in the car.  After surgery it may be brought to your room.  For patients admitted to the hospital, discharge time will be determined by your treatment team.  Patients discharged the day of surgery will not be allowed to drive home.   Special instructions:   [place]- Preparing For Surgery  Before surgery, you can play an important role. Because skin is not sterile, your skin needs to be as free of germs as possible. You can reduce the number of germs on your skin by washing with CHG (chlorahexidine gluconate) Soap before surgery.  CHG is an antiseptic cleaner which kills germs and bonds with the skin to continue killing germs even after washing.    Oral Hygiene is also important to reduce your risk of infection.  Remember - BRUSH YOUR TEETH THE MORNING OF SURGERY WITH YOUR REGULAR TOOTHPASTE  Please do not use if you have an allergy to CHG or antibacterial soaps. If your skin becomes reddened/irritated stop using the CHG.  Do not shave (including legs and underarms) for at least 48 hours prior to first CHG shower. It is OK to shave your face.  Please follow these instructions carefully.   1. Shower the NIGHT BEFORE  SURGERY and the MORNING OF SURGERY with CHG Soap.   2. If you chose to wash your hair, wash your hair first as usual with your normal shampoo.  3. After you shampoo, rinse your hair and body thoroughly to remove the shampoo.  4. Use CHG as you would any other liquid soap. You can apply CHG directly to the skin and wash gently with a scrungie or a clean washcloth.   5. Apply the CHG Soap to your body ONLY FROM THE NECK DOWN.  Do not use on open wounds or open sores. Avoid contact with your eyes, ears, mouth and genitals (private parts). Wash Face and genitals (private parts)  with your normal soap.   6. Wash thoroughly, paying special attention to the area where your surgery will be performed.  7. Thoroughly rinse your body with warm water from the neck down.  8. DO NOT shower/wash with your normal soap after  using and rinsing off the CHG Soap.  9. Pat yourself dry with a CLEAN TOWEL.  10. Wear CLEAN PAJAMAS to bed the night before surgery, wear comfortable clothes the morning of surgery  11. Place CLEAN SHEETS on your bed the night of your first shower and DO NOT SLEEP WITH PETS.  Day of Surgery: Do not apply any deodorants/lotions. Please shower the morning of surgery with the CHG soap  Please wear clean clothes to the hospital/surgery center.   Remember to brush your teeth WITH YOUR REGULAR TOOTHPASTE.  Please read over the following fact sheets that you were given.

## 2019-07-30 NOTE — Progress Notes (Signed)
PCP - [place] Cardiologist - denies  Chest x-ray - denies EKG -  Stress Test -  ECHO - 03/29/18 Cardiac Cath - denies  Sleep Study - denies CPAP - N/A  Fasting Blood Sugar - 108-180 Checks Blood Sugar 3 times a day  Blood Thinner Instructions: N/A Aspirin Instructions: N/A  Anesthesia review: Yes, A1C  Coronavirus Screening  Have you experienced the following symptoms:  Cough yes/no: No Fever (>100.75F)  yes/no: No Runny nose yes/no: No Sore throat yes/no: No Difficulty breathing/shortness of breath  yes/no: No  Have you or a family member traveled in the last 14 days and where? yes/no: No  If the patient indicates "YES" to the above questions, their PAT will be rescheduled to limit the exposure to others and, the surgeon will be notified. THE PATIENT WILL NEED TO BE ASYMPTOMATIC FOR 14 DAYS.   If the patient is not experiencing any of these symptoms, the PAT nurse will instruct them to NOT bring anyone with them to their appointment since they may have these symptoms or traveled as well.   Please remind your patients and families that hospital visitation restrictions are in effect and the importance of the restrictions.   Patient denies shortness of breath, fever, cough and chest pain at PAT appointment  Patient verbalized understanding of instructions that were given to them at the PAT appointment. Patient was also instructed that they will need to review over the PAT instructions again at home before surgery.

## 2019-07-31 NOTE — Progress Notes (Signed)
Anesthesia Chart Review:  Case: [id number] Date/Time: 08/07/19 0700   Procedure: LAPAROSCOPIC ASSISTED VAGINAL HYSTERECTOMY WITH SALPINGO OOPHORECTOMY, possible abdominal hysterectomy (Bilateral ) - possible abdominal hysterectomy pt is diabetic and hypertensive   Anesthesia type: General   Pre-op diagnosis: bilateral ovarian cysts, PMB   Location: [facility] OR ROOM 08 / [place] OR   Surgeon: Everlene Farrier, MD      DISCUSSION: Patient is a 53 year old female scheduled for the above procedure.  Surgery was initially scheduled for 07/08/19, but was postponed to allow time for endocrinology follow-up to get DM better controlled as A1c was 13.8% with reported fasting CBGs in the 300's. (She had apparently stopped some of her DM medications due to side effects and had told her endocrinologist.) Since then she has seen her endocrinologist Dr. Dwyane Dee twice, last on 07/21/19. Insulin was changed to glargine and mealtime lispro and canagliflozin added.  At last visit, fasting CBG 129-271, lunch 108-142 (post 233), dinner 133-154 (post 205). Fructosamine 325. He advised to increase suppertime insulin coverage and increase basal insulin and wrote, "May proceed with hysterectomy".    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 BMI is consistent with obesity.   She was seen by cardiologist Dr. Marlou Porch on 3/19/290 following 12/23/18 ED visit for chest pain and tachycardia. Glucose was > 500. Recent echo unremarkable. He felt symptoms atypical (non-exertional) and did not recommend further cardiac testing unless symptoms become more significant or more worrisome.  Caffeine intake and potentially dehydration related to hyperglycemia thought to be contributing factors of her tachycardia, so less caffeine and better DM control recommended.  Patient now with endocrinology clearance and reports fasting CBGs have improved to ~ 108-180. Presurgical COVID-19 test  scheduled for 08/03/09. If results negative and otherwise no acute changes and I anticipate that she could proceed as planned.  She does need a urine pregnancy test on the day of surgery.   VS: BP (!) 144/75   Pulse 99   Temp 36.5 C   Resp 18   Ht 5\' 2"  (1.575 m)   Wt 87.5 kg   SpO2 98%   BMI 35.26 kg/m     PROVIDERS: Lance Sell, NP is PCP  Candee Furbish, MD is cardiologist. Last visit 02/20/19 with PRN follow-up.  Elayne Snare, MD is endocrinologist   LABS: Labs reviewed: Acceptable for surgery. (all labs ordered are listed, but only abnormal results are displayed)  Labs Reviewed  GLUCOSE, CAPILLARY - Abnormal; Notable for the following components:      Result Value   Glucose-Capillary 169 (*)    All other components within normal limits  CBC - Abnormal; Notable for the following components:   MCH 25.9 (*)    All other components within normal limits  BASIC METABOLIC PANEL - Abnormal; Notable for the following components:   Glucose, Bld 148 (*)    Creatinine, Ser 0.42 (*)    All other components within normal limits  TYPE AND SCREEN    IMAGES: CXR 12/23/18: FINDINGS: The lungs are clear and negative for focal airspace consolidation, pulmonary edema or suspicious pulmonary nodule. No pleural effusion or pneumothorax. Cardiac and mediastinal contours are within normal limits. No acute fracture or lytic or blastic osseous lesions. The visualized upper abdominal bowel gas pattern is unremarkable. IMPRESSION: No active cardiopulmonary disease.   EKG: 12/23/18: ST at 118 bpm   CV: Echo (limited echo with saline microcavitation study) 04/02/18: Impressions: - Limited study to R/O shunt;  LV function is preserved on one apical image; no effusion; negative saline microcavitation study.    Echo 03/29/18: Study Conclusions - Left ventricle: The cavity size was normal. Wall thickness was normal. Systolic function was normal. The estimated  ejection fraction was in the range of 55% to 60%. Left ventricular diastolic function parameters were normal for the patient&'s age. - Aortic valve: Valve area (VTI): 2.93 cm^2. Valve area (Vmax): 3.17 cm^2. Valve area (Vmean): 2.95 cm^2. - Mitral valve: Valve area by pressure half-time: 2.18 cm^2.    Past Medical History:  Diagnosis Date  . Allergy   . 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-I)   . Anxiety   . Asthma   . Diabetes mellitus    type II  . Hyperlipidemia   . Hypertension   . Urticaria, chronic   . UTI (lower urinary tract infection)     Past Surgical History:  Procedure Laterality Date  . breast lift  1993  . CATARACT EXTRACTION, BILATERAL Bilateral 2014  . SOFT TISSUE MASS EXCISION Right 2016   [place]  . TUBAL LIGATION  1995  . tummy tuck  1993    MEDICATIONS: . amLODipine (NORVASC) 10 MG tablet  . atorvastatin (LIPITOR) 20 MG tablet  . canagliflozin (INVOKANA) 100 MG TABS tablet  . Cholecalciferol (VITAMIN D) 50 MCG (2000 UT) CAPS  . escitalopram (LEXAPRO) 20 MG tablet  . hydrochlorothiazide (HYDRODIURIL) 12.5 MG tablet  . insulin aspart (NOVOLOG FLEXPEN) 100 UNIT/ML FlexPen  . Insulin Glargine (BASAGLAR KWIKPEN) 100 UNIT/ML SOPN  . Insulin Pen Needle (BD PEN NEEDLE NANO 2ND GEN) 32G X 4 MM MISC  . Melatonin 5 MG TABS  . montelukast (SINGULAIR) 10 MG tablet  . vitamin B-12 (CYANOCOBALAMIN) 1000 MCG tablet  . zinc gluconate 50 MG tablet   No current facility-administered medications for this encounter.     Myra Gianotti, PA-C Surgical Short Stay/Anesthesiology [facility] Phone [phone] [facility] Phone [phone] 07/31/2019 10:46 AM

## 2019-07-31 NOTE — Anesthesia Preprocedure Evaluation (Addendum)
Anesthesia Evaluation  Patient identified by MRN, date of birth, ID band Patient awake    Reviewed: Allergy & Precautions, NPO status , Patient's Chart, lab work & pertinent test results  Airway Mallampati: I  TM Distance: >3 FB Neck ROM: Full    Dental   Pulmonary asthma ,    Pulmonary exam normal        Cardiovascular hypertension, Pt. on medications Normal cardiovascular exam     Neuro/Psych Anxiety Depression    GI/Hepatic   Endo/Other  diabetes, Type 2, Insulin Dependent  Renal/GU      Musculoskeletal   Abdominal   Peds  Hematology   Anesthesia Other Findings   Reproductive/Obstetrics                            Anesthesia Physical Anesthesia Plan  ASA: II  Anesthesia Plan: General   Post-op Pain Management:    Induction: Intravenous  PONV Risk Score and Plan: 3 and Ondansetron, Dexamethasone and Midazolam  Airway Management Planned: Oral ETT  Additional Equipment:   Intra-op Plan:   Post-operative Plan: Extubation in OR  Informed Consent: I have reviewed the patients History and Physical, chart, labs and discussed the procedure including the risks, benefits and alternatives for the proposed anesthesia with the patient or authorized representative who has indicated his/her understanding and acceptance.       Plan Discussed with: CRNA and Surgeon  Anesthesia Plan Comments: (PAT note written 07/31/2019 by Myra Gianotti, PA-C. )       Anesthesia Quick Evaluation

## 2019-08-01 IMAGING — NM NM PULMONARY VENT & PERF
16 series · 16 of 16 positions shown · non-contrast
Comparison: Chest radiograph 01/03/2018

CLINICAL DATA: Patient was dosed iv with 4.2 mci 77mtc MAAfor
perfusion and 32 mci I 131 for ventialtion. Hx 2 weeks history of
epigastric pain, lower back pain, decrease in appetite and
intractable nausea and vomiting. Elevated creatinine.^5millicurie
MAA TECHNETIUM TO 99M ALBUMIN AGGREGATED, 5millicurie DTPA
TECHNETIUM TC 99M DIETHYLENETRIAME-PENTAACETIC ACID

EXAM:
NUCLEAR MEDICINE VENTILATION - PERFUSION LUNG SCAN
TECHNIQUE: Ventilation images were obtained in multiple projections using
inhaled aerosol 4c-LLm DTPA. Perfusion images were obtained in
multiple projections after intravenous injection of 4c-LLm MAA.
RADIOPHARMACEUTICALS:  32 mCi of 4c-LLm DTPA aerosol inhalation and
4.2 mCi 3cQQm MAA-IV

[Series 1: ant/post vent · 4.14mm/px · 1 of 1 slices shown (1 of 2)]
[im 1/1]
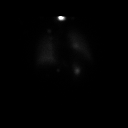

[Series 1: ant/post vent · 4.14mm/px · 1 of 1 slices shown (2 of 2)]
[im 1/1]
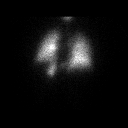

[Series 2: lao/rpo vent · 4.14mm/px · 1 of 1 slices shown (1 of 2)]
[im 1/1]
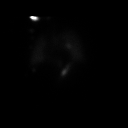

[Series 2: lao/rpo vent · 4.14mm/px · 1 of 1 slices shown (2 of 2)]
[im 1/1]
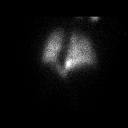

[Series 3: lpo/rao vent · 4.14mm/px · 1 of 1 slices shown (1 of 2)]
[im 1/1]
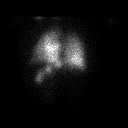

[Series 3: lpo/rao vent · 4.14mm/px · 1 of 1 slices shown (2 of 2)]
[im 1/1]
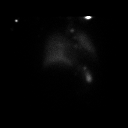

[Series 4: lt lat/rt lat vent · 4.14mm/px · 1 of 1 slices shown (1 of 2)]
[im 1/1]
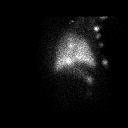

[Series 4: lt lat/rt lat vent · 4.14mm/px · 1 of 1 slices shown (2 of 2)]
[im 1/1]
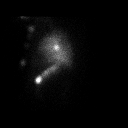

[Series 5: lt lat/rt lat perf · 4.14mm/px · 1 of 1 slices shown (1 of 2)]
[im 1/1]
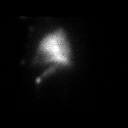

[Series 5: lt lat/rt lat perf · 4.14mm/px · 1 of 1 slices shown (2 of 2)]
[im 1/1]
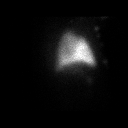

[Series 6: lpo/rao perf · 4.14mm/px · 1 of 1 slices shown (1 of 2)]
[im 1/1]
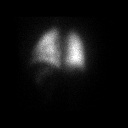

[Series 6: lpo/rao perf · 4.14mm/px · 1 of 1 slices shown (2 of 2)]
[im 1/1]
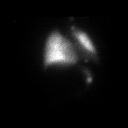

[Series 7: ant/post perf · 4.14mm/px · 1 of 1 slices shown (1 of 2)]
[im 1/1]
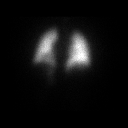

[Series 7: ant/post perf · 4.14mm/px · 1 of 1 slices shown (2 of 2)]
[im 1/1]
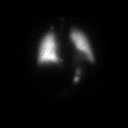

[Series 8: lao/rpo perf · 4.14mm/px · 1 of 1 slices shown (1 of 2)]
[im 1/1]
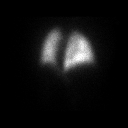

[Series 8: lao/rpo perf · 4.14mm/px · 1 of 1 slices shown (2 of 2)]
[im 1/1]
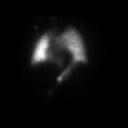

[16 of 16 positions shown; findings below may reference images not displayed]

FINDINGS: Ventilation: No focal ventilation defect.

Perfusion: No wedge shaped peripheral perfusion defects to suggest
acute pulmonary embolism.
IMPRESSION: No evidence acute pulmonary embolism.  Normal lung perfusion.

## 2019-08-04 ENCOUNTER — Other Ambulatory Visit (HOSPITAL_COMMUNITY)
Admission: RE | Admit: 2019-08-04 | Discharge: 2019-08-04 | Disposition: A | Discharge status: Home / Self Care | Payer: 59 | Source: Ambulatory Visit | Attending: Obstetrics and Gynecology | Admitting: Obstetrics and Gynecology

## 2019-08-04 DIAGNOSIS — Z20828 Contact with and (suspected) exposure to other viral communicable diseases: Secondary | ICD-10-CM | POA: Insufficient documentation

## 2019-08-04 DIAGNOSIS — N83201 Unspecified ovarian cyst, right side: Secondary | ICD-10-CM | POA: Diagnosis not present

## 2019-08-04 DIAGNOSIS — Z01812 Encounter for preprocedural laboratory examination: Secondary | ICD-10-CM | POA: Insufficient documentation

## 2019-08-04 DIAGNOSIS — N924 Excessive bleeding in the premenopausal period: Secondary | ICD-10-CM | POA: Diagnosis not present

## 2019-08-04 DIAGNOSIS — N83202 Unspecified ovarian cyst, left side: Secondary | ICD-10-CM | POA: Diagnosis not present

## 2019-08-04 LAB — SARS CORONAVIRUS 2 (TAT 6-24 HRS): SARS Coronavirus 2: NEGATIVE

## 2019-08-06 NOTE — H&P (Signed)
Holly Ortiz is an 53 y.o. female G4P4 presents with irregular menses. Exam in office highly compromised by previous abdominoplasty. U/S notes uterus 7.9 x 4.6 x 5.7 cm, Left ovary 2.7 & 1.7 cm cysts questionable for dermoid and Right ovary 1.7 cm cyst questionable for dermoid. SHG 53mm polypoid mass. EMB>benign. CA-125=9.7 and FSH=16.0. Pap benign. Mammo benign.  Pertinent Gynecological History: Menses: irregular Bleeding: dysfunctional uterine bleeding Contraception: none DES exposure: denies Blood transfusions: none Sexually transmitted diseases: no past history Previous GYN Procedures: none  Last mammogram: normal Date: 2020 Last pap: normal Date: 2020 OB History: G4, P4   Menstrual History: Menarche age: unknown No LMP recorded.    Past Medical History:  Diagnosis Date  . Allergy   . 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-I)   . Anxiety   . Asthma   . Diabetes mellitus    type II  . Hyperlipidemia   . Hypertension   . Urticaria, chronic   . UTI (lower urinary tract infection)     Past Surgical History:  Procedure Laterality Date  . breast lift  1993  . CATARACT EXTRACTION, BILATERAL Bilateral 2014  . SOFT TISSUE MASS EXCISION Right 2016   [place]  . TUBAL LIGATION  1995  . tummy tuck  1993    Family History  Problem Relation Age of Onset  . Hyperlipidemia Other   . Heart disease Other   . Diabetes Father   . Hypertension Father   . COPD Father        Cause of death  . Colon cancer Mother   . Hypertension Mother   . Hypertension Sister   . Diabetes Sister   . Hypertension Brother   . Cancer Sister        Brain glioblastoma  . Diabetes Sister     Social History:  reports that she has never smoked. She has never used smokeless tobacco. She reports that she does not drink alcohol or use drugs.  Allergies:  Allergies  Allergen Reactions  . Actos [Pioglitazone Hydrochloride] Other (See Comments)    Wt gain  . Lisinopril      Angioedema, including likely GI involvement  . Metformin And Related Diarrhea and Nausea And Vomiting    No medications prior to admission.    Review of Systems  Constitutional: Negative for fever.    There were no vitals taken for this visit. Physical Exam  Cardiovascular: Normal rate and regular rhythm.  Respiratory: Effort normal.  GI: Soft.    No results found for this or any previous visit (from the past 24 hour(s)).  No results found.  Assessment/Plan: 53 yo with DUB and bilateral ovarian cysts suspicious for dermoid and endometrial mass suspicious for polyp D/W patient LAVH/BSO. Risks reviewed including infection, organ damage, bleeding/transfusion-HIV/Hep, DVT/PE, pneumonia, laparotomy, return to OR. Onset of menopause discussed. Diabetic with A1C>13. Has seen endocrinology twice recently and reports better control.  Shon Millet II 08/06/2019, 5:45 PM

## 2019-08-07 ENCOUNTER — Encounter (HOSPITAL_COMMUNITY): Payer: Self-pay

## 2019-08-07 ENCOUNTER — Encounter (HOSPITAL_COMMUNITY)
Admission: RE | Disposition: A | Discharge status: Home / Self Care | Payer: Self-pay | Source: Home / Self Care | Attending: Obstetrics and Gynecology

## 2019-08-07 ENCOUNTER — Other Ambulatory Visit: Payer: Self-pay

## 2019-08-07 ENCOUNTER — Observation Stay (HOSPITAL_COMMUNITY)
Admission: RE | Admit: 2019-08-07 | Discharge: 2019-08-08 | Disposition: A | Discharge status: Home / Self Care | Payer: 59 | Attending: Obstetrics and Gynecology | Admitting: Obstetrics and Gynecology

## 2019-08-07 ENCOUNTER — Observation Stay (HOSPITAL_COMMUNITY): Payer: 59 | Admitting: Certified Registered Nurse Anesthetist

## 2019-08-07 ENCOUNTER — Observation Stay (HOSPITAL_COMMUNITY): Payer: 59 | Admitting: Physician Assistant

## 2019-08-07 DIAGNOSIS — E785 Hyperlipidemia, unspecified: Secondary | ICD-10-CM | POA: Insufficient documentation

## 2019-08-07 DIAGNOSIS — N83291 Other ovarian cyst, right side: Secondary | ICD-10-CM | POA: Diagnosis not present

## 2019-08-07 DIAGNOSIS — F419 Anxiety disorder, unspecified: Secondary | ICD-10-CM | POA: Diagnosis not present

## 2019-08-07 DIAGNOSIS — J45909 Unspecified asthma, uncomplicated: Secondary | ICD-10-CM | POA: Diagnosis not present

## 2019-08-07 DIAGNOSIS — N800 Endometriosis of uterus: Secondary | ICD-10-CM | POA: Insufficient documentation

## 2019-08-07 DIAGNOSIS — E119 Type 2 diabetes mellitus without complications: Secondary | ICD-10-CM | POA: Diagnosis not present

## 2019-08-07 DIAGNOSIS — D282 Benign neoplasm of uterine tubes and ligaments: Secondary | ICD-10-CM | POA: Diagnosis not present

## 2019-08-07 DIAGNOSIS — N939 Abnormal uterine and vaginal bleeding, unspecified: Secondary | ICD-10-CM | POA: Diagnosis present

## 2019-08-07 DIAGNOSIS — N83292 Other ovarian cyst, left side: Secondary | ICD-10-CM | POA: Insufficient documentation

## 2019-08-07 DIAGNOSIS — Z888 Allergy status to other drugs, medicaments and biological substances status: Secondary | ICD-10-CM | POA: Insufficient documentation

## 2019-08-07 DIAGNOSIS — N921 Excessive and frequent menstruation with irregular cycle: Secondary | ICD-10-CM

## 2019-08-07 DIAGNOSIS — I10 Essential (primary) hypertension: Secondary | ICD-10-CM | POA: Diagnosis not present

## 2019-08-07 DIAGNOSIS — N920 Excessive and frequent menstruation with regular cycle: Secondary | ICD-10-CM | POA: Diagnosis present

## 2019-08-07 HISTORY — PX: LAPAROSCOPIC VAGINAL HYSTERECTOMY WITH SALPINGO OOPHORECTOMY: SHX6681

## 2019-08-07 HISTORY — PX: ABDOMINAL HYSTERECTOMY: SHX81

## 2019-08-07 LAB — POCT PREGNANCY, URINE: Preg Test, Ur: NEGATIVE

## 2019-08-07 LAB — GLUCOSE, CAPILLARY
Glucose-Capillary: 143 mg/dL — ABNORMAL HIGH (ref 70–99)
Glucose-Capillary: 223 mg/dL — ABNORMAL HIGH (ref 70–99)
Glucose-Capillary: 302 mg/dL — ABNORMAL HIGH (ref 70–99)
Glucose-Capillary: 239 mg/dL — ABNORMAL HIGH (ref 70–99)

## 2019-08-07 SURGERY — HYSTERECTOMY, VAGINAL, LAPAROSCOPY-ASSISTED, WITH SALPINGO-OOPHORECTOMY
Anesthesia: General | Site: Abdomen | Laterality: Bilateral

## 2019-08-07 MED ORDER — SODIUM CHLORIDE 0.9 % IR SOLN
Status: DC | PRN
  Administered 2019-08-07: 3000 mL

## 2019-08-07 MED ORDER — ACETAMINOPHEN 10 MG/ML IV SOLN
INTRAVENOUS | Status: AC
  Filled 2019-08-07: qty 100

## 2019-08-07 MED ORDER — ONDANSETRON HCL 4 MG/2ML IJ SOLN: 4.0000 mg | Freq: Once | INTRAMUSCULAR | Status: DC | PRN

## 2019-08-07 MED ORDER — IBUPROFEN 600 MG PO TABS: 600.0000 mg | ORAL_TABLET | Freq: Four times a day (QID) | ORAL | Status: DC | PRN

## 2019-08-07 MED ORDER — BUPIVACAINE HCL (PF) 0.5 % IJ SOLN
INTRAMUSCULAR | Status: DC | PRN
  Administered 2019-08-07: 25 mL
  Administered 2019-08-07: 5 mL

## 2019-08-07 MED ORDER — ONDANSETRON HCL 4 MG/2ML IJ SOLN
INTRAMUSCULAR | Status: DC | PRN
  Administered 2019-08-07: 4 mg via INTRAVENOUS

## 2019-08-07 MED ORDER — BUPIVACAINE HCL (PF) 0.5 % IJ SOLN
INTRAMUSCULAR | Status: AC
  Filled 2019-08-07: qty 30

## 2019-08-07 MED ORDER — LACTATED RINGERS IV SOLN
INTRAVENOUS | Status: DC | PRN
  Administered 2019-08-07 (×2): via INTRAVENOUS

## 2019-08-07 MED ORDER — FENTANYL CITRATE (PF) 250 MCG/5ML IJ SOLN
INTRAMUSCULAR | Status: DC | PRN
  Administered 2019-08-07: 100 ug via INTRAVENOUS
  Administered 2019-08-07 (×3): 50 ug via INTRAVENOUS

## 2019-08-07 MED ORDER — SOD CITRATE-CITRIC ACID 500-334 MG/5ML PO SOLN
30.0000 mL | ORAL | Status: DC
  Filled 2019-08-07: qty 30

## 2019-08-07 MED ORDER — ONDANSETRON HCL 4 MG PO TABS: 4.0000 mg | ORAL_TABLET | Freq: Four times a day (QID) | ORAL | Status: DC | PRN

## 2019-08-07 MED ORDER — GLYCOPYRROLATE PF 0.2 MG/ML IJ SOSY
PREFILLED_SYRINGE | INTRAMUSCULAR | Status: DC | PRN
  Administered 2019-08-07: .2 mg via INTRAVENOUS

## 2019-08-07 MED ORDER — OXYCODONE HCL 5 MG PO TABS
5.0000 mg | ORAL_TABLET | ORAL | Status: DC | PRN
  Administered 2019-08-07 (×2): 10 mg via ORAL
  Filled 2019-08-07: qty 2

## 2019-08-07 MED ORDER — DIPHENHYDRAMINE HCL 50 MG/ML IJ SOLN
INTRAMUSCULAR | Status: DC | PRN
  Administered 2019-08-07: 12.5 mg via INTRAVENOUS

## 2019-08-07 MED ORDER — MAGNESIUM HYDROXIDE 400 MG/5ML PO SUSP: 30.0000 mL | Freq: Every day | ORAL | Status: DC | PRN

## 2019-08-07 MED ORDER — AMLODIPINE BESYLATE 10 MG PO TABS
10.0000 mg | ORAL_TABLET | Freq: Every day | ORAL | Status: DC
  Administered 2019-08-08: 10:00:00 10 mg via ORAL
  Filled 2019-08-07: qty 1

## 2019-08-07 MED ORDER — ONDANSETRON HCL 4 MG/2ML IJ SOLN: 4.0000 mg | Freq: Four times a day (QID) | INTRAMUSCULAR | Status: DC | PRN

## 2019-08-07 MED ORDER — HYDROMORPHONE HCL 1 MG/ML IJ SOLN
INTRAMUSCULAR | Status: AC
  Filled 2019-08-07: qty 1

## 2019-08-07 MED ORDER — LIDOCAINE 2% (20 MG/ML) 5 ML SYRINGE
INTRAMUSCULAR | Status: AC
  Filled 2019-08-07: qty 5

## 2019-08-07 MED ORDER — SODIUM CHLORIDE (PF) 0.9 % IJ SOLN
INTRAMUSCULAR | Status: AC
  Filled 2019-08-07: qty 10

## 2019-08-07 MED ORDER — ONDANSETRON HCL 4 MG/2ML IJ SOLN
INTRAMUSCULAR | Status: AC
  Filled 2019-08-07: qty 2

## 2019-08-07 MED ORDER — ACETAMINOPHEN 10 MG/ML IV SOLN
INTRAVENOUS | Status: DC | PRN
  Administered 2019-08-07: 1000 mg via INTRAVENOUS

## 2019-08-07 MED ORDER — ESCITALOPRAM OXALATE 20 MG PO TABS
20.0000 mg | ORAL_TABLET | Freq: Every day | ORAL | Status: DC
  Administered 2019-08-08: 10:00:00 20 mg via ORAL
  Filled 2019-08-07: qty 1

## 2019-08-07 MED ORDER — LACTATED RINGERS IV SOLN: INTRAVENOUS | Status: DC

## 2019-08-07 MED ORDER — SUGAMMADEX SODIUM 200 MG/2ML IV SOLN
INTRAVENOUS | Status: DC | PRN
  Administered 2019-08-07: 175 mg via INTRAVENOUS

## 2019-08-07 MED ORDER — PROPOFOL 10 MG/ML IV BOLUS
INTRAVENOUS | Status: DC | PRN
  Administered 2019-08-07: 150 mg via INTRAVENOUS

## 2019-08-07 MED ORDER — LIDOCAINE 2% (20 MG/ML) 5 ML SYRINGE
INTRAMUSCULAR | Status: DC | PRN
  Administered 2019-08-07: 60 mg via INTRAVENOUS

## 2019-08-07 MED ORDER — HYDROCORTISONE 1 % EX CREA
TOPICAL_CREAM | Freq: Four times a day (QID) | CUTANEOUS | Status: DC | PRN
  Filled 2019-08-07: qty 28

## 2019-08-07 MED ORDER — HYDROCHLOROTHIAZIDE 25 MG PO TABS
12.5000 mg | ORAL_TABLET | Freq: Every day | ORAL | Status: DC
  Administered 2019-08-07 – 2019-08-08 (×2): 12.5 mg via ORAL
  Filled 2019-08-07 (×2): qty 1

## 2019-08-07 MED ORDER — PANTOPRAZOLE SODIUM 40 MG PO TBEC
40.0000 mg | DELAYED_RELEASE_TABLET | Freq: Every day | ORAL | Status: DC
  Administered 2019-08-07 – 2019-08-08 (×2): 40 mg via ORAL
  Filled 2019-08-07 (×2): qty 1

## 2019-08-07 MED ORDER — ACETAMINOPHEN 500 MG PO TABS
1000.0000 mg | ORAL_TABLET | Freq: Four times a day (QID) | ORAL | Status: DC
  Administered 2019-08-07 – 2019-08-08 (×4): 1000 mg via ORAL
  Filled 2019-08-07 (×4): qty 2

## 2019-08-07 MED ORDER — INSULIN ASPART 100 UNIT/ML ~~LOC~~ SOLN
0.0000 [IU] | Freq: Every day | SUBCUTANEOUS | Status: DC
  Administered 2019-08-07: 21:00:00 2 [IU] via SUBCUTANEOUS

## 2019-08-07 MED ORDER — HYDROMORPHONE HCL 1 MG/ML IJ SOLN: 1.0000 mg | INTRAMUSCULAR | Status: DC | PRN

## 2019-08-07 MED ORDER — ROCURONIUM BROMIDE 10 MG/ML (PF) SYRINGE
PREFILLED_SYRINGE | INTRAVENOUS | Status: AC
  Filled 2019-08-07: qty 10

## 2019-08-07 MED ORDER — SODIUM CHLORIDE 0.9 % IV SOLN
INTRAVENOUS | Status: DC | PRN
  Administered 2019-08-07: 08:00:00 25 ug/min via INTRAVENOUS

## 2019-08-07 MED ORDER — MEPERIDINE HCL 25 MG/ML IJ SOLN: 6.2500 mg | INTRAMUSCULAR | Status: DC | PRN

## 2019-08-07 MED ORDER — FENTANYL CITRATE (PF) 100 MCG/2ML IJ SOLN: 12.5000 ug | INTRAMUSCULAR | Status: DC | PRN

## 2019-08-07 MED ORDER — MENTHOL 3 MG MT LOZG: 1.0000 | LOZENGE | OROMUCOSAL | Status: DC | PRN

## 2019-08-07 MED ORDER — MIDAZOLAM HCL 2 MG/2ML IJ SOLN
INTRAMUSCULAR | Status: AC
  Filled 2019-08-07: qty 2

## 2019-08-07 MED ORDER — FENTANYL CITRATE (PF) 250 MCG/5ML IJ SOLN
INTRAMUSCULAR | Status: AC
  Filled 2019-08-07: qty 5

## 2019-08-07 MED ORDER — SODIUM CHLORIDE (PF) 0.9 % IJ SOLN
INTRAMUSCULAR | Status: DC | PRN
  Administered 2019-08-07: 10 mL via INTRAVENOUS

## 2019-08-07 MED ORDER — SIMETHICONE 80 MG PO CHEW: 80.0000 mg | CHEWABLE_TABLET | Freq: Four times a day (QID) | ORAL | Status: DC | PRN

## 2019-08-07 MED ORDER — HYDROMORPHONE HCL 1 MG/ML IJ SOLN
0.2500 mg | INTRAMUSCULAR | Status: DC | PRN
  Administered 2019-08-07: 0.5 mg via INTRAVENOUS

## 2019-08-07 MED ORDER — OXYCODONE HCL 5 MG PO TABS
ORAL_TABLET | ORAL | Status: AC
  Filled 2019-08-07: qty 2

## 2019-08-07 MED ORDER — INSULIN GLARGINE 100 UNIT/ML ~~LOC~~ SOLN
60.0000 [IU] | Freq: Every day | SUBCUTANEOUS | Status: DC
  Administered 2019-08-07: 60 [IU] via SUBCUTANEOUS
  Filled 2019-08-07 (×2): qty 0.6

## 2019-08-07 MED ORDER — INSULIN ASPART 100 UNIT/ML ~~LOC~~ SOLN
15.0000 [IU] | Freq: Three times a day (TID) | SUBCUTANEOUS | Status: DC
  Administered 2019-08-07 – 2019-08-08 (×2): 15 [IU] via SUBCUTANEOUS

## 2019-08-07 MED ORDER — INSULIN ASPART 100 UNIT/ML ~~LOC~~ SOLN
0.0000 [IU] | Freq: Three times a day (TID) | SUBCUTANEOUS | Status: DC
  Administered 2019-08-07: 18:00:00 11 [IU] via SUBCUTANEOUS

## 2019-08-07 MED ORDER — ROCURONIUM BROMIDE 10 MG/ML (PF) SYRINGE
PREFILLED_SYRINGE | INTRAVENOUS | Status: DC | PRN
  Administered 2019-08-07: 50 mg via INTRAVENOUS
  Administered 2019-08-07: 20 mg via INTRAVENOUS

## 2019-08-07 MED ORDER — DIPHENHYDRAMINE HCL 50 MG/ML IJ SOLN
INTRAMUSCULAR | Status: AC
  Filled 2019-08-07: qty 1

## 2019-08-07 MED ORDER — LACTATED RINGERS IV SOLN
INTRAVENOUS | Status: DC
  Administered 2019-08-07 – 2019-08-08 (×2): via INTRAVENOUS

## 2019-08-07 MED ORDER — DEXAMETHASONE SODIUM PHOSPHATE 10 MG/ML IJ SOLN
INTRAMUSCULAR | Status: AC
  Filled 2019-08-07: qty 1

## 2019-08-07 MED ORDER — MIDAZOLAM HCL 2 MG/2ML IJ SOLN
INTRAMUSCULAR | Status: DC | PRN
  Administered 2019-08-07: 2 mg via INTRAVENOUS

## 2019-08-07 MED ORDER — PROPOFOL 10 MG/ML IV BOLUS
INTRAVENOUS | Status: AC
  Filled 2019-08-07: qty 40

## 2019-08-07 MED ORDER — DEXAMETHASONE SODIUM PHOSPHATE 10 MG/ML IJ SOLN
INTRAMUSCULAR | Status: DC | PRN
  Administered 2019-08-07: 5 mg via INTRAVENOUS

## 2019-08-07 MED ORDER — CEFAZOLIN SODIUM-DEXTROSE 2-4 GM/100ML-% IV SOLN
2.0000 g | INTRAVENOUS | Status: AC
  Administered 2019-08-07: 2 g via INTRAVENOUS
  Filled 2019-08-07: qty 100

## 2019-08-07 SURGICAL SUPPLY — 52 items
ADH SKN CLS APL DERMABOND .7 (GAUZE/BANDAGES/DRESSINGS) ×1
CABLE HIGH FREQUENCY MONO STRZ (ELECTRODE) IMPLANT
CATH ROBINSON RED A/P 16FR (CATHETERS) ×2 IMPLANT
COVER BACK TABLE 60X90IN (DRAPES) ×2 IMPLANT
COVER MAYO STAND STRL (DRAPES) ×2 IMPLANT
DECANTER SPIKE VIAL GLASS SM (MISCELLANEOUS) ×1 IMPLANT
DERMABOND ADVANCED (GAUZE/BANDAGES/DRESSINGS) ×1
DERMABOND ADVANCED .7 DNX12 (GAUZE/BANDAGES/DRESSINGS) ×1 IMPLANT
DRSG OPSITE POSTOP 3X4 (GAUZE/BANDAGES/DRESSINGS) ×2 IMPLANT
DURAPREP 26ML APPLICATOR (WOUND CARE) ×2 IMPLANT
ELECT REM PT RETURN 9FT ADLT (ELECTROSURGICAL) ×2
ELECTRODE REM PT RTRN 9FT ADLT (ELECTROSURGICAL) IMPLANT
FILTER SMOKE EVAC LAPAROSHD (FILTER) ×2 IMPLANT
GLOVE BIO SURGEON STRL SZ7.5 (GLOVE) ×4 IMPLANT
GLOVE BIOGEL PI IND STRL 6.5 (GLOVE) ×1 IMPLANT
GLOVE BIOGEL PI IND STRL 7.0 (GLOVE) ×2 IMPLANT
GLOVE BIOGEL PI IND STRL 8 (GLOVE) ×1 IMPLANT
GLOVE BIOGEL PI INDICATOR 6.5 (GLOVE) ×1
GLOVE BIOGEL PI INDICATOR 7.0 (GLOVE) ×2
GLOVE BIOGEL PI INDICATOR 8 (GLOVE) ×1
GOWN STRL REUS W/ TWL XL LVL3 (GOWN DISPOSABLE) ×2 IMPLANT
GOWN STRL REUS W/TWL XL LVL3 (GOWN DISPOSABLE) ×4
KIT TURNOVER KIT B (KITS) ×2 IMPLANT
LEGGING LITHOTOMY PAIR STRL (DRAPES) ×2 IMPLANT
LIGASURE IMPACT 36 18CM CVD LR (INSTRUMENTS) ×1 IMPLANT
NDL INSUFFLATION 14GA 120MM (NEEDLE) ×1 IMPLANT
NEEDLE INSUFFLATION 14GA 120MM (NEEDLE) ×2 IMPLANT
NS IRRIG 1000ML POUR BTL (IV SOLUTION) ×2 IMPLANT
PACK LAVH (CUSTOM PROCEDURE TRAY) ×2 IMPLANT
PACK TRENDGUARD 450 HYBRID PRO (MISCELLANEOUS) IMPLANT
PROTECTOR NERVE ULNAR (MISCELLANEOUS) ×4 IMPLANT
SCISSORS LAP 5X45 EPIX DISP (ENDOMECHANICALS) IMPLANT
SEALER TISSUE G2 CVD JAW 45CM (ENDOMECHANICALS) ×2 IMPLANT
SET CYSTO W/LG BORE CLAMP LF (SET/KITS/TRAYS/PACK) IMPLANT
SET IRRIG TUBING LAPAROSCOPIC (IRRIGATION / IRRIGATOR) ×1 IMPLANT
SET TUBE SMOKE EVAC HIGH FLOW (TUBING) ×2 IMPLANT
SLEEVE ENDOPATH XCEL 5M (ENDOMECHANICALS) IMPLANT
SPECIMEN JAR MEDIUM (MISCELLANEOUS) ×2 IMPLANT
SUT MNCRL 0 MO-4 VIOLET 18 CR (SUTURE) ×1 IMPLANT
SUT MNCRL 0 VIOLET 6X18 (SUTURE) ×1 IMPLANT
SUT MONOCRYL 0 6X18 (SUTURE) ×1
SUT MONOCRYL 0 MO 4 18  CR/8 (SUTURE) ×1
SUT VICRYL 0 UR6 27IN ABS (SUTURE) ×4 IMPLANT
SUT VICRYL 4-0 PS2 18IN ABS (SUTURE) ×2 IMPLANT
SYR 30ML LL (SYRINGE) ×2 IMPLANT
TOWEL GREEN STERILE FF (TOWEL DISPOSABLE) ×4 IMPLANT
TRAY FOLEY W/BAG SLVR 14FR (SET/KITS/TRAYS/PACK) ×2 IMPLANT
TRENDGUARD 450 HYBRID PRO PACK (MISCELLANEOUS)
TROCAR XCEL NON-BLD 11X100MML (ENDOMECHANICALS) ×2 IMPLANT
TROCAR XCEL NON-BLD 5MMX100MML (ENDOMECHANICALS) ×2 IMPLANT
UNDERPAD 30X30 (UNDERPADS AND DIAPERS) ×2 IMPLANT
WARMER LAPAROSCOPE (MISCELLANEOUS) ×2 IMPLANT

## 2019-08-07 NOTE — Progress Notes (Signed)
No changes to H&P per patient history Reviewed procedure - LAVH/BSO Patient states she understands and agrees

## 2019-08-07 NOTE — Plan of Care (Signed)
  Problem: Health Behavior/Discharge Planning: Goal: Ability to manage health-related needs will improve Outcome: Progressing   

## 2019-08-07 NOTE — Anesthesia Procedure Notes (Signed)
Procedure Name: Intubation Date/Time: 08/07/2019 7:22 AM Performed by: Alain Marion, CRNA Pre-anesthesia Checklist: Patient identified, Emergency Drugs available, Suction available and Patient being monitored Patient Re-evaluated:Patient Re-evaluated prior to induction Oxygen Delivery Method: Circle System Utilized Preoxygenation: Pre-oxygenation with 100% oxygen Induction Type: IV induction Ventilation: Mask ventilation without difficulty and Oral airway inserted - appropriate to patient size Laryngoscope Size: Miller and 2 Tube type: Oral Tube size: 7.0 mm Number of attempts: 1 Airway Equipment and Method: Stylet and Oral airway Placement Confirmation: ETT inserted through vocal cords under direct vision,  positive ETCO2 and breath sounds checked- equal and bilateral Secured at: 21 cm Tube secured with: Tape Dental Injury: Teeth and Oropharynx as per pre-operative assessment

## 2019-08-07 NOTE — Progress Notes (Signed)
Foley out, no void yet Ambulating well, good pain control, tolerating diet  Today's Vitals   08/07/19 1325 08/07/19 1334 08/07/19 1350 08/07/19 1429  BP: (!) 119/54  123/61   Pulse: (!) 103  98   Resp: 14  17   Temp:   98.8 F (37.1 C)   TempSrc:   Oral   SpO2: 97%  93%   Weight:      Height:      PainSc:  4   3    Body mass index is 35.26 kg/m.  Abdomen soft  A/P: per orders         Appreciate input from diabetes coordinator         Hydrocortisone cream prn to back

## 2019-08-07 NOTE — Progress Notes (Signed)
Inpatient Diabetes Program Recommendations  AACE/ADA: New Consensus Statement on Inpatient Glycemic Control (2015)  Target Ranges:  Prepandial:   less than 140 mg/dL      Peak postprandial:   less than 180 mg/dL (1-2 hours)      Critically ill patients:  140 - 180 mg/dL   Lab Results  Component Value Date   GLUCAP 223 (H) 08/07/2019   HGBA1C 13.8 (H) 07/01/2019    Review of Glycemic Control  Diabetes history: DM 2 (sees Dr. Dwyane Dee) Outpatient Diabetes Medications: Lantus 60 units qhs, Novolog 20 units tid, Invokana 100 mg Daily  Current orders for Inpatient glycemic control:  Lantus 60 qhs Novolog 0-15 units tid Novolog 0-5 units qhs Novolog 15 units tid meal coverage  Decadron 5 mg Given during surgery  A1c 13.8% on 7/28, ordered updated A1c  Inpatient Diabetes Program Recommendations:    Spoke with patient at bedside regarding A1c level and glucose control. Patient has seen her Endocrinologist since visit with Dr. Gaetano Net. Patient was placed on Invokana and has modified her diet. Patient reports her fasting glucose is in the low 100 range and at the highest her glucose is in the low 200's, indicating an A1c between 8-9%.  Patient given Decadron 5 mg during surgery. Glucose check on presentation in the 140's seeming to be controlled at home/preop.  Spoke with Dr. Gaetano Net about plan of care. Resume patient's medications. Possible d/c tomorrow.  Thanks,  Tama Headings RN, MSN, BC-ADM Inpatient Diabetes Coordinator Team Pager [phone] (8a-5p)

## 2019-08-07 NOTE — Progress Notes (Signed)
Patient transferred to rm [phone], receiving RN at bedside, notified daughter of patients rm, no questions or concerns from pt or RN.  Rowe Pavy, RN

## 2019-08-07 NOTE — Anesthesia Postprocedure Evaluation (Signed)
Anesthesia Post Note  Patient: Holly Ortiz  Procedure(s) Performed: LAPAROSCOPIC ASSISTED VAGINAL HYSTERECTOMY WITH SALPINGO OOPHORECTOMY, possible abdominal hysterectomy (Bilateral Abdomen)     Patient location during evaluation: PACU Anesthesia Type: General Level of consciousness: awake and alert Pain management: pain level controlled Vital Signs Assessment: post-procedure vital signs reviewed and stable Respiratory status: spontaneous breathing, nonlabored ventilation, respiratory function stable and patient connected to nasal cannula oxygen Cardiovascular status: blood pressure returned to baseline and stable Postop Assessment: no apparent nausea or vomiting Anesthetic complications: no    Last Vitals:  Vitals:   08/07/19 1325 08/07/19 1350  BP: (!) 119/54 123/61  Pulse: (!) 103 98  Resp: 14 17  Temp:  37.1 C  SpO2: 97% 93%    Last Pain:  Vitals:   08/07/19 1429  TempSrc:   PainSc: 3                  Monserat Prestigiacomo DAVID

## 2019-08-07 NOTE — Progress Notes (Signed)
08/07/2019  9:20 AM  PATIENT:  Holly Ortiz  53 y.o. female  PRE-OPERATIVE DIAGNOSIS:  bilateral ovarian cysts, PMB  POST-OPERATIVE DIAGNOSIS:  bilateral ovarian cysts, PMB  PROCEDURE:  Procedure(s) with comments: LAPAROSCOPIC ASSISTED VAGINAL HYSTERECTOMY WITH SALPINGO OOPHORECTOMY, possible abdominal hysterectomy (Bilateral) - possible abdominal hysterectomy pt is diabetic and hypertensive  SURGEON:  Surgeon(s) and Role:    * Everlene Farrier, MD - Primary    * Maisie Fus, MD - Assisting  PHYSICIAN ASSISTANT:   ASSISTANTS: Evette Cristal MD   ANESTHESIA:   general  EBL:  200 mL   BLOOD ADMINISTERED:none  DRAINS: Urinary Catheter (Foley)   LOCAL MEDICATIONS USED:  MARCAINE    and Amount: 30 ml  SPECIMEN:  Source of Specimen:  uterus, bilateral tubes/ovaries  DISPOSITION OF SPECIMEN:  PATHOLOGY  COUNTS:  YES  TOURNIQUET:  * No tourniquets in log *  DICTATION: .Other Dictation: Dictation Number [id number]  PLAN OF CARE: Admit for overnight observation  PATIENT DISPOSITION:  PACU - hemodynamically stable.   Delay start of Pharmacological VTE agent (>24hrs) due to surgical blood loss or risk of bleeding: not applicable

## 2019-08-07 NOTE — Op Note (Signed)
Holly Ortiz, PERKOWSKI MEDICAL RECORD [id number] ACCOUNT [id number] DATE OF BIRTH:[birth year] FACILITY: [facility] LOCATION: [place], [place]  OPERATIVE REPORT  DATE OF PROCEDURE:  08/07/2019  PREOPERATIVE DIAGNOSES: 1.  Abnormal uterine bleeding. 2.  Bilateral ovarian cyst.  POSTOPERATIVE DIAGNOSES: 1.  Abnormal uterine bleeding. 2.  Bilateral ovarian cysts.  PROCEDURE:  Laparoscopically-assisted vaginal hysterectomy with bilateral salpingo-oophorectomy.  SURGEON:  Everlene Farrier II, MD  ASSISTANT:  Evette Cristal, MD  ANESTHESIA:  General with endotracheal intubation.  ESTIMATED BLOOD LOSS:  200 mL.  SPECIMENS:  Uterus, bilateral fallopian tubes, and ovaries to pathology.  INDICATIONS AND CONSENT:  This patient is a 53 year old patient with abnormal bleeding and small bilateral ovarian cysts suspicious for dermoid.  Details are dictated in the History and Physical.  Laparoscopically-assisted vaginal hysterectomy with  bilateral salpingo-oophorectomy has been discussed preoperatively.  Potential risks and complications were discussed preoperatively including but not limited to infection, organ damage, bleeding requiring transfusion of blood products with HIV and  hepatitis acquisition, DVT, PE, pneumonia, laparotomy, return to the operating room, and postoperative pain.  All questions have been answered, and consent was signed on the chart.  DESCRIPTION OF PROCEDURE:  The patient was taken to the operating room where she was identified, placed in the dorsal supine position, and general anesthesia was induced via endotracheal intubation.  She was placed in the dorsal lithotomy position.  She  was prepped vaginally with Betadine, abdominally with ChloraPrep.  Timeout was undertaken.  After 3-minute drying time, a Hulka tenaculum was placed in the uterus as a manipulator, and she was draped in a sterile fashion.  The infraumbilical and  suprapubic areas were  injected with approximately 8 mL of 0.5% plain Marcaine.  Infraumbilical incision was made, and the posterior leaf of the fascia was entered without difficulty.  Peritoneum was bluntly entered.  A 0 Vicryl was placed at the 3 and 9  o'clock position and the trocar sleeve was placed.  Inspection revealed proper placement and pneumoperitoneum was introduced.  A small suprapubic incision was made in the midline, and a 5 mm disposable trocar sleeve was placed under direct visualization  without difficulty.  The upper abdomen is normal.  In the pelvis, the uterus is 8 weeks in size, smooth in contour.  Right tube and ovary normal.  Left ovary is normal with an approximately 3 cm smooth translucent paratubal cyst distally.  Anterior and  posterior cul-de-sacs were normal.  EnSeal bipolar cautery cutting was used to take down the right infundibulopelvic ligament coming proximal across the proximal round ligament down to the level of vesicouterine peritoneum.  A similar procedure was  carried out on the left.  The vesicouterine peritoneum was taken down in the midline.  Good hemostasis was noted.  Instruments were removed.  The suprapubic trocar sleeve was removed, and attention was turned to the vagina.  Posterior cul-de-sac was  sharply entered, and the cervix was circumscribed with unipolar cautery.  Mucosa was advanced sharply and bluntly.  LigaSure bipolar cautery was then used to take down the uterosacral ligaments bilaterally, followed by the bladder pillars, cardinal  ligaments, and uterine vessels.  This allowed the fundus to be delivered posteriorly and the remaining peritoneum anteriorly to be taken down without difficulty.  Specimen was delivered.  All sutures would be 0 Vicryl unless otherwise designated.   Uterosacral ligaments were plicated to the cuff bilaterally and then plicated in the midline with a third suture.  Cuff was closed with figures-of-eight.  Foley  catheter was placed and clear urine was  noted.  Attention was returned to the abdomen.   Pneumoperitoneum was reintroduced, suprapubic trocar sleeve was reintroduced, and minor bleeding was noted.  Cautery was used to obtain complete hemostasis.  Arista was placed over the cuff.  The remaining approximately 22 mL of plain Marcaine 0.5% was  placed in the peritoneal cavity.  Trocar sleeves were removed.  Umbilical incision was closed using the Vicryl sutures at 3 and 9 o'clock position with good closure of the fascia.  The skin was closed on both incisions with interrupted 4-0 Vicryl.   Dermabond was placed.  Pressure dressing was applied.  All counts were correct.  The patient was awakened and taken to recovery room in stable condition.  LN/NUANCE  D:08/07/2019 T:08/07/2019 JOB:[id number]

## 2019-08-07 NOTE — Transfer of Care (Signed)
Immediate Anesthesia Transfer of Care Note  Patient: Holly Ortiz  Procedure(s) Performed: LAPAROSCOPIC ASSISTED VAGINAL HYSTERECTOMY WITH SALPINGO OOPHORECTOMY, possible abdominal hysterectomy (Bilateral Abdomen)  Patient Location: PACU  Anesthesia Type:General  Level of Consciousness: awake, alert  and oriented  Airway & Oxygen Therapy: Patient Spontanous Breathing and Patient connected to face mask oxygen  Post-op Assessment: Report given to RN and Post -op Vital signs reviewed and stable  Post vital signs: Reviewed and stable  Last Vitals:  Vitals Value Taken Time  BP 121/61 08/07/19 0937  Temp    Pulse 101 08/07/19 0944  Resp 17 08/07/19 0944  SpO2 100 % 08/07/19 0944  Vitals shown include unvalidated device data.  Last Pain:  Vitals:   08/07/19 0617  TempSrc: Oral  PainSc:          Complications: No apparent anesthesia complications

## 2019-08-08 ENCOUNTER — Encounter (HOSPITAL_COMMUNITY): Payer: Self-pay | Admitting: Obstetrics and Gynecology

## 2019-08-08 DIAGNOSIS — N939 Abnormal uterine and vaginal bleeding, unspecified: Secondary | ICD-10-CM | POA: Diagnosis not present

## 2019-08-08 LAB — CBC
HCT: 33 % — ABNORMAL LOW (ref 36.0–46.0)
Hemoglobin: 10.3 g/dL — ABNORMAL LOW (ref 12.0–15.0)
MCH: 25.9 pg — ABNORMAL LOW (ref 26.0–34.0)
MCHC: 31.2 g/dL (ref 30.0–36.0)
MCV: 82.9 fL (ref 80.0–100.0)
Platelets: 286 10*3/uL (ref 150–400)
RBC: 3.98 MIL/uL (ref 3.87–5.11)
RDW: 13.3 % (ref 11.5–15.5)
WBC: 9.7 10*3/uL (ref 4.0–10.5)
nRBC: 0 % (ref 0.0–0.2)

## 2019-08-08 LAB — GLUCOSE, CAPILLARY: Glucose-Capillary: 114 mg/dL — ABNORMAL HIGH (ref 70–99)

## 2019-08-08 LAB — HEMOGLOBIN A1C
Hgb A1c MFr Bld: 10 % — ABNORMAL HIGH (ref 4.8–5.6)
Mean Plasma Glucose: 240.3 mg/dL

## 2019-08-08 MED ORDER — ACETAMINOPHEN 500 MG PO TABS: 1000.0000 mg | ORAL_TABLET | Freq: Four times a day (QID) | ORAL | 0 refills | Status: AC | PRN

## 2019-08-08 MED ORDER — IBUPROFEN 600 MG PO TABS: 600.0000 mg | ORAL_TABLET | Freq: Four times a day (QID) | ORAL | 0 refills | Status: DC | PRN

## 2019-08-08 NOTE — Progress Notes (Signed)
Pt discharged home in stable condition after going over discharge teaching with no concerns voiced 

## 2019-08-08 NOTE — Progress Notes (Signed)
POD #1 Ambulating, tolerating regular diet, +flatus, voiding without difficulty, good pain relief  Today's Vitals   08/07/19 2124 08/07/19 2217 08/08/19 0212 08/08/19 0516  BP:   112/62 135/64  Pulse: 100  (!) 104 (!) 103  Resp:   18 18  Temp:   98.7 F (37.1 C) 98.9 F (37.2 C)  TempSrc:   Oral Oral  SpO2:   95% 94%  Weight:      Height:      PainSc:  Asleep     Body mass index is 35.26 kg/m.  Abdomen soft, incisions healing well  Results for orders placed or performed during the hospital encounter of 08/07/19 (from the past 24 hour(s))  Glucose, capillary     Status: Abnormal   Collection Time: 08/07/19  9:39 AM  Result Value Ref Range   Glucose-Capillary 223 (H) 70 - 99 mg/dL  Glucose, capillary     Status: Abnormal   Collection Time: 08/07/19  5:55 PM  Result Value Ref Range   Glucose-Capillary 302 (H) 70 - 99 mg/dL  Glucose, capillary     Status: Abnormal   Collection Time: 08/07/19  8:54 PM  Result Value Ref Range   Glucose-Capillary 239 (H) 70 - 99 mg/dL  CBC     Status: Abnormal   Collection Time: 08/08/19  3:44 AM  Result Value Ref Range   WBC 9.7 4.0 - 10.5 K/uL   RBC 3.98 3.87 - 5.11 MIL/uL   Hemoglobin 10.3 (L) 12.0 - 15.0 g/dL   HCT 33.0 (L) 36.0 - 46.0 %   MCV 82.9 80.0 - 100.0 fL   MCH 25.9 (L) 26.0 - 34.0 pg   MCHC 31.2 30.0 - 36.0 g/dL   RDW 13.3 11.5 - 15.5 %   Platelets 286 150 - 400 K/uL   nRBC 0.0 0.0 - 0.2 %  Hemoglobin A1c     Status: Abnormal   Collection Time: 08/08/19  3:44 AM  Result Value Ref Range   Hgb A1c MFr Bld 10.0 (H) 4.8 - 5.6 %   Mean Plasma Glucose 240.3 mg/dL  Glucose, capillary     Status: Abnormal   Collection Time: 08/08/19  8:09 AM  Result Value Ref Range   Glucose-Capillary 114 (H) 70 - 99 mg/dL   A/P: D/C home         Instructions reviewed         FU office 1-2 weeks         DM per endocrinology

## 2019-08-08 NOTE — Discharge Summary (Signed)
Physician Discharge Summary  Patient ID: Holly Ortiz MRN: [id number] DOB/AGE: 1966-07-11 53 y.o.  Admit date: 08/07/2019 Discharge date: 08/08/2019  Admission Diagnoses:menorrhagia  Discharge Diagnoses:  Active Problems:   Menorrhagia   Abnormal uterine bleeding   Discharged Condition: good  Hospital Course: taken to OR for LAVH/BSO. Had good resumption of bowel/bladder function with good pain control. Diabetes Coordinator consulted for diabetes management.  Consults: Diabetes Coordinator  Significant Diagnostic Studies: labs:  Results for orders placed or performed during the hospital encounter of 08/07/19 (from the past 24 hour(s))  Glucose, capillary     Status: Abnormal   Collection Time: 08/07/19  9:39 AM  Result Value Ref Range   Glucose-Capillary 223 (H) 70 - 99 mg/dL  Glucose, capillary     Status: Abnormal   Collection Time: 08/07/19  5:55 PM  Result Value Ref Range   Glucose-Capillary 302 (H) 70 - 99 mg/dL  Glucose, capillary     Status: Abnormal   Collection Time: 08/07/19  8:54 PM  Result Value Ref Range   Glucose-Capillary 239 (H) 70 - 99 mg/dL  CBC     Status: Abnormal   Collection Time: 08/08/19  3:44 AM  Result Value Ref Range   WBC 9.7 4.0 - 10.5 K/uL   RBC 3.98 3.87 - 5.11 MIL/uL   Hemoglobin 10.3 (L) 12.0 - 15.0 g/dL   HCT 33.0 (L) 36.0 - 46.0 %   MCV 82.9 80.0 - 100.0 fL   MCH 25.9 (L) 26.0 - 34.0 pg   MCHC 31.2 30.0 - 36.0 g/dL   RDW 13.3 11.5 - 15.5 %   Platelets 286 150 - 400 K/uL   nRBC 0.0 0.0 - 0.2 %  Hemoglobin A1c     Status: Abnormal   Collection Time: 08/08/19  3:44 AM  Result Value Ref Range   Hgb A1c MFr Bld 10.0 (H) 4.8 - 5.6 %   Mean Plasma Glucose 240.3 mg/dL  Glucose, capillary     Status: Abnormal   Collection Time: 08/08/19  8:09 AM  Result Value Ref Range   Glucose-Capillary 114 (H) 70 - 99 mg/dL    Treatments: surgery: LAVH/BSO  Discharge Exam: Blood pressure 135/64, pulse (!) 103, temperature 98.9 F (37.2 C),  temperature source Oral, resp. rate 18, height 5\' 2"  (1.575 m), weight 87.5 kg, SpO2 94 %. General appearance: alert, cooperative and no distress GI: soft, non-tender; bowel sounds normal; no masses,  no organomegaly  Disposition: Discharge disposition: 01-Home or Self Care       Discharge Instructions    Discharge patient   Complete by: As directed    Discharge disposition: 01-Home or Self Care   Discharge patient date: 08/08/2019     Allergies as of 08/08/2019      Reactions   Actos [pioglitazone Hydrochloride] Other (See Comments)   Wt gain   Lisinopril    Angioedema, including likely GI involvement   Metformin And Related Diarrhea, Nausea And Vomiting      Medication List    TAKE these medications   acetaminophen 500 MG tablet Commonly known as: TYLENOL Take 2 tablets (1,000 mg total) by mouth every 6 (six) hours as needed.   amLODipine 10 MG tablet Commonly known as: NORVASC Take 1 tablet (10 mg total) by mouth daily.   atorvastatin 20 MG tablet Commonly known as: Lipitor Take 1 tablet (20 mg total) by mouth daily.   Basaglar KwikPen 100 UNIT/ML Sopn INJECT 60 UNITS SUBCUTANEOUSLY ONCE DAILY   BD Pen Needle  Nano 2nd Gen 32G X 4 MM Misc Generic drug: Insulin Pen Needle Use four times daily to inject insulin.   canagliflozin 100 MG Tabs tablet Commonly known as: Invokana 1 tablet before breakfast   escitalopram 20 MG tablet Commonly known as: LEXAPRO Take 1 tablet (20 mg total) by mouth daily.   hydrochlorothiazide 12.5 MG tablet Commonly known as: HYDRODIURIL Take 1 tablet (12.5 mg total) by mouth daily. **NEED TO ESTABLISH WITH NEW PROVIDER**   ibuprofen 600 MG tablet Commonly known as: ADVIL Take 1 tablet (600 mg total) by mouth every 6 (six) hours as needed for moderate pain.   Melatonin 5 MG Tabs Take 5 mg by mouth at bedtime.   montelukast 10 MG tablet Commonly known as: SINGULAIR Take 1 tablet (10 mg total) by mouth at bedtime.   NovoLOG  FlexPen 100 UNIT/ML FlexPen Generic drug: insulin aspart Inject 20 units under the skin three times daily before meals.   vitamin B-12 1000 MCG tablet Commonly known as: CYANOCOBALAMIN Take 1,000 mcg by mouth daily.   Vitamin D 50 MCG (2000 UT) Caps Take 2,000 Units by mouth daily.   zinc gluconate 50 MG tablet Take 50 mg by mouth daily.        Signed: Shon Millet II 08/08/2019, 8:32 AM

## 2019-08-25 ENCOUNTER — Other Ambulatory Visit: Payer: Self-pay | Admitting: Family

## 2019-08-25 DIAGNOSIS — L509 Urticaria, unspecified: Secondary | ICD-10-CM

## 2019-08-25 DIAGNOSIS — F419 Anxiety disorder, unspecified: Secondary | ICD-10-CM

## 2019-08-25 DIAGNOSIS — F329 Major depressive disorder, single episode, unspecified: Secondary | ICD-10-CM

## 2019-08-25 DIAGNOSIS — F32A Depression, unspecified: Secondary | ICD-10-CM

## 2019-08-25 DIAGNOSIS — I10 Essential (primary) hypertension: Secondary | ICD-10-CM

## 2019-08-25 MED ORDER — ESCITALOPRAM OXALATE 20 MG PO TABS: 20.0000 mg | ORAL_TABLET | Freq: Every day | ORAL | 0 refills | Status: DC

## 2019-08-25 MED ORDER — AMLODIPINE BESYLATE 10 MG PO TABS: 10.0000 mg | ORAL_TABLET | Freq: Every day | ORAL | 0 refills | Status: DC

## 2019-08-25 MED ORDER — MONTELUKAST SODIUM 10 MG PO TABS: 10.0000 mg | ORAL_TABLET | Freq: Every day | ORAL | 0 refills | Status: DC

## 2019-09-01 ENCOUNTER — Other Ambulatory Visit: Payer: Self-pay

## 2019-09-01 ENCOUNTER — Other Ambulatory Visit (INDEPENDENT_AMBULATORY_CARE_PROVIDER_SITE_OTHER): Payer: 59

## 2019-09-01 DIAGNOSIS — E1165 Type 2 diabetes mellitus with hyperglycemia: Secondary | ICD-10-CM | POA: Diagnosis not present

## 2019-09-01 DIAGNOSIS — Z794 Long term (current) use of insulin: Secondary | ICD-10-CM

## 2019-09-01 LAB — COMPREHENSIVE METABOLIC PANEL
ALT: 17 U/L (ref 0–35)
AST: 13 U/L (ref 0–37)
Albumin: 4.1 g/dL (ref 3.5–5.2)
Alkaline Phosphatase: 60 U/L (ref 39–117)
BUN: 19 mg/dL (ref 6–23)
CO2: 30 mEq/L (ref 19–32)
Calcium: 9.9 mg/dL (ref 8.4–10.5)
Chloride: 100 mEq/L (ref 96–112)
Creatinine, Ser: 0.49 mg/dL (ref 0.40–1.20)
GFR: 132.29 mL/min (ref >60.00)
Glucose, Bld: 201 mg/dL — ABNORMAL HIGH (ref 70–99)
Potassium: 3.9 mEq/L (ref 3.5–5.1)
Sodium: 140 mEq/L (ref 135–145)
Total Bilirubin: 0.4 mg/dL (ref 0.2–1.2)
Total Protein: 7.7 g/dL (ref 6.0–8.3)

## 2019-09-01 LAB — HEMOGLOBIN A1C: Hgb A1c MFr Bld: 9.6 % — ABNORMAL HIGH (ref 4.6–6.5)

## 2019-09-05 ENCOUNTER — Encounter: Payer: Self-pay | Admitting: Endocrinology

## 2019-09-05 ENCOUNTER — Other Ambulatory Visit: Payer: Self-pay

## 2019-09-05 ENCOUNTER — Ambulatory Visit: Payer: 59 | Admitting: Endocrinology

## 2019-09-05 VITALS — BP 122/68 | HR 82 | Ht 62.0 in | Wt 197.2 lb

## 2019-09-05 DIAGNOSIS — Z23 Encounter for immunization: Secondary | ICD-10-CM

## 2019-09-05 DIAGNOSIS — E782 Mixed hyperlipidemia: Secondary | ICD-10-CM

## 2019-09-05 DIAGNOSIS — I10 Essential (primary) hypertension: Secondary | ICD-10-CM

## 2019-09-05 DIAGNOSIS — Z794 Long term (current) use of insulin: Secondary | ICD-10-CM

## 2019-09-05 DIAGNOSIS — E1165 Type 2 diabetes mellitus with hyperglycemia: Secondary | ICD-10-CM | POA: Diagnosis not present

## 2019-09-05 LAB — GLUCOSE, POCT (MANUAL RESULT ENTRY): POC Glucose: 188 mg/dl — AB (ref 70–99)

## 2019-09-05 NOTE — Progress Notes (Signed)
Patient ID: Holly Ortiz, female   DOB: August 23, 1966, 53 y.o.   MRN: [id number]          Reason for Appointment: Follow-up for Type 2 Diabetes     History of Present Illness:          Date of diagnosis of type 2 diabetes mellitus:   At age 36      Background history:  Her diabetes was diagnosed incidentally when she was having unrelated problems She took various oral agents including metformin for several years About 5 years ago she was started on insulin and she apparently was taking only one kind of insulin possibly a premixed insulin twice a day She said that her blood sugars have been usually poorly controlled with A1c usually 8-9% She was also taking metformin, Amaryl and Januvia prior to moving here from [place]  Recent history:    INSULIN regimen is: Basaglar 60 units pm  daily, Humalog 20 units before meals   Non-insulin hypoglycemic drugs the patient is taking are: Invokana 100 mg daily  Current management, blood sugar patterns and problems identified:  Her A1c was 13.8 in July and is now 9.6  She still has not checked to see if she can get a brand-name blood sugar meter from her insurance and is still using the [employer] meter which she did not bring  She did not keep a record of her blood sugars either  Despite asking her to check readings after meals consistently she is mostly doing blood sugars in the mornings and sometimes after dinner  She is concerned about her 5 pound weight gain  However HIGHEST blood sugars appear to be after dinner, reportedly up over 200  She has been less active after her gynecological surgery  She was told to increase her BASAGLAR by 4 units but she did not do so  However her fasting readings appear to be still fluctuating between 101 and 170  She does not think she has high readings in the afternoons when she checks them  She also thinks that she does better with her meal planning when she is at work and she is currently not  back as yet        Side effects from medications have been: Hives from [place]  Compliance with the medical regimen: Improved      Exercise: None   Glucose monitoring:  done 1-3 times a day         Glucometer: [employer] brand       Blood Glucose readings from recall as above  Previous readings:   PRE-MEAL Fasting Lunch Dinner Bedtime Overall  Glucose range:  129-271  108-142  133-154    Mean/median:     ?   POST-MEAL PC Breakfast PC Lunch PC Dinner  Glucose range:   233  205  Mean/median:        Dietician visit, most recent: A few years ago in [place]  Weight history:  Wt Readings from Last 3 Encounters:  09/05/19 197 lb 3.2 oz (89.4 kg)  08/07/19 192 lb 12.8 oz (87.5 kg)  07/30/19 192 lb 12.8 oz (87.5 kg)    Glycemic control:   Lab Results  Component Value Date   HGBA1C 9.6 (H) 09/01/2019   HGBA1C 10.0 (H) 08/08/2019   HGBA1C 13.8 (H) 07/01/2019   Lab Results  Component Value Date   LDLCALC 71 07/21/2019   CREATININE 0.49 09/01/2019   No results found for: [facility]  Lab Results  Component  Value Date   FRUCTOSAMINE 325 (H) 07/21/2019    Office Visit on 09/05/2019  Component Date Value Ref Range Status  . POC Glucose 09/05/2019 188* 70 - 99 mg/dl Final  Lab on 09/01/2019  Component Date Value Ref Range Status  . Sodium 09/01/2019 140  135 - 145 mEq/L Final  . Potassium 09/01/2019 3.9  3.5 - 5.1 mEq/L Final  . Chloride 09/01/2019 100  96 - 112 mEq/L Final  . CO2 09/01/2019 30  19 - 32 mEq/L Final  . Glucose, Bld 09/01/2019 201* 70 - 99 mg/dL Final  . BUN 09/01/2019 19  6 - 23 mg/dL Final  . Creatinine, Ser 09/01/2019 0.49  0.40 - 1.20 mg/dL Final  . Total Bilirubin 09/01/2019 0.4  0.2 - 1.2 mg/dL Final  . Alkaline Phosphatase 09/01/2019 60  39 - 117 U/L Final  . AST 09/01/2019 13  0 - 37 U/L Final  . ALT 09/01/2019 17  0 - 35 U/L Final  . Total Protein 09/01/2019 7.7  6.0 - 8.3 g/dL Final  . Albumin 09/01/2019 4.1  3.5 - 5.2 g/dL Final  .  Calcium 09/01/2019 9.9  8.4 - 10.5 mg/dL Final  . GFR 09/01/2019 132.29  >60.00 mL/min Final  . Hgb A1c MFr Bld 09/01/2019 9.6* 4.6 - 6.5 % Final   Glycemic Control Guidelines for People with Diabetes:Non Diabetic:  <6%Goal of Therapy: <7%Additional Action Suggested:  >8%     Allergies as of 09/05/2019      Reactions   Actos [pioglitazone Hydrochloride] Other (See Comments)   Wt gain   Lisinopril    Angioedema, including likely GI involvement   Metformin And Related Diarrhea, Nausea And Vomiting      Medication List       Accurate as of September 05, 2019 11:54 AM. If you have any questions, ask your nurse or doctor.        acetaminophen 500 MG tablet Commonly known as: TYLENOL Take 2 tablets (1,000 mg total) by mouth every 6 (six) hours as needed.   amLODipine 10 MG tablet Commonly known as: NORVASC Take 1 tablet (10 mg total) by mouth daily.   atorvastatin 20 MG tablet Commonly known as: Lipitor Take 1 tablet (20 mg total) by mouth daily.   Basaglar KwikPen 100 UNIT/ML Sopn INJECT 60 UNITS SUBCUTANEOUSLY ONCE DAILY   BD Pen Needle Nano 2nd Gen 32G X 4 MM Misc Generic drug: Insulin Pen Needle Use four times daily to inject insulin.   canagliflozin 100 MG Tabs tablet Commonly known as: Invokana 1 tablet before breakfast   escitalopram 20 MG tablet Commonly known as: LEXAPRO Take 1 tablet (20 mg total) by mouth daily.   hydrochlorothiazide 12.5 MG tablet Commonly known as: HYDRODIURIL Take 1 tablet (12.5 mg total) by mouth daily. **NEED TO ESTABLISH WITH NEW PROVIDER**   ibuprofen 600 MG tablet Commonly known as: ADVIL Take 1 tablet (600 mg total) by mouth every 6 (six) hours as needed for moderate pain.   Melatonin 5 MG Tabs Take 5 mg by mouth at bedtime.   montelukast 10 MG tablet Commonly known as: SINGULAIR Take 1 tablet (10 mg total) by mouth at bedtime.   NovoLOG FlexPen 100 UNIT/ML FlexPen Generic drug: insulin aspart Inject 20 units under the skin  three times daily before meals.   vitamin B-12 1000 MCG tablet Commonly known as: CYANOCOBALAMIN Take 1,000 mcg by mouth daily.   Vitamin D 50 MCG (2000 UT) Caps Take 2,000 Units by mouth daily.  zinc gluconate 50 MG tablet Take 50 mg by mouth daily.       Allergies:  Allergies  Allergen Reactions  . Actos [Pioglitazone Hydrochloride] Other (See Comments)    Wt gain  . Lisinopril     Angioedema, including likely GI involvement  . Metformin And Related Diarrhea and Nausea And Vomiting    Past Medical History:  Diagnosis Date  . Allergy   . 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-I)   . Anxiety   . Asthma   . Diabetes mellitus    type II  . Hyperlipidemia   . Hypertension   . Urticaria, chronic   . UTI (lower urinary tract infection)     Past Surgical History:  Procedure Laterality Date  . ABDOMINAL HYSTERECTOMY  08/07/2019  . breast lift  1993  . CATARACT EXTRACTION, BILATERAL Bilateral 2014  . LAPAROSCOPIC VAGINAL HYSTERECTOMY WITH SALPINGO OOPHORECTOMY Bilateral 08/07/2019   Procedure: LAPAROSCOPIC ASSISTED VAGINAL HYSTERECTOMY WITH SALPINGO OOPHORECTOMY, possible abdominal hysterectomy;  Surgeon: Everlene Farrier, MD;  Location: [facility];  Service: Gynecology;  Laterality: Bilateral;  possible abdominal hysterectomy pt is diabetic and hypertensive  . SOFT TISSUE MASS EXCISION Right 2016   [place]  . TUBAL LIGATION  1995  . tummy tuck  1993    Family History  Problem Relation Age of Onset  . Hyperlipidemia Other   . Heart disease Other   . Diabetes Father   . Hypertension Father   . COPD Father        Cause of death  . Colon cancer Mother   . Hypertension Mother   . Hypertension Sister   . Diabetes Sister   . Hypertension Brother   . Cancer Sister        Brain glioblastoma  . Diabetes Sister     Social History:  reports that she has never smoked. She has never used smokeless tobacco. She reports that she does not drink  alcohol or use drugs.   Review of Systems   Lipid history: On Lipitor 20 mg from her PCP but the previous LDL was high She now has LDL of 71    Lab Results  Component Value Date   CHOL 138 07/21/2019   HDL 35.90 (L) 07/21/2019   LDLCALC 71 07/21/2019   LDLDIRECT 129.0 07/03/2018   TRIG 154.0 (H) 07/21/2019   CHOLHDL 4 07/21/2019           Hypertension: Has been present since age 81, currently on amlodipine and HCTZ 12.5mg . Her doses were not changed with starting Invokana and blood pressure is better  May have had angioedema with lisinopril   BP Readings from Last 3 Encounters:  09/05/19 122/68  08/08/19 135/64  07/30/19 (!) 144/75    Most recent eye exam was in 2017  Most recent foot exam: 9/19  Currently known complications of diabetes: Microalbuminuria as of 1/19  LABS:  Office Visit on 09/05/2019  Component Date Value Ref Range Status  . POC Glucose 09/05/2019 188* 70 - 99 mg/dl Final  Lab on 09/01/2019  Component Date Value Ref Range Status  . Sodium 09/01/2019 140  135 - 145 mEq/L Final  . Potassium 09/01/2019 3.9  3.5 - 5.1 mEq/L Final  . Chloride 09/01/2019 100  96 - 112 mEq/L Final  . CO2 09/01/2019 30  19 - 32 mEq/L Final  . Glucose, Bld 09/01/2019 201* 70 - 99 mg/dL Final  . BUN 09/01/2019 19  6 - 23 mg/dL Final  .  Creatinine, Ser 09/01/2019 0.49  0.40 - 1.20 mg/dL Final  . Total Bilirubin 09/01/2019 0.4  0.2 - 1.2 mg/dL Final  . Alkaline Phosphatase 09/01/2019 60  39 - 117 U/L Final  . AST 09/01/2019 13  0 - 37 U/L Final  . ALT 09/01/2019 17  0 - 35 U/L Final  . Total Protein 09/01/2019 7.7  6.0 - 8.3 g/dL Final  . Albumin 09/01/2019 4.1  3.5 - 5.2 g/dL Final  . Calcium 09/01/2019 9.9  8.4 - 10.5 mg/dL Final  . GFR 09/01/2019 132.29  >60.00 mL/min Final  . Hgb A1c MFr Bld 09/01/2019 9.6* 4.6 - 6.5 % Final   Glycemic Control Guidelines for People with Diabetes:Non Diabetic:  <6%Goal of Therapy: <7%Additional Action Suggested:  >8%      Physical Examination:  BP 122/68 (BP Location: Right Arm, Patient Position: Sitting, Cuff Size: Large)   Pulse 82   Ht 5\' 2"  (1.575 m)   Wt 197 lb 3.2 oz (89.4 kg)   SpO2 97%   BMI 36.07 kg/m       ASSESSMENT:  Diabetes type 2, with history of persistent hyperglycemia, insulin-dependent  See history of present illness for detailed discussion of current diabetes management, blood sugar patterns and problems identified  A1c is 9.6  Although blood sugars are generally better compared to readings in the past she is still not getting consistent control A1c is still high Lately has not been able to exercise since she has had her gynecological surgery Did not bring her monitor and not clear if her blood sugars are consistently controlled after meals Also likely why her fasting readings are variable with using Basaglar  Also she has made significant improvement in her diet She does not exercise Her weight is not improving but overall her average blood sugar is down significantly from before  She is compliant with her mealtime insulin regimen and does not appear to have consistently high readings after meals but amount of monitoring is limited to readings before meals especially breakfast  Also fasting readings are variable possibly because of using once a day Basaglar but also not adjusting her mealtime dose based on her portions Likely needs higher amount of insulin coverage at dinnertime  Hypertension: Very well controlled now especially with adding Invokana Needs follow-up microalbumin  HYPERLIPIDEMIA: controlled as of last visit  PLAN:   Invokana to be increased, she can try 200 mg with her current prescription and then 300 mg She will start walking for exercise when she is able to More blood sugar monitoring after breakfast and lunch She will decrease her suppertime coverage to 25 units and 30 if she still has high readings after dinner Discussed with family target of under  180 at least and preferably under 160 She will use 64 units of Basaglar and adjust further to keep her morning sugars below 130 We discussed the options of insulin pump therapy and for convenience and simplicity she may be able to use the Omni pod system.  Discussed how this works, explained her the advantages especially better control and lower insulin regimen as well as avoiding multiple injections as well as flexibility and lifestyle with this.  She will look into this and call us back if interested to order this and schedule training Discussed A1c target of 7 or under    Counseling time on subjects discussed in assessment and plan sections is over 50% of today's 25 minute visit   Patient Instructions  25-30 Humalog at dinner  and 64 Basaglar  Check Brand meter will Ins cover   Check blood sugars on waking up 5-7 days a week  Also check blood sugars about 2 hours after meals and do this after different meals by rotation  Recommended blood sugar levels on waking up are 90-130 and about 2 hours after meal is 130-160  Please bring your blood sugar monitor to each visit, thank you  Invokana 2 of 100mg  then 300mg     Elayne Snare 09/05/2019, 11:54 AM   Note: This office note was prepared with Dragon voice recognition system technology. Any transcriptional errors that result from this process are unintentional.

## 2019-09-05 NOTE — Patient Instructions (Addendum)
25-30 Humalog at dinner and 64 Basaglar  Check Brand meter will Ins cover   Check blood sugars on waking up 5-7 days a week  Also check blood sugars about 2 hours after meals and do this after different meals by rotation  Recommended blood sugar levels on waking up are 90-130 and about 2 hours after meal is 130-160  Please bring your blood sugar monitor to each visit, thank you  Invokana 2 of 100mg  then 300mg 

## 2019-09-15 ENCOUNTER — Other Ambulatory Visit: Payer: Self-pay

## 2019-09-15 MED ORDER — NOVOLOG FLEXPEN 100 UNIT/ML ~~LOC~~ SOPN: PEN_INJECTOR | SUBCUTANEOUS | 1 refills | Status: DC

## 2019-09-16 ENCOUNTER — Other Ambulatory Visit: Payer: Self-pay

## 2019-09-16 ENCOUNTER — Telehealth: Payer: Self-pay | Admitting: Endocrinology

## 2019-09-16 NOTE — Telephone Encounter (Signed)
Patient called to advise that the insulin aspart (NOVOLOG FLEXPEN) 100 UNIT/ML FlexPen  That was sent to the [facility] on [address] needs to be a 90 day supply per the pharmacy. Per patient please resend RX.   Call back number is [phone]

## 2019-09-16 NOTE — Telephone Encounter (Signed)
Called pt and informed her that a 90 day supply was sent to the pharmacy yesterday. Pt stated that the pharmacy told her yesterday that they did not have a current Rx on file. I called [facility] and clarified this. They did receive the Rx. Pharmacy tech stated that someone must have keyed something incorrectly, but they do have a 90 day Rx on file available to be filled. Pt was called and voicemail left for her with this info.

## 2019-10-16 ENCOUNTER — Other Ambulatory Visit: Payer: Self-pay

## 2019-10-16 MED ORDER — CANAGLIFLOZIN 100 MG PO TABS: ORAL_TABLET | ORAL | 1 refills | Status: DC

## 2019-11-06 ENCOUNTER — Other Ambulatory Visit: Payer: 59

## 2019-11-11 ENCOUNTER — Ambulatory Visit: Payer: 59 | Admitting: Endocrinology

## 2019-11-18 ENCOUNTER — Encounter: Payer: Managed Care, Other (non HMO) | Admitting: Family Medicine

## 2019-12-02 ENCOUNTER — Ambulatory Visit (INDEPENDENT_AMBULATORY_CARE_PROVIDER_SITE_OTHER): Payer: Managed Care, Other (non HMO) | Admitting: Family Medicine

## 2019-12-02 ENCOUNTER — Encounter: Payer: Self-pay | Admitting: Family Medicine

## 2019-12-02 ENCOUNTER — Other Ambulatory Visit: Payer: Self-pay

## 2019-12-02 VITALS — BP 120/72 | HR 101 | Temp 98.1°F | Ht 63.0 in | Wt 203.8 lb

## 2019-12-02 DIAGNOSIS — D649 Anemia, unspecified: Secondary | ICD-10-CM

## 2019-12-02 DIAGNOSIS — R42 Dizziness and giddiness: Secondary | ICD-10-CM

## 2019-12-02 LAB — URINALYSIS, ROUTINE W REFLEX MICROSCOPIC
Bilirubin Urine: NEGATIVE
Hgb urine dipstick: NEGATIVE
Ketones, ur: NEGATIVE
Leukocytes,Ua: NEGATIVE
Nitrite: NEGATIVE
RBC / HPF: NONE SEEN (ref 0–?)
Specific Gravity, Urine: 1.02 (ref 1.000–1.030)
Total Protein, Urine: NEGATIVE
Urine Glucose: >=1000 — AB
Urobilinogen, UA: 0.2 (ref 0.0–1.0)
pH: 6 (ref 5.0–8.0)

## 2019-12-02 LAB — CBC
HCT: 38.9 % (ref 36.0–46.0)
Hemoglobin: 12.4 g/dL (ref 12.0–15.0)
MCHC: 31.9 g/dL (ref 30.0–36.0)
MCV: 78.2 fl (ref 78.0–100.0)
Platelets: 348 10*3/uL (ref 150.0–400.0)
RBC: 4.97 Mil/uL (ref 3.87–5.11)
RDW: 15.2 % (ref 11.5–15.5)
WBC: 7.1 10*3/uL (ref 4.0–10.5)

## 2019-12-02 LAB — VITAMIN B12: Vitamin B-12: 275 pg/mL (ref 211–911)

## 2019-12-02 NOTE — Progress Notes (Addendum)
Established Patient Office Visit  Subjective:  Patient ID: Holly Ortiz, female    DOB: [birth year]  Age: 53 y.o. MRN: [id number]  CC:  Chief Complaint  Patient presents with  . Nausea    TOC pt complains of light headed and nausous x 2 months come and go.    HPI Holly Ortiz presents for establishment of care by way of transfer.  Significant past medical history of diabetes, hyper tension, elevated cholesterol, chronic urticaria and anxiety.  Diabetes has not been well controlled recently.  She is seeing endocrinology at this time.  She is here with her husband today.  They have 4 children and multiple grandchildren.  She does not smoke, rarely if ever drinks alcohol.  Colonoscopy in 2014.  She is pursuing dental care and will have her eyes examined after the first of the year.  Status post recent hysterectomy with oophorectomy back in September for what sounds like for uterine fibroids.  She was anemic at that time but has since been able to bring her hemoglobin up into the normal range by improving her diet.  Last hemoglobin on the chart was 10.  She has been experiencing lightheadedness on occasion associated with nausea.  Diabetes has been traditionally poorly controlled.  She had 1 of these episodes at work and drink a couple of glasses of water and that seemed to help.  Yellow she can now she will go to the lab he is going to come back do one the other but they are both done  Past Medical History:  Diagnosis Date  . Allergy   . 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-I)   . Anxiety   . Asthma   . Diabetes mellitus    type II  . Hyperlipidemia   . Hypertension   . Urticaria, chronic   . UTI (lower urinary tract infection)     Past Surgical History:  Procedure Laterality Date  . ABDOMINAL HYSTERECTOMY  08/07/2019  . breast lift  1993  . CATARACT EXTRACTION, BILATERAL Bilateral 2014  . LAPAROSCOPIC VAGINAL HYSTERECTOMY WITH SALPINGO OOPHORECTOMY  Bilateral 08/07/2019   Procedure: LAPAROSCOPIC ASSISTED VAGINAL HYSTERECTOMY WITH SALPINGO OOPHORECTOMY, possible abdominal hysterectomy;  Surgeon: Everlene Farrier, MD;  Location: [facility];  Service: Gynecology;  Laterality: Bilateral;  possible abdominal hysterectomy pt is diabetic and hypertensive  . SOFT TISSUE MASS EXCISION Right 2016   [place] area  . TUBAL LIGATION  1995  . tummy tuck  1993    Family History  Problem Relation Age of Onset  . Hyperlipidemia Other   . Heart disease Other   . Diabetes Father   . Hypertension Father   . COPD Father        Cause of death  . Colon cancer Mother   . Hypertension Mother   . Hypertension Sister   . Diabetes Sister   . Hypertension Brother   . Cancer Sister        Brain glioblastoma  . Diabetes Sister     Social History   Socioeconomic History  . Marital status: Married    Spouse name: Audelia Hives  . Number of children: 4  . Years of education: 13--1.5 years of college  . Highest education level: 12th grade  Occupational History  . Occupation: banking  Tobacco Use  . Smoking status: Never Smoker  . Smokeless tobacco: Never Used  Substance and Sexual Activity  . Alcohol use: No  . Drug use: No  . Sexual activity: Yes  Birth control/protection: Surgical    Comment: BTL  Other Topics Concern  . Not on file  Social History Narrative  . Not on file   Social Determinants of Health   Financial Resource Strain:   . Difficulty of Paying Living Expenses: Not on file  Food Insecurity:   . Worried About Charity fundraiser in the Last Year: Not on file  . Ran Out of Food in the Last Year: Not on file  Transportation Needs:   . Lack of Transportation (Medical): Not on file  . Lack of Transportation (Non-Medical): Not on file  Physical Activity:   . Days of Exercise per Week: Not on file  . Minutes of Exercise per Session: Not on file  Stress:   . Feeling of Stress : Not on file  Social Connections:   . Frequency of  Communication with Friends and Family: Not on file  . Frequency of Social Gatherings with Friends and Family: Not on file  . Attends Religious Services: Not on file  . Active Member of Clubs or Organizations: Not on file  . Attends Archivist Meetings: Not on file  . Marital Status: Not on file  Intimate Partner Violence:   . Fear of Current or Ex-Partner: Not on file  . Emotionally Abused: Not on file  . Physically Abused: Not on file  . Sexually Abused: Not on file    Outpatient Medications Prior to Visit  Medication Sig Dispense Refill  . acetaminophen (TYLENOL) 500 MG tablet Take 2 tablets (1,000 mg total) by mouth every 6 (six) hours as needed. 60 tablet 0  . amLODipine (NORVASC) 10 MG tablet Take 1 tablet (10 mg total) by mouth daily. 90 tablet 0  . atorvastatin (LIPITOR) 20 MG tablet Take 1 tablet (20 mg total) by mouth daily. 90 tablet 1  . canagliflozin (INVOKANA) 100 MG TABS tablet 1 tablet before breakfast 90 tablet 1  . Cholecalciferol (VITAMIN D) 50 MCG (2000 UT) CAPS Take 2,000 Units by mouth daily.    Marland Kitchen escitalopram (LEXAPRO) 20 MG tablet Take 1 tablet (20 mg total) by mouth daily. 90 tablet 0  . hydrochlorothiazide (HYDRODIURIL) 12.5 MG tablet Take 1 tablet (12.5 mg total) by mouth daily. **NEED TO ESTABLISH WITH NEW PROVIDER** 90 tablet 0  . ibuprofen (ADVIL) 600 MG tablet Take 1 tablet (600 mg total) by mouth every 6 (six) hours as needed for moderate pain. 60 tablet 0  . insulin aspart (NOVOLOG FLEXPEN) 100 UNIT/ML FlexPen Inject 20 units under the skin three times daily before meals. 75 mL 1  . Insulin Glargine (BASAGLAR KWIKPEN) 100 UNIT/ML SOPN INJECT 60 UNITS SUBCUTANEOUSLY ONCE DAILY 15 mL 2  . Insulin Pen Needle (BD PEN NEEDLE NANO 2ND GEN) 32G X 4 MM MISC Use four times daily to inject insulin. 200 each 11  . Melatonin 5 MG TABS Take 5 mg by mouth at bedtime.    . montelukast (SINGULAIR) 10 MG tablet Take 1 tablet (10 mg total) by mouth at bedtime. 90  tablet 0  . vitamin B-12 (CYANOCOBALAMIN) 1000 MCG tablet Take 1,000 mcg by mouth daily.    Marland Kitchen zinc gluconate 50 MG tablet Take 50 mg by mouth daily.    . Insulin Lispro Prot & Lispro (HUMALOG MIX 75/25 KWIKPEN) (75-25) 100 UNIT/ML Kwikpen Inject 25 Units into the skin 2 (two) times daily with a meal.     No facility-administered medications prior to visit.    Allergies  Allergen Reactions  .  Actos [Pioglitazone Hydrochloride] Other (See Comments)    Wt gain  . Lisinopril     Angioedema, including likely GI involvement  . Metformin And Related Diarrhea and Nausea And Vomiting    ROS Review of Systems  Constitutional: Negative for diaphoresis, fatigue, fever and unexpected weight change.  HENT: Negative for hearing loss.   Eyes: Negative for photophobia and visual disturbance.  Respiratory: Negative.   Cardiovascular: Negative.   Gastrointestinal: Positive for nausea. Negative for abdominal pain and vomiting.  Endocrine: Negative for polyphagia and polyuria.  Genitourinary: Negative.   Musculoskeletal: Negative for gait problem and joint swelling.  Skin: Negative for pallor and rash.  Neurological: Positive for light-headedness. Negative for dizziness and headaches.  Hematological: Does not bruise/bleed easily.  Psychiatric/Behavioral: The patient is nervous/anxious.    Depression screen [facility] 2/9 12/02/2019 12/23/2018 12/18/2017  Decreased Interest 0 0 2  Down, Depressed, Hopeless 0 0 2  PHQ - 2 Score 0 0 4  Altered sleeping - 1 1  Tired, decreased energy - 1 3  Change in appetite - 1 1  Feeling bad or failure about yourself  - 0 3  Trouble concentrating - 1 2  Moving slowly or fidgety/restless - 0 0  Suicidal thoughts - 0 0  PHQ-9 Score - 4 14  Difficult doing work/chores - Not difficult at all -      Objective:    Physical Exam  Constitutional: She is oriented to person, place, and time. She appears well-developed and well-nourished. No distress.  HENT:  Head:  Normocephalic and atraumatic.  Right Ear: External ear normal.  Left Ear: External ear normal.  Eyes: Conjunctivae are normal. Right eye exhibits no discharge. Left eye exhibits no discharge. No scleral icterus.  Neck: No JVD present. No tracheal deviation present.  Cardiovascular: Normal rate, regular rhythm and normal heart sounds.  Pulmonary/Chest: Effort normal and breath sounds normal. No stridor.  Neurological: She is alert and oriented to person, place, and time.  Skin: Skin is warm and dry. She is not diaphoretic.  Psychiatric: She has a normal mood and affect. Her behavior is normal.    BP 120/72   Pulse (!) 101   Temp 98.1 F (36.7 C) (Oral)   Ht 5\' 3"  (1.6 m)   Wt 203 lb 12.8 oz (92.4 kg)   SpO2 98%   BMI 36.10 kg/m  Wt Readings from Last 3 Encounters:  12/02/19 203 lb 12.8 oz (92.4 kg)  09/05/19 197 lb 3.2 oz (89.4 kg)  08/07/19 192 lb 12.8 oz (87.5 kg)     Health Maintenance Due  Topic Date Due  . OPHTHALMOLOGY EXAM  01/09/1977  . MAMMOGRAM  01/09/2017  . COLONOSCOPY  01/09/2017  . URINE MICROALBUMIN  12/18/2018  . FOOT EXAM  08/29/2019    There are no preventive care reminders to display for this patient.  Lab Results  Component Value Date   TSH 1.497 12/23/2018   Lab Results  Component Value Date   WBC 7.1 12/02/2019   HGB 12.4 12/02/2019   HCT 38.9 12/02/2019   MCV 78.2 12/02/2019   PLT 348.0 12/02/2019   Lab Results  Component Value Date   NA 140 09/01/2019   K 3.9 09/01/2019   CO2 30 09/01/2019   GLUCOSE 201 (H) 09/01/2019   BUN 19 09/01/2019   CREATININE 0.49 09/01/2019   BILITOT 0.4 09/01/2019   ALKPHOS 60 09/01/2019   AST 13 09/01/2019   ALT 17 09/01/2019   PROT 7.7 09/01/2019  ALBUMIN 4.1 09/01/2019   CALCIUM 9.9 09/01/2019   ANIONGAP 10 07/30/2019   GFR 132.29 09/01/2019   Lab Results  Component Value Date   CHOL 138 07/21/2019   Lab Results  Component Value Date   HDL 35.90 (L) 07/21/2019   Lab Results  Component  Value Date   LDLCALC 71 07/21/2019   Lab Results  Component Value Date   TRIG 154.0 (H) 07/21/2019   Lab Results  Component Value Date   CHOLHDL 4 07/21/2019   Lab Results  Component Value Date   HGBA1C 9.6 (H) 09/01/2019      Assessment & Plan:   Problem List Items Addressed This Visit      Other   Anemia - Primary   Relevant Medications   Iron, Ferrous Sulfate, 325 (65 Fe) MG TABS   Other Relevant Orders   CBC (Completed)   Iron, TIBC and Ferritin Panel (Completed)   B12 (Completed)   Lightheadedness   Relevant Orders   Urinalysis, Routine w reflex microscopic (Completed)      Meds ordered this encounter  Medications  . Iron, Ferrous Sulfate, 325 (65 Fe) MG TABS    Sig: Take 1 tablet by mouth daily.    Dispense:  90 tablet    Refill:  1    Follow-up: Return return for follow up of other health issues..  With orthostatics patient did not tilt.  Above lab work pending.  Suspect that her lightheadedness may be associated with dehydration associated with not well controlled diabetes.  She will follow up for her other health issues in the next few months.   Libby Maw, MD

## 2019-12-03 LAB — IRON,TIBC AND FERRITIN PANEL
%SAT: 8 % (calc) — ABNORMAL LOW (ref 16–45)
Ferritin: 28 ng/mL (ref 16–232)
Iron: 29 ug/dL — ABNORMAL LOW (ref 45–160)
TIBC: 357 mcg/dL (calc) (ref 250–450)

## 2019-12-05 ENCOUNTER — Other Ambulatory Visit: Payer: Self-pay | Admitting: Family

## 2019-12-05 ENCOUNTER — Other Ambulatory Visit: Payer: Self-pay | Admitting: Internal Medicine

## 2019-12-05 DIAGNOSIS — L509 Urticaria, unspecified: Secondary | ICD-10-CM

## 2019-12-05 DIAGNOSIS — I10 Essential (primary) hypertension: Secondary | ICD-10-CM

## 2019-12-05 DIAGNOSIS — F419 Anxiety disorder, unspecified: Secondary | ICD-10-CM

## 2019-12-05 DIAGNOSIS — F32A Depression, unspecified: Secondary | ICD-10-CM

## 2019-12-05 DIAGNOSIS — F329 Major depressive disorder, single episode, unspecified: Secondary | ICD-10-CM

## 2019-12-08 MED ORDER — IRON (FERROUS SULFATE) 325 (65 FE) MG PO TABS: 1.0000 | ORAL_TABLET | Freq: Every day | ORAL | 1 refills | Status: DC

## 2019-12-08 NOTE — Addendum Note (Signed)
Addended by: Jon Billings on: 12/08/2019 09:31 AM   Modules accepted: Orders

## 2019-12-10 ENCOUNTER — Other Ambulatory Visit: Payer: Self-pay | Admitting: Family Medicine

## 2019-12-10 DIAGNOSIS — I10 Essential (primary) hypertension: Secondary | ICD-10-CM

## 2019-12-10 DIAGNOSIS — E785 Hyperlipidemia, unspecified: Secondary | ICD-10-CM

## 2019-12-10 DIAGNOSIS — F32A Depression, unspecified: Secondary | ICD-10-CM

## 2019-12-10 DIAGNOSIS — F329 Major depressive disorder, single episode, unspecified: Secondary | ICD-10-CM

## 2019-12-10 DIAGNOSIS — F419 Anxiety disorder, unspecified: Secondary | ICD-10-CM

## 2019-12-10 MED ORDER — HYDROCHLOROTHIAZIDE 12.5 MG PO TABS: 12.5000 mg | ORAL_TABLET | Freq: Every day | ORAL | 0 refills | Status: DC

## 2019-12-10 MED ORDER — ATORVASTATIN CALCIUM 20 MG PO TABS: 20.0000 mg | ORAL_TABLET | Freq: Every day | ORAL | 0 refills | Status: DC

## 2019-12-10 MED ORDER — ESCITALOPRAM OXALATE 20 MG PO TABS: 20.0000 mg | ORAL_TABLET | Freq: Every day | ORAL | 0 refills | Status: DC

## 2019-12-10 NOTE — Telephone Encounter (Signed)
Got the ok with Dr. Ethelene Hal to refill pt'smedication as she has transitioned care to him

## 2019-12-16 ENCOUNTER — Ambulatory Visit: Payer: Managed Care, Other (non HMO) | Admitting: Family Medicine

## 2019-12-16 ENCOUNTER — Telehealth: Payer: Self-pay

## 2019-12-16 NOTE — Telephone Encounter (Signed)
LMTCB to schedule.

## 2019-12-16 NOTE — Telephone Encounter (Signed)
-----   Message from Elayne Snare, MD sent at 12/16/2019  9:35 AM EST ----- Regarding: Missed appointment Please call to reschedule her appointment for diabetes follow-up, same day labs okay

## 2019-12-18 NOTE — Telephone Encounter (Signed)
LMTCB to schedule.

## 2019-12-23 ENCOUNTER — Other Ambulatory Visit: Payer: Self-pay | Admitting: Family

## 2019-12-23 DIAGNOSIS — L509 Urticaria, unspecified: Secondary | ICD-10-CM

## 2019-12-24 NOTE — Telephone Encounter (Signed)
3rd attempt to contact patient-sending letter out 

## 2020-01-09 ENCOUNTER — Other Ambulatory Visit (INDEPENDENT_AMBULATORY_CARE_PROVIDER_SITE_OTHER): Payer: Managed Care, Other (non HMO)

## 2020-01-09 ENCOUNTER — Other Ambulatory Visit: Payer: Self-pay

## 2020-01-09 DIAGNOSIS — Z794 Long term (current) use of insulin: Secondary | ICD-10-CM | POA: Diagnosis not present

## 2020-01-09 DIAGNOSIS — E1165 Type 2 diabetes mellitus with hyperglycemia: Secondary | ICD-10-CM

## 2020-01-09 LAB — COMPREHENSIVE METABOLIC PANEL
ALT: 28 U/L (ref 0–35)
AST: 17 U/L (ref 0–37)
Albumin: 4 g/dL (ref 3.5–5.2)
Alkaline Phosphatase: 66 U/L (ref 39–117)
BUN: 13 mg/dL (ref 6–23)
CO2: 31 mEq/L (ref 19–32)
Calcium: 9.9 mg/dL (ref 8.4–10.5)
Chloride: 98 mEq/L (ref 96–112)
Creatinine, Ser: 0.5 mg/dL (ref 0.40–1.20)
GFR: 129.06 mL/min (ref >60.00)
Glucose, Bld: 268 mg/dL — ABNORMAL HIGH (ref 70–99)
Potassium: 4.3 mEq/L (ref 3.5–5.1)
Sodium: 137 mEq/L (ref 135–145)
Total Bilirubin: 0.4 mg/dL (ref 0.2–1.2)
Total Protein: 7.4 g/dL (ref 6.0–8.3)

## 2020-01-09 LAB — MICROALBUMIN / CREATININE URINE RATIO
Creatinine,U: 47.1 mg/dL
Microalb Creat Ratio: 4.3 mg/g (ref 0.0–30.0)
Microalb, Ur: 2 mg/dL — ABNORMAL HIGH (ref 0.0–1.9)

## 2020-01-09 LAB — HEMOGLOBIN A1C: Hgb A1c MFr Bld: 10.1 % — ABNORMAL HIGH (ref 4.6–6.5)

## 2020-01-13 ENCOUNTER — Other Ambulatory Visit: Payer: Self-pay

## 2020-01-13 ENCOUNTER — Ambulatory Visit: Payer: Managed Care, Other (non HMO) | Admitting: Endocrinology

## 2020-01-13 ENCOUNTER — Encounter: Payer: Self-pay | Admitting: Endocrinology

## 2020-01-13 VITALS — BP 150/80 | HR 97 | Ht 63.0 in | Wt 197.4 lb

## 2020-01-13 DIAGNOSIS — I10 Essential (primary) hypertension: Secondary | ICD-10-CM

## 2020-01-13 DIAGNOSIS — Z794 Long term (current) use of insulin: Secondary | ICD-10-CM

## 2020-01-13 DIAGNOSIS — E1165 Type 2 diabetes mellitus with hyperglycemia: Secondary | ICD-10-CM

## 2020-01-13 LAB — GLUCOSE, POCT (MANUAL RESULT ENTRY): POC Glucose: 233 mg/dl — AB (ref 70–99)

## 2020-01-13 MED ORDER — OZEMPIC (0.25 OR 0.5 MG/DOSE) 2 MG/1.5ML ~~LOC~~ SOPN: 0.5000 mg | PEN_INJECTOR | SUBCUTANEOUS | 2 refills | Status: DC

## 2020-01-13 MED ORDER — HUMULIN R U-500 KWIKPEN 500 UNIT/ML ~~LOC~~ SOPN: PEN_INJECTOR | SUBCUTANEOUS | 0 refills | Status: DC

## 2020-01-13 MED ORDER — CANAGLIFLOZIN 300 MG PO TABS: 300.0000 mg | ORAL_TABLET | Freq: Every day | ORAL | 3 refills | Status: DC

## 2020-01-13 NOTE — Progress Notes (Signed)
Patient ID: Holly Ortiz, female   DOB: Aug 11, 1966, 54 y.o.   MRN: [id number]          Reason for Appointment: Follow-up for Type 2 Diabetes     History of Present Illness:          Date of diagnosis of type 2 diabetes mellitus:   At age 46      Background history:  Her diabetes was diagnosed incidentally when she was having unrelated problems She took various oral agents including metformin for several years About 5 years ago she was started on insulin and she apparently was taking only one kind of insulin possibly a premixed insulin twice a day She said that her blood sugars have been usually poorly controlled with A1c usually 8-9% She was also taking metformin, Amaryl and Januvia prior to moving here from [place]  Recent history:    INSULIN regimen is: Basaglar 0 units daily, Humalog 20-28 units before meals   Non-insulin hypoglycemic drugs the patient is taking are: Invokana 100 mg daily  Current management, blood sugar patterns and problems identified:   Her A1c was 9.6 previously and now 10.1   She did not keep a record of her blood sugars or bring her meter today  She has been on Lantus or Basaglar since at least 7/20 but she now says that she was convinced that it was causing her to gain weight and have swelling over her abdomen  She stopped taking this completely about 2 weeks ago  With this her blood sugars have has been as high as 370 3 in the morning  She may sometimes have better blood sugars before dinnertime since she takes up to 28 units to cover her main meal which is around lunchtime  Blood sugars are however still high late at night even though she does not eat a big meal  May sometimes eat only cereal or oatmeal at dinnertime  She was told to increase her Invokana but she is still taking 100 mg  She still has not done regular exercise        Side effects from medications have been: Hives from [place]  Compliance with the medical regimen:  Poor      Glucose monitoring:  done 1-3 times a day         Glucometer: [employer] brand       Blood Glucose readings from recall as above  Previous readings:   PRE-MEAL Fasting Lunch Dinner Bedtime Overall  Glucose range: 300 +  180 275   Mean/median:          PRE-MEAL Fasting Lunch Dinner Bedtime Overall  Glucose range:  129-271  108-142  133-154    Mean/median:     ?   POST-MEAL PC Breakfast PC Lunch PC Dinner  Glucose range:   233  205  Mean/median:        Dietician visit, most recent: A few years ago in [place]  Weight history:  Wt Readings from Last 3 Encounters:  01/13/20 197 lb 6.4 oz (89.5 kg)  12/02/19 203 lb 12.8 oz (92.4 kg)  09/05/19 197 lb 3.2 oz (89.4 kg)    Glycemic control:   Lab Results  Component Value Date   HGBA1C 10.1 (H) 01/09/2020   HGBA1C 9.6 (H) 09/01/2019   HGBA1C 10.0 (H) 08/08/2019   Lab Results  Component Value Date   MICROALBUR 2.0 (H) 01/09/2020   LDLCALC 71 07/21/2019   CREATININE 0.50 01/09/2020   Lab Results  Component Value Date   MICRALBCREAT 4.3 01/09/2020    Lab Results  Component Value Date   FRUCTOSAMINE 325 (H) 07/21/2019    Office Visit on 01/13/2020  Component Date Value Ref Range Status  . POC Glucose 01/13/2020 233* 70 - 99 mg/dl Final  Lab on 01/09/2020  Component Date Value Ref Range Status  . Microalb, Ur 01/09/2020 2.0* 0.0 - 1.9 mg/dL Final  . Creatinine,U 01/09/2020 47.1  mg/dL Final  . Microalb Creat Ratio 01/09/2020 4.3  0.0 - 30.0 mg/g Final  . Sodium 01/09/2020 137  135 - 145 mEq/L Final  . Potassium 01/09/2020 4.3  3.5 - 5.1 mEq/L Final  . Chloride 01/09/2020 98  96 - 112 mEq/L Final  . CO2 01/09/2020 31  19 - 32 mEq/L Final  . Glucose, Bld 01/09/2020 268* 70 - 99 mg/dL Final  . BUN 01/09/2020 13  6 - 23 mg/dL Final  . Creatinine, Ser 01/09/2020 0.50  0.40 - 1.20 mg/dL Final  . Total Bilirubin 01/09/2020 0.4  0.2 - 1.2 mg/dL Final  . Alkaline Phosphatase 01/09/2020 66  39 - 117 U/L  Final  . AST 01/09/2020 17  0 - 37 U/L Final  . ALT 01/09/2020 28  0 - 35 U/L Final  . Total Protein 01/09/2020 7.4  6.0 - 8.3 g/dL Final  . Albumin 01/09/2020 4.0  3.5 - 5.2 g/dL Final  . GFR 01/09/2020 129.06  >60.00 mL/min Final  . Calcium 01/09/2020 9.9  8.4 - 10.5 mg/dL Final  . Hgb A1c MFr Bld 01/09/2020 10.1* 4.6 - 6.5 % Final   Glycemic Control Guidelines for People with Diabetes:Non Diabetic:  <6%Goal of Therapy: <7%Additional Action Suggested:  >8%     Allergies as of 01/13/2020      Reactions   Actos [pioglitazone Hydrochloride] Other (See Comments)   Wt gain   Lisinopril    Angioedema, including likely GI involvement   Metformin And Related Diarrhea, Nausea And Vomiting      Medication List       Accurate as of January 13, 2020  1:15 PM. If you have any questions, ask your nurse or doctor.        STOP taking these medications   Basaglar KwikPen 100 UNIT/ML Sopn Stopped by: Elayne Snare, MD   HumaLOG Mix 75/25 KwikPen (75-25) 100 UNIT/ML Kwikpen Generic drug: Insulin Lispro Prot & Lispro Stopped by: Elayne Snare, MD     TAKE these medications   acetaminophen 500 MG tablet Commonly known as: TYLENOL Take 2 tablets (1,000 mg total) by mouth every 6 (six) hours as needed.   amLODipine 10 MG tablet Commonly known as: NORVASC Take 1 tablet (10 mg total) by mouth daily.   atorvastatin 20 MG tablet Commonly known as: Lipitor Take 1 tablet (20 mg total) by mouth daily.   BD Pen Needle Nano 2nd Gen 32G X 4 MM Misc Generic drug: Insulin Pen Needle Use four times daily to inject insulin.   canagliflozin 300 MG Tabs tablet Commonly known as: Invokana Take 1 tablet (300 mg total) by mouth daily before breakfast. What changed:   medication strength  how much to take  how to take this  when to take this  additional instructions Changed by: Elayne Snare, MD   escitalopram 20 MG tablet Commonly known as: LEXAPRO Take 1 tablet (20 mg total) by mouth daily.     hydrochlorothiazide 12.5 MG tablet Commonly known as: HYDRODIURIL Take 1 tablet (12.5 mg total) by mouth daily.  ibuprofen 600 MG tablet Commonly known as: ADVIL Take 1 tablet (600 mg total) by mouth every 6 (six) hours as needed for moderate pain.   Iron (Ferrous Sulfate) 325 (65 Fe) MG Tabs Take 1 tablet by mouth daily.   Melatonin 5 MG Tabs Take 5 mg by mouth at bedtime.   montelukast 10 MG tablet Commonly known as: SINGULAIR Take 1 tablet (10 mg total) by mouth at bedtime.   NovoLOG FlexPen 100 UNIT/ML FlexPen Generic drug: insulin aspart Inject 20 units under the skin three times daily before meals. What changed: additional instructions   Ozempic (0.25 or 0.5 MG/DOSE) 2 MG/1.5ML Sopn Generic drug: Semaglutide(0.25 or 0.5MG /DOS) Inject 0.5 mg into the skin once a week. Started by: Elayne Snare, MD   vitamin B-12 1000 MCG tablet Commonly known as: CYANOCOBALAMIN Take 1,000 mcg by mouth daily.   Vitamin D 50 MCG (2000 UT) Caps Take 2,000 Units by mouth daily.   zinc gluconate 50 MG tablet Take 50 mg by mouth daily.       Allergies:  Allergies  Allergen Reactions  . Actos [Pioglitazone Hydrochloride] Other (See Comments)    Wt gain  . Lisinopril     Angioedema, including likely GI involvement  . Metformin And Related Diarrhea and Nausea And Vomiting    Past Medical History:  Diagnosis Date  . Allergy   . 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-I)   . Anxiety   . Asthma   . Diabetes mellitus    type II  . Hyperlipidemia   . Hypertension   . Urticaria, chronic   . UTI (lower urinary tract infection)     Past Surgical History:  Procedure Laterality Date  . ABDOMINAL HYSTERECTOMY  08/07/2019  . breast lift  1993  . CATARACT EXTRACTION, BILATERAL Bilateral 2014  . LAPAROSCOPIC VAGINAL HYSTERECTOMY WITH SALPINGO OOPHORECTOMY Bilateral 08/07/2019   Procedure: LAPAROSCOPIC ASSISTED VAGINAL HYSTERECTOMY WITH SALPINGO OOPHORECTOMY,  possible abdominal hysterectomy;  Surgeon: Everlene Farrier, MD;  Location: [facility];  Service: Gynecology;  Laterality: Bilateral;  possible abdominal hysterectomy pt is diabetic and hypertensive  . SOFT TISSUE MASS EXCISION Right 2016   [place] area  . TUBAL LIGATION  1995  . tummy tuck  1993    Family History  Problem Relation Age of Onset  . Hyperlipidemia Other   . Heart disease Other   . Diabetes Father   . Hypertension Father   . COPD Father        Cause of death  . Colon cancer Mother   . Hypertension Mother   . Hypertension Sister   . Diabetes Sister   . Hypertension Brother   . Cancer Sister        Brain glioblastoma  . Diabetes Sister     Social History:  reports that she has never smoked. She has never used smokeless tobacco. She reports that she does not drink alcohol or use drugs.   Review of Systems   Lipid history: On Lipitor 20 mg from her PCP   She last has LDL of 71    Lab Results  Component Value Date   CHOL 138 07/21/2019   HDL 35.90 (L) 07/21/2019   LDLCALC 71 07/21/2019   LDLDIRECT 129.0 07/03/2018   TRIG 154.0 (H) 07/21/2019   CHOLHDL 4 07/21/2019           Hypertension: Has been present since age 14, currently on amlodipine and HCTZ 12.5mg  prescribed by her PCP  May have had angioedema  with lisinopril   BP Readings from Last 3 Encounters:  01/13/20 (!) 150/80  12/02/19 120/72  09/05/19 122/68    Most recent eye exam was in 2017  Most recent foot exam: 9/19  Currently known complications of diabetes: None  LABS:  Office Visit on 01/13/2020  Component Date Value Ref Range Status  . POC Glucose 01/13/2020 233* 70 - 99 mg/dl Final  Lab on 01/09/2020  Component Date Value Ref Range Status  . Microalb, Ur 01/09/2020 2.0* 0.0 - 1.9 mg/dL Final  . Creatinine,U 01/09/2020 47.1  mg/dL Final  . Microalb Creat Ratio 01/09/2020 4.3  0.0 - 30.0 mg/g Final  . Sodium 01/09/2020 137  135 - 145 mEq/L Final  . Potassium 01/09/2020 4.3  3.5 -  5.1 mEq/L Final  . Chloride 01/09/2020 98  96 - 112 mEq/L Final  . CO2 01/09/2020 31  19 - 32 mEq/L Final  . Glucose, Bld 01/09/2020 268* 70 - 99 mg/dL Final  . BUN 01/09/2020 13  6 - 23 mg/dL Final  . Creatinine, Ser 01/09/2020 0.50  0.40 - 1.20 mg/dL Final  . Total Bilirubin 01/09/2020 0.4  0.2 - 1.2 mg/dL Final  . Alkaline Phosphatase 01/09/2020 66  39 - 117 U/L Final  . AST 01/09/2020 17  0 - 37 U/L Final  . ALT 01/09/2020 28  0 - 35 U/L Final  . Total Protein 01/09/2020 7.4  6.0 - 8.3 g/dL Final  . Albumin 01/09/2020 4.0  3.5 - 5.2 g/dL Final  . GFR 01/09/2020 129.06  >60.00 mL/min Final  . Calcium 01/09/2020 9.9  8.4 - 10.5 mg/dL Final  . Hgb A1c MFr Bld 01/09/2020 10.1* 4.6 - 6.5 % Final   Glycemic Control Guidelines for People with Diabetes:Non Diabetic:  <6%Goal of Therapy: <7%Additional Action Suggested:  >8%     Physical Examination:  BP (!) 150/80 (BP Location: Left Arm, Patient Position: Sitting, Cuff Size: Large)   Pulse 97   Ht 5\' 3"  (1.6 m)   Wt 197 lb 6.4 oz (89.5 kg)   SpO2 96%   BMI 34.97 kg/m       ASSESSMENT:  Diabetes type 2, with history of persistent hyperglycemia, insulin-dependent  See history of present illness for detailed discussion of current diabetes management, blood sugar patterns and problems identified  A1c is 10.1 compared to 9.6  Although she has been insulin-dependent for quite some time she has had poor control She is refusing to take Basaglar and reportedly had hives from [place] She is concerned about her weight gain and not clear why she had weight gain recently even though she has been on Basaglar or Lantus for at least 6 months previously As expected her fasting readings are higher without basal insulin over 300 recently  She is taking low-dose Invokana However she is a good candidate for a GLP-1 drug for multiple benefits and does not appear to have tried this before Diet has been variable She does not exercise Also does not  check her sugars likely enough and difficult to know what her blood sugar patterns are since she does not bring her meter or any written record.  She also refuses to use the brand-name meter because of higher cost  Hypertension: Variable control, managed by PCP currently   HYPERLIPIDEMIA: controlled previously  PLAN:   Again instructed her on increasing Invokana, now to 200 mg to finish her current prescription and then 300 mg daily, new prescription sent New insulin regimen of U-500 Humulin R  only Discussed in detail the concentrated nature of this insulin, timing of injection, duration of action of 6 to 10 hours and need for adjustment based on blood sugar readings  She will start with 30 before Bfst, 45 before lunch, 20-25 at supper and 30 at bedtime She was given a detailed instruction sheet on how to adjust the bedtime dose based on fasting blood sugars every 3 days and can start with 30 units Also she will start on a GLP-1 injection with Ozempic Discussed with the patient the nature of GLP-1 drugs, the actions on various organ systems, how they benefit blood glucose control, as well as the benefit of weight loss and  increase satiety . Explained possible side effects especially nausea and vomiting initially; discussed safety information in package insert.   Patient information with co-pay card given She will need to check her blood sugars 4 times a day and keep a written record of blood sugars to bring on the next visit Start regular walking for exercise Consider consultation with dietitian     Patient Instructions  U-500 insulin: 30 before Bfst, 45 before lunch, 20-25 at supper and 30 at bedtime  Invokana 200mg  daily  Start OZEMPIC injections by dialing 0.25 mg on the pen as shown once weekly on the same day of the week.   You may inject in the sides of the stomach, outer thigh or arm as indicated in the brochure given. If you have any difficulties using the pen see the video  at [email or web address]  You will feel fullness of the stomach with starting the medication and should try to keep the portions at meals small.  You may experience nausea in the first few days which usually gets better over time    After 4 weeks increase the dose to 0.5 mg weekly  If you have any questions or persistent side effects please call the office   You may also talk to a nurse educator with [employer] at [phone] Useful website: [email or web address]   Check [employer] cost        Elayne Snare 01/13/2020, 1:15 PM   Note: This office note was prepared with Dragon voice recognition system technology. Any transcriptional errors that result from this process are unintentional.

## 2020-01-13 NOTE — Patient Instructions (Addendum)
U-500 insulin: 30 before Bfst, 45 before lunch, 20-25 at supper and 30 at bedtime  Invokana 200mg  daily  Start OZEMPIC injections by dialing 0.25 mg on the pen as shown once weekly on the same day of the week.   You may inject in the sides of the stomach, outer thigh or arm as indicated in the brochure given. If you have any difficulties using the pen see the video at [email or web address]  You will feel fullness of the stomach with starting the medication and should try to keep the portions at meals small.  You may experience nausea in the first few days which usually gets better over time    After 4 weeks increase the dose to 0.5 mg weekly  If you have any questions or persistent side effects please call the office   You may also talk to a nurse educator with [employer] at [phone] Useful website: [email or web address]   Check Freestyle [place] cost

## 2020-01-14 ENCOUNTER — Encounter: Payer: Self-pay | Admitting: Family Medicine

## 2020-01-14 ENCOUNTER — Ambulatory Visit (INDEPENDENT_AMBULATORY_CARE_PROVIDER_SITE_OTHER): Payer: Managed Care, Other (non HMO) | Admitting: Family Medicine

## 2020-01-14 VITALS — BP 150/70 | Ht 63.0 in | Wt 197.0 lb

## 2020-01-14 DIAGNOSIS — M545 Low back pain, unspecified: Secondary | ICD-10-CM | POA: Insufficient documentation

## 2020-01-14 DIAGNOSIS — L509 Urticaria, unspecified: Secondary | ICD-10-CM

## 2020-01-14 DIAGNOSIS — I10 Essential (primary) hypertension: Secondary | ICD-10-CM | POA: Diagnosis not present

## 2020-01-14 MED ORDER — MONTELUKAST SODIUM 10 MG PO TABS: 10.0000 mg | ORAL_TABLET | Freq: Every day | ORAL | 1 refills | Status: DC

## 2020-01-14 MED ORDER — MELOXICAM 7.5 MG PO TABS: ORAL_TABLET | ORAL | 0 refills | Status: DC

## 2020-01-14 MED ORDER — AMLODIPINE BESYLATE 10 MG PO TABS: 10.0000 mg | ORAL_TABLET | Freq: Every day | ORAL | 1 refills | Status: DC

## 2020-01-14 NOTE — Progress Notes (Signed)
Established Patient Office Visit  Subjective:  Patient ID: Holly Ortiz, female    DOB: 1966-12-03  Age: 54 y.o. MRN: [id number]  CC:  Chief Complaint  Patient presents with  . Follow-up    follow up on blood work, pt states that she has knee pain and lower back pains. Patient would also like a refill on NORVASC and SINGULAIR    HPI Holly Ortiz presents for follow-up of her hypertension anemia and urticaria.  Hypertension has been controlled with Norvasc.  History of angioedema with ACE inhibitors.  She has taken Singulair for years for her urticaria without personality changes.  She has has a 1 week history of lower back pain bilateral knee pain right greater than left.  Back pain is centralized and nonradiating.  Denies radiation of pain bowel or bladder dysfunction paresthesias.  It has been difficult for her to take more than 1 iron pill daily secondary to constipation.  Past Medical History:  Diagnosis Date  . Allergy   . 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-I)   . Anxiety   . Asthma   . Diabetes mellitus    type II  . Hyperlipidemia   . Hypertension   . Urticaria, chronic   . UTI (lower urinary tract infection)     Past Surgical History:  Procedure Laterality Date  . ABDOMINAL HYSTERECTOMY  08/07/2019  . breast lift  1993  . CATARACT EXTRACTION, BILATERAL Bilateral 2014  . LAPAROSCOPIC VAGINAL HYSTERECTOMY WITH SALPINGO OOPHORECTOMY Bilateral 08/07/2019   Procedure: LAPAROSCOPIC ASSISTED VAGINAL HYSTERECTOMY WITH SALPINGO OOPHORECTOMY, possible abdominal hysterectomy;  Surgeon: Everlene Farrier, MD;  Location: [facility];  Service: Gynecology;  Laterality: Bilateral;  possible abdominal hysterectomy pt is diabetic and hypertensive  . SOFT TISSUE MASS EXCISION Right 2016   [place] area  . TUBAL LIGATION  1995  . tummy tuck  1993    Family History  Problem Relation Age of Onset  . Hyperlipidemia Other   . Heart disease Other   . Diabetes  Father   . Hypertension Father   . COPD Father        Cause of death  . Colon cancer Mother   . Hypertension Mother   . Hypertension Sister   . Diabetes Sister   . Hypertension Brother   . Cancer Sister        Brain glioblastoma  . Diabetes Sister     Social History   Socioeconomic History  . Marital status: Married    Spouse name: Audelia Hives  . Number of children: 4  . Years of education: 13--1.5 years of college  . Highest education level: 12th grade  Occupational History  . Occupation: banking  Tobacco Use  . Smoking status: Never Smoker  . Smokeless tobacco: Never Used  Substance and Sexual Activity  . Alcohol use: No  . Drug use: No  . Sexual activity: Yes    Birth control/protection: Surgical    Comment: BTL  Other Topics Concern  . Not on file  Social History Narrative  . Not on file   Social Determinants of Health   Financial Resource Strain:   . Difficulty of Paying Living Expenses: Not on file  Food Insecurity:   . Worried About Charity fundraiser in the Last Year: Not on file  . Ran Out of Food in the Last Year: Not on file  Transportation Needs:   . Lack of Transportation (Medical): Not on file  . Lack of Transportation (Non-Medical): Not on  file  Physical Activity:   . Days of Exercise per Week: Not on file  . Minutes of Exercise per Session: Not on file  Stress:   . Feeling of Stress : Not on file  Social Connections:   . Frequency of Communication with Friends and Family: Not on file  . Frequency of Social Gatherings with Friends and Family: Not on file  . Attends Religious Services: Not on file  . Active Member of Clubs or Organizations: Not on file  . Attends Archivist Meetings: Not on file  . Marital Status: Not on file  Intimate Partner Violence:   . Fear of Current or Ex-Partner: Not on file  . Emotionally Abused: Not on file  . Physically Abused: Not on file  . Sexually Abused: Not on file    Outpatient Medications Prior  to Visit  Medication Sig Dispense Refill  . acetaminophen (TYLENOL) 500 MG tablet Take 2 tablets (1,000 mg total) by mouth every 6 (six) hours as needed. 60 tablet 0  . atorvastatin (LIPITOR) 20 MG tablet Take 1 tablet (20 mg total) by mouth daily. 90 tablet 0  . canagliflozin (INVOKANA) 300 MG TABS tablet Take 1 tablet (300 mg total) by mouth daily before breakfast. 30 tablet 3  . Cholecalciferol (VITAMIN D) 50 MCG (2000 UT) CAPS Take 2,000 Units by mouth daily.    Marland Kitchen escitalopram (LEXAPRO) 20 MG tablet Take 1 tablet (20 mg total) by mouth daily. 90 tablet 0  . hydrochlorothiazide (HYDRODIURIL) 12.5 MG tablet Take 1 tablet (12.5 mg total) by mouth daily. 90 tablet 0  . ibuprofen (ADVIL) 600 MG tablet Take 1 tablet (600 mg total) by mouth every 6 (six) hours as needed for moderate pain. 60 tablet 0  . Insulin Pen Needle (BD PEN NEEDLE NANO 2ND GEN) 32G X 4 MM MISC Use four times daily to inject insulin. 200 each 11  . insulin regular human CONCENTRATED (HUMULIN R U-500 KWIKPEN) 500 UNIT/ML kwikpen 30 units before breakfast, 45 units before lunch, 20-25 units at supper and 30 units at bedtime 2 pen 0  . Iron, Ferrous Sulfate, 325 (65 Fe) MG TABS Take 1 tablet by mouth daily. 90 tablet 1  . Melatonin 5 MG TABS Take 5 mg by mouth at bedtime.    . Semaglutide,0.25 or 0.5MG /DOS, (OZEMPIC, 0.25 OR 0.5 MG/DOSE,) 2 MG/1.5ML SOPN Inject 0.5 mg into the skin once a week. 1 pen 2  . vitamin B-12 (CYANOCOBALAMIN) 1000 MCG tablet Take 1,000 mcg by mouth daily.    Marland Kitchen zinc gluconate 50 MG tablet Take 50 mg by mouth daily.    Marland Kitchen amLODipine (NORVASC) 10 MG tablet Take 1 tablet (10 mg total) by mouth daily. 90 tablet 0  . montelukast (SINGULAIR) 10 MG tablet Take 1 tablet (10 mg total) by mouth at bedtime. 90 tablet 0   No facility-administered medications prior to visit.    Allergies  Allergen Reactions  . Actos [Pioglitazone Hydrochloride] Other (See Comments)    Wt gain  . Lisinopril     Angioedema,  including likely GI involvement  . Metformin And Related Diarrhea and Nausea And Vomiting    ROS Review of Systems  Constitutional: Negative.   Respiratory: Negative.   Cardiovascular: Negative.   Gastrointestinal: Negative.   Endocrine: Negative for polyphagia and polyuria.  Musculoskeletal: Positive for back pain. Negative for myalgias.  Allergic/Immunologic: Negative for immunocompromised state.  Neurological: Negative for weakness and numbness.  Hematological: Does not bruise/bleed easily.  Objective:    Physical Exam  Constitutional: She is oriented to person, place, and time. She appears well-developed and well-nourished. No distress.  HENT:  Head: Normocephalic and atraumatic.  Right Ear: External ear normal.  Left Ear: External ear normal.  Eyes: Conjunctivae are normal. Right eye exhibits no discharge. Left eye exhibits no discharge. No scleral icterus.  Neck: No JVD present. No tracheal deviation present.  Pulmonary/Chest: Effort normal. No stridor.  Neurological: She is alert and oriented to person, place, and time.  Skin: She is not diaphoretic.  Psychiatric: She has a normal mood and affect. Her behavior is normal.    BP (!) 150/70 Comment: per pt  Ht 5\' 3"  (1.6 m)   Wt 197 lb (89.4 kg) Comment: per pt  BMI 34.90 kg/m  Wt Readings from Last 3 Encounters:  01/14/20 197 lb (89.4 kg)  01/13/20 197 lb 6.4 oz (89.5 kg)  12/02/19 203 lb 12.8 oz (92.4 kg)     Health Maintenance Due  Topic Date Due  . OPHTHALMOLOGY EXAM  01/09/1977  . MAMMOGRAM  01/09/2017  . COLONOSCOPY  01/09/2017  . FOOT EXAM  08/29/2019    There are no preventive care reminders to display for this patient.  Lab Results  Component Value Date   TSH 1.497 12/23/2018   Lab Results  Component Value Date   WBC 7.1 12/02/2019   HGB 12.4 12/02/2019   HCT 38.9 12/02/2019   MCV 78.2 12/02/2019   PLT 348.0 12/02/2019   Lab Results  Component Value Date   NA 137 01/09/2020   K  4.3 01/09/2020   CO2 31 01/09/2020   GLUCOSE 268 (H) 01/09/2020   BUN 13 01/09/2020   CREATININE 0.50 01/09/2020   BILITOT 0.4 01/09/2020   ALKPHOS 66 01/09/2020   AST 17 01/09/2020   ALT 28 01/09/2020   PROT 7.4 01/09/2020   ALBUMIN 4.0 01/09/2020   CALCIUM 9.9 01/09/2020   ANIONGAP 10 07/30/2019   GFR 129.06 01/09/2020   Lab Results  Component Value Date   CHOL 138 07/21/2019   Lab Results  Component Value Date   HDL 35.90 (L) 07/21/2019   Lab Results  Component Value Date   LDLCALC 71 07/21/2019   Lab Results  Component Value Date   TRIG 154.0 (H) 07/21/2019   Lab Results  Component Value Date   CHOLHDL 4 07/21/2019   Lab Results  Component Value Date   HGBA1C 10.1 (H) 01/09/2020      Assessment & Plan:   Problem List Items Addressed This Visit      Cardiovascular and Mediastinum   Essential hypertension - Primary   Relevant Medications   amLODipine (NORVASC) 10 MG tablet     Musculoskeletal and Integument   Urticaria   Relevant Medications   montelukast (SINGULAIR) 10 MG tablet     Other   Acute midline low back pain without sciatica   Relevant Medications   meloxicam (MOBIC) 7.5 MG tablet      Meds ordered this encounter  Medications  . amLODipine (NORVASC) 10 MG tablet    Sig: Take 1 tablet (10 mg total) by mouth daily.    Dispense:  90 tablet    Refill:  1    Needs to establish with new PCP  . montelukast (SINGULAIR) 10 MG tablet    Sig: Take 1 tablet (10 mg total) by mouth at bedtime.    Dispense:  90 tablet    Refill:  1    Needs  to establish with new PCP  . meloxicam (MOBIC) 7.5 MG tablet    Sig: Take one daily for 10 days and then as needed with a meal.    Dispense:  30 tablet    Refill:  0    Follow-up: Return follow up in 2 weeks if not improving for a ftf visit.Libby Maw, MD   Virtual Visit via Video Note  I connected with [name] on 01/14/20 at  8:00 AM EST by a video enabled telemedicine  application and verified that I am speaking with the correct person using two identifiers.  Location: Patient: home with husband.  Provider:    I discussed the limitations of evaluation and management by telemedicine and the availability of in person appointments. The patient expressed understanding and agreed to proceed.  History of Present Illness:    Observations/Objective:   Assessment and Plan:   Follow Up Instructions:    I discussed the assessment and treatment plan with the patient. The patient was provided an opportunity to ask questions and all were answered. The patient agreed with the plan and demonstrated an understanding of the instructions.   The patient was advised to call back or seek an in-person evaluation if the symptoms worsen or if the condition fails to improve as anticipated.  I provided 93minutes of non-face-to-face time during this encounter.   Libby Maw, MD

## 2020-02-16 ENCOUNTER — Ambulatory Visit (INDEPENDENT_AMBULATORY_CARE_PROVIDER_SITE_OTHER): Payer: 59 | Admitting: Nurse Practitioner

## 2020-02-16 ENCOUNTER — Encounter: Payer: Self-pay | Admitting: Nurse Practitioner

## 2020-02-16 ENCOUNTER — Other Ambulatory Visit: Payer: Self-pay

## 2020-02-16 VITALS — Ht 63.0 in | Wt 185.0 lb

## 2020-02-16 DIAGNOSIS — K529 Noninfective gastroenteritis and colitis, unspecified: Secondary | ICD-10-CM

## 2020-02-16 MED ORDER — ONDANSETRON HCL 4 MG PO TABS: 4.0000 mg | ORAL_TABLET | Freq: Three times a day (TID) | ORAL | 0 refills | Status: DC | PRN

## 2020-02-16 MED ORDER — PEPTO-BISMOL 524 MG/30ML PO SUSP: 30.0000 mL | Freq: Four times a day (QID) | ORAL | 0 refills | Status: DC | PRN

## 2020-02-16 NOTE — Patient Instructions (Addendum)
Go to ED is symptoms worsening Sent copy of COVID results. Work note provided.  Food Choices to Help Relieve Diarrhea, Adult When you have diarrhea, the foods you eat and your eating habits are very important. Choosing the right foods and drinks can help:  Relieve diarrhea.  Replace lost fluids and nutrients.  Prevent dehydration. What general guidelines should I follow?  Relieving diarrhea  Choose foods with less than 2 g or .07 oz. of fiber per serving.  Limit fats to less than 8 tsp (38 g or 1.34 oz.) a day.  Avoid the following: ? Foods and beverages sweetened with high-fructose corn syrup, honey, or sugar alcohols such as xylitol, sorbitol, and mannitol. ? Foods that contain a lot of fat or sugar. ? Fried, greasy, or spicy foods. ? High-fiber grains, breads, and cereals. ? Raw fruits and vegetables.  Eat foods that are rich in probiotics. These foods include dairy products such as yogurt and fermented milk products. They help increase healthy bacteria in the stomach and intestines (gastrointestinal tract, or GI tract).  If you have lactose intolerance, avoid dairy products. These may make your diarrhea worse.  Take medicine to help stop diarrhea (antidiarrheal medicine) only as told by your health care provider. Replacing nutrients  Eat small meals or snacks every 3-4 hours.  Eat bland foods, such as white rice, toast, or baked potato, until your diarrhea starts to get better. Gradually reintroduce nutrient-rich foods as tolerated or as told by your health care provider. This includes: ? Well-cooked protein foods. ? Peeled, seeded, and soft-cooked fruits and vegetables. ? Low-fat dairy products.  Take vitamin and mineral supplements as told by your health care provider. Preventing dehydration  Start by sipping water or a special solution to prevent dehydration (oral rehydration solution, ORS). Urine that is clear or pale yellow means that you are getting enough  fluid.  Try to drink at least 8-10 cups of fluid each day to help replace lost fluids.  You may add other liquids in addition to water, such as clear juice or decaffeinated sports drinks, as tolerated or as told by your health care provider.  Avoid drinks with caffeine, such as coffee, tea, or soft drinks.  Avoid alcohol. What foods are recommended?     The items listed may not be a complete list. Talk with your health care provider about what dietary choices are best for you. Grains White rice. White, [place], or pita breads (fresh or toasted), including plain rolls, buns, or bagels. White pasta. Saltine, soda, or graham crackers. Pretzels. Low-fiber cereal. Cooked cereals made with water (such as cornmeal, farina, or cream cereals). Plain muffins. Matzo. Melba toast. Zwieback. Vegetables Potatoes (without the skin). Most well-cooked and canned vegetables without skins or seeds. Tender lettuce. Fruits Apple sauce. Fruits canned in juice. Cooked apricots, cherries, grapefruit, peaches, pears, or plums. Fresh bananas and cantaloupe. Meats and other protein foods Baked or boiled chicken. Eggs. Tofu. Fish. Seafood. Smooth nut butters. Ground or well-cooked tender beef, ham, veal, lamb, pork, or poultry. Dairy Plain yogurt, kefir, and unsweetened liquid yogurt. Lactose-free milk, buttermilk, skim milk, or soy milk. Low-fat or nonfat hard cheese. Beverages Water. Low-calorie sports drinks. Fruit juices without pulp. Strained tomato and vegetable juices. Decaffeinated teas. Sugar-free beverages not sweetened with sugar alcohols. Oral rehydration solutions, if approved by your health care provider. Seasoning and other foods Bouillon, broth, or soups made from recommended foods. What foods are not recommended? The items listed may not be a complete list. Talk  with your health care provider about what dietary choices are best for you. Grains Whole grain, whole wheat, bran, or rye breads, rolls,  pastas, and crackers. Wild or brown rice. Whole grain or bran cereals. Barley. Oats and oatmeal. Corn tortillas or taco shells. Granola. Popcorn. Vegetables Raw vegetables. Fried vegetables. Cabbage, broccoli, Brussels sprouts, artichokes, baked beans, beet greens, corn, kale, legumes, peas, sweet potatoes, and yams. Potato skins. Cooked spinach and cabbage. Fruits Dried fruit, including raisins and dates. Raw fruits. Stewed or dried prunes. Canned fruits with syrup. Meat and other protein foods Fried or fatty meats. Deli meats. Chunky nut butters. Nuts and seeds. Beans and lentils. Berniece Salines. Hot dogs. Sausage. Dairy High-fat cheeses. Whole milk, chocolate milk, and beverages made with milk, such as milk shakes. Half-and-half. Cream. sour cream. Ice cream. Beverages Caffeinated beverages (such as coffee, tea, soda, or energy drinks). Alcoholic beverages. Fruit juices with pulp. Prune juice. Soft drinks sweetened with high-fructose corn syrup or sugar alcohols. High-calorie sports drinks. Fats and oils Butter. Cream sauces. Margarine. Salad oils. Plain salad dressings. Olives. Avocados. Mayonnaise. Sweets and desserts Sweet rolls, doughnuts, and sweet breads. Sugar-free desserts sweetened with sugar alcohols such as xylitol and sorbitol. Seasoning and other foods Honey. Hot sauce. Chili powder. Gravy. Cream-based or milk-based soups. Pancakes and waffles. Summary  When you have diarrhea, the foods you eat and your eating habits are very important.  Make sure you get at least 8-10 cups of fluid each day, or enough to keep your urine clear or pale yellow.  Eat bland foods and gradually reintroduce healthy, nutrient-rich foods as tolerated, or as told by your health care provider.  Avoid high-fiber, fried, greasy, or spicy foods. This information is not intended to replace advice given to you by your health care provider. Make sure you discuss any questions you have with your health care  provider. Document Revised: 03/13/2019 Document Reviewed: 11/17/2016 Elsevier Patient Education  [place].

## 2020-02-16 NOTE — Progress Notes (Signed)
Virtual Visit via Telephone Note  I connected with Holly Ortiz on 02/16/20 at 10:45 AM EDT by telephone and verified that I am speaking with the correct person using two identifiers.  Location: Patient: home Provider: office   I discussed the limitations, risks, security and privacy concerns of performing an evaluation and management service by telephone and the availability of in person appointments. I also discussed with the patient that there may be a patient responsible charge related to this service. The patient expressed understanding and agreed to proceed.  CC: N/V/D x 3days  History of Present Illness: Diarrhea  This is a new problem. The current episode started in the past 7 days. The problem occurs 2 to 4 times per day. The problem has been gradually improving. The stool consistency is described as watery. The patient states that diarrhea does not awaken her from sleep. Associated symptoms include abdominal pain, bloating, chills, headaches and increased flatus. Pertinent negatives include no arthralgias, coughing, fever, myalgias, sweats, URI, vomiting or weight loss. Nothing aggravates the symptoms. There are no known risk factors. She has tried nothing for the symptoms.  glucose last night 252, glucose in AM 194 No hx of diverticulosis, no recent change in medication. No melena or hematochezia. Vomiting resolved yesterday, persistent nausea and diarrhea. Had negative COVID test on 02/11/2020 Another COVID test completed this morning. Pending results   Observations/Objective: She is unable to provide any vital signs. Alert and oriented, normal speech. No exam due to telephone appt.  Assessment and Plan: Tarlee was seen today for diarrhea.  Diagnoses and all orders for this visit:  Gastroenteritis -     ondansetron (ZOFRAN) 4 MG tablet; Take 1 tablet (4 mg total) by mouth every 8 (eight) hours as needed for nausea or vomiting. -     bismuth subsalicylate  (PEPTO-BISMOL) 262 MG/15ML suspension; Take 30 mLs by mouth every 6 (six) hours as needed.   Follow Up Instructions: See avs   I discussed the assessment and treatment plan with the patient. The patient was provided an opportunity to ask questions and all were answered. The patient agreed with the plan and demonstrated an understanding of the instructions.   The patient was advised to call back or seek an in-person evaluation if the symptoms worsen or if the condition fails to improve as anticipated.  I provided 11 minutes of non-face-to-face time during this encounter.  Wilfred Lacy, NP

## 2020-02-17 ENCOUNTER — Encounter: Payer: Self-pay | Admitting: Nurse Practitioner

## 2020-02-17 ENCOUNTER — Telehealth: Payer: Self-pay | Admitting: Family Medicine

## 2020-02-17 NOTE — Telephone Encounter (Signed)
Pt printed out the letter from [place].

## 2020-02-17 NOTE — Telephone Encounter (Addendum)
Patient is calling and stated that she was seen by [facility] yesterday and wanted to see if a doctors note be sent through my chart. Also patient stated that she is still not feeling any better. CB is [phone]

## 2020-02-19 ENCOUNTER — Telehealth: Payer: Self-pay | Admitting: Endocrinology

## 2020-02-19 NOTE — Telephone Encounter (Signed)
Please find out what symptoms she is having

## 2020-02-19 NOTE — Telephone Encounter (Signed)
Patient called stating her invokana was increased from 100 to 300 and since this change, she has been having really bad side affects and says she's not sure what to do. She gets tested for covid weekly and she got her last result yesterday and it was negative so she knows its not that. She would please like nurse or Dr Dwyane Dee to call her to advise. [phone]

## 2020-02-19 NOTE — Telephone Encounter (Signed)
She needs to call her PCP right away.  The symptoms are not related to Invokana increase

## 2020-02-19 NOTE — Telephone Encounter (Signed)
Patient called back stating - 2 weeks of terrible constipation - then last week started nausea, vomiting, diarrhea. Patient would like to know if she should continue to take RX or go to hospital or what?

## 2020-02-19 NOTE — Telephone Encounter (Signed)
Please advise 

## 2020-02-19 NOTE — Telephone Encounter (Signed)
Called her and relayed the message.

## 2020-02-24 ENCOUNTER — Other Ambulatory Visit: Payer: Self-pay | Admitting: Family

## 2020-02-24 ENCOUNTER — Other Ambulatory Visit: Payer: Self-pay | Admitting: Family Medicine

## 2020-02-24 DIAGNOSIS — M545 Low back pain, unspecified: Secondary | ICD-10-CM

## 2020-02-24 DIAGNOSIS — I10 Essential (primary) hypertension: Secondary | ICD-10-CM

## 2020-02-24 DIAGNOSIS — F329 Major depressive disorder, single episode, unspecified: Secondary | ICD-10-CM

## 2020-02-24 DIAGNOSIS — F32A Depression, unspecified: Secondary | ICD-10-CM

## 2020-02-26 ENCOUNTER — Other Ambulatory Visit: Payer: Managed Care, Other (non HMO)

## 2020-03-01 ENCOUNTER — Ambulatory Visit: Payer: Managed Care, Other (non HMO) | Admitting: Endocrinology

## 2020-03-11 ENCOUNTER — Other Ambulatory Visit: Payer: Self-pay

## 2020-03-11 DIAGNOSIS — F32A Depression, unspecified: Secondary | ICD-10-CM

## 2020-03-11 DIAGNOSIS — F329 Major depressive disorder, single episode, unspecified: Secondary | ICD-10-CM

## 2020-03-11 MED ORDER — ESCITALOPRAM OXALATE 20 MG PO TABS: 20.0000 mg | ORAL_TABLET | Freq: Every day | ORAL | 0 refills | Status: DC

## 2020-03-28 ENCOUNTER — Other Ambulatory Visit: Payer: Self-pay | Admitting: Family Medicine

## 2020-03-28 ENCOUNTER — Other Ambulatory Visit: Payer: Self-pay | Admitting: Endocrinology

## 2020-03-28 DIAGNOSIS — M545 Low back pain, unspecified: Secondary | ICD-10-CM

## 2020-03-28 DIAGNOSIS — E785 Hyperlipidemia, unspecified: Secondary | ICD-10-CM

## 2020-03-31 ENCOUNTER — Other Ambulatory Visit: Payer: Self-pay

## 2020-03-31 ENCOUNTER — Telehealth: Payer: Self-pay | Admitting: Endocrinology

## 2020-03-31 MED ORDER — CANAGLIFLOZIN 300 MG PO TABS: 300.0000 mg | ORAL_TABLET | Freq: Every day | ORAL | 0 refills | Status: DC

## 2020-03-31 NOTE — Telephone Encounter (Signed)
Medication Refill Request  Did you call your pharmacy and request this refill first? Yes  . If patient has not contacted pharmacy first, instruct them to do so for future refills.  . Remind them that contacting the pharmacy for their refill is the quickest method to get the refill.  . Refill policy also stated that it will take anywhere between 24-72 hours to receive the refill.    Name of medication? invokana  Is this a 90 day supply? yes  Name and location of pharmacy?  [place], [place] - [id number] Phone:  [phone]  Fax:  [phone]

## 2020-03-31 NOTE — Telephone Encounter (Signed)
Rx sent 

## 2020-04-09 ENCOUNTER — Other Ambulatory Visit: Payer: Self-pay

## 2020-04-09 ENCOUNTER — Other Ambulatory Visit (INDEPENDENT_AMBULATORY_CARE_PROVIDER_SITE_OTHER): Payer: 59

## 2020-04-09 DIAGNOSIS — E1165 Type 2 diabetes mellitus with hyperglycemia: Secondary | ICD-10-CM | POA: Diagnosis not present

## 2020-04-09 DIAGNOSIS — Z794 Long term (current) use of insulin: Secondary | ICD-10-CM | POA: Diagnosis not present

## 2020-04-09 LAB — BASIC METABOLIC PANEL
BUN: 15 mg/dL (ref 6–23)
CO2: 30 mEq/L (ref 19–32)
Calcium: 9.6 mg/dL (ref 8.4–10.5)
Chloride: 99 mEq/L (ref 96–112)
Creatinine, Ser: 0.58 mg/dL (ref 0.40–1.20)
GFR: 108.64 mL/min (ref >60.00)
Glucose, Bld: 302 mg/dL — ABNORMAL HIGH (ref 70–99)
Potassium: 4.1 mEq/L (ref 3.5–5.1)
Sodium: 136 mEq/L (ref 135–145)

## 2020-04-10 LAB — FRUCTOSAMINE: Fructosamine: 408 umol/L — ABNORMAL HIGH (ref 0–285)

## 2020-04-12 ENCOUNTER — Telehealth (INDEPENDENT_AMBULATORY_CARE_PROVIDER_SITE_OTHER): Payer: 59 | Admitting: Endocrinology

## 2020-04-12 ENCOUNTER — Other Ambulatory Visit: Payer: Self-pay

## 2020-04-12 DIAGNOSIS — E1165 Type 2 diabetes mellitus with hyperglycemia: Secondary | ICD-10-CM

## 2020-04-12 DIAGNOSIS — Z794 Long term (current) use of insulin: Secondary | ICD-10-CM | POA: Diagnosis not present

## 2020-04-12 DIAGNOSIS — E782 Mixed hyperlipidemia: Secondary | ICD-10-CM | POA: Diagnosis not present

## 2020-04-12 MED ORDER — HUMULIN R U-500 KWIKPEN 500 UNIT/ML ~~LOC~~ SOPN
PEN_INJECTOR | SUBCUTANEOUS | 0 refills | Status: DC
Start: 2020-04-12 — End: 2020-07-13

## 2020-04-12 NOTE — Progress Notes (Signed)
Patient ID: Holly Ortiz, female   DOB: 06/29/1966, 54 y.o.   MRN: [id number]          Reason for Appointment: Follow-up for Type 2 Diabetes  I connected with the above-named patient by video enabled telemedicine application and verified that I am speaking with the correct person. The patient was explained the limitations of evaluation and management by telemedicine and the availability of in person appointments.  Patient also understood that there may be a patient responsible charge related to this service . Location of the patient: Patient's home . Location of the provider: Physician office Only the patient and myself were participating in the encounter The patient understood the above statements and agreed to proceed.   History of Present Illness:          Date of diagnosis of type 2 diabetes mellitus:   At age 69      Background history:  Her diabetes was diagnosed incidentally when she was having unrelated problems She took various oral agents including metformin for several years About 5 years ago she was started on insulin and she apparently was taking only one kind of insulin possibly a premixed insulin twice a day She said that her blood sugars have been usually poorly controlled with A1c usually 8-9% She was also taking metformin, Amaryl and Januvia prior to moving here from [place]  Recent history:    INSULIN regimen is: NovoLog 20 units before meals   Non-insulin hypoglycemic drugs the patient is taking are: Invokana 300 mg daily  Current management, blood sugar patterns and problems identified:   Her A1c was last 10.1, fructosamine is 408   She given a trial of OZEMPIC on her last visit in February but she developed constipation for couple of weeks followed by vomiting and diarrhea  She stopped her Ozempic but also stopped her Humulin R U-500 at the same time and did not let us know that  However she thinks that when she was taking Ozempic and Humulin R  her blood sugars were in the 100+ range  Otherwise blood sugars are back in the 300+ in the morning including in the lab  She claims that she has abdominal swelling and weight gain with Lantus and Basaglar and did have apparently hives with [place] is not covered  She has no difficulty with taking 300 mg Invokana  She forgets to check readings after meals  She uses walking her dogs regularly but no formal exercise  Renal function normal with continuing Invokana        Side effects from medications have been: Hives from [place]  Compliance with the medical regimen: Poor      Glucose monitoring:  done 1-3 times a day         Glucometer: [employer] brand       Blood Glucose readings from her meter as follows  Recent fasting range 302-367, no average available   Dietician visit, most recent: A few years ago in [place]  Weight history:  Wt Readings from Last 3 Encounters:  02/16/20 185 lb (83.9 kg)  01/14/20 197 lb (89.4 kg)  01/13/20 197 lb 6.4 oz (89.5 kg)    Glycemic control:   Lab Results  Component Value Date   HGBA1C 10.1 (H) 01/09/2020   HGBA1C 9.6 (H) 09/01/2019   HGBA1C 10.0 (H) 08/08/2019   Lab Results  Component Value Date   MICROALBUR 2.0 (H) 01/09/2020   LDLCALC 71 07/21/2019   CREATININE 0.58  04/09/2020   Lab Results  Component Value Date   MICRALBCREAT 4.3 01/09/2020    Lab Results  Component Value Date   FRUCTOSAMINE 408 (H) 04/09/2020   FRUCTOSAMINE 325 (H) 07/21/2019    Lab on 04/09/2020  Component Date Value Ref Range Status  . Sodium 04/09/2020 136  135 - 145 mEq/L Final  . Potassium 04/09/2020 4.1  3.5 - 5.1 mEq/L Final  . Chloride 04/09/2020 99  96 - 112 mEq/L Final  . CO2 04/09/2020 30  19 - 32 mEq/L Final  . Glucose, Bld 04/09/2020 302* 70 - 99 mg/dL Final  . BUN 04/09/2020 15  6 - 23 mg/dL Final  . Creatinine, Ser 04/09/2020 0.58  0.40 - 1.20 mg/dL Final  . GFR 04/09/2020 108.64  >60.00 mL/min Final  . Calcium  04/09/2020 9.6  8.4 - 10.5 mg/dL Final  . Fructosamine 04/09/2020 408* 0 - 285 umol/L Final   Comment: Published reference interval for apparently healthy subjects between age 31 and 29 is 65 - 285 umol/L and in a poorly controlled diabetic population is 228 - 563 umol/L with a mean of 396 umol/L.     Allergies as of 04/12/2020      Reactions   Actos [pioglitazone Hydrochloride] Other (See Comments)   Wt gain   Lisinopril    Angioedema, including likely GI involvement   Metformin And Related Diarrhea, Nausea And Vomiting      Medication List       Accurate as of Apr 12, 2020  8:12 AM. If you have any questions, ask your nurse or doctor.        acetaminophen 500 MG tablet Commonly known as: TYLENOL Take 2 tablets (1,000 mg total) by mouth every 6 (six) hours as needed.   amLODipine 10 MG tablet Commonly known as: NORVASC Take 1 tablet (10 mg total) by mouth daily.   atorvastatin 20 MG tablet Commonly known as: LIPITOR Take 1 tablet by mouth once daily   BD Pen Needle Nano 2nd Gen 32G X 4 MM Misc Generic drug: Insulin Pen Needle Use four times daily to inject insulin.   canagliflozin 300 MG Tabs tablet Commonly known as: Invokana Take 1 tablet (300 mg total) by mouth daily before breakfast.   escitalopram 20 MG tablet Commonly known as: LEXAPRO Take 1 tablet (20 mg total) by mouth daily.   HumuLIN R U-500 KwikPen 500 UNIT/ML kwikpen Generic drug: insulin regular human CONCENTRATED 30 units before breakfast, 45 units before lunch, 20-25 units at supper and 30 units at bedtime   hydrochlorothiazide 12.5 MG tablet Commonly known as: HYDRODIURIL Take 1 tablet by mouth once daily   ibuprofen 600 MG tablet Commonly known as: ADVIL Take 1 tablet (600 mg total) by mouth every 6 (six) hours as needed for moderate pain.   Iron (Ferrous Sulfate) 325 (65 Fe) MG Tabs Take 1 tablet by mouth daily.   melatonin 5 MG Tabs Take 5 mg by mouth at bedtime.   meloxicam 7.5  MG tablet Commonly known as: MOBIC TAKE 1 TABLET BY MOUTH ONCE DAILY FOR 10 DAYS AND  THEN  AS  NEEDED  WITH  A  MEAL   montelukast 10 MG tablet Commonly known as: SINGULAIR Take 1 tablet (10 mg total) by mouth at bedtime.   ondansetron 4 MG tablet Commonly known as: Zofran Take 1 tablet (4 mg total) by mouth every 8 (eight) hours as needed for nausea or vomiting.   Ozempic (0.25 or 0.5 MG/DOSE) 2 MG/1.5ML  Sopn Generic drug: Semaglutide(0.25 or 0.5MG /DOS) Inject 0.5 mg into the skin once a week.   Pepto-Bismol 262 MG/15ML suspension Generic drug: bismuth subsalicylate Take 30 mLs by mouth every 6 (six) hours as needed.   vitamin B-12 1000 MCG tablet Commonly known as: CYANOCOBALAMIN Take 1,000 mcg by mouth daily.   Vitamin D 50 MCG (2000 UT) Caps Take 2,000 Units by mouth daily.   zinc gluconate 50 MG tablet Take 50 mg by mouth daily.       Allergies:  Allergies  Allergen Reactions  . Actos [Pioglitazone Hydrochloride] Other (See Comments)    Wt gain  . Lisinopril     Angioedema, including likely GI involvement  . Metformin And Related Diarrhea and Nausea And Vomiting    Past Medical History:  Diagnosis Date  . Allergy   . 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-I)   . Anxiety   . Asthma   . Diabetes mellitus    type II  . Hyperlipidemia   . Hypertension   . Urticaria, chronic   . UTI (lower urinary tract infection)     Past Surgical History:  Procedure Laterality Date  . ABDOMINAL HYSTERECTOMY  08/07/2019  . breast lift  1993  . CATARACT EXTRACTION, BILATERAL Bilateral 2014  . LAPAROSCOPIC VAGINAL HYSTERECTOMY WITH SALPINGO OOPHORECTOMY Bilateral 08/07/2019   Procedure: LAPAROSCOPIC ASSISTED VAGINAL HYSTERECTOMY WITH SALPINGO OOPHORECTOMY, possible abdominal hysterectomy;  Surgeon: Everlene Farrier, MD;  Location: [facility];  Service: Gynecology;  Laterality: Bilateral;  possible abdominal hysterectomy pt is diabetic and hypertensive  .  SOFT TISSUE MASS EXCISION Right 2016   [place] area  . TUBAL LIGATION  1995  . tummy tuck  1993    Family History  Problem Relation Age of Onset  . Hyperlipidemia Other   . Heart disease Other   . Diabetes Father   . Hypertension Father   . COPD Father        Cause of death  . Colon cancer Mother   . Hypertension Mother   . Hypertension Sister   . Diabetes Sister   . Hypertension Brother   . Cancer Sister        Brain glioblastoma  . Diabetes Sister     Social History:  reports that she has never smoked. She has never used smokeless tobacco. She reports that she does not drink alcohol or use drugs.   Review of Systems   Lipid history: On Lipitor 20 mg from her PCP   She last has LDL of 71    Lab Results  Component Value Date   CHOL 138 07/21/2019   HDL 35.90 (L) 07/21/2019   LDLCALC 71 07/21/2019   LDLDIRECT 129.0 07/03/2018   TRIG 154.0 (H) 07/21/2019   CHOLHDL 4 07/21/2019           Hypertension: Has been present since age 40, on amlodipine and HCTZ 12.5mg  prescribed by her PCP  May have had angioedema with lisinopril   BP Readings from Last 3 Encounters:  01/14/20 (!) 150/70  01/13/20 (!) 150/80  12/02/19 120/72    Most recent eye exam was in 2017  Most recent foot exam: 9/19  Currently known complications of diabetes: None  LABS:  Lab on 04/09/2020  Component Date Value Ref Range Status  . Sodium 04/09/2020 136  135 - 145 mEq/L Final  . Potassium 04/09/2020 4.1  3.5 - 5.1 mEq/L Final  . Chloride 04/09/2020 99  96 - 112 mEq/L Final  . CO2 04/09/2020  30  19 - 32 mEq/L Final  . Glucose, Bld 04/09/2020 302* 70 - 99 mg/dL Final  . BUN 04/09/2020 15  6 - 23 mg/dL Final  . Creatinine, Ser 04/09/2020 0.58  0.40 - 1.20 mg/dL Final  . GFR 04/09/2020 108.64  >60.00 mL/min Final  . Calcium 04/09/2020 9.6  8.4 - 10.5 mg/dL Final  . Fructosamine 04/09/2020 408* 0 - 285 umol/L Final   Comment: Published reference interval for apparently healthy subjects  between age 11 and 49 is 95 - 285 umol/L and in a poorly controlled diabetic population is 228 - 563 umol/L with a mean of 396 umol/L.     Physical Examination:  There were no vitals taken for this visit.      ASSESSMENT:  Diabetes type 2, with history of persistent hyperglycemia, insulin-dependent  See history of present illness for detailed discussion of current diabetes management, blood sugar patterns and problems identified  A1c is previously 10.1 Fructosamine is 408 indicating persistent hyperglycemia  She did not follow-up as directed and is now coming back 3 months later with blood sugars over 300 consistently in the morning As discussed above she has reported intolerances to basal insulin and [place] is not covered However even though she was having better readings with Humulin R she stopped this and did not notify us As before she is checking blood sugars only in the morning  Hypertension: Needs follow-up office blood pressure measurement and she will need to schedule with PCP   HYPERLIPIDEMIA: controlled previously and will need follow-up labs  PLAN:   Check blood sugars consistently at different times of the day especially after meals Discussed needing to try taking her Humulin R as directed before before each meal and at bedtime Explained to her that this is going to improve her overnight blood sugars if she starts taking a bedtime dose and this will be increased weekly by 5 units until morning sugars are below at least 200 Also discussed needing to control blood sugars after meals Encourage her to start walking briskly for exercise Continue with Invokana unchanged as renal function is stable More regular follow-up and will review her blood sugars from her meter in about 3 weeks Consider consultation with dietitian She will send [place] a message on my chart if she is having difficulty with her medications or persistently abnormal blood sugar patterns Her blood  sugars are difficult to control may consider Victoza which can the adjusted more gradually     There are no Patient Instructions on file for this visit.   Elayne Snare 04/12/2020, 8:12 AM   Note: This office note was prepared with Dragon voice recognition system technology. Any transcriptional errors that result from this process are unintentional.

## 2020-05-12 ENCOUNTER — Other Ambulatory Visit: Payer: 59

## 2020-05-12 ENCOUNTER — Other Ambulatory Visit: Payer: Self-pay | Admitting: Family Medicine

## 2020-05-12 DIAGNOSIS — M545 Low back pain, unspecified: Secondary | ICD-10-CM

## 2020-05-14 ENCOUNTER — Ambulatory Visit: Payer: 59 | Admitting: Endocrinology

## 2020-05-14 DIAGNOSIS — Z0289 Encounter for other administrative examinations: Secondary | ICD-10-CM

## 2020-07-07 ENCOUNTER — Ambulatory Visit: Payer: 59 | Admitting: Family Medicine

## 2020-07-07 ENCOUNTER — Encounter: Payer: Self-pay | Admitting: Family Medicine

## 2020-07-07 ENCOUNTER — Other Ambulatory Visit: Payer: Self-pay

## 2020-07-07 VITALS — BP 126/72 | HR 93 | Temp 96.8°F | Ht 63.0 in | Wt 199.0 lb

## 2020-07-07 DIAGNOSIS — E118 Type 2 diabetes mellitus with unspecified complications: Secondary | ICD-10-CM | POA: Diagnosis not present

## 2020-07-07 DIAGNOSIS — D649 Anemia, unspecified: Secondary | ICD-10-CM

## 2020-07-07 DIAGNOSIS — E7800 Pure hypercholesterolemia, unspecified: Secondary | ICD-10-CM | POA: Diagnosis not present

## 2020-07-07 DIAGNOSIS — E611 Iron deficiency: Secondary | ICD-10-CM | POA: Diagnosis not present

## 2020-07-07 DIAGNOSIS — I10 Essential (primary) hypertension: Secondary | ICD-10-CM

## 2020-07-07 DIAGNOSIS — Z794 Long term (current) use of insulin: Secondary | ICD-10-CM

## 2020-07-07 LAB — COMPREHENSIVE METABOLIC PANEL
ALT: 24 U/L (ref 0–35)
AST: 16 U/L (ref 0–37)
Albumin: 3.9 g/dL (ref 3.5–5.2)
Alkaline Phosphatase: 64 U/L (ref 39–117)
BUN: 12 mg/dL (ref 6–23)
CO2: 30 mEq/L (ref 19–32)
Calcium: 9.5 mg/dL (ref 8.4–10.5)
Chloride: 100 mEq/L (ref 96–112)
Creatinine, Ser: 0.48 mg/dL (ref 0.40–1.20)
GFR: 135.04 mL/min (ref >60.00)
Glucose, Bld: 204 mg/dL — ABNORMAL HIGH (ref 70–99)
Potassium: 4.2 mEq/L (ref 3.5–5.1)
Sodium: 138 mEq/L (ref 135–145)
Total Bilirubin: 0.4 mg/dL (ref 0.2–1.2)
Total Protein: 6.8 g/dL (ref 6.0–8.3)

## 2020-07-07 LAB — LIPID PANEL
Cholesterol: 176 mg/dL (ref 0–200)
HDL: 37 mg/dL — ABNORMAL LOW (ref >39.00)
NonHDL: 139.44
Total CHOL/HDL Ratio: 5
Triglycerides: 309 mg/dL — ABNORMAL HIGH (ref 0.0–149.0)
VLDL: 61.8 mg/dL — ABNORMAL HIGH (ref 0.0–40.0)

## 2020-07-07 LAB — VITAMIN B12: Vitamin B-12: 391 pg/mL (ref 211–911)

## 2020-07-07 LAB — LDL CHOLESTEROL, DIRECT: Direct LDL: 98 mg/dL

## 2020-07-07 LAB — HEMOGLOBIN A1C: Hgb A1c MFr Bld: 11.2 % — ABNORMAL HIGH (ref 4.6–6.5)

## 2020-07-07 LAB — CBC
HCT: 40.6 % (ref 36.0–46.0)
Hemoglobin: 13.1 g/dL (ref 12.0–15.0)
MCHC: 32.3 g/dL (ref 30.0–36.0)
MCV: 81.9 fl (ref 78.0–100.0)
Platelets: 260 10*3/uL (ref 150.0–400.0)
RBC: 4.95 Mil/uL (ref 3.87–5.11)
RDW: 14.2 % (ref 11.5–15.5)
WBC: 6.1 10*3/uL (ref 4.0–10.5)

## 2020-07-07 NOTE — Patient Instructions (Signed)
Exercising to Lose Weight Exercise is structured, repetitive physical activity to improve fitness and health. Getting regular exercise is important for everyone. It is especially important if you are overweight. Being overweight increases your risk of heart disease, stroke, diabetes, high blood pressure, and several types of cancer. Reducing your calorie intake and exercising can help you lose weight. Exercise is usually categorized as moderate or vigorous intensity. To lose weight, most people need to do a certain amount of moderate-intensity or vigorous-intensity exercise each week. Moderate-intensity exercise  Moderate-intensity exercise is any activity that gets you moving enough to burn at least three times more energy (calories) than if you were sitting. Examples of moderate exercise include:  Walking a mile in 15 minutes.  Doing light yard work.  Biking at an easy pace. Most people should get at least 150 minutes (2 hours and 30 minutes) a week of moderate-intensity exercise to maintain their body weight. Vigorous-intensity exercise Vigorous-intensity exercise is any activity that gets you moving enough to burn at least six times more calories than if you were sitting. When you exercise at this intensity, you should be working hard enough that you are not able to carry on a conversation. Examples of vigorous exercise include:  Running.  Playing a team sport, such as football, basketball, and soccer.  Jumping rope. Most people should get at least 75 minutes (1 hour and 15 minutes) a week of vigorous-intensity exercise to maintain their body weight. How can exercise affect me? When you exercise enough to burn more calories than you eat, you lose weight. Exercise also reduces body fat and builds muscle. The more muscle you have, the more calories you burn. Exercise also:  Improves mood.  Reduces stress and tension.  Improves your overall fitness, flexibility, and  endurance.  Increases bone strength. The amount of exercise you need to lose weight depends on:  Your age.  The type of exercise.  Any health conditions you have.  Your overall physical ability. Talk to your health care provider about how much exercise you need and what types of activities are safe for you. What actions can I take to lose weight? Nutrition   Make changes to your diet as told by your health care provider or diet and nutrition specialist (dietitian). This may include: ? Eating fewer calories. ? Eating more protein. ? Eating less unhealthy fats. ? Eating a diet that includes fresh fruits and vegetables, whole grains, low-fat dairy products, and lean protein. ? Avoiding foods with added fat, salt, and sugar.  Drink plenty of water while you exercise to prevent dehydration or heat stroke. Activity  Choose an activity that you enjoy and set realistic goals. Your health care provider can help you make an exercise plan that works for you.  Exercise at a moderate or vigorous intensity most days of the week. ? The intensity of exercise may vary from person to person. You can tell how intense a workout is for you by paying attention to your breathing and heartbeat. Most people will notice their breathing and heartbeat get faster with more intense exercise.  Do resistance training twice each week, such as: ? Push-ups. ? Sit-ups. ? Lifting weights. ? Using resistance bands.  Getting short amounts of exercise can be just as helpful as long structured periods of exercise. If you have trouble finding time to exercise, try to include exercise in your daily routine. ? Get up, stretch, and walk around every 30 minutes throughout the day. ? Go for a   walk during your lunch break. ? Park your car farther away from your destination. ? If you take public transportation, get off one stop early and walk the rest of the way. ? Make phone calls while standing up and walking  around. ? Take the stairs instead of elevators or escalators.  Wear comfortable clothes and shoes with good support.  Do not exercise so much that you hurt yourself, feel dizzy, or get very short of breath. Where to find more information  [place] Department of Health and Human Services: www.hhs.gov  Centers for Disease Control and Prevention (CDC): www.cdc.gov Contact a health care provider:  Before starting a new exercise program.  If you have questions or concerns about your weight.  If you have a medical problem that keeps you from exercising. Get help right away if you have any of the following while exercising:  Injury.  Dizziness.  Difficulty breathing or shortness of breath that does not go away when you stop exercising.  Chest pain.  Rapid heartbeat. Summary  Being overweight increases your risk of heart disease, stroke, diabetes, high blood pressure, and several types of cancer.  Losing weight happens when you burn more calories than you eat.  Reducing the amount of calories you eat in addition to getting regular moderate or vigorous exercise each week helps you lose weight. This information is not intended to replace advice given to you by your health care provider. Make sure you discuss any questions you have with your health care provider. Document Revised: 12/03/2017 Document Reviewed: 12/03/2017 Elsevier Patient Education  2020 [employer] Calorie Counting for Weight Loss Calories are units of energy. Your body needs a certain amount of calories from food to keep you going throughout the day. When you eat more calories than your body needs, your body stores the extra calories as fat. When you eat fewer calories than your body needs, your body burns fat to get the energy it needs. Calorie counting means keeping track of how many calories you eat and drink each day. Calorie counting can be helpful if you need to lose weight. If you make sure to eat fewer calories  than your body needs, you should lose weight. Ask your health care provider what a healthy weight is for you. For calorie counting to work, you will need to eat the right number of calories in a day in order to lose a healthy amount of weight per week. A dietitian can help you determine how many calories you need in a day and will give you suggestions on how to reach your calorie goal.  A healthy amount of weight to lose per week is usually 1-2 lb (0.5-0.9 kg). This usually means that your daily calorie intake should be reduced by 500-750 calories.  Eating 1,200 - 1,500 calories per day can help most women lose weight.  Eating 1,500 - 1,800 calories per day can help most men lose weight. What is my plan? My goal is to have __________ calories per day. If I have this many calories per day, I should lose around __________ pounds per week. What do I need to know about calorie counting? In order to meet your daily calorie goal, you will need to:  Find out how many calories are in each food you would like to eat. Try to do this before you eat.  Decide how much of the food you plan to eat.  Write down what you ate and how many calories it had. Doing this   is called keeping a food log. To successfully lose weight, it is important to balance calorie counting with a healthy lifestyle that includes regular activity. Aim for 150 minutes of moderate exercise (such as walking) or 75 minutes of vigorous exercise (such as running) each week. Where do I find calorie information?  The number of calories in a food can be found on a Nutrition Facts label. If a food does not have a Nutrition Facts label, try to look up the calories online or ask your dietitian for help. Remember that calories are listed per serving. If you choose to have more than one serving of a food, you will have to multiply the calories per serving by the amount of servings you plan to eat. For example, the label on a package of bread might  say that a serving size is 1 slice and that there are 90 calories in a serving. If you eat 1 slice, you will have eaten 90 calories. If you eat 2 slices, you will have eaten 180 calories. How do I keep a food log? Immediately after each meal, record the following information in your food log:  What you ate. Don't forget to include toppings, sauces, and other extras on the food.  How much you ate. This can be measured in cups, ounces, or number of items.  How many calories each food and drink had.  The total number of calories in the meal. Keep your food log near you, such as in a small notebook in your pocket, or use a mobile app or website. Some programs will calculate calories for you and show you how many calories you have left for the day to meet your goal. What are some calorie counting tips?   Use your calories on foods and drinks that will fill you up and not leave you hungry: ? Some examples of foods that fill you up are nuts and nut butters, vegetables, lean proteins, and high-fiber foods like whole grains. High-fiber foods are foods with more than 5 g fiber per serving. ? Drinks such as sodas, specialty coffee drinks, alcohol, and juices have a lot of calories, yet do not fill you up.  Eat nutritious foods and avoid empty calories. Empty calories are calories you get from foods or beverages that do not have many vitamins or protein, such as candy, sweets, and soda. It is better to have a nutritious high-calorie food (such as an avocado) than a food with few nutrients (such as a bag of chips).  Know how many calories are in the foods you eat most often. This will help you calculate calorie counts faster.  Pay attention to calories in drinks. Low-calorie drinks include water and unsweetened drinks.  Pay attention to nutrition labels for "low fat" or "fat free" foods. These foods sometimes have the same amount of calories or more calories than the full fat versions. They also often  have added sugar, starch, or salt, to make up for flavor that was removed with the fat.  Find a way of tracking calories that works for you. Get creative. Try different apps or programs if writing down calories does not work for you. What are some portion control tips?  Know how many calories are in a serving. This will help you know how many servings of a certain food you can have.  Use a measuring cup to measure serving sizes. You could also try weighing out portions on a kitchen scale. With time, you will be able   to estimate serving sizes for some foods.  Take some time to put servings of different foods on your favorite plates, bowls, and cups so you know what a serving looks like.  Try not to eat straight from a bag or box. Doing this can lead to overeating. Put the amount you would like to eat in a cup or on a plate to make sure you are eating the right portion.  Use smaller plates, glasses, and bowls to prevent overeating.  Try not to multitask (for example, watch TV or use your computer) while eating. If it is time to eat, sit down at a table and enjoy your food. This will help you to know when you are full. It will also help you to be aware of what you are eating and how much you are eating. What are tips for following this plan? Reading food labels  Check the calorie count compared to the serving size. The serving size may be smaller than what you are used to eating.  Check the source of the calories. Make sure the food you are eating is high in vitamins and protein and low in saturated and trans fats. Shopping  Read nutrition labels while you shop. This will help you make healthy decisions before you decide to purchase your food.  Make a grocery list and stick to it. Cooking  Try to cook your favorite foods in a healthier way. For example, try baking instead of frying.  Use low-fat dairy products. Meal planning  Use more fruits and vegetables. Half of your plate should be  fruits and vegetables.  Include lean proteins like poultry and fish. How do I count calories when eating out?  Ask for smaller portion sizes.  Consider sharing an entree and sides instead of getting your own entree.  If you get your own entree, eat only half. Ask for a box at the beginning of your meal and put the rest of your entree in it so you are not tempted to eat it.  If calories are listed on the menu, choose the lower calorie options.  Choose dishes that include vegetables, fruits, whole grains, low-fat dairy products, and lean protein.  Choose items that are boiled, broiled, grilled, or steamed. Stay away from items that are buttered, battered, fried, or served with cream sauce. Items labeled "crispy" are usually fried, unless stated otherwise.  Choose water, low-fat milk, unsweetened iced tea, or other drinks without added sugar. If you want an alcoholic beverage, choose a lower calorie option such as a glass of wine or light beer.  Ask for dressings, sauces, and syrups on the side. These are usually high in calories, so you should limit the amount you eat.  If you want a salad, choose a garden salad and ask for grilled meats. Avoid extra toppings like bacon, cheese, or fried items. Ask for the dressing on the side, or ask for olive oil and vinegar or lemon to use as dressing.  Estimate how many servings of a food you are given. For example, a serving of cooked rice is  cup or about the size of half a baseball. Knowing serving sizes will help you be aware of how much food you are eating at restaurants. The list below tells you how big or small some common portion sizes are based on everyday objects: ? 1 oz--4 stacked dice. ? 3 oz--1 deck of cards. ? 1 tsp--1 die. ? 1 Tbsp-- a ping-pong ball. ? 2 Tbsp--1 ping-pong ball. ?    ball. ?  cup-- baseball. ? 1 cup--1 baseball. Summary  Calorie counting means keeping track of how many calories you eat and drink each day. If  you eat fewer calories than your body needs, you should lose weight.  A healthy amount of weight to lose per week is usually 1-2 lb (0.5-0.9 kg). This usually means reducing your daily calorie intake by 500-750 calories.  The number of calories in a food can be found on a Nutrition Facts label. If a food does not have a Nutrition Facts label, try to look up the calories online or ask your dietitian for help.  Use your calories on foods and drinks that will fill you up, and not on foods and drinks that will leave you hungry.  Use smaller plates, glasses, and bowls to prevent overeating. This information is not intended to replace advice given to you by your health care provider. Make sure you discuss any questions you have with your health care provider. Document Revised: 08/09/2018 Document Reviewed: 10/20/2016 Elsevier Patient Education  2020 [employer].  Chronic Constipation  Chronic constipation is a condition in which a person has three or fewer bowel movements a week, for three months or longer. This condition is especially common in older adults. The two main kinds of chronic constipation are secondary constipation and functional constipation. Secondary constipation results from another condition or a treatment. Functional constipation, also called primary or idiopathic constipation, is divided into three types:  Normal transit constipation. In this type, movement of stool through the colon (stool transit) occurs normally.  Slow transit constipation. In this type, stool moves slowly through the colon.  Outlet constipation or pelvic floor dysfunction. In this type, the nerves and muscles that empty the rectum do not work normally. What are the causes? Causes of secondary constipation may include:  Failing to drink enough fluid, eat enough food or fiber, or get physically active.  Pregnancy.  A tear in the anus (anal fissure).  Blockage in the bowel (bowel  obstruction).  Narrowing of the bowel (bowel stricture).  Having a long-term medical condition, such as: ? Diabetes. ? Hypothyroidism. ? Multiple sclerosis. ? Parkinson disease. ? Stroke. ? Spinal cord injury. ? Dementia. ? Colon cancer. ? Inflammatory bowel disease (IBD). ? Iron-deficiency anemia. ? Outward collapse of the rectum (rectal prolapse). ? Hemorrhoids.  Taking certain medicines, including: ? Narcotics. These are a certain type of prescription pain medicine. ? Antacids. ? Iron supplements. ? Water pills (diuretics). ? Certain blood pressure medicines. ? Anti-seizure medicines. ? Antidepressants. ? Medicines for Parkinson disease. The cause of functional constipation is not known, but some conditions are associated with it. These conditions include:  Stress.  Problems in the nerves and muscles that control stool transit.  Weak or impaired pelvic floor muscles. What increases the risk? You may be at higher risk for chronic constipation if you:  Are older than age 50.  Are female.  Live in a long-term care facility.  Do not get much exercise or physical activity (have a sedentary lifestyle).  Do not drink enough fluids.  Do not eat enough food, especially fiber.  Have a long-term disease.  Have a mental health disorder or eating disorder.  Take many medicines. What are the signs or symptoms? The main symptom of chronic constipation is having three or fewer bowel movements a week for several weeks. Other signs and symptoms may vary from person to person. These include:  Pushing hard (straining) to pass stool.  Painful bowel  movements.  Having hard or lumpy stools.  Having lower belly discomfort, such as cramps or bloating.  Being unable to have a bowel movement when you feel the urge.  Feeling like you still need to pass stool after a bowel movement.  Feeling that you have something in your rectum that is blocking or preventing bowel  movements.  Seeing blood on the toilet paper or in your stool.  Worsening confusion (in older adults). How is this diagnosed? This condition may be diagnosed based on:  Symptoms and medical history. You will be asked about your symptoms, lifestyle, diet, and any medicines that you are taking.  Physical exam. ? Your belly (abdomen) will be examined. ? A digital rectal exam may be done. For this exam, a health care provider places a lubricated, gloved finger into the rectum.  Other tests to check for any underlying causes of your constipation. These may be ordered if you have bleeding in your rectum, weight loss, or a family history of colon cancer. In these cases, you may have: ? Imaging studies of the colon. These may include X-ray, ultrasound, or CT scan. ? Blood tests. ? A procedure to examine the inside of your colon (colonoscopy). ? More specialized tests to check:  Whether your anal sphincter works well. This is a ring-shaped muscle that controls the closing of the anus.  How well food moves through your colon. ? Tests to measure the nerve signal in your pelvic floor muscles (electromyography). How is this treated? Treatment for chronic constipation depends on the cause. Most often, treatment starts with:  Being more active and getting regular exercise.  Drinking more fluids.  Adding fiber to your diet. Sources of fiber include fruits, vegetables, whole grains, and fiber supplements.  Using medicines such as stool softeners or medicines that increase contractions in your digestive system (pro-motility agents).  Training your pelvic muscles with biofeedback.  Surgery, if there is obstruction. Treatment for secondary chronic constipation depends on the underlying condition. You may need to:  Stop or change some medicines if they cause constipation.  Use a fiber supplement (bulk laxative) or stool softener.  Use prescription laxative. This works by [employer] into  your colon (osmotic laxative). You may also need to see a specialist who treats conditions of the digestive system (gastroenterologist). Follow these instructions at home:   Take over-the-counter and prescription medicines only as told by your health care provider.  If you are taking a laxative, take it as told by your health care provider.  Eat a balanced diet that includes enough fiber. Ask your health care provider to recommend a diet that is right for you.  Drink clear fluids, especially water. Avoid drinking alcohol, caffeine, and soda.  Drink enough fluid to keep your urine pale yellow.  Get some physical activity every day. Ask your health care provider what physical activities are safe for you.  Get colon cancer screenings as told by your health care provider.  Keep all follow-up visits as told by your health care provider. This is important. Contact a health care provider if:  You are having three or fewer bowel movements a week.  Your stools are hard or lumpy.  You notice blood on the toilet paper or in your stool after you have a bowel movement.  You have unexplained weight loss.  You have rectum (rectal) pain.  You have stool leakage.  You experience nausea or vomiting. Get help right away if:  You have rectal bleeding or you  pass blood clots.  You have severe rectal pain.  You have body tissue that pushes out (protrudes) from your anus.  You have severe pain or bloating (distension) in your abdomen.  You have vomiting that you cannot control. Summary  Chronic constipation is a condition in which a person has three or fewer bowel movements a week, for three months or longer.  You may have a higher risk for this condition if you are an older adult, or if you do not drink enough water or get enough physical activity (are sedentary).  Treatment for this condition depends on the cause. Most treatments for chronic constipation include adding fiber to your  diet, drinking more fluids, and getting more physical activity. You may also need to treat any underlying medical conditions or stop or change certain medicines if they cause constipation.  If lifestyle changes do not relieve constipation, your health care provider may recommend taking a laxative. This information is not intended to replace advice given to you by your health care provider. Make sure you discuss any questions you have with your health care provider. Document Revised: 11/02/2017 Document Reviewed: 08/07/2017 Else[employer] Patient Education  [employer].

## 2020-07-07 NOTE — Progress Notes (Addendum)
Established Patient Office Visit  Subjective:  Patient ID: Holly Ortiz, female    DOB: [birth year]  Age: 54 y.o. MRN: [id number]  CC:  Chief Complaint  Patient presents with  . Follow-up    Refill on medication, C/O constipation issues, stomach discomfort x 4-5 months.     HPI Holly Ortiz presents for follow-up of her hypertension, elevated cholesterol, hair loss and constipation.  Ongoing history of intermittent constipation.  She has had multiple colonoscopies that have been negative.  Her mom passed from colon cancer.  There is been no blood in her stool.  Diabetes has not been controlled well.  She is currently seeing the endocrinologist.  Has not been able to exercise much.  TSH was normal in 2020.  Blood pressure has been controlled with her current regimen.  Atorvastatin has controlled her cholesterol.  Status post recent eye check without a dilated exam.  She has been stressed.  Recent TAH in 2020.  Past Medical History:  Diagnosis Date  . Allergy   . 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-I)   . Anxiety   . Asthma   . Diabetes mellitus    type II  . Hyperlipidemia   . Hypertension   . Urticaria, chronic   . UTI (lower urinary tract infection)     Past Surgical History:  Procedure Laterality Date  . ABDOMINAL HYSTERECTOMY  08/07/2019  . breast lift  1993  . CATARACT EXTRACTION, BILATERAL Bilateral 2014  . LAPAROSCOPIC VAGINAL HYSTERECTOMY WITH SALPINGO OOPHORECTOMY Bilateral 08/07/2019   Procedure: LAPAROSCOPIC ASSISTED VAGINAL HYSTERECTOMY WITH SALPINGO OOPHORECTOMY, possible abdominal hysterectomy;  Surgeon: Everlene Farrier, MD;  Location: [facility];  Service: Gynecology;  Laterality: Bilateral;  possible abdominal hysterectomy pt is diabetic and hypertensive  . SOFT TISSUE MASS EXCISION Right 2016   [place]  . TUBAL LIGATION  1995  . tummy tuck  1993    Family History  Problem Relation Age of Onset  . Hyperlipidemia Other   .  Heart disease Other   . Diabetes Father   . Hypertension Father   . COPD Father        Cause of death  . Colon cancer Mother   . Hypertension Mother   . Hypertension Sister   . Diabetes Sister   . Hypertension Brother   . Cancer Sister        Brain glioblastoma  . Diabetes Sister     Social History   Socioeconomic History  . Marital status: Married    Spouse name: Audelia Hives  . Number of children: 4  . Years of education: 13--1.5 years of college  . Highest education level: 12th grade  Occupational History  . Occupation: banking  Tobacco Use  . Smoking status: Never Smoker  . Smokeless tobacco: Never Used  Vaping Use  . Vaping Use: Never used  Substance and Sexual Activity  . Alcohol use: No  . Drug use: No  . Sexual activity: Yes    Birth control/protection: Surgical    Comment: BTL  Other Topics Concern  . Not on file  Social History Narrative  . Not on file   Social Determinants of Health   Financial Resource Strain:   . Difficulty of Paying Living Expenses:   Food Insecurity:   . Worried About Charity fundraiser in the Last Year:   . Arboriculturist in the Last Year:   Transportation Needs:   . Film/video editor (Medical):   Marland Kitchen Lack  of Transportation (Non-Medical):   Physical Activity:   . Days of Exercise per Week:   . Minutes of Exercise per Session:   Stress:   . Feeling of Stress :   Social Connections:   . Frequency of Communication with Friends and Family:   . Frequency of Social Gatherings with Friends and Family:   . Attends Religious Services:   . Active Member of Clubs or Organizations:   . Attends Archivist Meetings:   Marland Kitchen Marital Status:   Intimate Partner Violence:   . Fear of Current or Ex-Partner:   . Emotionally Abused:   Marland Kitchen Physically Abused:   . Sexually Abused:     Outpatient Medications Prior to Visit  Medication Sig Dispense Refill  . acetaminophen (TYLENOL) 500 MG tablet Take 2 tablets (1,000 mg total) by mouth  every 6 (six) hours as needed. 60 tablet 0  . amLODipine (NORVASC) 10 MG tablet Take 1 tablet (10 mg total) by mouth daily. 90 tablet 1  . canagliflozin (INVOKANA) 300 MG TABS tablet Take 1 tablet (300 mg total) by mouth daily before breakfast. 90 tablet 0  . escitalopram (LEXAPRO) 20 MG tablet Take 1 tablet (20 mg total) by mouth daily. 90 tablet 0  . hydrochlorothiazide (HYDRODIURIL) 12.5 MG tablet Take 1 tablet by mouth once daily 90 tablet 0  . ibuprofen (ADVIL) 600 MG tablet Take 1 tablet (600 mg total) by mouth every 6 (six) hours as needed for moderate pain. 60 tablet 0  . Insulin Pen Needle (BD PEN NEEDLE NANO 2ND GEN) 32G X 4 MM MISC Use four times daily to inject insulin. 200 each 11  . insulin regular human CONCENTRATED (HUMULIN R U-500 KWIKPEN) 500 UNIT/ML kwikpen 45 units before lunch, 20-25 units at lunch, 30 units before dinner, 30 units at bedtime.  Inject 30 minutes before meals 2 pen 0  . Melatonin 5 MG TABS Take 5 mg by mouth at bedtime.    . meloxicam (MOBIC) 7.5 MG tablet TAKE 1 TABLET BY MOUTH ONCE DAILY FOR 10 DAYS AND  THEN  AS  NEEDED  WITH  A  MEAL 30 tablet 0  . montelukast (SINGULAIR) 10 MG tablet Take 1 tablet (10 mg total) by mouth at bedtime. 90 tablet 1  . ondansetron (ZOFRAN) 4 MG tablet Take 1 tablet (4 mg total) by mouth every 8 (eight) hours as needed for nausea or vomiting. 20 tablet 0  . vitamin B-12 (CYANOCOBALAMIN) 1000 MCG tablet Take 1,000 mcg by mouth daily.    Marland Kitchen atorvastatin (LIPITOR) 20 MG tablet Take 1 tablet by mouth once daily 90 tablet 0  . Cholecalciferol (VITAMIN D) 50 MCG (2000 UT) CAPS Take 2,000 Units by mouth daily. (Patient not taking: Reported on 07/07/2020)    . zinc gluconate 50 MG tablet Take 50 mg by mouth daily. (Patient not taking: Reported on 07/07/2020)     No facility-administered medications prior to visit.    Allergies  Allergen Reactions  . Ozempic (0.25 Or 0.5 Mg-Dose) [Semaglutide(0.25 Or 0.5mg -Dos)] Nausea And Vomiting  .  Actos [Pioglitazone Hydrochloride] Other (See Comments)    Wt gain  . Lisinopril     Angioedema, including likely GI involvement  . Metformin And Related Diarrhea and Nausea And Vomiting    ROS Review of Systems  Constitutional: Negative.   HENT: Negative.   Eyes: Negative for photophobia and visual disturbance.  Respiratory: Negative.   Cardiovascular: Negative.   Gastrointestinal: Positive for constipation. Negative for anal bleeding and  blood in stool.  Endocrine: Negative for polyphagia and polyuria.  Genitourinary: Negative.   Musculoskeletal: Negative for gait problem.  Allergic/Immunologic: Negative for immunocompromised state.  Neurological: Negative for speech difficulty and light-headedness.  Hematological: Does not bruise/bleed easily.  Psychiatric/Behavioral: Negative.       Objective:    Physical Exam Vitals and nursing note reviewed.  Constitutional:      General: She is not in acute distress.    Appearance: Normal appearance. She is obese. She is not ill-appearing, toxic-appearing or diaphoretic.  HENT:     Head: Normocephalic and atraumatic.     Right Ear: Tympanic membrane, ear canal and external ear normal.     Left Ear: Tympanic membrane, ear canal and external ear normal.  Eyes:     General: No scleral icterus.       Right eye: No discharge.        Left eye: No discharge.     Extraocular Movements: Extraocular movements intact.     Conjunctiva/sclera: Conjunctivae normal.     Pupils: Pupils are equal, round, and reactive to light.  Cardiovascular:     Rate and Rhythm: Normal rate and regular rhythm.     Pulses:          Dorsalis pedis pulses are 2+ on the right side and 2+ on the left side.       Posterior tibial pulses are 1+ on the right side and 1+ on the left side.  Pulmonary:     Effort: Pulmonary effort is normal.     Breath sounds: Normal breath sounds.  Abdominal:     General: Bowel sounds are normal.  Musculoskeletal:     Cervical  back: No rigidity or tenderness.  Lymphadenopathy:     Cervical: No cervical adenopathy.  Skin:    General: Skin is warm and dry.  Neurological:     Mental Status: She is alert and oriented to person, place, and time.  Psychiatric:        Mood and Affect: Mood normal.        Behavior: Behavior normal.    Diabetic Foot Exam - Simple   Simple Foot Form Visual Inspection No deformities, no ulcerations, no other skin breakdown bilaterally: Yes Sensation Testing Intact to touch and monofilament testing bilaterally: Yes Pulse Check Posterior Tibialis and Dorsalis pulse intact bilaterally: Yes Comments     BP 126/72   Pulse 93   Temp (!) 96.8 F (36 C) (Tympanic)   Ht 5\' 3"  (1.6 m)   Wt 199 lb (90.3 kg)   SpO2 97%   BMI 35.25 kg/m  Wt Readings from Last 3 Encounters:  07/07/20 199 lb (90.3 kg)  02/16/20 185 lb (83.9 kg)  01/14/20 197 lb (89.4 kg)     Health Maintenance Due  Topic Date Due  . Hepatitis C Screening  Never done  . OPHTHALMOLOGY EXAM  Never done  . MAMMOGRAM  01/09/2017  . COLONOSCOPY  01/09/2017  . FOOT EXAM  08/29/2019  . INFLUENZA VACCINE  07/04/2020    There are no preventive care reminders to display for this patient.  Lab Results  Component Value Date   TSH 1.497 12/23/2018   Lab Results  Component Value Date   WBC 6.1 07/07/2020   HGB 13.1 07/07/2020   HCT 40.6 07/07/2020   MCV 81.9 07/07/2020   PLT 260.0 07/07/2020   Lab Results  Component Value Date   NA 138 07/07/2020   K 4.2 07/07/2020   CO2  30 07/07/2020   GLUCOSE 204 (H) 07/07/2020   BUN 12 07/07/2020   CREATININE 0.48 07/07/2020   BILITOT 0.4 07/07/2020   ALKPHOS 64 07/07/2020   AST 16 07/07/2020   ALT 24 07/07/2020   PROT 6.8 07/07/2020   ALBUMIN 3.9 07/07/2020   CALCIUM 9.5 07/07/2020   ANIONGAP 10 07/30/2019   GFR 135.04 07/07/2020   Lab Results  Component Value Date   CHOL 176 07/07/2020   Lab Results  Component Value Date   HDL 37.00 (L) 07/07/2020    Lab Results  Component Value Date   LDLCALC 71 07/21/2019   Lab Results  Component Value Date   TRIG 309.0 (H) 07/07/2020   Lab Results  Component Value Date   CHOLHDL 5 07/07/2020   Lab Results  Component Value Date   HGBA1C 11.2 (H) 07/07/2020      Assessment & Plan:   Problem List Items Addressed This Visit      Cardiovascular and Mediastinum   Essential hypertension   Relevant Medications   atorvastatin (LIPITOR) 40 MG tablet   Other Relevant Orders   Comprehensive metabolic panel (Completed)   Urinalysis, Routine w reflex microscopic   Microalbumin / creatinine urine ratio     Endocrine   Type 2 diabetes mellitus with complication, with long-term current use of insulin (HCC)   Relevant Medications   atorvastatin (LIPITOR) 40 MG tablet   Other Relevant Orders   Comprehensive metabolic panel (Completed)   Hemoglobin A1c (Completed)   Ambulatory referral to Ophthalmology   Urinalysis, Routine w reflex microscopic   Microalbumin / creatinine urine ratio     Other   Elevated cholesterol   Relevant Medications   atorvastatin (LIPITOR) 40 MG tablet   Other Relevant Orders   Comprehensive metabolic panel (Completed)   Lipid panel (Completed)   LDL cholesterol, direct (Completed)   Anemia - Primary   Relevant Medications   Iron, Ferrous Sulfate, 325 (65 Fe) MG TABS   Other Relevant Orders   CBC (Completed)   Vitamin B12 (Completed)   Iron deficiency   Relevant Medications   Iron, Ferrous Sulfate, 325 (65 Fe) MG TABS   Other Relevant Orders   Iron, TIBC and Ferritin Panel (Completed)      Meds ordered this encounter  Medications  . atorvastatin (LIPITOR) 40 MG tablet    Sig: Take 1 tablet (40 mg total) by mouth daily.    Dispense:  90 tablet    Refill:  3  . Iron, Ferrous Sulfate, 325 (65 Fe) MG TABS    Sig: Take one tablet daily.    Dispense:  90 tablet    Refill:  1    Follow-up: Return in about 6 months (around 01/07/2021).   Advised to  follow back up with Dr. Dwyane Dee.  Information given on exercising to lose weight and calorie counting to lose weight.  She was also given information on chronic constipation.  Advised her to use Colace with MiraLAX twice daily.  Continue all medicines as above.  May have to consider Norvasc as a contributor to her constipation. Have increased lipitor to 40mg  daily and added iron therapy as well.  Libby Maw, MD

## 2020-07-08 ENCOUNTER — Encounter: Payer: Self-pay | Admitting: Physician Assistant

## 2020-07-08 LAB — IRON,TIBC AND FERRITIN PANEL
%SAT: 16 % (calc) (ref 16–45)
Ferritin: 46 ng/mL (ref 16–232)
Iron: 54 ug/dL (ref 45–160)
TIBC: 344 mcg/dL (calc) (ref 250–450)

## 2020-07-08 MED ORDER — IRON (FERROUS SULFATE) 325 (65 FE) MG PO TABS: ORAL_TABLET | ORAL | 1 refills | Status: DC

## 2020-07-08 MED ORDER — ATORVASTATIN CALCIUM 40 MG PO TABS: 40.0000 mg | ORAL_TABLET | Freq: Every day | ORAL | 3 refills | Status: DC

## 2020-07-08 NOTE — Addendum Note (Signed)
Addended by: Jon Billings on: 07/08/2020 07:57 AM   Modules accepted: Orders

## 2020-07-08 NOTE — Addendum Note (Signed)
Addended by: Jon Billings on: 07/08/2020 08:01 AM   Modules accepted: Orders

## 2020-07-11 ENCOUNTER — Other Ambulatory Visit: Payer: Self-pay | Admitting: Endocrinology

## 2020-07-11 ENCOUNTER — Other Ambulatory Visit: Payer: Self-pay | Admitting: Family Medicine

## 2020-07-11 DIAGNOSIS — M545 Low back pain, unspecified: Secondary | ICD-10-CM

## 2020-07-13 ENCOUNTER — Telehealth: Payer: Self-pay | Admitting: Endocrinology

## 2020-07-13 ENCOUNTER — Other Ambulatory Visit: Payer: Self-pay

## 2020-07-13 MED ORDER — HUMULIN R U-500 KWIKPEN 500 UNIT/ML ~~LOC~~ SOPN: PEN_INJECTOR | SUBCUTANEOUS | 0 refills | Status: DC

## 2020-07-13 MED ORDER — CANAGLIFLOZIN 300 MG PO TABS: 300.0000 mg | ORAL_TABLET | Freq: Every day | ORAL | 0 refills | Status: DC

## 2020-07-13 NOTE — Telephone Encounter (Signed)
Medication Refill Request  Did you call your pharmacy and request this refill first? Yes . If patient has not contacted pharmacy first, instruct them to do so for future refills.  . Remind them that contacting the pharmacy for their refill is the quickest method to get the refill.  . Refill policy also stated that it will take anywhere between 24-72 hours to receive the refill.    Name of medication?  canagliflozin (INVOKANA) 300 MG TABS tablet  AND insulin regular human CONCENTRATED (HUMULIN R U-500 KWIKPEN) 500 UNIT/ML kwikpen  Is this a 90 day supply? Yes  Name and location of pharmacy?  [place], [place] - [address] Phone:  [phone]  Fax:  [phone]       . Is the request for diabetes test strips? No . If yes, what brand? N/A

## 2020-07-13 NOTE — Telephone Encounter (Signed)
Rx sent 

## 2020-07-15 ENCOUNTER — Telehealth: Payer: Self-pay | Admitting: Endocrinology

## 2020-07-15 NOTE — Telephone Encounter (Signed)
Patient called regarding a pre-authorization for Invokana.  Call back number is [phone]

## 2020-07-16 NOTE — Telephone Encounter (Signed)
She needs to find out from insurance what is covered. Also need to reschedule her missed appointment

## 2020-07-16 NOTE — Telephone Encounter (Signed)
Do you want to do PA or change medication?

## 2020-07-16 NOTE — Telephone Encounter (Signed)
Patient has appointment on 08/10/20 at 1:30, and will contact her insurance company to find out what is covered.

## 2020-07-23 ENCOUNTER — Other Ambulatory Visit: Payer: Self-pay | Admitting: Family Medicine

## 2020-07-23 ENCOUNTER — Telehealth: Payer: Self-pay | Admitting: Endocrinology

## 2020-07-23 DIAGNOSIS — E785 Hyperlipidemia, unspecified: Secondary | ICD-10-CM

## 2020-07-23 DIAGNOSIS — F329 Major depressive disorder, single episode, unspecified: Secondary | ICD-10-CM

## 2020-07-23 DIAGNOSIS — I10 Essential (primary) hypertension: Secondary | ICD-10-CM

## 2020-07-23 DIAGNOSIS — L509 Urticaria, unspecified: Secondary | ICD-10-CM

## 2020-07-23 DIAGNOSIS — F419 Anxiety disorder, unspecified: Secondary | ICD-10-CM

## 2020-07-23 DIAGNOSIS — F32A Depression, unspecified: Secondary | ICD-10-CM

## 2020-07-23 MED ORDER — DAPAGLIFLOZIN PROPANEDIOL 10 MG PO TABS
10.0000 mg | ORAL_TABLET | Freq: Every day | ORAL | 2 refills | Status: DC
Start: 2020-07-23 — End: 2020-10-07

## 2020-07-23 NOTE — Telephone Encounter (Signed)
Farxiga 10mg  qd

## 2020-07-23 NOTE — Telephone Encounter (Signed)
Patient states she needs an alternative to the invokana due to insurance - they cover farxiga as an alternative and she requests an Rx to be sent for a 90 day supply to : [place], [place] - [address] Phone:  [phone]  Fax:  [phone]

## 2020-07-23 NOTE — Addendum Note (Signed)
Addended by: Gae Dry D on: 07/23/2020 02:45 PM   Modules accepted: Orders

## 2020-07-23 NOTE — Telephone Encounter (Signed)
Please advise 

## 2020-08-05 ENCOUNTER — Other Ambulatory Visit (INDEPENDENT_AMBULATORY_CARE_PROVIDER_SITE_OTHER): Payer: 59

## 2020-08-05 ENCOUNTER — Other Ambulatory Visit: Payer: Self-pay

## 2020-08-05 DIAGNOSIS — E1165 Type 2 diabetes mellitus with hyperglycemia: Secondary | ICD-10-CM | POA: Diagnosis not present

## 2020-08-05 DIAGNOSIS — Z794 Long term (current) use of insulin: Secondary | ICD-10-CM

## 2020-08-05 DIAGNOSIS — E782 Mixed hyperlipidemia: Secondary | ICD-10-CM

## 2020-08-05 LAB — COMPREHENSIVE METABOLIC PANEL
ALT: 21 U/L (ref 0–35)
AST: 15 U/L (ref 0–37)
Albumin: 4 g/dL (ref 3.5–5.2)
Alkaline Phosphatase: 63 U/L (ref 39–117)
BUN: 16 mg/dL (ref 6–23)
CO2: 29 mEq/L (ref 19–32)
Calcium: 9.2 mg/dL (ref 8.4–10.5)
Chloride: 101 mEq/L (ref 96–112)
Creatinine, Ser: 0.54 mg/dL (ref 0.40–1.20)
GFR: 117.84 mL/min (ref >60.00)
Glucose, Bld: 228 mg/dL — ABNORMAL HIGH (ref 70–99)
Potassium: 3.9 mEq/L (ref 3.5–5.1)
Sodium: 138 mEq/L (ref 135–145)
Total Bilirubin: 0.4 mg/dL (ref 0.2–1.2)
Total Protein: 7 g/dL (ref 6.0–8.3)

## 2020-08-05 LAB — LIPID PANEL
Cholesterol: 152 mg/dL (ref 0–200)
HDL: 34.9 mg/dL — ABNORMAL LOW (ref >39.00)
NonHDL: 116.74
Total CHOL/HDL Ratio: 4
Triglycerides: 295 mg/dL — ABNORMAL HIGH (ref 0.0–149.0)
VLDL: 59 mg/dL — ABNORMAL HIGH (ref 0.0–40.0)

## 2020-08-05 LAB — HEMOGLOBIN A1C: Hgb A1c MFr Bld: 9.8 % — ABNORMAL HIGH (ref 4.6–6.5)

## 2020-08-05 LAB — LDL CHOLESTEROL, DIRECT: Direct LDL: 88 mg/dL

## 2020-08-10 ENCOUNTER — Other Ambulatory Visit: Payer: Self-pay

## 2020-08-10 ENCOUNTER — Encounter: Payer: Self-pay | Admitting: Endocrinology

## 2020-08-10 ENCOUNTER — Ambulatory Visit (INDEPENDENT_AMBULATORY_CARE_PROVIDER_SITE_OTHER): Payer: 59 | Admitting: Endocrinology

## 2020-08-10 VITALS — BP 138/78 | HR 105 | Ht 63.0 in | Wt 204.4 lb

## 2020-08-10 DIAGNOSIS — E782 Mixed hyperlipidemia: Secondary | ICD-10-CM

## 2020-08-10 DIAGNOSIS — Z794 Long term (current) use of insulin: Secondary | ICD-10-CM

## 2020-08-10 DIAGNOSIS — E1165 Type 2 diabetes mellitus with hyperglycemia: Secondary | ICD-10-CM | POA: Diagnosis not present

## 2020-08-10 MED ORDER — FREESTYLE LIBRE 2 SENSOR MISC: 2.0000 | 3 refills | Status: DC

## 2020-08-10 MED ORDER — FREESTYLE LIBRE 2 READER DEVI: 1.0000 | Freq: Once | 0 refills | Status: AC

## 2020-08-10 NOTE — Progress Notes (Signed)
Patient ID: Holly Ortiz, female   DOB: 08/11/1966, 54 y.o.   MRN: [id number]          Reason for Appointment: Follow-up for Type 2 Diabetes   History of Present Illness:          Date of diagnosis of type 2 diabetes mellitus:   At age 85      Background history:  Her diabetes was diagnosed incidentally when she was having unrelated problems She took various oral agents including metformin for several years About 5 years ago she was started on insulin and she apparently was taking only one kind of insulin possibly a premixed insulin twice a day She said that her blood sugars have been usually poorly controlled with A1c usually 8-9% She was also taking metformin, Amaryl and Januvia prior to moving here from [place]  Recent history:    INSULIN regimen is: Humulin U-500, 40 to 45 units 4 times a day   Non-insulin hypoglycemic drugs the patient is taking are: Farxiga 10 mg daily, previously on Invokana 300 mg daily  Current management, blood sugar patterns and problems identified:   Her A1c is now 9.8   She thinks that her blood sugars were much better when she was taking the Humulin R and Invokana together but not recently  [place] was started in 8/21 because of insurance not covering Invokana  She was restarted on U-500 insulin on her last visit in 5/21  As before she is checking readings with her [employer] brand meter and has not looked into a brand-name meter  Most of her fasting readings are significantly high and they are appearing slightly higher at lunchtime also, lab glucose 228 fasting  [place] is not covered  No yeast infections with Wilder Glade  She forgets to check readings after meals and has no idea what her blood sugars are after dinner  She continues to be walking her dogs regularly but does not like to do any formal exercise  She also admits that she is not able to control her diet consistently as she likes food, her blood sugar was over 300 this morning  from eating out last evening  Renal function normal with continuing [place] Glade now  Her weight has fluctuated and is most recently going up        Side effects from medications have been: Hives from [place]; abdominal swelling and weight gain with Lantus and Basaglar  Compliance with the medical regimen: Poor      Glucose monitoring:  done 1-3 times a day         Glucometer: [employer] brand       Blood Glucose readings from her meter as follows   PRE-MEAL Fasting Lunch Dinner Bedtime Overall  Glucose range: 193-306      Mean/median:        POST-MEAL PC Breakfast PC Lunch PC Dinner  Glucose range:     Mean/median:       Recent fasting range 302-367, no average available  Dietician visit, most recent: A few years ago in [place]  Weight history:  Wt Readings from Last 3 Encounters:  08/10/20 204 lb 6.4 oz (92.7 kg)  07/07/20 199 lb (90.3 kg)  02/16/20 185 lb (83.9 kg)    Glycemic control:   Lab Results  Component Value Date   HGBA1C 9.8 (H) 08/05/2020   HGBA1C 11.2 (H) 07/07/2020   HGBA1C 10.1 (H) 01/09/2020   Lab Results  Component Value Date   MICROALBUR 2.0 (H) 01/09/2020  LDLCALC 71 07/21/2019   CREATININE 0.54 08/05/2020   Lab Results  Component Value Date   MICRALBCREAT 4.3 01/09/2020    Lab Results  Component Value Date   FRUCTOSAMINE 408 (H) 04/09/2020   FRUCTOSAMINE 325 (H) 07/21/2019    Lab on 08/05/2020  Component Date Value Ref Range Status  . Cholesterol 08/05/2020 152  0 - 200 mg/dL Final   ATP III Classification       Desirable:  < 200 mg/dL               Borderline High:  200 - 239 mg/dL          High:  > = 240 mg/dL  . Triglycerides 08/05/2020 295.0* 0 - 149 mg/dL Final   Normal:  <150 mg/dLBorderline High:  150 - 199 mg/dL  . HDL 08/05/2020 34.90* >39.00 mg/dL Final  . VLDL 08/05/2020 59.0* 0.0 - 40.0 mg/dL Final  . Total CHOL/HDL Ratio 08/05/2020 4   Final                  Men          Women1/2 Average Risk     3.4           3.3Average Risk          5.0          4.42X Average Risk          9.6          7.13X Average Risk          15.0          11.0                      . NonHDL 08/05/2020 116.74   Final   NOTE:  Non-HDL goal should be 30 mg/dL higher than patient's LDL goal (i.e. LDL goal of < 70 mg/dL, would have non-HDL goal of < 100 mg/dL)  . Sodium 08/05/2020 138  135 - 145 mEq/L Final  . Potassium 08/05/2020 3.9  3.5 - 5.1 mEq/L Final  . Chloride 08/05/2020 101  96 - 112 mEq/L Final  . CO2 08/05/2020 29  19 - 32 mEq/L Final  . Glucose, Bld 08/05/2020 228* 70 - 99 mg/dL Final  . BUN 08/05/2020 16  6 - 23 mg/dL Final  . Creatinine, Ser 08/05/2020 0.54  0.40 - 1.20 mg/dL Final  . Total Bilirubin 08/05/2020 0.4  0.2 - 1.2 mg/dL Final  . Alkaline Phosphatase 08/05/2020 63  39 - 117 U/L Final  . AST 08/05/2020 15  0 - 37 U/L Final  . ALT 08/05/2020 21  0 - 35 U/L Final  . Total Protein 08/05/2020 7.0  6.0 - 8.3 g/dL Final  . Albumin 08/05/2020 4.0  3.5 - 5.2 g/dL Final  . GFR 08/05/2020 117.84  >60.00 mL/min Final  . Calcium 08/05/2020 9.2  8.4 - 10.5 mg/dL Final  . Hgb A1c MFr Bld 08/05/2020 9.8* 4.6 - 6.5 % Final   Glycemic Control Guidelines for People with Diabetes:Non Diabetic:  <6%Goal of Therapy: <7%Additional Action Suggested:  >8%   . Direct LDL 08/05/2020 88.0  mg/dL Final   Optimal:  <100 mg/dLNear or Above Optimal:  100-129 mg/dLBorderline High:  130-159 mg/dLHigh:  160-189 mg/dLVery High:  >190 mg/dL    Allergies as of 08/10/2020      Reactions   Ozempic (0.25 Or 0.5 Mg-dose) [semaglutide(0.25 Or 0.5mg -dos)] Nausea And Vomiting   Actos [pioglitazone Hydrochloride]  Other (See Comments)   Wt gain   Lisinopril    Angioedema, including likely GI involvement   Metformin And Related Diarrhea, Nausea And Vomiting      Medication List       Accurate as of August 10, 2020  1:56 PM. If you have any questions, ask your nurse or doctor.        STOP taking these medications   canagliflozin 300 MG  Tabs tablet Commonly known as: Invokana Stopped by: Elayne Snare, MD     TAKE these medications   acetaminophen 500 MG tablet Commonly known as: TYLENOL Take 2 tablets (1,000 mg total) by mouth every 6 (six) hours as needed.   amLODipine 10 MG tablet Commonly known as: NORVASC Take 1 tablet by mouth once daily   atorvastatin 40 MG tablet Commonly known as: LIPITOR Take 1 tablet (40 mg total) by mouth daily.   atorvastatin 20 MG tablet Commonly known as: LIPITOR Take 1 tablet by mouth once daily   BD Pen Needle Nano 2nd Gen 32G X 4 MM Misc Generic drug: Insulin Pen Needle Use four times daily to inject insulin.   dapagliflozin propanediol 10 MG Tabs tablet Commonly known as: Farxiga Take 1 tablet (10 mg total) by mouth daily.   escitalopram 20 MG tablet Commonly known as: LEXAPRO Take 1 tablet by mouth once daily   FreeStyle Libre 2 Reader Devi 1 Device by Does not apply route once for 1 dose. Started by: Elayne Snare, MD   FreeStyle Libre 2 Sensor Misc 2 Devices by Does not apply route every 14 (fourteen) days. Started by: Elayne Snare, MD   HumuLIN R U-500 KwikPen 500 UNIT/ML kwikpen Generic drug: insulin regular human CONCENTRATED Inject 45 units under the skin before lunch, 20-25 at supper and 30 at bedtime. What changed: additional instructions   hydrochlorothiazide 12.5 MG tablet Commonly known as: HYDRODIURIL Take 1 tablet by mouth once daily   ibuprofen 600 MG tablet Commonly known as: ADVIL Take 1 tablet (600 mg total) by mouth every 6 (six) hours as needed for moderate pain.   Iron (Ferrous Sulfate) 325 (65 Fe) MG Tabs Take one tablet daily.   melatonin 5 MG Tabs Take 5 mg by mouth at bedtime.   meloxicam 7.5 MG tablet Commonly known as: MOBIC TAKE 1 TABLET BY MOUTH ONCE DAILY FOR 10 DAYS AND  THEN  AS  NEEDED  WITH  A  MEAL   montelukast 10 MG tablet Commonly known as: SINGULAIR TAKE 1 TABLET BY MOUTH AT BEDTIME   ondansetron 4 MG  tablet Commonly known as: Zofran Take 1 tablet (4 mg total) by mouth every 8 (eight) hours as needed for nausea or vomiting.   vitamin B-12 1000 MCG tablet Commonly known as: CYANOCOBALAMIN Take 1,000 mcg by mouth daily.   Vitamin D 50 MCG (2000 UT) Caps Take 2,000 Units by mouth daily.   zinc gluconate 50 MG tablet Take 50 mg by mouth daily.       Allergies:  Allergies  Allergen Reactions  . Ozempic (0.25 Or 0.5 Mg-Dose) [Semaglutide(0.25 Or 0.5mg -Dos)] Nausea And Vomiting  . Actos [Pioglitazone Hydrochloride] Other (See Comments)    Wt gain  . Lisinopril     Angioedema, including likely GI involvement  . Metformin And Related Diarrhea and Nausea And Vomiting    Past Medical History:  Diagnosis Date  . Allergy   . 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-I)   . Anxiety   . Asthma   .  Diabetes mellitus    type II  . Hyperlipidemia   . Hypertension   . Urticaria, chronic   . UTI (lower urinary tract infection)     Past Surgical History:  Procedure Laterality Date  . ABDOMINAL HYSTERECTOMY  08/07/2019  . breast lift  1993  . CATARACT EXTRACTION, BILATERAL Bilateral 2014  . LAPAROSCOPIC VAGINAL HYSTERECTOMY WITH SALPINGO OOPHORECTOMY Bilateral 08/07/2019   Procedure: LAPAROSCOPIC ASSISTED VAGINAL HYSTERECTOMY WITH SALPINGO OOPHORECTOMY, possible abdominal hysterectomy;  Surgeon: Everlene Farrier, MD;  Location: [facility];  Service: Gynecology;  Laterality: Bilateral;  possible abdominal hysterectomy pt is diabetic and hypertensive  . SOFT TISSUE MASS EXCISION Right 2016   [place] area  . TUBAL LIGATION  1995  . tummy tuck  1993    Family History  Problem Relation Age of Onset  . Hyperlipidemia Other   . Heart disease Other   . Diabetes Father   . Hypertension Father   . COPD Father        Cause of death  . Colon cancer Mother   . Hypertension Mother   . Hypertension Sister   . Diabetes Sister   . Hypertension Brother   . Cancer Sister         Brain glioblastoma  . Diabetes Sister     Social History:  reports that she has never smoked. She has never used smokeless tobacco. She reports that she does not drink alcohol and does not use drugs.   Review of Systems   Lipid history: On Lipitor 20 mg from her PCP   She last has LDL of 71    Lab Results  Component Value Date   CHOL 152 08/05/2020   HDL 34.90 (L) 08/05/2020   LDLCALC 71 07/21/2019   LDLDIRECT 88.0 08/05/2020   TRIG 295.0 (H) 08/05/2020   CHOLHDL 4 08/05/2020           Hypertension: Has been present since age 45, on amlodipine and HCTZ 12.5mg  prescribed by her PCP  May have had angioedema with lisinopril   BP Readings from Last 3 Encounters:  08/10/20 138/78  07/07/20 126/72  01/14/20 (!) 150/70    Most recent eye exam was in 2017  Most recent foot exam: 9/19  Currently known complications of diabetes: None  LABS:  Lab on 08/05/2020  Component Date Value Ref Range Status  . Cholesterol 08/05/2020 152  0 - 200 mg/dL Final   ATP III Classification       Desirable:  < 200 mg/dL               Borderline High:  200 - 239 mg/dL          High:  > = 240 mg/dL  . Triglycerides 08/05/2020 295.0* 0 - 149 mg/dL Final   Normal:  <150 mg/dLBorderline High:  150 - 199 mg/dL  . HDL 08/05/2020 34.90* >39.00 mg/dL Final  . VLDL 08/05/2020 59.0* 0.0 - 40.0 mg/dL Final  . Total CHOL/HDL Ratio 08/05/2020 4   Final                  Men          Women1/2 Average Risk     3.4          3.3Average Risk          5.0          4.42X Average Risk          9.6  7.13X Average Risk          15.0          11.0                      . NonHDL 08/05/2020 116.74   Final   NOTE:  Non-HDL goal should be 30 mg/dL higher than patient's LDL goal (i.e. LDL goal of < 70 mg/dL, would have non-HDL goal of < 100 mg/dL)  . Sodium 08/05/2020 138  135 - 145 mEq/L Final  . Potassium 08/05/2020 3.9  3.5 - 5.1 mEq/L Final  . Chloride 08/05/2020 101  96 - 112 mEq/L Final  . CO2  08/05/2020 29  19 - 32 mEq/L Final  . Glucose, Bld 08/05/2020 228* 70 - 99 mg/dL Final  . BUN 08/05/2020 16  6 - 23 mg/dL Final  . Creatinine, Ser 08/05/2020 0.54  0.40 - 1.20 mg/dL Final  . Total Bilirubin 08/05/2020 0.4  0.2 - 1.2 mg/dL Final  . Alkaline Phosphatase 08/05/2020 63  39 - 117 U/L Final  . AST 08/05/2020 15  0 - 37 U/L Final  . ALT 08/05/2020 21  0 - 35 U/L Final  . Total Protein 08/05/2020 7.0  6.0 - 8.3 g/dL Final  . Albumin 08/05/2020 4.0  3.5 - 5.2 g/dL Final  . GFR 08/05/2020 117.84  >60.00 mL/min Final  . Calcium 08/05/2020 9.2  8.4 - 10.5 mg/dL Final  . Hgb A1c MFr Bld 08/05/2020 9.8* 4.6 - 6.5 % Final   Glycemic Control Guidelines for People with Diabetes:Non Diabetic:  <6%Goal of Therapy: <7%Additional Action Suggested:  >8%   . Direct LDL 08/05/2020 88.0  mg/dL Final   Optimal:  <100 mg/dLNear or Above Optimal:  100-129 mg/dLBorderline High:  130-159 mg/dLHigh:  160-189 mg/dLVery High:  >190 mg/dL    Physical Examination:  BP 138/78 (BP Location: Left Arm, Patient Position: Sitting, Cuff Size: Large)   Pulse (!) 105   Ht 5\' 3"  (1.6 m)   Wt 204 lb 6.4 oz (92.7 kg)   SpO2 95%   BMI 36.21 kg/m       ASSESSMENT:  Diabetes type 2, with history of persistent hyperglycemia, insulin-dependent  See history of present illness for detailed discussion of current diabetes management, blood sugar patterns and problems identified  A1c is still significantly high at 9.8  She is insulin resistant  She did not follow-up as directed and is now coming back 4 months later   Although she thinks her blood sugars were better with Invokana her A1c last month was even higher and now she is on [place] Without adequate glucose monitoring difficult to know what her blood sugar patterns are Fasting readings are consistently high Currently on a total of about 180 units of the U-500 insulin with the KwikPen  She also did not like to try Metformin even though it is unclear  whether she had any GI side effects with this   HYPERLIPIDEMIA: Has high triglycerides likely from poor diabetes control and weight gain  PLAN:   Check blood sugars after meals consistently She will see if she can get coverage for the freestyle libre and prescription was sent Explained to her how this works and the need for using this at least 3 times a day to keep data in the sensor She needs to keep her postprandial readings at least under 180 Given her titration sheet to adjust her bedtime U-500 insulin by 5 units every 3 days until  morning sugars are below 130 Increase exercise Consistent diet Showed her the sample of the OmniPod insulin pump and discussed how this works but she is very reluctant to consider a pump at this time If blood sugars are continuing to be difficult to control may consider Victoza which can the adjusted more gradually  Follow-up in 2 months   Patient Instructions  Insulin 50-55 in am  50 at bedtime and adjust nite dose based on am sugar  Check blood sugars on waking up 7 days a week  Also check blood sugars about 2 hours after meals and do this after different meals by rotation  Recommended blood sugar levels on waking up are 90-130 and about 2 hours after meal is 130-180  Please bring your blood sugar monitor to each visit, thank you       Elayne Snare 08/10/2020, 1:56 PM   Note: This office note was prepared with Dragon voice recognition system technology. Any transcriptional errors that result from this process are unintentional.

## 2020-08-10 NOTE — Patient Instructions (Addendum)
Insulin 50-55 in am  50 at bedtime and adjust nite dose based on am sugar  Check blood sugars on waking up 7 days a week  Also check blood sugars about 2 hours after meals and do this after different meals by rotation  Recommended blood sugar levels on waking up are 90-130 and about 2 hours after meal is 130-180  Please bring your blood sugar monitor to each visit, thank you

## 2020-08-23 ENCOUNTER — Ambulatory Visit: Payer: 59 | Admitting: Physician Assistant

## 2020-08-24 ENCOUNTER — Telehealth: Payer: Self-pay

## 2020-08-24 NOTE — Telephone Encounter (Signed)
New message    Prior authorization fax on [phone] [place]- [place]

## 2020-08-26 NOTE — Telephone Encounter (Signed)
F/u    Checking on the status of prior authorization asking for a call back today

## 2020-08-27 NOTE — Telephone Encounter (Signed)
Left a vm for patient to callback. We have received the authorization for the Omnipod!! It will be placed in the providers box to fill out. Please allow 72 hours for this to be completed

## 2020-08-30 DIAGNOSIS — Z0279 Encounter for issue of other medical certificate: Secondary | ICD-10-CM

## 2020-09-30 ENCOUNTER — Other Ambulatory Visit (INDEPENDENT_AMBULATORY_CARE_PROVIDER_SITE_OTHER): Payer: 59

## 2020-09-30 ENCOUNTER — Other Ambulatory Visit: Payer: Self-pay

## 2020-09-30 DIAGNOSIS — E1165 Type 2 diabetes mellitus with hyperglycemia: Secondary | ICD-10-CM | POA: Diagnosis not present

## 2020-09-30 DIAGNOSIS — Z794 Long term (current) use of insulin: Secondary | ICD-10-CM | POA: Diagnosis not present

## 2020-09-30 LAB — BASIC METABOLIC PANEL
BUN: 12 mg/dL (ref 6–23)
CO2: 30 mEq/L (ref 19–32)
Calcium: 9.3 mg/dL (ref 8.4–10.5)
Chloride: 103 mEq/L (ref 96–112)
Creatinine, Ser: 0.47 mg/dL (ref 0.40–1.20)
GFR: 108.46 mL/min (ref >60.00)
Glucose, Bld: 169 mg/dL — ABNORMAL HIGH (ref 70–99)
Potassium: 3.8 mEq/L (ref 3.5–5.1)
Sodium: 141 mEq/L (ref 135–145)

## 2020-10-01 LAB — FRUCTOSAMINE: Fructosamine: 314 umol/L — ABNORMAL HIGH (ref 0–285)

## 2020-10-04 NOTE — Progress Notes (Signed)
Patient ID: Holly Ortiz, female   DOB: 20-Mar-1966, 54 y.o.   MRN: [id number]          Reason for Appointment: Follow-up for Type 2 Diabetes   History of Present Illness:          Date of diagnosis of type 2 diabetes mellitus:   At age 13      Background history:  Her diabetes was diagnosed incidentally when she was having unrelated problems She took various oral agents including metformin for several years About 5 years ago she was started on insulin and she apparently was taking only one kind of insulin possibly a premixed insulin twice a day She said that her blood sugars have been usually poorly controlled with A1c usually 8-9% She was also taking metformin, Amaryl and Januvia prior to moving here from [place]  Recent history:    INSULIN regimen is: Humulin U-500, 55 units 4 times a day   Non-insulin hypoglycemic drugs the patient is taking are: Farxiga 10 mg daily   Current management, blood sugar patterns and problems identified:   Her A1c is last 9.8 fructosamine is now 315, improved   She did not bring her monitor today for review  Also she thinks that her insurance will not cover any test strips and will not use a brand-name meter; she did not get the freestyle libre because of high out-of-pocket expense also  She was told to increase her bedtime insulin to get her morning sugars done which were previously as high as 306  Her fasting readings are generally fairly good with some variability  However she thinks her bedtime readings are around 190-200, these are 2 to 3 hours after dinner  No hypoglycemia also  [place] has been well-tolerated  She is trying to get some walking with her dogs up to twice a day again; does not like to do any formal exercise  She does not want to see the dietitian despite having difficulty with weight loss and not always control her diet  Her weight is up 2 pounds  Dinner at 5 pm         Side effects from medications have  been: Vomiting from Ozempic, hives from [place]; abdominal swelling/weight gain with Lantus and Basaglar   Glucose monitoring:  done 1-2 times a day         Glucometer: [employer] brand       Blood Glucose readings from her meter recall:    PRE-MEAL Fasting Lunch Dinner Bedtime Overall  Glucose range: 98-170   190-200   Mean/median:        Previous fasting: 193-306  Dietician visit, most recent: A few years ago in [place]  Weight history:  Wt Readings from Last 3 Encounters:  10/05/20 206 lb 6.4 oz (93.6 kg)  08/10/20 204 lb 6.4 oz (92.7 kg)  07/07/20 199 lb (90.3 kg)    Glycemic control:   Lab Results  Component Value Date   HGBA1C 9.8 (H) 08/05/2020   HGBA1C 11.2 (H) 07/07/2020   HGBA1C 10.1 (H) 01/09/2020   Lab Results  Component Value Date   MICROALBUR 2.0 (H) 01/09/2020   LDLCALC 71 07/21/2019   CREATININE 0.47 09/30/2020   Lab Results  Component Value Date   MICRALBCREAT 4.3 01/09/2020    Lab Results  Component Value Date   FRUCTOSAMINE 314 (H) 09/30/2020   FRUCTOSAMINE 408 (H) 04/09/2020   FRUCTOSAMINE 325 (H) 07/21/2019    Lab on 09/30/2020  Component Date Value  Ref Range Status  . Fructosamine 09/30/2020 314* 0 - 285 umol/L Final   Comment: Published reference interval for apparently healthy subjects between age 81 and 19 is 81 - 285 umol/L and in a poorly controlled diabetic population is 228 - 563 umol/L with a mean of 396 umol/L.   Marland Kitchen Sodium 09/30/2020 141  135 - 145 mEq/L Final  . Potassium 09/30/2020 3.8  3.5 - 5.1 mEq/L Final  . Chloride 09/30/2020 103  96 - 112 mEq/L Final  . CO2 09/30/2020 30  19 - 32 mEq/L Final  . Glucose, Bld 09/30/2020 169* 70 - 99 mg/dL Final  . BUN 09/30/2020 12  6 - 23 mg/dL Final  . Creatinine, Ser 09/30/2020 0.47  0.40 - 1.20 mg/dL Final  . GFR 09/30/2020 108.46  >60.00 mL/min Final   Calculated using the CKD-EPI Creatinine Equation (2021)  . Calcium 09/30/2020 9.3  8.4 - 10.5 mg/dL Final    Allergies as  of 10/05/2020      Reactions   Ozempic (0.25 Or 0.5 Mg-dose) [semaglutide(0.25 Or 0.5mg -dos)] Nausea And Vomiting   Actos [pioglitazone Hydrochloride] Other (See Comments)   Wt gain   Lisinopril    Angioedema, including likely GI involvement   Metformin And Related Diarrhea, Nausea And Vomiting      Medication List       Accurate as of October 05, 2020  9:00 AM. If you have any questions, ask your nurse or doctor.        acetaminophen 500 MG tablet Commonly known as: TYLENOL Take 2 tablets (1,000 mg total) by mouth every 6 (six) hours as needed.   amLODipine 10 MG tablet Commonly known as: NORVASC Take 1 tablet by mouth once daily   atorvastatin 40 MG tablet Commonly known as: LIPITOR Take 1 tablet (40 mg total) by mouth daily.   atorvastatin 20 MG tablet Commonly known as: LIPITOR Take 1 tablet by mouth once daily   BD Pen Needle Nano 2nd Gen 32G X 4 MM Misc Generic drug: Insulin Pen Needle Use four times daily to inject insulin.   dapagliflozin propanediol 10 MG Tabs tablet Commonly known as: Farxiga Take 1 tablet (10 mg total) by mouth daily.   escitalopram 20 MG tablet Commonly known as: LEXAPRO Take 1 tablet by mouth once daily   FreeStyle Libre 2 Sensor Misc 2 Devices by Does not apply route every 14 (fourteen) days.   HumuLIN R U-500 KwikPen 500 UNIT/ML kwikpen Generic drug: insulin regular human CONCENTRATED Inject 45 units under the skin before lunch, 20-25 at supper and 30 at bedtime. What changed:   how much to take  when to take this  additional instructions   hydrochlorothiazide 12.5 MG tablet Commonly known as: HYDRODIURIL Take 1 tablet by mouth once daily   ibuprofen 600 MG tablet Commonly known as: ADVIL Take 1 tablet (600 mg total) by mouth every 6 (six) hours as needed for moderate pain.   Iron (Ferrous Sulfate) 325 (65 Fe) MG Tabs Take one tablet daily.   melatonin 5 MG Tabs Take 5 mg by mouth at bedtime.   meloxicam 7.5 MG  tablet Commonly known as: MOBIC TAKE 1 TABLET BY MOUTH ONCE DAILY FOR 10 DAYS AND  THEN  AS  NEEDED  WITH  A  MEAL   montelukast 10 MG tablet Commonly known as: SINGULAIR TAKE 1 TABLET BY MOUTH AT BEDTIME   ondansetron 4 MG tablet Commonly known as: Zofran Take 1 tablet (4 mg total) by mouth every  8 (eight) hours as needed for nausea or vomiting.   vitamin B-12 1000 MCG tablet Commonly known as: CYANOCOBALAMIN Take 1,000 mcg by mouth daily.   Vitamin D 50 MCG (2000 UT) Caps Take 2,000 Units by mouth daily.   zinc gluconate 50 MG tablet Take 50 mg by mouth daily.       Allergies:  Allergies  Allergen Reactions  . Ozempic (0.25 Or 0.5 Mg-Dose) [Semaglutide(0.25 Or 0.5mg -Dos)] Nausea And Vomiting  . Actos [Pioglitazone Hydrochloride] Other (See Comments)    Wt gain  . Lisinopril     Angioedema, including likely GI involvement  . Metformin And Related Diarrhea and Nausea And Vomiting    Past Medical History:  Diagnosis Date  . Allergy   . 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-I)   . Anxiety   . Asthma   . Diabetes mellitus    type II  . Hyperlipidemia   . Hypertension   . Urticaria, chronic   . UTI (lower urinary tract infection)     Past Surgical History:  Procedure Laterality Date  . ABDOMINAL HYSTERECTOMY  08/07/2019  . breast lift  1993  . CATARACT EXTRACTION, BILATERAL Bilateral 2014  . LAPAROSCOPIC VAGINAL HYSTERECTOMY WITH SALPINGO OOPHORECTOMY Bilateral 08/07/2019   Procedure: LAPAROSCOPIC ASSISTED VAGINAL HYSTERECTOMY WITH SALPINGO OOPHORECTOMY, possible abdominal hysterectomy;  Surgeon: Everlene Farrier, MD;  Location: [facility];  Service: Gynecology;  Laterality: Bilateral;  possible abdominal hysterectomy pt is diabetic and hypertensive  . SOFT TISSUE MASS EXCISION Right 2016   [place] area  . TUBAL LIGATION  1995  . tummy tuck  1993    Family History  Problem Relation Age of Onset  . Hyperlipidemia Other   . Heart disease  Other   . Diabetes Father   . Hypertension Father   . COPD Father        Cause of death  . Colon cancer Mother   . Hypertension Mother   . Hypertension Sister   . Diabetes Sister   . Hypertension Brother   . Cancer Sister        Brain glioblastoma  . Diabetes Sister     Social History:  reports that she has never smoked. She has never used smokeless tobacco. She reports that she does not drink alcohol and does not use drugs.   Review of Systems   Lipid history: On Lipitor 20 mg from her PCP   She last has LDL of 71    Lab Results  Component Value Date   CHOL 152 08/05/2020   HDL 34.90 (L) 08/05/2020   LDLCALC 71 07/21/2019   LDLDIRECT 88.0 08/05/2020   TRIG 295.0 (H) 08/05/2020   CHOLHDL 4 08/05/2020           Hypertension: Has been present since age 27, on amlodipine and HCTZ 12.5mg  prescribed by her PCP  May have had angioedema with lisinopril   BP Readings from Last 3 Encounters:  10/05/20 124/82  08/10/20 138/78  07/07/20 126/72    Most recent eye exam was in 2017  Most recent foot exam: 9/19  Currently known complications of diabetes: None  LABS:  Lab on 09/30/2020  Component Date Value Ref Range Status  . Fructosamine 09/30/2020 314* 0 - 285 umol/L Final   Comment: Published reference interval for apparently healthy subjects between age 64 and 46 is 77 - 285 umol/L and in a poorly controlled diabetic population is 228 - 563 umol/L with a mean of 396 umol/L.   Marland Kitchen  Sodium 09/30/2020 141  135 - 145 mEq/L Final  . Potassium 09/30/2020 3.8  3.5 - 5.1 mEq/L Final  . Chloride 09/30/2020 103  96 - 112 mEq/L Final  . CO2 09/30/2020 30  19 - 32 mEq/L Final  . Glucose, Bld 09/30/2020 169* 70 - 99 mg/dL Final  . BUN 09/30/2020 12  6 - 23 mg/dL Final  . Creatinine, Ser 09/30/2020 0.47  0.40 - 1.20 mg/dL Final  . GFR 09/30/2020 108.46  >60.00 mL/min Final   Calculated using the CKD-EPI Creatinine Equation (2021)  . Calcium 09/30/2020 9.3  8.4 - 10.5 mg/dL  Final    Physical Examination:  BP 124/82   Pulse 99   Ht 5\' 3"  (1.6 m)   Wt 206 lb 6.4 oz (93.6 kg)   LMP 05/13/2019 Comment: patient had not had her period in a year and it came back in May and June 2020  SpO2 99%   BMI 36.56 kg/m       ASSESSMENT:  Diabetes type 2, with history of persistent hyperglycemia, insulin-dependent  See history of present illness for detailed discussion of current diabetes management, blood sugar patterns and problems identified  A1c is most recently significantly high at 9.8  She is insulin resistant  She is doing better with U-500 insulin along with [place] Especially with increasing her dose and taking 220 units on the insulin pen per day her blood sugars are appearing better She does not keep a record of her blood sugars and will not be able to afford any brand-name blood sugar meter is on the freestyle libre Also reluctant to use an insulin pump for added expense  Fructosamine is 315  Hypertension: Blood pressure is generally well controlled but she can do better with monitoring at home periodically also  PLAN:   Check blood sugars by rotation after lunch or before dinner also Brisk walking instead of just doing it slowly She needs to continue to focus on her diet and again discussed importance of nutrition counseling She can take 60 units of insulin at dinnertime and reduce bedtime dose by 5 units  Follow-up in 2 months and check A1c   Patient Instructions  Take 60 U at dinner and 50 at bedtime  Check Bp weekly  Check blood sugars on waking up 3-4  days a week  Also check blood sugars about 2 hours after meals and do this after different meals by rotation  Recommended blood sugar levels on waking up are 90-130 and about 2 hours after meal is 130-160  Please bring your blood sugar monitor to each visit, thank you  Brisk walking  Please have eye exam report sent       Elayne Snare 10/05/2020, 9:00 AM   Note: This  office note was prepared with Dragon voice recognition system technology. Any transcriptional errors that result from this process are unintentional.

## 2020-10-05 ENCOUNTER — Other Ambulatory Visit: Payer: Self-pay

## 2020-10-05 ENCOUNTER — Encounter: Payer: Self-pay | Admitting: Endocrinology

## 2020-10-05 ENCOUNTER — Ambulatory Visit: Payer: 59 | Admitting: Endocrinology

## 2020-10-05 VITALS — BP 124/82 | HR 99 | Ht 63.0 in | Wt 206.4 lb

## 2020-10-05 DIAGNOSIS — E782 Mixed hyperlipidemia: Secondary | ICD-10-CM

## 2020-10-05 DIAGNOSIS — E1165 Type 2 diabetes mellitus with hyperglycemia: Secondary | ICD-10-CM

## 2020-10-05 DIAGNOSIS — Z23 Encounter for immunization: Secondary | ICD-10-CM | POA: Diagnosis not present

## 2020-10-05 DIAGNOSIS — I10 Essential (primary) hypertension: Secondary | ICD-10-CM | POA: Diagnosis not present

## 2020-10-05 DIAGNOSIS — Z794 Long term (current) use of insulin: Secondary | ICD-10-CM

## 2020-10-05 NOTE — Patient Instructions (Addendum)
Take 60 U at dinner and 50 at bedtime  Check Bp weekly  Check blood sugars on waking up 3-4  days a week  Also check blood sugars about 2 hours after meals and do this after different meals by rotation  Recommended blood sugar levels on waking up are 90-130 and about 2 hours after meal is 130-160  Please bring your blood sugar monitor to each visit, thank you  Brisk walking  Please have eye exam report sent

## 2020-10-06 ENCOUNTER — Other Ambulatory Visit: Payer: Self-pay | Admitting: Endocrinology

## 2020-10-06 ENCOUNTER — Other Ambulatory Visit: Payer: Self-pay | Admitting: Family Medicine

## 2020-10-06 DIAGNOSIS — M545 Low back pain, unspecified: Secondary | ICD-10-CM

## 2020-10-06 DIAGNOSIS — F419 Anxiety disorder, unspecified: Secondary | ICD-10-CM

## 2020-10-06 DIAGNOSIS — L509 Urticaria, unspecified: Secondary | ICD-10-CM

## 2020-10-06 DIAGNOSIS — I10 Essential (primary) hypertension: Secondary | ICD-10-CM

## 2020-10-06 DIAGNOSIS — F32A Depression, unspecified: Secondary | ICD-10-CM

## 2020-10-26 ENCOUNTER — Other Ambulatory Visit: Payer: Self-pay

## 2020-10-27 ENCOUNTER — Ambulatory Visit: Payer: 59 | Admitting: Family

## 2020-10-27 DIAGNOSIS — Z0289 Encounter for other administrative examinations: Secondary | ICD-10-CM

## 2020-10-29 NOTE — Telephone Encounter (Signed)
Called patient to schedule appointment for follow up on medications, no answer LM to call back and schedule appointment.

## 2020-11-30 ENCOUNTER — Other Ambulatory Visit: Payer: 59

## 2020-12-02 ENCOUNTER — Ambulatory Visit: Payer: 59 | Admitting: Endocrinology

## 2020-12-04 DIAGNOSIS — M329 Systemic lupus erythematosus, unspecified: Secondary | ICD-10-CM

## 2020-12-04 DIAGNOSIS — IMO0002 Reserved for concepts with insufficient information to code with codable children: Secondary | ICD-10-CM

## 2020-12-04 HISTORY — DX: Systemic lupus erythematosus, unspecified: M32.9

## 2020-12-04 HISTORY — DX: Reserved for concepts with insufficient information to code with codable children: IMO0002

## 2020-12-06 ENCOUNTER — Telehealth: Payer: Self-pay | Admitting: Family Medicine

## 2020-12-06 ENCOUNTER — Encounter: Payer: Self-pay | Admitting: Family Medicine

## 2020-12-06 NOTE — Telephone Encounter (Signed)
Pt was no show for appt 10/27/20 acute visit. 1st no show occurrence. Fee waived. Letter mailed.

## 2020-12-26 ENCOUNTER — Other Ambulatory Visit: Payer: Self-pay | Admitting: Endocrinology

## 2021-01-12 ENCOUNTER — Ambulatory Visit: Payer: 59 | Admitting: Nurse Practitioner

## 2021-01-12 ENCOUNTER — Encounter: Payer: Self-pay | Admitting: Nurse Practitioner

## 2021-01-12 ENCOUNTER — Other Ambulatory Visit: Payer: Self-pay

## 2021-01-12 VITALS — BP 134/70 | HR 96 | Temp 97.3°F | Ht 62.0 in | Wt 211.6 lb

## 2021-01-12 DIAGNOSIS — R5382 Chronic fatigue, unspecified: Secondary | ICD-10-CM | POA: Diagnosis not present

## 2021-01-12 DIAGNOSIS — M791 Myalgia, unspecified site: Secondary | ICD-10-CM | POA: Diagnosis not present

## 2021-01-12 DIAGNOSIS — R768 Other specified abnormal immunological findings in serum: Secondary | ICD-10-CM | POA: Diagnosis not present

## 2021-01-12 LAB — CBC WITH DIFFERENTIAL/PLATELET
Basophils Absolute: 0 10*3/uL (ref 0.0–0.1)
Basophils Relative: 0.4 % (ref 0.0–3.0)
Eosinophils Absolute: 0.1 10*3/uL (ref 0.0–0.7)
Eosinophils Relative: 1.9 % (ref 0.0–5.0)
HCT: 39.9 % (ref 36.0–46.0)
Hemoglobin: 13.1 g/dL (ref 12.0–15.0)
Lymphocytes Relative: 32.4 % (ref 12.0–46.0)
Lymphs Abs: 2.5 10*3/uL (ref 0.7–4.0)
MCHC: 32.7 g/dL (ref 30.0–36.0)
MCV: 80.9 fl (ref 78.0–100.0)
Monocytes Absolute: 0.6 10*3/uL (ref 0.1–1.0)
Monocytes Relative: 8.2 % (ref 3.0–12.0)
Neutro Abs: 4.4 10*3/uL (ref 1.4–7.7)
Neutrophils Relative %: 57.1 % (ref 43.0–77.0)
Platelets: 335 10*3/uL (ref 150.0–400.0)
RBC: 4.93 Mil/uL (ref 3.87–5.11)
RDW: 15.5 % (ref 11.5–15.5)
WBC: 7.8 10*3/uL (ref 4.0–10.5)

## 2021-01-12 LAB — CK: Total CK: 53 U/L (ref 7–177)

## 2021-01-12 LAB — C-REACTIVE PROTEIN: CRP: 1.8 mg/dL (ref 0.5–20.0)

## 2021-01-12 LAB — TSH: TSH: 1.75 u[IU]/mL (ref 0.35–4.50)

## 2021-01-12 NOTE — Progress Notes (Signed)
Subjective:  Patient ID: Holly Ortiz, female    DOB: [birth year]  Age: 55 y.o. MRN: [id number]  CC: Acute Visit (Pt c/o left arm, leg, and back pain x 4 months. Pt states pain comes and goes but normally by the end of her typical day her left leg is painful and swollen. )  HPI  Holly Ortiz present with generalized muscle pain, ongoing since 2017 per patient, waxing and waning, associated with fatigue. She is not sure what make symptoms worse. Has some improvement with advil 400-600mg  daily.  States meloxicam caused hair loss. Denies any joint stiffness or swelling or fever or weight loss. Hx of uncontrolled DM with hgbA1c at 9.8. managed by Dr. Dwyane Dee. Hx of anxiety and depression, current use of lexapro. No FHx of autoimmune or neuromuscular disorder. Wt Readings from Last 3 Encounters:  01/12/21 211 lb 9.6 oz (96 kg)  10/05/20 206 lb 6.4 oz (93.6 kg)  08/10/20 204 lb 6.4 oz (92.7 kg)   BP Readings from Last 3 Encounters:  01/12/21 134/70  10/05/20 124/82  08/10/20 138/78   Reviewed past Medical, Social and Family history today.  Outpatient Medications Prior to Visit  Medication Sig Dispense Refill  . acetaminophen (TYLENOL) 500 MG tablet Take 2 tablets (1,000 mg total) by mouth every 6 (six) hours as needed. 60 tablet 0  . amLODipine (NORVASC) 10 MG tablet Take 1 tablet by mouth once daily 90 tablet 0  . atorvastatin (LIPITOR) 20 MG tablet Take 1 tablet by mouth once daily 90 tablet 0  . atorvastatin (LIPITOR) 40 MG tablet Take 1 tablet (40 mg total) by mouth daily. 90 tablet 3  . Cholecalciferol (VITAMIN D) 50 MCG (2000 UT) CAPS Take 2,000 Units by mouth daily.     . Continuous Blood Gluc Sensor (FREESTYLE LIBRE 2 SENSOR) MISC 2 Devices by Does not apply route every 14 (fourteen) days. 2 each 3  . escitalopram (LEXAPRO) 20 MG tablet Take 1 tablet by mouth once daily 90 tablet 0  . FARXIGA 10 MG TABS tablet Take 1 tablet by mouth once daily 90 tablet 0  . hydrochlorothiazide  (HYDRODIURIL) 12.5 MG tablet Take 1 tablet by mouth once daily 90 tablet 0  . Insulin Pen Needle (BD PEN NEEDLE NANO 2ND GEN) 32G X 4 MM MISC Use four times daily to inject insulin. 200 each 11  . insulin regular human CONCENTRATED (HUMULIN R U-500 KWIKPEN) 500 UNIT/ML kwikpen INJECT 45 UNITS UNDER THE SKIN BEFORE LUNCH, 20-25 UNITS AT SUPPER, 30 UNITS AT BEDTIME. Call to schedule follow up 6 mL 0  . Iron, Ferrous Sulfate, 325 (65 Fe) MG TABS Take one tablet daily. 90 tablet 1  . Melatonin 5 MG TABS Take 5 mg by mouth at bedtime.    . montelukast (SINGULAIR) 10 MG tablet TAKE 1 TABLET BY MOUTH AT BEDTIME 90 tablet 0  . ondansetron (ZOFRAN) 4 MG tablet Take 1 tablet (4 mg total) by mouth every 8 (eight) hours as needed for nausea or vomiting. 20 tablet 0  . vitamin B-12 (CYANOCOBALAMIN) 1000 MCG tablet Take 1,000 mcg by mouth daily.    Marland Kitchen zinc gluconate 50 MG tablet Take 50 mg by mouth daily.     . meloxicam (MOBIC) 7.5 MG tablet TAKE 1 TABLET BY MOUTH ONCE DAILY FOR 10 DAYS AND  THEN  AS  NEEDED  WITH  A  MEAL 30 tablet 0   No facility-administered medications prior to visit.    ROS See HPI  Objective:  BP 134/70 (BP Location: Right Arm, Patient Position: Sitting, Cuff Size: Normal)   Pulse 96   Temp (!) 97.3 F (36.3 C) (Temporal)   Ht 5\' 2"  (1.575 m)   Wt 211 lb 9.6 oz (96 kg)   LMP 05/13/2019 Comment: patient had not had her period in a year and it came back in May and June 2020  SpO2 99%   BMI 38.70 kg/m   Physical Exam Constitutional:      Appearance: She is obese.  Neck:     Thyroid: No thyroid mass, thyromegaly or thyroid tenderness.  Cardiovascular:     Rate and Rhythm: Normal rate and regular rhythm.     Pulses: Normal pulses.  Pulmonary:     Effort: Pulmonary effort is normal.     Breath sounds: Normal breath sounds.  Musculoskeletal:     Cervical back: Normal range of motion and neck supple.     Right lower leg: No edema.     Left lower leg: No edema.   Lymphadenopathy:     Cervical: No cervical adenopathy.  Neurological:     Mental Status: She is alert and oriented to person, place, and time.  Psychiatric:        Mood and Affect: Mood normal.        Behavior: Behavior normal.        Thought Content: Thought content normal.    Assessment & Plan:  This visit occurred during the SARS-CoV-2 public health emergency.  Safety protocols were in place, including screening questions prior to the visit, additional usage of staff PPE, and extensive cleaning of exam room while observing appropriate contact time as indicated for disinfecting solutions.   Vance was seen today for acute visit.  Diagnoses and all orders for this visit:  Myalgia -     CK (Creatine Kinase) -     Antinuclear Antib (ANA) -     C-reactive protein -     TSH -     Vitamin D 1,25 dihydroxy -     CBC w/Diff -     Ambulatory referral to Rheumatology -     gabapentin (NEURONTIN) 100 MG capsule; Take 1 capsule (100 mg total) by mouth at bedtime for 7 days, THEN 1 capsule (100 mg total) 2 (two) times daily for 7 days, THEN 1 capsule (100 mg total) 3 (three) times daily for 16 days.  Chronic fatigue -     CK (Creatine Kinase) -     Antinuclear Antib (ANA) -     C-reactive protein -     TSH -     Vitamin D 1,25 dihydroxy -     CBC w/Diff -     Ambulatory referral to Rheumatology -     gabapentin (NEURONTIN) 100 MG capsule; Take 1 capsule (100 mg total) by mouth at bedtime for 7 days, THEN 1 capsule (100 mg total) 2 (two) times daily for 7 days, THEN 1 capsule (100 mg total) 3 (three) times daily for 16 days.  ANA positive -     Ambulatory referral to Rheumatology -     gabapentin (NEURONTIN) 100 MG capsule; Take 1 capsule (100 mg total) by mouth at bedtime for 7 days, THEN 1 capsule (100 mg total) 2 (two) times daily for 7 days, THEN 1 capsule (100 mg total) 3 (three) times daily for 16 days.  Other orders -     Anti-nuclear ab-titer (ANA titer)   Problem List  Items Addressed This Visit  None   Visit Diagnoses    Myalgia    -  Primary   Relevant Medications   gabapentin (NEURONTIN) 100 MG capsule   Other Relevant Orders   CK (Creatine Kinase) (Completed)   Antinuclear Antib (ANA) (Completed)   C-reactive protein (Completed)   TSH (Completed)   Vitamin D 1,25 dihydroxy   CBC w/Diff (Completed)   Ambulatory referral to Rheumatology   Chronic fatigue       Relevant Medications   gabapentin (NEURONTIN) 100 MG capsule   Other Relevant Orders   CK (Creatine Kinase) (Completed)   Antinuclear Antib (ANA) (Completed)   C-reactive protein (Completed)   TSH (Completed)   Vitamin D 1,25 dihydroxy   CBC w/Diff (Completed)   Ambulatory referral to Rheumatology   ANA positive       Relevant Medications   gabapentin (NEURONTIN) 100 MG capsule   Other Relevant Orders   Ambulatory referral to Rheumatology      Follow-up: Return if symptoms worsen or fail to improve.  Wilfred Lacy, NP

## 2021-01-12 NOTE — Patient Instructions (Signed)
Go to lab for blood draw Will send gabapentin if labs are normal. Start daily exercise: walking 15-67mins daily and weight training.

## 2021-01-14 LAB — ANTI-NUCLEAR AB-TITER (ANA TITER)
ANA TITER: 1:80 {titer} — ABNORMAL HIGH
ANA Titer 1: 1:80 {titer} — ABNORMAL HIGH

## 2021-01-14 LAB — ANA: Anti Nuclear Antibody (ANA): POSITIVE — AB

## 2021-01-15 MED ORDER — GABAPENTIN 100 MG PO CAPS: ORAL_CAPSULE | ORAL | 3 refills | Status: DC

## 2021-01-17 ENCOUNTER — Telehealth: Payer: Self-pay | Admitting: Family Medicine

## 2021-01-17 NOTE — Telephone Encounter (Signed)
error 

## 2021-02-01 ENCOUNTER — Other Ambulatory Visit: Payer: Self-pay | Admitting: Endocrinology

## 2021-02-01 ENCOUNTER — Other Ambulatory Visit: Payer: Self-pay | Admitting: *Deleted

## 2021-02-01 ENCOUNTER — Other Ambulatory Visit: Payer: Self-pay | Admitting: Family Medicine

## 2021-02-01 DIAGNOSIS — E1165 Type 2 diabetes mellitus with hyperglycemia: Secondary | ICD-10-CM

## 2021-02-01 DIAGNOSIS — Z794 Long term (current) use of insulin: Secondary | ICD-10-CM

## 2021-02-01 DIAGNOSIS — I10 Essential (primary) hypertension: Secondary | ICD-10-CM

## 2021-02-01 MED ORDER — HUMULIN R U-500 KWIKPEN 500 UNIT/ML ~~LOC~~ SOPN: PEN_INJECTOR | SUBCUTANEOUS | 0 refills | Status: DC

## 2021-02-01 NOTE — Telephone Encounter (Signed)
Last OV 01/22/21 w/[facility] Last fill for both medications 10/07/20  #90/0

## 2021-02-03 ENCOUNTER — Other Ambulatory Visit: Payer: Self-pay

## 2021-02-03 ENCOUNTER — Other Ambulatory Visit (INDEPENDENT_AMBULATORY_CARE_PROVIDER_SITE_OTHER): Payer: 59

## 2021-02-03 DIAGNOSIS — Z794 Long term (current) use of insulin: Secondary | ICD-10-CM | POA: Diagnosis not present

## 2021-02-03 DIAGNOSIS — E782 Mixed hyperlipidemia: Secondary | ICD-10-CM | POA: Diagnosis not present

## 2021-02-03 DIAGNOSIS — E1165 Type 2 diabetes mellitus with hyperglycemia: Secondary | ICD-10-CM | POA: Diagnosis not present

## 2021-02-03 LAB — COMPREHENSIVE METABOLIC PANEL
ALT: 23 U/L (ref 0–35)
AST: 18 U/L (ref 0–37)
Albumin: 4 g/dL (ref 3.5–5.2)
Alkaline Phosphatase: 74 U/L (ref 39–117)
BUN: 17 mg/dL (ref 6–23)
CO2: 31 mEq/L (ref 19–32)
Calcium: 10 mg/dL (ref 8.4–10.5)
Chloride: 97 mEq/L (ref 96–112)
Creatinine, Ser: 0.64 mg/dL (ref 0.40–1.20)
GFR: 100.44 mL/min (ref >60.00)
Glucose, Bld: 321 mg/dL — ABNORMAL HIGH (ref 70–99)
Potassium: 4.2 mEq/L (ref 3.5–5.1)
Sodium: 137 mEq/L (ref 135–145)
Total Bilirubin: 0.5 mg/dL (ref 0.2–1.2)
Total Protein: 7.7 g/dL (ref 6.0–8.3)

## 2021-02-03 LAB — LDL CHOLESTEROL, DIRECT: Direct LDL: 68 mg/dL

## 2021-02-03 LAB — LIPID PANEL
Cholesterol: 172 mg/dL (ref 0–200)
HDL: 30.9 mg/dL — ABNORMAL LOW (ref >39.00)
Total CHOL/HDL Ratio: 6
Triglycerides: 511 mg/dL — ABNORMAL HIGH (ref 0.0–149.0)

## 2021-02-03 LAB — MICROALBUMIN / CREATININE URINE RATIO
Creatinine,U: 31 mg/dL
Microalb Creat Ratio: 2.3 mg/g (ref 0.0–30.0)
Microalb, Ur: <0.7 mg/dL (ref 0.0–1.9)

## 2021-02-03 LAB — HEMOGLOBIN A1C: Hgb A1c MFr Bld: 11.2 % — ABNORMAL HIGH (ref 4.6–6.5)

## 2021-02-08 ENCOUNTER — Other Ambulatory Visit: Payer: Self-pay

## 2021-02-08 ENCOUNTER — Ambulatory Visit (INDEPENDENT_AMBULATORY_CARE_PROVIDER_SITE_OTHER): Payer: 59 | Admitting: Endocrinology

## 2021-02-08 ENCOUNTER — Encounter: Payer: Self-pay | Admitting: Endocrinology

## 2021-02-08 VITALS — BP 112/68 | HR 95 | Ht 63.0 in | Wt 209.0 lb

## 2021-02-08 DIAGNOSIS — Z794 Long term (current) use of insulin: Secondary | ICD-10-CM | POA: Diagnosis not present

## 2021-02-08 DIAGNOSIS — E1165 Type 2 diabetes mellitus with hyperglycemia: Secondary | ICD-10-CM | POA: Diagnosis not present

## 2021-02-08 DIAGNOSIS — E782 Mixed hyperlipidemia: Secondary | ICD-10-CM

## 2021-02-08 MED ORDER — FENOFIBRATE 145 MG PO TABS: 145.0000 mg | ORAL_TABLET | Freq: Every day | ORAL | 1 refills | Status: DC

## 2021-02-08 NOTE — Patient Instructions (Signed)
Insulin 50 in am, 60 before lunch, 65 before supper and 45 at bedtime  Keep going up 5 more to get < 200

## 2021-02-08 NOTE — Progress Notes (Signed)
Patient ID: Holly Ortiz, female   DOB: [birth year], 55 y.o.   MRN: [id number]          Reason for Appointment: Follow-up for Type 2 Diabetes   History of Present Illness:          Date of diagnosis of type 2 diabetes mellitus:   At age 86      Background history:  Her diabetes was diagnosed incidentally when she was having unrelated problems She took various oral agents including metformin for several years About 5 years ago she was started on insulin and she apparently was taking only one kind of insulin possibly a premixed insulin twice a day She said that her blood sugars have been usually poorly controlled with A1c usually 8-9% She was also taking metformin, Amaryl and Januvia prior to moving here from [place]  Recent history:    INSULIN regimen is: Humulin U-500, 45-50 units 4 times a day  Non-insulin hypoglycemic drugs the patient is taking are: Farxiga 10 mg daily   Current management, blood sugar patterns and problems identified:   Her A1c is 11.2 compared to 9.8   She did not bring her monitor for any blood sugar record today for review  Using [employer] meter and apparently her insurance will not cover test strips; also cannot use freestyle libre because of high out-of-pocket expense  She thinks some of her high readings in January were from her insulin being out of stock for 3 weeks and also the pharmacy was late filling a prescription this month  By history her blood sugars appear to be progressively higher from morning to nighttime  She was told to take as much as 60 units of insulin but she is still taking mostly 45 units except 50 in the morning  No hypoglycemia   [place] does not cause side effects and she is taking it regularly reportedly  She is trying to get some walking with her dogs  She does not want to see the dietitian despite having difficulty with weight loss and not always control her diet  Her weight is up another 3 pounds  Dinner at 5  pm          Side effects from medications have been: Vomiting from Ozempic, hives from [place]; abdominal swelling/weight gain with Lantus and Basaglar   Glucose monitoring:  done 1-2 times a day         Glucometer: [employer] brand       Blood Glucose readings from her meter recall:    PRE-MEAL Fasting Lunch Dinner Bedtime Overall  Glucose range:  180-200 ?:  200  280   Mean/median:          Dietician visit, most recent: A few years ago in [place]  Weight history:  Wt Readings from Last 3 Encounters:  02/08/21 209 lb (94.8 kg)  01/12/21 211 lb 9.6 oz (96 kg)  10/05/20 206 lb 6.4 oz (93.6 kg)    Glycemic control:   Lab Results  Component Value Date   HGBA1C 11.2 (H) 02/03/2021   HGBA1C 9.8 (H) 08/05/2020   HGBA1C 11.2 (H) 07/07/2020   Lab Results  Component Value Date   MICROALBUR <0.7 02/03/2021   LDLCALC 71 07/21/2019   CREATININE 0.64 02/03/2021   Lab Results  Component Value Date   MICRALBCREAT 2.3 02/03/2021    Lab Results  Component Value Date   FRUCTOSAMINE 314 (H) 09/30/2020   FRUCTOSAMINE 408 (H) 04/09/2020   FRUCTOSAMINE 325 (H) 07/21/2019  Lab on 02/03/2021  Component Date Value Ref Range Status  . Microalb, Ur 02/03/2021 <0.7  0.0 - 1.9 mg/dL Final  . Creatinine,U 02/03/2021 31.0  mg/dL Final  . Microalb Creat Ratio 02/03/2021 2.3  0.0 - 30.0 mg/g Final  . Cholesterol 02/03/2021 172  0 - 200 mg/dL Final   ATP III Classification       Desirable:  < 200 mg/dL               Borderline High:  200 - 239 mg/dL          High:  > = 240 mg/dL  . Triglycerides 02/03/2021 511.0 Triglyceride is over 400; calculations on Lipids are invalid.* 0.0 - 149.0 mg/dL Final   Normal:  <150 mg/dLBorderline High:  150 - 199 mg/dL  . HDL 02/03/2021 30.90* >39.00 mg/dL Final  . Total CHOL/HDL Ratio 02/03/2021 6   Final                  Men          Women1/2 Average Risk     3.4          3.3Average Risk          5.0          4.42X Average Risk          9.6           7.13X Average Risk          15.0          11.0                      . Sodium 02/03/2021 137  135 - 145 mEq/L Final  . Potassium 02/03/2021 4.2  3.5 - 5.1 mEq/L Final  . Chloride 02/03/2021 97  96 - 112 mEq/L Final  . CO2 02/03/2021 31  19 - 32 mEq/L Final  . Glucose, Bld 02/03/2021 321* 70 - 99 mg/dL Final  . BUN 02/03/2021 17  6 - 23 mg/dL Final  . Creatinine, Ser 02/03/2021 0.64  0.40 - 1.20 mg/dL Final  . Total Bilirubin 02/03/2021 0.5  0.2 - 1.2 mg/dL Final  . Alkaline Phosphatase 02/03/2021 74  39 - 117 U/L Final  . AST 02/03/2021 18  0 - 37 U/L Final  . ALT 02/03/2021 23  0 - 35 U/L Final  . Total Protein 02/03/2021 7.7  6.0 - 8.3 g/dL Final  . Albumin 02/03/2021 4.0  3.5 - 5.2 g/dL Final  . GFR 02/03/2021 100.44  >60.00 mL/min Final   Calculated using the CKD-EPI Creatinine Equation (2021)  . Calcium 02/03/2021 10.0  8.4 - 10.5 mg/dL Final  . Hgb A1c MFr Bld 02/03/2021 11.2* 4.6 - 6.5 % Final   Glycemic Control Guidelines for People with Diabetes:Non Diabetic:  <6%Goal of Therapy: <7%Additional Action Suggested:  >8%   . Direct LDL 02/03/2021 68.0  mg/dL Final   Optimal:  <100 mg/dLNear or Above Optimal:  100-129 mg/dLBorderline High:  130-159 mg/dLHigh:  160-189 mg/dLVery High:  >190 mg/dL    Allergies as of 02/08/2021      Reactions   Ozempic (0.25 Or 0.5 Mg-dose) [semaglutide(0.25 Or 0.5mg -dos)] Nausea And Vomiting   Actos [pioglitazone Hydrochloride] Other (See Comments)   Wt gain   Lisinopril    Angioedema, including likely GI involvement   Metformin And Related Diarrhea, Nausea And Vomiting      Medication List       Accurate as  of February 08, 2021 10:39 AM. If you have any questions, ask your nurse or doctor.        acetaminophen 500 MG tablet Commonly known as: TYLENOL Take 2 tablets (1,000 mg total) by mouth every 6 (six) hours as needed.   amLODipine 10 MG tablet Commonly known as: NORVASC Take 1 tablet by mouth once daily   atorvastatin 40 MG  tablet Commonly known as: LIPITOR Take 1 tablet (40 mg total) by mouth daily. What changed: Another medication with the same name was removed. Continue taking this medication, and follow the directions you see here. Changed by: Elayne Snare, MD   BD Pen Needle Nano 2nd Gen 32G X 4 MM Misc Generic drug: Insulin Pen Needle Use four times daily to inject insulin.   escitalopram 20 MG tablet Commonly known as: LEXAPRO Take 1 tablet by mouth once daily   Farxiga 10 MG Tabs tablet Generic drug: dapagliflozin propanediol Take 1 tablet by mouth once daily   fenofibrate 145 MG tablet Commonly known as: Tricor Take 1 tablet (145 mg total) by mouth daily. Started by: Elayne Snare, MD   FreeStyle Libre 2 Sensor Misc 2 Devices by Does not apply route every 14 (fourteen) days.   gabapentin 100 MG capsule Commonly known as: NEURONTIN Take 1 capsule (100 mg total) by mouth at bedtime for 7 days, THEN 1 capsule (100 mg total) 2 (two) times daily for 7 days, THEN 1 capsule (100 mg total) 3 (three) times daily for 16 days. Start taking on: January 15, 2021   HumuLIN R U-500 KwikPen 500 UNIT/ML kwikpen Generic drug: insulin regular human CONCENTRATED INJECT 45 UNITS UNDER THE SKIN BEFORE LUNCH, 20-25 UNITS AT SUPPER, 30 UNITS AT BEDTIME. Call to schedule follow up   hydrochlorothiazide 12.5 MG tablet Commonly known as: HYDRODIURIL Take 1 tablet by mouth once daily   Iron (Ferrous Sulfate) 325 (65 Fe) MG Tabs Take one tablet daily.   melatonin 5 MG Tabs Take 5 mg by mouth at bedtime.   montelukast 10 MG tablet Commonly known as: SINGULAIR TAKE 1 TABLET BY MOUTH AT BEDTIME   ondansetron 4 MG tablet Commonly known as: Zofran Take 1 tablet (4 mg total) by mouth every 8 (eight) hours as needed for nausea or vomiting.   vitamin B-12 1000 MCG tablet Commonly known as: CYANOCOBALAMIN Take 1,000 mcg by mouth daily.   Vitamin D 50 MCG (2000 UT) Caps Take 2,000 Units by mouth daily.   zinc  gluconate 50 MG tablet Take 50 mg by mouth daily.       Allergies:  Allergies  Allergen Reactions  . Ozempic (0.25 Or 0.5 Mg-Dose) [Semaglutide(0.25 Or 0.5mg -Dos)] Nausea And Vomiting  . Actos [Pioglitazone Hydrochloride] Other (See Comments)    Wt gain  . Lisinopril     Angioedema, including likely GI involvement  . Metformin And Related Diarrhea and Nausea And Vomiting    Past Medical History:  Diagnosis Date  . Allergy   . 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-I)   . Anxiety   . Asthma   . Diabetes mellitus    type II  . Hyperlipidemia   . Hypertension   . Urticaria, chronic   . UTI (lower urinary tract infection)     Past Surgical History:  Procedure Laterality Date  . ABDOMINAL HYSTERECTOMY  08/07/2019  . breast lift  1993  . CATARACT EXTRACTION, BILATERAL Bilateral 2014  . LAPAROSCOPIC VAGINAL HYSTERECTOMY WITH SALPINGO OOPHORECTOMY Bilateral 08/07/2019   Procedure:  LAPAROSCOPIC ASSISTED VAGINAL HYSTERECTOMY WITH SALPINGO OOPHORECTOMY, possible abdominal hysterectomy;  Surgeon: Everlene Farrier, MD;  Location: [facility];  Service: Gynecology;  Laterality: Bilateral;  possible abdominal hysterectomy pt is diabetic and hypertensive  . SOFT TISSUE MASS EXCISION Right 2016   [place] area  . TUBAL LIGATION  1995  . tummy tuck  1993    Family History  Problem Relation Age of Onset  . Hyperlipidemia Other   . Heart disease Other   . Diabetes Father   . Hypertension Father   . COPD Father        Cause of death  . Colon cancer Mother   . Hypertension Mother   . Hypertension Sister   . Diabetes Sister   . Hypertension Brother   . Cancer Sister        Brain glioblastoma  . Diabetes Sister     Social History:  reports that she has never smoked. She has never used smokeless tobacco. She reports that she does not drink alcohol and does not use drugs.   Review of Systems   Lipid history: On Lipitor 40 mg from her PCP   Not taking any  medication for triglycerides were somewhat higher than usual    Lab Results  Component Value Date   CHOL 172 02/03/2021   HDL 30.90 (L) 02/03/2021   LDLCALC 71 07/21/2019   LDLDIRECT 68.0 02/03/2021   TRIG (H) 02/03/2021    511.0 Triglyceride is over 400; calculations on Lipids are invalid.   CHOLHDL 6 02/03/2021           Hypertension: Has been present since age 80, on amlodipine and HCTZ 12.5mg  prescribed by her PCP  May have had angioedema with lisinopril   BP Readings from Last 3 Encounters:  02/08/21 112/68  01/12/21 134/70  10/05/20 124/82    Most recent eye exam was in 2017  Most recent foot exam: 9/19  Currently known complications of diabetes: None  LABS:  Lab on 02/03/2021  Component Date Value Ref Range Status  . Microalb, Ur 02/03/2021 <0.7  0.0 - 1.9 mg/dL Final  . Creatinine,U 02/03/2021 31.0  mg/dL Final  . Microalb Creat Ratio 02/03/2021 2.3  0.0 - 30.0 mg/g Final  . Cholesterol 02/03/2021 172  0 - 200 mg/dL Final   ATP III Classification       Desirable:  < 200 mg/dL               Borderline High:  200 - 239 mg/dL          High:  > = 240 mg/dL  . Triglycerides 02/03/2021 511.0 Triglyceride is over 400; calculations on Lipids are invalid.* 0.0 - 149.0 mg/dL Final   Normal:  <150 mg/dLBorderline High:  150 - 199 mg/dL  . HDL 02/03/2021 30.90* >39.00 mg/dL Final  . Total CHOL/HDL Ratio 02/03/2021 6   Final                  Men          Women1/2 Average Risk     3.4          3.3Average Risk          5.0          4.42X Average Risk          9.6          7.13X Average Risk          15.0  11.0                      . Sodium 02/03/2021 137  135 - 145 mEq/L Final  . Potassium 02/03/2021 4.2  3.5 - 5.1 mEq/L Final  . Chloride 02/03/2021 97  96 - 112 mEq/L Final  . CO2 02/03/2021 31  19 - 32 mEq/L Final  . Glucose, Bld 02/03/2021 321* 70 - 99 mg/dL Final  . BUN 02/03/2021 17  6 - 23 mg/dL Final  . Creatinine, Ser 02/03/2021 0.64  0.40 - 1.20 mg/dL Final   . Total Bilirubin 02/03/2021 0.5  0.2 - 1.2 mg/dL Final  . Alkaline Phosphatase 02/03/2021 74  39 - 117 U/L Final  . AST 02/03/2021 18  0 - 37 U/L Final  . ALT 02/03/2021 23  0 - 35 U/L Final  . Total Protein 02/03/2021 7.7  6.0 - 8.3 g/dL Final  . Albumin 02/03/2021 4.0  3.5 - 5.2 g/dL Final  . GFR 02/03/2021 100.44  >60.00 mL/min Final   Calculated using the CKD-EPI Creatinine Equation (2021)  . Calcium 02/03/2021 10.0  8.4 - 10.5 mg/dL Final  . Hgb A1c MFr Bld 02/03/2021 11.2* 4.6 - 6.5 % Final   Glycemic Control Guidelines for People with Diabetes:Non Diabetic:  <6%Goal of Therapy: <7%Additional Action Suggested:  >8%   . Direct LDL 02/03/2021 68.0  mg/dL Final   Optimal:  <100 mg/dLNear or Above Optimal:  100-129 mg/dLBorderline High:  130-159 mg/dLHigh:  160-189 mg/dLVery High:  >190 mg/dL    Physical Examination:  BP 112/68   Pulse 95   Ht 5\' 3"  (1.6 m)   Wt 209 lb (94.8 kg)   LMP 05/13/2019 Comment: patient had not had her period in a year and it came back in May and June 2020  SpO2 99%   BMI 37.02 kg/m       ASSESSMENT:  Diabetes type 2, with history of persistent hyperglycemia, insulin-dependent  See history of present illness for detailed discussion of current diabetes management, blood sugar patterns and problems identified  A1c is usually high at 11.2  She is insulin resistant  She likely has poor control because of not taking enough insulin, currently taking U-500 insulin along with [place] She has not increased her insulin doses as directed on her last visit Also likely blood sugar monitoring is inadequate and she does not bring records of her blood sugar monitoring Previously had checked on the cost of CGM and this was not affordable for her  Currently requiring at least 200 units of insulin a day  Hypertension: Blood pressure is well controlled  LIPIDS: She has triglycerides over 500, currently untreated, possibly from worsening diabetes control  also Also continues to gain weight  PLAN:   Check blood sugars more regularly and bring blood sugar record for review on next visit She will take at least 50 units of insulin in the morning, 60 at lunch and 65 at dinnertime but reduce bedtime dose to 45 She needs to continue going up 5 units until her blood sugars are consistently below 200 later in the day Consistent exercise She will look into the [employer] pump coverage and discussed in detail how this would work, will verify her benefits She will let us know if she has any difficulty with insulin availability  FENOFIBRATE 145 mg daily and will need fasting labs when blood sugars are better controlled  Patient Instructions  Insulin 50 in am, 60 before lunch, 65 before  supper and 45 at bedtime  Keep going up 5 more to get < 200     Elayne Snare 02/08/2021, 10:39 AM   Note: This office note was prepared with Dragon voice recognition system technology. Any transcriptional errors that result from this process are unintentional.

## 2021-02-15 ENCOUNTER — Other Ambulatory Visit: Payer: Self-pay | Admitting: Endocrinology

## 2021-02-15 DIAGNOSIS — Z794 Long term (current) use of insulin: Secondary | ICD-10-CM

## 2021-02-15 DIAGNOSIS — E1165 Type 2 diabetes mellitus with hyperglycemia: Secondary | ICD-10-CM

## 2021-02-16 ENCOUNTER — Telehealth: Payer: Self-pay | Admitting: Nutrition

## 2021-02-22 ENCOUNTER — Encounter: Payer: Self-pay | Admitting: Internal Medicine

## 2021-02-22 ENCOUNTER — Ambulatory Visit: Payer: 59 | Admitting: Internal Medicine

## 2021-02-22 ENCOUNTER — Other Ambulatory Visit: Payer: Self-pay

## 2021-02-22 VITALS — BP 147/87 | HR 94 | Ht 63.5 in | Wt 212.2 lb

## 2021-02-22 DIAGNOSIS — L509 Urticaria, unspecified: Secondary | ICD-10-CM

## 2021-02-22 DIAGNOSIS — R768 Other specified abnormal immunological findings in serum: Secondary | ICD-10-CM

## 2021-02-22 DIAGNOSIS — Z794 Long term (current) use of insulin: Secondary | ICD-10-CM

## 2021-02-22 DIAGNOSIS — M359 Systemic involvement of connective tissue, unspecified: Secondary | ICD-10-CM | POA: Insufficient documentation

## 2021-02-22 DIAGNOSIS — M2550 Pain in unspecified joint: Secondary | ICD-10-CM

## 2021-02-22 NOTE — Progress Notes (Signed)
Office Visit Note  Patient: Holly Ortiz             Date of Birth: 07/05/1966           MRN: [id number]             PCP: Libby Maw, MD Referring: Libby Maw,* Visit Date: 02/22/2021 Occupation: Bank account and teller  Subjective:  New Patient (Initial Visit) (Patient complains of joint pain and stiffness, worse on the left side. Pain and stiffness particularly in the left elbow and left leg. Patient complains of intermittent hives. )   History of Present Illness: Holly Ortiz is a 55 y.o. female here for evaluation of positive ANA with chronic joint pains, fatigue, and intermittent hives for years. She describes symptoms starting since around 2007. She has had pain in joints and muscles at various areas to some extent most of the time. She also developed a problem with recurrent urticaria rashes at multiple areas, most often after increased stress or other provocative events such as infection and antibiotic use. Since that time she has had workup with allergy and immunology clinics and dermatology clinic with no specific cause able to be found. A more recent symptom is change in sensation in her legs and feet. She notices swelling and discomfort after prolonged standing or prolonged sitting. She had a few episodes of becoming dizzy and unstable after standing and a few falls.  Labs reviewed 01/2021 ANA 1:80 homogenous, speckled CBC wnl TSH wnl CK wnl CRP wnl  Activities of Daily Living:  Patient reports morning stiffness for 24 hours.   Patient Reports nocturnal pain.  Difficulty dressing/grooming: Reports Difficulty climbing stairs: Reports Difficulty getting out of chair: Reports Difficulty using hands for taps, buttons, cutlery, and/or writing: Reports  Review of Systems  Constitutional: Positive for fatigue.  HENT: Positive for mouth dryness. Negative for mouth sores and nose dryness.   Eyes: Positive for itching and visual disturbance.  Negative for pain and dryness.  Respiratory: Negative for cough, hemoptysis, shortness of breath and difficulty breathing.   Cardiovascular: Positive for chest pain and swelling in legs/feet. Negative for palpitations.  Gastrointestinal: Positive for constipation. Negative for abdominal pain, blood in stool and diarrhea.  Endocrine: Negative for increased urination.  Genitourinary: Negative for painful urination.  Musculoskeletal: Positive for arthralgias, joint pain, myalgias, muscle weakness, morning stiffness, muscle tenderness and myalgias. Negative for joint swelling.  Skin: Positive for rash. Negative for color change and redness.  Allergic/Immunologic: Positive for susceptible to infections.  Neurological: Positive for dizziness, numbness, headaches, parasthesias and weakness. Negative for memory loss.  Hematological: Negative for swollen glands.  Psychiatric/Behavioral: Positive for confusion and sleep disturbance.    PMFS History:  Patient Active Problem List   Diagnosis Date Noted  . Positive ANA (antinuclear antibody) 02/22/2021  . Iron deficiency 07/07/2020  . Acute midline low back pain without sciatica 01/14/2020  . Anemia 12/02/2019  . Lightheadedness 12/02/2019  . Menorrhagia 08/07/2019  . Abnormal uterine bleeding 08/07/2019  . Anxiety and depression 07/03/2018  . DKA (diabetic ketoacidoses) 03/28/2018  . Tachycardia 03/28/2018  . Urticaria 03/28/2018  . Hyperosmolar hyperglycemia 01/04/2018  . AKI (acute kidney injury) ([place]) 01/04/2018  . Hyperglycemia 01/03/2018  . 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-I)   . Asthma   . Type 2 diabetes mellitus with complication, with long-term current use of insulin ([place])   . Elevated cholesterol   . Essential hypertension     Past Medical History:  Diagnosis  Date  . Allergy   . 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-I)   . Anxiety   . Asthma   . Diabetes mellitus    type  II  . Hyperlipidemia   . Hypertension   . Urticaria, chronic   . UTI (lower urinary tract infection)     Family History  Problem Relation Age of Onset  . Hyperlipidemia Other   . Heart disease Other   . Diabetes Father   . Hypertension Father   . COPD Father        Cause of death  . Colon cancer Mother   . Hypertension Mother   . Hypertension Sister   . Diabetes Sister   . Hypertension Brother   . Cancer Sister        Brain glioblastoma  . Diabetes Sister    Past Surgical History:  Procedure Laterality Date  . ABDOMINAL HYSTERECTOMY  08/07/2019  . breast lift  1993  . CATARACT EXTRACTION, BILATERAL Bilateral 2014  . LAPAROSCOPIC VAGINAL HYSTERECTOMY WITH SALPINGO OOPHORECTOMY Bilateral 08/07/2019   Procedure: LAPAROSCOPIC ASSISTED VAGINAL HYSTERECTOMY WITH SALPINGO OOPHORECTOMY, possible abdominal hysterectomy;  Surgeon: Everlene Farrier, MD;  Location: [facility];  Service: Gynecology;  Laterality: Bilateral;  possible abdominal hysterectomy pt is diabetic and hypertensive  . SOFT TISSUE MASS EXCISION Right 2016   [place] area  . TUBAL LIGATION  1995  . tummy tuck  1993   Social History   Social History Narrative  . Not on file   Immunization History  Administered Date(s) Administered  . DTaP 07/04/2009  . Influenza,inj,Quad PF,6+ Mos 01/05/2018, 08/28/2018, 09/05/2019, 10/05/2020  . PFIZER(Purple Top)SARS-COV-2 Vaccination 02/03/2020, 03/02/2020  . Pneumococcal Polysaccharide-23 01/05/2018  . Tdap 12/04/2012     Objective: Vital Signs: BP (!) 147/87 (BP Location: Right Arm, Patient Position: Sitting, Cuff Size: Normal)   Pulse 94   Ht 5' 3.5" (1.613 m)   Wt 212 lb 3.2 oz (96.3 kg)   LMP 05/13/2019 Comment: patient had not had her period in a year and it came back in May and June 2020  BMI 37.00 kg/m    Physical Exam Constitutional:      Appearance: She is obese.  HENT:     Right Ear: External ear normal.     Left Ear: External ear normal.     Mouth/Throat:      Mouth: Mucous membranes are moist.     Pharynx: Oropharynx is clear.  Eyes:     Conjunctiva/sclera: Conjunctivae normal.  Cardiovascular:     Rate and Rhythm: Normal rate and regular rhythm.  Pulmonary:     Effort: Pulmonary effort is normal.     Breath sounds: Normal breath sounds.  Skin:    General: Skin is warm and dry.     Findings: No rash.  Neurological:     General: No focal deficit present.     Mental Status: She is alert.  Psychiatric:        Mood and Affect: Mood normal.     Musculoskeletal Exam:  Neck full ROM no tenderness Shoulders full ROM no tenderness or swelling, tenderness to palpation over scapula Elbows full ROM no tenderness or swelling Wrists full ROM no tenderness or swelling Fingers full ROM no tenderness or swelling Knees full ROM tenderness to pressure no swelling warmth or erythema, some crepitus present Ankles full ROM no tenderness or swelling   Investigation: No additional findings.  Imaging: No results found.  Recent Labs: Lab Results  Component Value  Date   WBC 7.8 01/12/2021   HGB 13.1 01/12/2021   PLT 335.0 01/12/2021   NA 137 02/03/2021   K 4.2 02/03/2021   CL 97 02/03/2021   CO2 31 02/03/2021   GLUCOSE 321 (H) 02/03/2021   BUN 17 02/03/2021   CREATININE 0.64 02/03/2021   BILITOT 0.5 02/03/2021   ALKPHOS 74 02/03/2021   AST 18 02/03/2021   ALT 23 02/03/2021   PROT 7.7 02/03/2021   ALBUMIN 4.0 02/03/2021   CALCIUM 10.0 02/03/2021   GFRAA >60 07/30/2019    Speciality Comments: No specialty comments available.  Procedures:  No procedures performed Allergies: Ozempic (0.25 or 0.5 mg-dose) [semaglutide(0.25 or 0.5mg -dos)], Actos [pioglitazone hydrochloride], Lisinopril, and Metformin and related   Assessment / Plan:     Visit Diagnoses: Positive ANA (antinuclear antibody) - Plan: RNP Antibody, Anti-Smith antibody, Sjogrens syndrome-A extractable nuclear antibody, Sjogrens syndrome-B extractable nuclear antibody, Anti-DNA  antibody, double-stranded, C3 and C4, Sedimentation rate  Positive ANA test but does not demonstrate clinical criteria of SLE. She has fatigue and arthralgias fairly generalized without inflammatory joint changes seen on exam. Urticarial sounding rashes but none present at this time, and not particularly photosensitive by history and distribution. Checking ENA titers RNP, Smith, SSA, SSB, dsDNA, complements, and sedimentation rate.  Urticaria - Plan: Complement component c1q, Sedimentation rate  History of chronic urticarial type rashes possibly and has had angioedema, although provoked with medication. Will check complement levels C1q screening hypocomplementemic urticarial vasculitis although clinically less likely. Arthralgia is also a common feature of chronic urticaria even without underlying systemic CTD.  Arthralgias  Joint pains are fairly diffuse there is some osteoarthritis as well at least of knee. May be inflammatory, may be CU related, maybe osteoarthritis or central pain sensitization. Will f/u after inflammatory workup to inform treatment plan.  Orders: Orders Placed This Encounter  Procedures  . RNP Antibody  . Anti-Smith antibody  . Sjogrens syndrome-A extractable nuclear antibody  . Sjogrens syndrome-B extractable nuclear antibody  . Anti-DNA antibody, double-stranded  . C3 and C4  . Complement component c1q  . Sedimentation rate   No orders of the defined types were placed in this encounter.   Follow-Up Instructions: Return in about 2 weeks (around 03/08/2021) for New pt f/u ANA, arthralgia, hives.   Collier Salina, MD  Note - This record has been created using [employer].  Chart creation errors have been sought, but may not always  have been located. Such creation errors do not reflect on  the standard of medical care.

## 2021-02-22 NOTE — Patient Instructions (Signed)
Erythrocyte Sedimentation Rate Test Why am I having this test? The erythrocyte sedimentation rate (ESR) test is used to help find illnesses related to:  Sudden (acute) or long-term (chronic) infections.  Inflammation.  The body's disease-fighting system attacking healthy cells (autoimmune diseases).  Cancer.  Tissue death. If you have symptoms that may be related to any of these illnesses, your health care provider may do an ESR test before doing more specific tests. If you have an inflammatory immune disease, such as rheumatoid arthritis, you may have this test to help monitor your therapy. What is being tested? This test measures how long it takes for your red blood cells (erythrocytes) to settle in a solution over a certain amount of time (sedimentation rate). When you have an infection or inflammation, your red blood cells clump together and settle faster. The sedimentation rate provides information about how much inflammation is present in the body. What kind of sample is taken? A blood sample is required for this test. It is usually collected by inserting a needle into a blood vessel.   How do I prepare for this test? Follow any instructions from your health care provider about changing or stopping your regular medicines. Tell a health care provider about:  Any allergies you have.  All medicines you are taking, including vitamins, herbs, eye drops, creams, and over-the-counter medicines.  Any blood disorders you have.  Any surgeries you have had.  Any medical conditions you have, such as thyroid or kidney disease.  Whether you are pregnant or may be pregnant. How are the results reported? Your results will be reported as a value that measures sedimentation rate in millimeters per hour (mm/hr). Your health care provider will compare your results to normal ranges that were established after testing a large group of people (reference values). Reference values may vary among  labs and hospitals. For this test, common reference values, which vary by age and gender, are:  Newborn: 0-2 mm/hr.  Child, up to puberty: 0-10 mm/hr.  Female: ? Under 50 years: 0-20 mm/hr. ? 50-85 years: 0-30 mm/hr. ? Over 85 years: 0-42 mm/hr.  Female: ? Under 50 years: 0-15 mm/hr. ? 50-85 years: 0-20 mm/hr. ? Over 85 years: 0-30 mm/hr. Certain conditions or medicines may cause ESR levels to be falsely lower or higher, such as:  Pregnancy.  Obesity.  Steroids, birth control pills, and blood thinners.  Thyroid or kidney disease. What do the results mean? Results that are within reference values are considered normal, meaning that the level of inflammation in your body is healthy. High ESR levels mean that there is inflammation in your body. You will have more tests to help make a diagnosis. Inflammation may result from many different conditions or injuries. Talk with your health care provider about what your results mean. Questions to ask your health care provider Ask your health care provider, or the department that is doing the test:  When will my results be ready?  How will I get my results?  What are my treatment options?  What other tests do I need?  What are my next steps? Summary  The erythrocyte sedimentation rate (ESR) test is used to help find illnesses associated with sudden (acute) or long-term (chronic) infections, inflammation, autoimmune diseases, cancer, or tissue death.  If you have symptoms that may be related to any of these illnesses, your health care provider may do an ESR test before doing more specific tests. If you have an inflammatory immune disease, such as  rheumatoid arthritis, you may have this test to help monitor your therapy.  This test measures how long it takes for your red blood cells (erythrocytes) to settle in a solution over a certain amount of time (sedimentation rate). This provides information about how much inflammation is present  in the body.    Complement Assay Test Why am I having this test? Complement refers to a group of proteins that are part of the body's disease-fighting system (immune system). A complement assay test provides information about some or all of these proteins. You may have this test:  To diagnose a lack, or deficiency, of certain complement proteins. Deficiencies can be passed from parent to child (inherited).  To monitor an infection or autoimmune disease.  If you have unexplained inflammation or swelling (edema).  If you have bacterial infections again and again. What is being tested? This test can be used to measure:  Total complement. This is the total number of protein complements in your blood.  The number of each kind of complement in your blood. The nine main kinds of complement are labeled C1 through C9. Some of these complements, such as C3 and C4, are especially important and have many functions in the body. Depending on why you are having the test, your health care provider may test your total complement or only some individual complements, such as C3 and C4. The total complement assay test may be done before individual complements are tested. What kind of sample is taken? A blood sample is required for this test. It is usually collected by inserting a needle into a blood vessel.   Tell a health care provider about:  Any allergies you have.  All medicines you are taking, including vitamins, herbs, eye drops, creams, and over-the-counter medicines.  Any blood disorders you have.  Any surgeries you have had.  Any medical conditions you have.  Whether you are pregnant or may be pregnant. How are the results reported? Your results will be reported as a value that tells you how much complement is in your blood. This will be given as units per milliliter of blood (units/mL) or as milligrams per deciliter of blood (mg/dL). Your results may be reported as total complement, or as  individual complements, or both. Your health care provider will compare your results to normal ranges that were established after testing a large group of people (reference ranges). Reference ranges may vary among labs and hospitals. For this test, reference ranges for some of the most commonly measured complement assays may be:  Total complement: 30-75 units/mL.  C2: 1-4 mg/dL.  C3: 75-175 mg/dL.  C4: 22-45 units/mL. What do the results mean? Results within reference ranges are considered normal, which means you have a normal amount of complement in your blood. Results that are higher than the reference ranges may be caused by:  Inflammatory disease.  Heart attack.  Cancer. Complement deficiencies, or results lower than the reference ranges, may be caused by:  Certain inherited conditions.  Autoimmune disease.  Certain liver diseases.  Malnutrition.  Certain types of anemia that result in breakdown of red blood cells (hemolytic anemia). Talk with your health care provider about what your results mean. Questions to ask your health care provider Ask your health care provider, or the department that is doing the test:  When will my results be ready?  How will I get my results?  What are my treatment options?  What other tests do I need?  What are my  next steps? Summary  Complement refers to a group of proteins that are part of the body's disease-fighting system (immune system). A complement assay test can provide information about some or all of these proteins.  You may have a complement assay test to help diagnose a complement deficiency, and to monitor some infections or autoimmune disease.  Talk with your health care provider about what your results mean.

## 2021-02-23 NOTE — Telephone Encounter (Signed)
[facility] patient Holly Ortiz (DOB 1966-07-21) will be using [facility] (DME) for her VGO supply and is ready to move forward with training . Can the office please fax over his prescription, insurance information and patient notes over to [phone].   Per Dr. Ronnie Derby email, I am forwarding this message to the CMAs to place this order for  V-GO 20s, 1 qd.  X30 days with 11 refills

## 2021-02-24 DIAGNOSIS — M2550 Pain in unspecified joint: Secondary | ICD-10-CM | POA: Insufficient documentation

## 2021-02-25 NOTE — Progress Notes (Signed)
Sedimentation rate is within the expected range for her suggesting against inflammation causing current symptoms. She does have some positive antibody tests so possible our visit is during a period of less activity. One other test associated with hives and autoimmuune disease is still pending and will f/u.

## 2021-02-26 ENCOUNTER — Other Ambulatory Visit: Payer: Self-pay

## 2021-02-26 ENCOUNTER — Other Ambulatory Visit: Payer: Self-pay | Admitting: Family Medicine

## 2021-02-26 DIAGNOSIS — F32A Depression, unspecified: Secondary | ICD-10-CM

## 2021-02-26 DIAGNOSIS — F419 Anxiety disorder, unspecified: Secondary | ICD-10-CM

## 2021-02-26 LAB — SEDIMENTATION RATE: Sed Rate: 31 mm/h — ABNORMAL HIGH (ref 0–30)

## 2021-02-26 LAB — SJOGRENS SYNDROME-A EXTRACTABLE NUCLEAR ANTIBODY: SSA (Ro) (ENA) Antibody, IgG: <1 AI

## 2021-02-26 LAB — C3 AND C4
C3 Complement: 178 mg/dL (ref 83–193)
C4 Complement: 52 mg/dL (ref 15–57)

## 2021-02-26 LAB — COMPLEMENT COMPONENT C1Q: Complement C1Q: 9.3 mg/dL — ABNORMAL HIGH (ref 5.0–8.6)

## 2021-02-26 LAB — ANTI-SMITH ANTIBODY: ENA SM Ab Ser-aCnc: <1 AI

## 2021-02-26 LAB — RNP ANTIBODY: Ribonucleic Protein(ENA) Antibody, IgG: <1 AI

## 2021-02-26 LAB — SJOGRENS SYNDROME-B EXTRACTABLE NUCLEAR ANTIBODY: SSB (La) (ENA) Antibody, IgG: <1 AI

## 2021-02-26 LAB — ANTI-DNA ANTIBODY, DOUBLE-STRANDED: ds DNA Ab: 38 IU/mL — ABNORMAL HIGH

## 2021-02-26 MED ORDER — DAPAGLIFLOZIN PROPANEDIOL 10 MG PO TABS: 10.0000 mg | ORAL_TABLET | Freq: Every day | ORAL | 0 refills | Status: DC

## 2021-02-28 MED ORDER — ESCITALOPRAM OXALATE 20 MG PO TABS: 20.0000 mg | ORAL_TABLET | Freq: Every day | ORAL | 0 refills | Status: DC

## 2021-03-08 ENCOUNTER — Other Ambulatory Visit: Payer: Self-pay

## 2021-03-08 ENCOUNTER — Ambulatory Visit: Payer: 59 | Admitting: Internal Medicine

## 2021-03-08 ENCOUNTER — Encounter: Payer: Self-pay | Admitting: Internal Medicine

## 2021-03-08 VITALS — BP 146/82 | HR 102 | Ht 63.0 in | Wt 212.6 lb

## 2021-03-08 DIAGNOSIS — R768 Other specified abnormal immunological findings in serum: Secondary | ICD-10-CM | POA: Diagnosis not present

## 2021-03-08 DIAGNOSIS — M2550 Pain in unspecified joint: Secondary | ICD-10-CM

## 2021-03-08 DIAGNOSIS — N179 Acute kidney failure, unspecified: Secondary | ICD-10-CM

## 2021-03-08 NOTE — Progress Notes (Signed)
Office Visit Note  Patient: Holly Ortiz             Date of Birth: [birth year]           MRN: [id number]             PCP: Libby Maw, MD Referring: Libby Maw,* Visit Date: 03/08/2021   Subjective:  Follow-up (Patient notices improvement in left leg pain. Patient complains of left-sided neck and left shoulder pain. /)   History of Present Illness: Holly Ortiz is a 55 y.o. female here for follow up for her joint pains, urticaria and abnormal lab and inflammatory markers. Lab results at last visit did show an elevated dsDNA, borderline ESR. The left leg pain at that time is doing somewhat better now. However also now has left shoulder pain some radiation down to hand and hand swelling. She is not having significant rashes at this time. Otherwise she denies any major events or changes.   Review of Systems  Constitutional: Positive for fatigue.  HENT: Positive for mouth dryness. Negative for mouth sores and nose dryness.   Eyes: Positive for itching and visual disturbance. Negative for pain and dryness.  Respiratory: Positive for shortness of breath and difficulty breathing. Negative for cough and hemoptysis.   Cardiovascular: Positive for chest pain and swelling in legs/feet. Negative for palpitations.  Gastrointestinal: Positive for constipation. Negative for abdominal pain, blood in stool and diarrhea.  Endocrine: Negative for increased urination.  Genitourinary: Negative for painful urination.  Musculoskeletal: Positive for arthralgias, joint pain, joint swelling, myalgias, muscle weakness, morning stiffness, muscle tenderness and myalgias.  Skin: Positive for rash. Negative for color change and redness.  Allergic/Immunologic: Negative for susceptible to infections.  Neurological: Positive for numbness, headaches and weakness. Negative for dizziness and memory loss.  Hematological: Negative for swollen glands.  Psychiatric/Behavioral: Positive for  confusion and sleep disturbance.    PMFS History:  Patient Active Problem List   Diagnosis Date Noted  . Arthralgia 02/24/2021  . Positive ANA (antinuclear antibody) 02/22/2021  . Iron deficiency 07/07/2020  . Acute midline low back pain without sciatica 01/14/2020  . Anemia 12/02/2019  . Lightheadedness 12/02/2019  . Menorrhagia 08/07/2019  . Abnormal uterine bleeding 08/07/2019  . Anxiety and depression 07/03/2018  . DKA (diabetic ketoacidoses) 03/28/2018  . Tachycardia 03/28/2018  . Urticaria 03/28/2018  . Hyperosmolar hyperglycemia 01/04/2018  . AKI (acute kidney injury) ([facility]) 01/04/2018  . Hyperglycemia 01/03/2018  . 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-I)   . Asthma   . Type 2 diabetes mellitus with complication, with long-term current use of insulin ([facility])   . Elevated cholesterol   . Essential hypertension     Past Medical History:  Diagnosis Date  . Allergy   . 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-I)   . Anxiety   . Asthma   . Diabetes mellitus    type II  . Hyperlipidemia   . Hypertension   . Urticaria, chronic   . UTI (lower urinary tract infection)     Family History  Problem Relation Age of Onset  . Hyperlipidemia Other   . Heart disease Other   . Diabetes Father   . Hypertension Father   . COPD Father        Cause of death  . Colon cancer Mother   . Hypertension Mother   . Hypertension Sister   . Diabetes Sister   . Hypertension Brother   . Cancer Sister  Brain glioblastoma  . Diabetes Sister    Past Surgical History:  Procedure Laterality Date  . ABDOMINAL HYSTERECTOMY  08/07/2019  . breast lift  1993  . CATARACT EXTRACTION, BILATERAL Bilateral 2014  . LAPAROSCOPIC VAGINAL HYSTERECTOMY WITH SALPINGO OOPHORECTOMY Bilateral 08/07/2019   Procedure: LAPAROSCOPIC ASSISTED VAGINAL HYSTERECTOMY WITH SALPINGO OOPHORECTOMY, possible abdominal hysterectomy;  Surgeon: Everlene Farrier, MD;   Location: [facility];  Service: Gynecology;  Laterality: Bilateral;  possible abdominal hysterectomy pt is diabetic and hypertensive  . SOFT TISSUE MASS EXCISION Right 2016   [place]  . TUBAL LIGATION  1995  . tummy tuck  1993   Social History   Social History Narrative  . Not on file   Immunization History  Administered Date(s) Administered  . DTaP 07/04/2009  . Influenza,inj,Quad PF,6+ Mos 01/05/2018, 08/28/2018, 09/05/2019, 10/05/2020  . PFIZER(Purple Top)SARS-COV-2 Vaccination 02/03/2020, 03/02/2020  . Pneumococcal Polysaccharide-23 01/05/2018  . Tdap 12/04/2012     Objective: Vital Signs: BP (!) 146/82 (BP Location: Left Arm, Patient Position: Sitting, Cuff Size: Normal)   Pulse (!) 102   Ht _0  (1.6 m)   Wt 212 lb 9.6 oz (96.4 kg)   LMP 05/13/2019 Comment: patient had not had her period in a year and it came back in May and June 2020  BMI 37.66 kg/m    Physical Exam Constitutional:      Appearance: She is obese.  Cardiovascular:     Rate and Rhythm: Regular rhythm. Tachycardia present.  Skin:    General: Skin is warm and dry.     Findings: No rash.  Neurological:     General: No focal deficit present.     Mental Status: She is alert.     Musculoskeletal Exam:  Neck full ROM no tenderness Shoulders left shoulder pain with forward extension and with abduction Elbows full ROM no tenderness or swelling Wrists full ROM no tenderness or swelling  Fingers left hand MCPs tenderness without palpable synovitis no discoloration or warmth   Investigation: No additional findings.  Imaging: No results found.  Recent Labs: Lab Results  Component Value Date   WBC 7.8 01/12/2021   HGB 13.1 01/12/2021   PLT 335.0 01/12/2021   NA 137 02/03/2021   K 4.2 02/03/2021   CL 97 02/03/2021   CO2 31 02/03/2021   GLUCOSE 321 (H) 02/03/2021   BUN 17 02/03/2021   CREATININE 0.64 02/03/2021   BILITOT 0.5 02/03/2021   ALKPHOS 74 02/03/2021   AST 18 02/03/2021   ALT 23  02/03/2021   PROT 7.7 02/03/2021   ALBUMIN 4.0 02/03/2021   CALCIUM 10.0 02/03/2021   GFRAA >60 07/30/2019    Speciality Comments: No specialty comments available.  Procedures:  No procedures performed Allergies: Ozempic (0.25 or 0.5 mg-dose) [semaglutide(0.25 or 0.44m-dos)], Actos [pioglitazone hydrochloride], Lisinopril, and Metformin and related   Assessment / Plan:     Visit Diagnoses: Positive ANA (antinuclear antibody) - Plan: Urinalysis, Routine w reflex microscopic, hydroxychloroquine (PLAQUENIL) 200 MG tablet  Positive ANA without clear clinical criteria for SLE although dsDNA is fairly specific Ab titer and with joint pain and rashes would recommend trial of treatment as an undifferentiated connective tissue disease. Also checking urinalysis screening for nephritis or proteinuria with dsDNA positive. Discussed risks of HCQ use including GI symptoms, sensitivity rash, arrhythmia, and need for baseline and monitoring retinal examination.  Arthralgia, unspecified joint - Plan: Urinalysis, Routine w reflex microscopic, hydroxychloroquine (PLAQUENIL) 200 MG tablet   Left shoulder pain seems to have some  impingement syndrome features. Hand tenderness without sensory change or weakness. Will reassess if improving with DMARD trial.  Orders: Orders Placed This Encounter  Procedures  . Urinalysis, Routine w reflex microscopic   Meds ordered this encounter  Medications  . hydroxychloroquine (PLAQUENIL) 200 MG tablet    Sig: Take 2 tablets (400 mg total) by mouth daily.    Dispense:  60 tablet    Refill:  1    Follow-Up Instructions: Return in about 8 weeks (around 05/03/2021) for SLE chronic urticaria HCQ start f/u.   Collier Salina, MD  Note - This record has been created using [employer].  Chart creation errors have been sought, but may not always  have been located. Such creation errors do not reflect on  the standard of medical care.

## 2021-03-08 NOTE — Patient Instructions (Signed)
Hydroxychloroquine tablets What is this medicine? HYDROXYCHLOROQUINE (hye drox ee KLOR oh kwin) is used to treat rheumatoid arthritis and systemic lupus erythematosus. It is also used to treat malaria. This medicine may be used for other purposes; ask your health care provider or pharmacist if you have questions. COMMON BRAND NAME(S): Plaquenil, Quineprox What should I tell my health care provider before I take this medicine? They need to know if you have any of these conditions:  diabetes  eye disease, vision problems  G6PD deficiency  heart disease  history of irregular heartbeat  if you often drink alcohol  kidney disease  liver disease  porphyria  psoriasis  an unusual or allergic reaction to chloroquine, hydroxychloroquine, other medicines, foods, dyes, or preservatives  pregnant or trying to get pregnant  breast-feeding How should I use this medicine? Take this medicine by mouth with a glass of water. Take it as directed on the prescription label. Do not cut, crush or chew this medicine. Swallow the tablets whole. Take it with food. Do not take it more than directed. Take all of this medicine unless your health care provider tells you to stop it early. Keep taking it even if you think you are better. Take products with antacids in them at a different time of day than this medicine. Take this medicine 4 hours before or 4 hours after antacids. Talk to your health care provider if you have questions. Talk to your pediatrician regarding the use of this medicine in children. While this drug may be prescribed for selected conditions, precautions do apply. Overdosage: If you think you have taken too much of this medicine contact a poison control center or emergency room at once. NOTE: This medicine is only for you. Do not share this medicine with others. What if I miss a dose? If you miss a dose, take it as soon as you can. If it is almost time for your next dose, take only  that dose. Do not take double or extra doses. What may interact with this medicine? Do not take this medicine with any of the following medications:  cisapride  dronedarone  pimozide  thioridazine This medicine may also interact with the following medications:  ampicillin  antacids  cimetidine  cyclosporine  digoxin  kaolin  medicines for diabetes, like insulin, glipizide, glyburide  medicines for seizures like carbamazepine, phenobarbital, phenytoin  mefloquine  methotrexate  other medicines that prolong the QT interval (cause an abnormal heart rhythm)  praziquantel This list may not describe all possible interactions. Give your health care provider a list of all the medicines, herbs, non-prescription drugs, or dietary supplements you use. Also tell them if you smoke, drink alcohol, or use illegal drugs. Some items may interact with your medicine. What should I watch for while using this medicine? Visit your health care provider for regular checks on your progress. Tell your health care provider if your symptoms do not start to get better or if they get worse. You may need blood work done while you are taking this medicine. If you take other medicines that can affect heart rhythm, you may need more testing. Talk to your health care provider if you have questions. Your vision may be tested before and during use of this medicine. Tell your health care provider right away if you have any change in your eyesight. This medicine may cause serious skin reactions. They can happen weeks to months after starting the medicine. Contact your health care provider right away if  you notice fevers or flu-like symptoms with a rash. The rash may be red or purple and then turn into blisters or peeling of the skin. Or, you might notice a red rash with swelling of the face, lips or lymph nodes in your neck or under your arms. If you or your family notice any changes in your behavior, such as new  or worsening depression, thoughts of harming yourself, anxiety, or other unusual or disturbing thoughts, or memory loss, call your health care provider right away. What side effects may I notice from receiving this medicine? Side effects that you should report to your doctor or health care professional as soon as possible:  allergic reactions (skin rash, itching or hives; swelling of the face, lips, or tongue)  changes in vision  decreased hearing, ringing in the ears  heartbeat rhythm changes (trouble breathing; chest pain; dizziness; fast, irregular heartbeat; feeling faint or lightheaded, falls)  liver injury (dark yellow or brown urine; general ill feeling or flu-like symptoms; loss of appetite, right upper belly pain; unusually weak or tired, yellowing of the eyes or skin)  low blood sugar (feeling anxious; confusion; dizziness; increased hunger; unusually weak or tired; increased sweating; shakiness; cold, clammy skin; irritable; headache; blurred vision; fast heartbeat; loss of consciousness)  low red blood cell counts (trouble breathing; feeling faint; lightheaded, falls; unusually weak or tired)  muscle weakness  pain, tingling, numbness in the hands or feet  rash, fever, and swollen lymph nodes  redness, blistering, peeling or loosening of the skin, including inside the mouth  suicidal thoughts, mood changes  uncontrollable head, mouth, neck, arm, or leg movements  unusual bruising or bleeding Side effects that usually do not require medical attention (report to your doctor or health care professional if they continue or are bothersome):  diarrhea  hair loss  irritable This list may not describe all possible side effects. Call your doctor for medical advice about side effects. You may report side effects to FDA at 1-800-FDA-1088. Where should I keep my medicine? Keep out of the reach of children and pets. Store at room temperature up to 30 degrees C (86 degrees F).  Protect from light. Get rid of any unused medicine after the expiration date. To get rid of medicines that are no longer needed or have expired:  Take the medicine to a medicine take-back program. Check with your pharmacy or law enforcement to find a location.  If you cannot return the medicine, check the label or package insert to see if the medicine should be thrown out in the garbage or flushed down the toilet. If you are not sure, ask your health care provider. If it is safe to put it in the trash, empty the medicine out of the container. Mix the medicine with cat litter, dirt, coffee grounds, or other unwanted substance. Seal the mixture in a bag or container. Put it in the trash. NOTE: This sheet is a summary. It may not cover all possible information. If you have questions about this medicine, talk to your doctor, pharmacist, or health care provider.  2021 Elsevier/Gold Standard (2020-05-10 15:07:49)

## 2021-03-09 LAB — URINALYSIS, ROUTINE W REFLEX MICROSCOPIC
Bilirubin Urine: NEGATIVE
Hgb urine dipstick: NEGATIVE
Ketones, ur: NEGATIVE
Leukocytes,Ua: NEGATIVE
Nitrite: NEGATIVE
Protein, ur: NEGATIVE
Specific Gravity, Urine: 1.036 — ABNORMAL HIGH (ref 1.001–1.03)
pH: 6 (ref 5.0–8.0)

## 2021-03-09 MED ORDER — HYDROXYCHLOROQUINE SULFATE 200 MG PO TABS: 400.0000 mg | ORAL_TABLET | Freq: Every day | ORAL | 1 refills | Status: DC

## 2021-03-09 NOTE — Progress Notes (Signed)
Urine sample shows glucose this indicates blood sugar is high but there is no protein or blood which would be the signs of an inflammatory problem affecting the kidney.

## 2021-03-16 ENCOUNTER — Other Ambulatory Visit: Payer: 59

## 2021-03-22 ENCOUNTER — Ambulatory Visit: Payer: 59 | Admitting: Endocrinology

## 2021-04-14 ENCOUNTER — Other Ambulatory Visit: Payer: Self-pay

## 2021-04-14 ENCOUNTER — Other Ambulatory Visit (INDEPENDENT_AMBULATORY_CARE_PROVIDER_SITE_OTHER): Payer: 59

## 2021-04-14 DIAGNOSIS — E1165 Type 2 diabetes mellitus with hyperglycemia: Secondary | ICD-10-CM

## 2021-04-14 DIAGNOSIS — Z794 Long term (current) use of insulin: Secondary | ICD-10-CM | POA: Diagnosis not present

## 2021-04-14 LAB — BASIC METABOLIC PANEL
BUN: 22 mg/dL (ref 6–23)
CO2: 31 mEq/L (ref 19–32)
Calcium: 9.7 mg/dL (ref 8.4–10.5)
Chloride: 104 mEq/L (ref 96–112)
Creatinine, Ser: 0.59 mg/dL (ref 0.40–1.20)
GFR: 102.29 mL/min (ref >60.00)
Glucose, Bld: 139 mg/dL — ABNORMAL HIGH (ref 70–99)
Potassium: 3.8 mEq/L (ref 3.5–5.1)
Sodium: 141 mEq/L (ref 135–145)

## 2021-04-15 LAB — FRUCTOSAMINE: Fructosamine: 307 umol/L — ABNORMAL HIGH (ref 0–285)

## 2021-04-18 ENCOUNTER — Encounter: Payer: Self-pay | Admitting: Endocrinology

## 2021-04-18 ENCOUNTER — Other Ambulatory Visit: Payer: Self-pay

## 2021-04-18 ENCOUNTER — Ambulatory Visit: Payer: 59 | Admitting: Endocrinology

## 2021-04-18 VITALS — BP 130/84 | HR 98 | Ht 63.0 in | Wt 211.2 lb

## 2021-04-18 DIAGNOSIS — Z794 Long term (current) use of insulin: Secondary | ICD-10-CM

## 2021-04-18 DIAGNOSIS — I10 Essential (primary) hypertension: Secondary | ICD-10-CM

## 2021-04-18 DIAGNOSIS — E782 Mixed hyperlipidemia: Secondary | ICD-10-CM

## 2021-04-18 DIAGNOSIS — E1165 Type 2 diabetes mellitus with hyperglycemia: Secondary | ICD-10-CM | POA: Diagnosis not present

## 2021-04-18 MED ORDER — HUMULIN R U-500 KWIKPEN 500 UNIT/ML ~~LOC~~ SOPN: PEN_INJECTOR | SUBCUTANEOUS | 1 refills | Status: DC

## 2021-04-18 NOTE — Progress Notes (Signed)
Patient ID: Holly Ortiz, female   DOB: 28-Jan-1966, 55 y.o.   MRN: [id number]          Reason for Appointment: Follow-up for Type 2 Diabetes   History of Present Illness:          Date of diagnosis of type 2 diabetes mellitus:   At age 74      Background history:  Her diabetes was diagnosed incidentally when she was having unrelated problems She took various oral agents including metformin for several years About 5 years ago she was started on insulin and she apparently was taking only one kind of insulin possibly a premixed insulin twice a day She said that her blood sugars have been usually poorly controlled with A1c usually 8-9% She was also taking metformin, Amaryl and Januvia prior to moving here from [place]  Recent history:    INSULIN regimen is: Humulin U-500, 45-40-50-60 units 4 times a day  Non-insulin hypoglycemic drugs the patient is taking are: Farxiga 10 mg daily   Current management, blood sugar patterns and problems identified:   Her A1c most recently was 11.2 compared to 9.8  Fructosamine is 307, previously 314   She did not bring her monitor for review but is keeping check on her blood sugar 2-3 times a day usually on her [employer] meter  She has blood sugar readings that indicate the following  FASTING blood sugars are high about half the time with highest reading 203 but as low as 94  Lunchtime blood sugars are not checked much but not consistently high  Also at dinnertime her blood sugars are high about half the time highest 200  Blood sugars are ranging from 160-224, 2 to 3 hours after dinner  She was told to increase her insulin before meals and reduce it by at least 5 units at bedtime but has only made mild increase  Also her mealtime insulin is being taken about 10 to 15-minute before eating rather than 30 minutes  No overnight HYPOGLYCEMIA reported  Her weight is about the same  She was told to look into the V-go pump but she has not  been able to get the information about her co-pay  Also unable to verify coverage for any brand-name meter or CGM  [place] does not cause side effects and she is taking it regularly   She is only recently able to start a little walking, previously having musculoskeletal pain  Not on any steroids  She does not want to see the dietitian despite having difficulty with weight loss and not always control her diet  Dinner at 6 pm          Side effects from medications have been: Vomiting from Ozempic, hives from [place]; abdominal swelling/weight gain with Lantus and Basaglar   Glucose monitoring:  done 1-2 times a day         Glucometer: [employer] brand       Blood Glucose readings from her meter    PRE-MEAL Fasting Lunch Dinner Bedtime Overall  Glucose range:  94-203   93-200  160-224   Mean/median:        Previous readings:  PRE-MEAL Fasting Lunch Dinner Bedtime Overall  Glucose range:  180-200 ?:  200  280   Mean/median:          Dietician visit, most recent: A few years ago in [place]  Weight history:  Wt Readings from Last 3 Encounters:  04/18/21 211 lb 3.2 oz (95.8 kg)  03/08/21 212 lb 9.6 oz (96.4 kg)  02/22/21 212 lb 3.2 oz (96.3 kg)    Glycemic control:   Lab Results  Component Value Date   HGBA1C 11.2 (H) 02/03/2021   HGBA1C 9.8 (H) 08/05/2020   HGBA1C 11.2 (H) 07/07/2020   Lab Results  Component Value Date   MICROALBUR <0.7 02/03/2021   LDLCALC 71 07/21/2019   CREATININE 0.59 04/14/2021   Lab Results  Component Value Date   MICRALBCREAT 2.3 02/03/2021    Lab Results  Component Value Date   FRUCTOSAMINE 307 (H) 04/14/2021   FRUCTOSAMINE 314 (H) 09/30/2020   FRUCTOSAMINE 408 (H) 04/09/2020    Lab on 04/14/2021  Component Date Value Ref Range Status  . Fructosamine 04/14/2021 307* 0 - 285 umol/L Final   Comment: Published reference interval for apparently healthy subjects between age 20 and 44 is 58 - 285 umol/L and in a  poorly controlled diabetic population is 228 - 563 umol/L with a mean of 396 umol/L.   Marland Kitchen Sodium 04/14/2021 141  135 - 145 mEq/L Final  . Potassium 04/14/2021 3.8  3.5 - 5.1 mEq/L Final  . Chloride 04/14/2021 104  96 - 112 mEq/L Final  . CO2 04/14/2021 31  19 - 32 mEq/L Final  . Glucose, Bld 04/14/2021 139* 70 - 99 mg/dL Final  . BUN 04/14/2021 22  6 - 23 mg/dL Final  . Creatinine, Ser 04/14/2021 0.59  0.40 - 1.20 mg/dL Final  . GFR 04/14/2021 102.29  >60.00 mL/min Final   Calculated using the CKD-EPI Creatinine Equation (2021)  . Calcium 04/14/2021 9.7  8.4 - 10.5 mg/dL Final    Allergies as of 04/18/2021      Reactions   Ozempic (0.25 Or 0.5 Mg-dose) [semaglutide(0.25 Or 0.5mg -dos)] Nausea And Vomiting   Actos [pioglitazone Hydrochloride] Other (See Comments)   Wt gain   Lisinopril    Angioedema, including likely GI involvement   Metformin And Related Diarrhea, Nausea And Vomiting      Medication List       Accurate as of Apr 18, 2021  8:59 PM. If you have any questions, ask your nurse or doctor.        acetaminophen 500 MG tablet Commonly known as: TYLENOL Take 2 tablets (1,000 mg total) by mouth every 6 (six) hours as needed.   amLODipine 10 MG tablet Commonly known as: NORVASC Take 1 tablet by mouth once daily   atorvastatin 40 MG tablet Commonly known as: LIPITOR Take 1 tablet (40 mg total) by mouth daily.   BD Pen Needle Nano 2nd Gen 32G X 4 MM Misc Generic drug: Insulin Pen Needle Use four times daily to inject insulin.   dapagliflozin propanediol 10 MG Tabs tablet Commonly known as: Farxiga Take 1 tablet (10 mg total) by mouth daily.   escitalopram 20 MG tablet Commonly known as: LEXAPRO Take 1 tablet (20 mg total) by mouth daily. **Needs appt before next refill**   fenofibrate 145 MG tablet Commonly known as: Tricor Take 1 tablet (145 mg total) by mouth daily.   FreeStyle Libre 2 Sensor Misc 2 Devices by Does not apply route every 14 (fourteen)  days.   gabapentin 100 MG capsule Commonly known as: NEURONTIN Take 1 capsule (100 mg total) by mouth at bedtime for 7 days, THEN 1 capsule (100 mg total) 2 (two) times daily for 7 days, THEN 1 capsule (100 mg total) 3 (three) times daily for 16 days. Start taking on: January 15, 2021 What changed: See  the new instructions.   HumuLIN R U-500 KwikPen 500 UNIT/ML kwikpen Generic drug: insulin regular human CONCENTRATED INJECT 55 units before meals AND 60 UNITS AT BEDTIME, adjust as directed. What changed: additional instructions Changed by: Elayne Snare, MD   hydrochlorothiazide 12.5 MG tablet Commonly known as: HYDRODIURIL Take 1 tablet by mouth once daily   hydroxychloroquine 200 MG tablet Commonly known as: PLAQUENIL Take 2 tablets (400 mg total) by mouth daily.   Iron (Ferrous Sulfate) 325 (65 Fe) MG Tabs Take one tablet daily.   melatonin 5 MG Tabs Take 5 mg by mouth at bedtime.   montelukast 10 MG tablet Commonly known as: SINGULAIR TAKE 1 TABLET BY MOUTH AT BEDTIME   ondansetron 4 MG tablet Commonly known as: Zofran Take 1 tablet (4 mg total) by mouth every 8 (eight) hours as needed for nausea or vomiting.   vitamin B-12 1000 MCG tablet Commonly known as: CYANOCOBALAMIN Take 1,000 mcg by mouth daily.   Vitamin D 50 MCG (2000 UT) Caps Take 2,000 Units by mouth daily.   zinc gluconate 50 MG tablet Take 50 mg by mouth daily.       Allergies:  Allergies  Allergen Reactions  . Ozempic (0.25 Or 0.5 Mg-Dose) [Semaglutide(0.25 Or 0.5mg -Dos)] Nausea And Vomiting  . Actos [Pioglitazone Hydrochloride] Other (See Comments)    Wt gain  . Lisinopril     Angioedema, including likely GI involvement  . Metformin And Related Diarrhea and Nausea And Vomiting    Past Medical History:  Diagnosis Date  . Allergy   . 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-I)   . Anxiety   . Asthma   . Diabetes mellitus    type II  . Hyperlipidemia   .  Hypertension   . Urticaria, chronic   . UTI (lower urinary tract infection)     Past Surgical History:  Procedure Laterality Date  . ABDOMINAL HYSTERECTOMY  08/07/2019  . breast lift  1993  . CATARACT EXTRACTION, BILATERAL Bilateral 2014  . LAPAROSCOPIC VAGINAL HYSTERECTOMY WITH SALPINGO OOPHORECTOMY Bilateral 08/07/2019   Procedure: LAPAROSCOPIC ASSISTED VAGINAL HYSTERECTOMY WITH SALPINGO OOPHORECTOMY, possible abdominal hysterectomy;  Surgeon: Everlene Farrier, MD;  Location: [facility];  Service: Gynecology;  Laterality: Bilateral;  possible abdominal hysterectomy pt is diabetic and hypertensive  . SOFT TISSUE MASS EXCISION Right 2016   [place] area  . TUBAL LIGATION  1995  . tummy tuck  1993    Family History  Problem Relation Age of Onset  . Hyperlipidemia Other   . Heart disease Other   . Diabetes Father   . Hypertension Father   . COPD Father        Cause of death  . Colon cancer Mother   . Hypertension Mother   . Hypertension Sister   . Diabetes Sister   . Hypertension Brother   . Cancer Sister        Brain glioblastoma  . Diabetes Sister     Social History:  reports that she has never smoked. She has never used smokeless tobacco. She reports that she does not drink alcohol and does not use drugs.   Review of Systems   Lipid history: On Lipitor 40 mg from her PCP   Has started fenofibrate also and lipids are pending    Lab Results  Component Value Date   CHOL 172 02/03/2021   HDL 30.90 (L) 02/03/2021   LDLCALC 71 07/21/2019   LDLDIRECT 68.0 02/03/2021   TRIG (H) 02/03/2021  511.0 Triglyceride is over 400; calculations on Lipids are invalid.   CHOLHDL 6 02/03/2021           Hypertension: Has been present since age 64, on amlodipine and HCTZ 12.5mg  prescribed by her PCP  May have had angioedema with lisinopril   BP Readings from Last 3 Encounters:  04/18/21 130/84  03/08/21 (!) 146/82  02/22/21 (!) 147/87    Most recent eye exam was in 2017  Most  recent foot exam: 9/19  Currently known complications of diabetes: None  LABS:  Lab on 04/14/2021  Component Date Value Ref Range Status  . Fructosamine 04/14/2021 307* 0 - 285 umol/L Final   Comment: Published reference interval for apparently healthy subjects between age 78 and 70 is 86 - 285 umol/L and in a poorly controlled diabetic population is 228 - 563 umol/L with a mean of 396 umol/L.   Marland Kitchen Sodium 04/14/2021 141  135 - 145 mEq/L Final  . Potassium 04/14/2021 3.8  3.5 - 5.1 mEq/L Final  . Chloride 04/14/2021 104  96 - 112 mEq/L Final  . CO2 04/14/2021 31  19 - 32 mEq/L Final  . Glucose, Bld 04/14/2021 139* 70 - 99 mg/dL Final  . BUN 04/14/2021 22  6 - 23 mg/dL Final  . Creatinine, Ser 04/14/2021 0.59  0.40 - 1.20 mg/dL Final  . GFR 04/14/2021 102.29  >60.00 mL/min Final   Calculated using the CKD-EPI Creatinine Equation (2021)  . Calcium 04/14/2021 9.7  8.4 - 10.5 mg/dL Final    Physical Examination:  BP 130/84   Pulse 98   Ht 5\' 3"  (1.6 m)   Wt 211 lb 3.2 oz (95.8 kg)   LMP 05/13/2019 Comment: patient had not had her period in a year and it came back in May and June 2020  SpO2 94%   BMI 37.41 kg/m       ASSESSMENT:  Diabetes type 2, with history of persistent hyperglycemia, insulin-dependent  See history of present illness for detailed discussion of current diabetes management, blood sugar patterns and problems identified  A1c is last 11.2 Fructosamine of 307 indicates relatively better control and her home readings are high only about half the time Currently on about 200 units of U-500 insulin along with [place]  She is insulin resistant  Discussed that she needs to likely take more mealtime insulin with larger meals as well as at least 5 more units to cover her evening meal She will as a rule try to take her insulin 30 minutes before eating To reduce bedtime dose by 5 units to avoid overnight hypoglycemia  Hypertension: Blood pressure is fairly well  controlled  LIPIDS: Labs pending, now on fenofibrate without side effects   PLAN:   Check to see if brand-name blood sugar monitoring is covered from insurance Also she will check on her co-pay for the V-go pump Insulin doses will be 45 before breakfast, 40 before lunch, 55 before dinner and at bedtime Inject 30-minute before eating   Patient Instructions  Insulin 30 min before meals  55 at dinner and bedtime  Check [place] on V-go; also check website for coupon  [employer] or [facility] sensor    Elayne Snare 04/18/2021, 8:59 PM   Note: This office note was prepared with Dragon voice recognition system technology. Any transcriptional errors that result from this process are unintentional.

## 2021-04-18 NOTE — Patient Instructions (Addendum)
Insulin 30 min before meals  55 at dinner and bedtime  Check Copay on V-go; also check website for coupon  [facility] or [place] sensor

## 2021-04-19 ENCOUNTER — Other Ambulatory Visit: Payer: Self-pay | Admitting: Family Medicine

## 2021-04-19 ENCOUNTER — Other Ambulatory Visit: Payer: Self-pay

## 2021-04-19 DIAGNOSIS — L509 Urticaria, unspecified: Secondary | ICD-10-CM

## 2021-04-19 DIAGNOSIS — F419 Anxiety disorder, unspecified: Secondary | ICD-10-CM

## 2021-04-19 DIAGNOSIS — F32A Depression, unspecified: Secondary | ICD-10-CM

## 2021-04-19 DIAGNOSIS — I10 Essential (primary) hypertension: Secondary | ICD-10-CM

## 2021-04-19 MED ORDER — ESCITALOPRAM OXALATE 20 MG PO TABS: 20.0000 mg | ORAL_TABLET | Freq: Every day | ORAL | 0 refills | Status: DC

## 2021-04-19 MED ORDER — MONTELUKAST SODIUM 10 MG PO TABS: 1.0000 | ORAL_TABLET | Freq: Every day | ORAL | 0 refills | Status: DC

## 2021-04-19 MED ORDER — HYDROCHLOROTHIAZIDE 12.5 MG PO TABS: 12.5000 mg | ORAL_TABLET | Freq: Every day | ORAL | 0 refills | Status: DC

## 2021-04-19 MED ORDER — AMLODIPINE BESYLATE 10 MG PO TABS: 1.0000 | ORAL_TABLET | Freq: Every day | ORAL | 0 refills | Status: DC

## 2021-04-19 NOTE — Telephone Encounter (Signed)
Sent message for patient to give [place] a call to schedule appointment for follow up.

## 2021-04-19 NOTE — Telephone Encounter (Signed)
Please update pharmacy to [facility] on [address], [place].  Refills needed: Amlodipine, Lexapro, Hydrochlorothiazide, Singulair.  Please call patient at [phone] when they have been sent in.

## 2021-04-19 NOTE — Telephone Encounter (Signed)
Refill request for pending Rx last OV with [facility] February 2022 last OV wth Dr. Ethelene Hal August 2021. Please advise

## 2021-04-20 ENCOUNTER — Other Ambulatory Visit: Payer: Self-pay | Admitting: Family Medicine

## 2021-04-20 DIAGNOSIS — F419 Anxiety disorder, unspecified: Secondary | ICD-10-CM

## 2021-04-20 DIAGNOSIS — I10 Essential (primary) hypertension: Secondary | ICD-10-CM

## 2021-04-20 DIAGNOSIS — F32A Depression, unspecified: Secondary | ICD-10-CM

## 2021-04-20 DIAGNOSIS — L509 Urticaria, unspecified: Secondary | ICD-10-CM

## 2021-04-22 ENCOUNTER — Other Ambulatory Visit: Payer: Self-pay

## 2021-04-24 MED ORDER — DAPAGLIFLOZIN PROPANEDIOL 10 MG PO TABS: 10.0000 mg | ORAL_TABLET | Freq: Every day | ORAL | 0 refills | Status: DC

## 2021-05-02 NOTE — Progress Notes (Signed)
Office Visit Note  Patient: Holly Ortiz             Date of Birth: 11/27/1966           MRN: [id number]             PCP: Libby Maw, MD Referring: Libby Maw,* Visit Date: 05/03/2021   Subjective:  Follow-up (Patient feels as if symptoms are well-controlled on PLQ. Patient complains of low back and left knee pain. Patient is scheduled for PLQ eye exam in June. )   History of Present Illness: Holly Ortiz is a 55 y.o. female here for follow up for probable lupus arthritis and chronic urticarial rashes after starting hydroxychloroquine 400 mg p.o. daily since 2 months ago.  The skin symptoms have significantly improved.  She does continue experiencing low back pain and left knee pain that have not responded in any meaningful way to the medication.    Review of Systems  Constitutional: Positive for fatigue.  HENT: Negative for mouth sores, mouth dryness and nose dryness.   Eyes: Negative for pain, itching, visual disturbance and dryness.  Respiratory: Positive for shortness of breath and difficulty breathing. Negative for cough and hemoptysis.   Cardiovascular: Positive for chest pain and swelling in legs/feet. Negative for palpitations.  Gastrointestinal: Positive for constipation and diarrhea. Negative for abdominal pain and blood in stool.  Endocrine: Negative for increased urination.  Genitourinary: Negative for painful urination.  Musculoskeletal: Positive for arthralgias, joint pain, myalgias, morning stiffness and myalgias. Negative for joint swelling, muscle weakness and muscle tenderness.  Skin: Negative for color change, rash and redness.  Allergic/Immunologic: Negative for susceptible to infections.  Neurological: Positive for dizziness, numbness, headaches and weakness. Negative for memory loss.  Hematological: Negative for swollen glands.  Psychiatric/Behavioral: Positive for sleep disturbance. Negative for confusion.    PMFS History:   Patient Active Problem List   Diagnosis Date Noted  . Pain in left knee 05/03/2021  . Arthralgia 02/24/2021  . Positive ANA (antinuclear antibody) 02/22/2021  . Iron deficiency 07/07/2020  . Left low back pain 01/14/2020  . Anemia 12/02/2019  . Lightheadedness 12/02/2019  . Menorrhagia 08/07/2019  . Abnormal uterine bleeding 08/07/2019  . Anxiety and depression 07/03/2018  . DKA (diabetic ketoacidoses) 03/28/2018  . Tachycardia 03/28/2018  . Urticaria 03/28/2018  . Hyperosmolar hyperglycemia 01/04/2018  . AKI (acute kidney injury) ([place]) 01/04/2018  . Hyperglycemia 01/03/2018  . 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-I)   . Asthma   . Type 2 diabetes mellitus with complication, with long-term current use of insulin ([place])   . Elevated cholesterol   . Essential hypertension     Past Medical History:  Diagnosis Date  . Allergy   . 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-I)   . Anxiety   . Asthma   . Diabetes mellitus    type II  . Hyperlipidemia   . Hypertension   . Urticaria, chronic   . UTI (lower urinary tract infection)     Family History  Problem Relation Age of Onset  . Hyperlipidemia Other   . Heart disease Other   . Diabetes Father   . Hypertension Father   . COPD Father        Cause of death  . Colon cancer Mother   . Hypertension Mother   . Hypertension Sister   . Diabetes Sister   . Hypertension Brother   . Cancer Sister  Brain glioblastoma  . Diabetes Sister    Past Surgical History:  Procedure Laterality Date  . ABDOMINAL HYSTERECTOMY  08/07/2019  . breast lift  1993  . CATARACT EXTRACTION, BILATERAL Bilateral 2014  . LAPAROSCOPIC VAGINAL HYSTERECTOMY WITH SALPINGO OOPHORECTOMY Bilateral 08/07/2019   Procedure: LAPAROSCOPIC ASSISTED VAGINAL HYSTERECTOMY WITH SALPINGO OOPHORECTOMY, possible abdominal hysterectomy;  Surgeon: Everlene Farrier, MD;  Location: [facility];  Service: Gynecology;  Laterality:  Bilateral;  possible abdominal hysterectomy pt is diabetic and hypertensive  . SOFT TISSUE MASS EXCISION Right 2016   [place] area  . TUBAL LIGATION  1995  . tummy tuck  1993   Social History   Social History Narrative  . Not on file   Immunization History  Administered Date(s) Administered  . DTaP 07/04/2009  . Influenza,inj,Quad PF,6+ Mos 01/05/2018, 08/28/2018, 09/05/2019, 10/05/2020  . PFIZER(Purple Top)SARS-COV-2 Vaccination 02/03/2020, 03/02/2020  . Pneumococcal Polysaccharide-23 01/05/2018  . Tdap 12/04/2012     Objective: Vital Signs: BP (!) 152/83 (BP Location: Left Arm, Patient Position: Sitting, Cuff Size: Normal)   Pulse 96   Ht 5\' 3"  (1.6 m)   Wt 216 lb 9.6 oz (98.2 kg)   LMP 05/13/2019 Comment: patient had not had her period in a year and it came back in May and June 2020  BMI 38.37 kg/m    Physical Exam Constitutional:      Appearance: She is obese.  HENT:     Right Ear: External ear normal.     Left Ear: External ear normal.  Eyes:     Conjunctiva/sclera: Conjunctivae normal.  Skin:    General: Skin is warm and dry.     Findings: No rash.  Neurological:     General: No focal deficit present.     Mental Status: She is alert.  Psychiatric:        Mood and Affect: Mood normal.     Musculoskeletal Exam:  Shoulders full ROM no tenderness or swelling Elbows full ROM no tenderness or swelling Wrists full ROM no tenderness or swelling Fingers full ROM no tenderness or swelling Some left lateral and posterior hip tenderness also localizes more distally and IT band distribution hip internal and external range of motion is normal Knees left knee some tenderness more than superiorly good range of motion no instability no swelling Ankles full ROM no tenderness or swelling    Investigation: No additional findings.  Imaging: No results found.  Recent Labs: Lab Results  Component Value Date   WBC 7.2 05/03/2021   HGB 12.6 05/03/2021   PLT 338  05/03/2021   NA 141 04/14/2021   K 3.8 04/14/2021   CL 104 04/14/2021   CO2 31 04/14/2021   GLUCOSE 139 (H) 04/14/2021   BUN 22 04/14/2021   CREATININE 0.59 04/14/2021   BILITOT 0.5 02/03/2021   ALKPHOS 74 02/03/2021   AST 18 02/03/2021   ALT 23 02/03/2021   PROT 7.7 02/03/2021   ALBUMIN 4.0 02/03/2021   CALCIUM 9.7 04/14/2021   GFRAA >60 07/30/2019    Speciality Comments: No specialty comments available.  Procedures:  No procedures performed Allergies: Ozempic (0.25 or 0.5 mg-dose) [semaglutide(0.25 or 0.5mg -dos)], Actos [pioglitazone hydrochloride], Lisinopril, and Metformin and related   Assessment / Plan:     Visit Diagnoses: Positive ANA (antinuclear antibody) - Plan: hydroxychloroquine (PLAQUENIL) 200 MG tablet, Anti-DNA antibody, double-stranded, C3 and C4, CBC with Differential/Platelet, Sedimentation rate, CANCELED: Sedimentation rate, CANCELED: Anti-DNA antibody, double-stranded, CANCELED: C3 and C4, CANCELED: CBC with Differential/Platelet  Appears to be  consistent with systemic lupus with manifestations of nonerosive arthritis and chronic urticarial rashes no evidence presently for organ system involvement.  We will continue hydroxychloroquine 400 mg daily.  Rechecking double-stranded DNA complements and sedimentation rate for monitoring disease activity.  No particular indications for increased immunosuppressive therapy at this time.  Urticaria Arthralgia, unspecified joint - Plan: hydroxychloroquine (PLAQUENIL) 200 MG tablet  Skin disease seems noticeably improved on hydroxychloroquine which is suggestive for cutaneous manifestation of lupus although in some cases idiopathic urticaria can be responsive and have association with arthralgias.  Chronic pain of left knee Chronic left-sided low back pain without sciatica - Plan: cyclobenzaprine (FLEXERIL) 5 MG tablet  Left knee and back pain seems more consistent with muscular and tendinous process based on described  symptoms and timing and benign examination findings.  Recommend trial of low-dose cyclobenzaprine as needed especially at night discussed caution monitoring for possible side effects such as drowsiness and CNS depression.    Orders: Orders Placed This Encounter  Procedures  . Anti-DNA antibody, double-stranded  . C3 and C4  . CBC with Differential/Platelet  . Sedimentation rate   Meds ordered this encounter  Medications  . cyclobenzaprine (FLEXERIL) 5 MG tablet    Sig: Take 1 tablet (5 mg total) by mouth 3 (three) times daily as needed for muscle spasms.    Dispense:  30 tablet    Refill:  2  . hydroxychloroquine (PLAQUENIL) 200 MG tablet    Sig: Take 2 tablets (400 mg total) by mouth daily.    Dispense:  180 tablet    Refill:  1     Follow-Up Instructions: Return in about 3 months (around 08/03/2021) for SLE and urticaria f/u on HCQ.   Collier Salina, MD  Note - This record has been created using [employer].  Chart creation errors have been sought, but may not always  have been located. Such creation errors do not reflect on  the standard of medical care.

## 2021-05-03 ENCOUNTER — Encounter: Payer: Self-pay | Admitting: Internal Medicine

## 2021-05-03 ENCOUNTER — Ambulatory Visit: Payer: 59 | Admitting: Internal Medicine

## 2021-05-03 ENCOUNTER — Other Ambulatory Visit: Payer: Self-pay

## 2021-05-03 VITALS — BP 152/83 | HR 96 | Ht 63.0 in | Wt 216.6 lb

## 2021-05-03 DIAGNOSIS — R768 Other specified abnormal immunological findings in serum: Secondary | ICD-10-CM

## 2021-05-03 DIAGNOSIS — M329 Systemic lupus erythematosus, unspecified: Secondary | ICD-10-CM | POA: Diagnosis not present

## 2021-05-03 DIAGNOSIS — M545 Low back pain, unspecified: Secondary | ICD-10-CM | POA: Diagnosis not present

## 2021-05-03 DIAGNOSIS — M25562 Pain in left knee: Secondary | ICD-10-CM | POA: Diagnosis not present

## 2021-05-03 DIAGNOSIS — L509 Urticaria, unspecified: Secondary | ICD-10-CM | POA: Diagnosis not present

## 2021-05-03 DIAGNOSIS — G8929 Other chronic pain: Secondary | ICD-10-CM

## 2021-05-03 DIAGNOSIS — M2550 Pain in unspecified joint: Secondary | ICD-10-CM

## 2021-05-03 MED ORDER — HYDROXYCHLOROQUINE SULFATE 200 MG PO TABS: 400.0000 mg | ORAL_TABLET | Freq: Every day | ORAL | 1 refills | Status: DC

## 2021-05-03 MED ORDER — CYCLOBENZAPRINE HCL 5 MG PO TABS: 5.0000 mg | ORAL_TABLET | Freq: Three times a day (TID) | ORAL | 2 refills | Status: DC | PRN

## 2021-05-03 NOTE — Patient Instructions (Signed)
Cyclobenzaprine tablets What is this medicine? CYCLOBENZAPRINE (sye kloe BEN za preen) is a muscle relaxer. It is used to treat muscle pain, spasms, and stiffness. This medicine may be used for other purposes; ask your health care provider or pharmacist if you have questions. COMMON BRAND NAME(S): Fexmid, Flexeril What should I tell my health care provider before I take this medicine? They need to know if you have any of these conditions:  heart disease, irregular heartbeat, or previous heart attack  liver disease  thyroid problem  an unusual or allergic reaction to cyclobenzaprine, tricyclic antidepressants, lactose, other medicines, foods, dyes, or preservatives  pregnant or trying to get pregnant  breast-feeding How should I use this medicine? Take this medicine by mouth with a glass of water. Follow the directions on the prescription label. If this medicine upsets your stomach, take it with food or milk. Take your medicine at regular intervals. Do not take it more often than directed. Talk to your pediatrician regarding the use of this medicine in children. Special care may be needed. Overdosage: If you think you have taken too much of this medicine contact a poison control center or emergency room at once. NOTE: This medicine is only for you. Do not share this medicine with others. What if I miss a dose? If you miss a dose, take it as soon as you can. If it is almost time for your next dose, take only that dose. Do not take double or extra doses. What may interact with this medicine? Do not take this medicine with any of the following medications:  MAOIs like Carbex, Eldepryl, Marplan, Nardil, and Parnate  narcotic medicines for cough  safinamide This medicine may also interact with the following medications:  alcohol  bupropion  antihistamines for allergy, cough and cold  certain medicines for anxiety or sleep  certain medicines for bladder problems like  oxybutynin, tolterodine  certain medicines for depression like amitriptyline, fluoxetine, sertraline  certain medicines for Parkinson's disease like benztropine, trihexyphenidyl  certain medicines for seizures like phenobarbital, primidone  certain medicines for stomach problems like dicyclomine, hyoscyamine  certain medicines for travel sickness like scopolamine  general anesthetics like halothane, isoflurane, methoxyflurane, propofol  ipratropium  local anesthetics like lidocaine, pramoxine, tetracaine  medicines that relax muscles for surgery  narcotic medicines for pain  phenothiazines like chlorpromazine, mesoridazine, prochlorperazine, thioridazine  verapamil This list may not describe all possible interactions. Give your health care provider a list of all the medicines, herbs, non-prescription drugs, or dietary supplements you use. Also tell them if you smoke, drink alcohol, or use illegal drugs. Some items may interact with your medicine. What should I watch for while using this medicine? Tell your doctor or health care professional if your symptoms do not start to get better or if they get worse. You may get drowsy or dizzy. Do not drive, use machinery, or do anything that needs mental alertness until you know how this medicine affects you. Do not stand or sit up quickly, especially if you are an older patient. This reduces the risk of dizzy or fainting spells. Alcohol may interfere with the effect of this medicine. Avoid alcoholic drinks. If you are taking another medicine that also causes drowsiness, you may have more side effects. Give your health care provider a list of all medicines you use. Your doctor will tell you how much medicine to take. Do not take more medicine than directed. Call emergency for help if you have problems breathing or unusual sleepiness.  Your mouth may get dry. Chewing sugarless gum or sucking hard candy, and drinking plenty of water may help.  Contact your doctor if the problem does not go away or is severe. What side effects may I notice from receiving this medicine? Side effects that you should report to your doctor or health care professional as soon as possible:  allergic reactions like skin rash, itching or hives, swelling of the face, lips, or tongue  breathing problems  chest pain  fast, irregular heartbeat  hallucinations  seizures  unusually weak or tired Side effects that usually do not require medical attention (report to your doctor or health care professional if they continue or are bothersome):  headache  nausea, vomiting This list may not describe all possible side effects. Call your doctor for medical advice about side effects. You may report side effects to FDA at 1-800-FDA-1088. Where should I keep my medicine? Keep out of the reach of children. Store at room temperature between 15 and 30 degrees C (59 and 86 degrees F). Keep container tightly closed. Throw away any unused medicine after the expiration date. NOTE: This sheet is a summary. It may not cover all possible information. If you have questions about this medicine, talk to your doctor, pharmacist, or health care provider.  2021 Elsevier/Gold Standard (2018-10-23 12:49:26)

## 2021-05-05 LAB — CBC WITH DIFFERENTIAL/PLATELET
Basophils Absolute: 0 10*3/uL (ref 0.0–0.2)
Basos: 1 %
EOS (ABSOLUTE): 0.2 10*3/uL (ref 0.0–0.4)
Eos: 3 %
Hematocrit: 39.4 % (ref 34.0–46.6)
Hemoglobin: 12.6 g/dL (ref 11.1–15.9)
Immature Grans (Abs): 0 10*3/uL (ref 0.0–0.1)
Immature Granulocytes: 0 %
Lymphocytes Absolute: 2.1 10*3/uL (ref 0.7–3.1)
Lymphs: 29 %
MCH: 26 pg — ABNORMAL LOW (ref 26.6–33.0)
MCHC: 32 g/dL (ref 31.5–35.7)
MCV: 81 fL (ref 79–97)
Monocytes Absolute: 0.7 10*3/uL (ref 0.1–0.9)
Monocytes: 9 %
Neutrophils Absolute: 4.2 10*3/uL (ref 1.4–7.0)
Neutrophils: 58 %
Platelets: 338 10*3/uL (ref 150–450)
RBC: 4.85 x10E6/uL (ref 3.77–5.28)
RDW: 13.4 % (ref 11.7–15.4)
WBC: 7.2 10*3/uL (ref 3.4–10.8)

## 2021-05-05 LAB — ANTI-DNA ANTIBODY, DOUBLE-STRANDED: dsDNA Ab: 40 IU/mL — ABNORMAL HIGH (ref 0–9)

## 2021-05-05 LAB — SEDIMENTATION RATE: Sed Rate: 26 mm/hr (ref 0–40)

## 2021-05-05 LAB — C3 AND C4
Complement C3, Serum: 178 mg/dL — ABNORMAL HIGH (ref 82–167)
Complement C4, Serum: 46 mg/dL — ABNORMAL HIGH (ref 12–38)

## 2021-05-11 LAB — LIPID PANEL
Chol/HDL Ratio: 3.6 ratio (ref 0.0–4.4)
Cholesterol, Total: 149 mg/dL (ref 100–199)
HDL: 41 mg/dL
LDL Chol Calc (NIH): 76 mg/dL (ref 0–99)
Triglycerides: 191 mg/dL — ABNORMAL HIGH (ref 0–149)
VLDL Cholesterol Cal: 32 mg/dL (ref 5–40)

## 2021-05-11 LAB — SPECIMEN STATUS REPORT

## 2021-05-12 ENCOUNTER — Other Ambulatory Visit: Payer: Self-pay | Admitting: Family Medicine

## 2021-05-12 DIAGNOSIS — I10 Essential (primary) hypertension: Secondary | ICD-10-CM

## 2021-05-13 ENCOUNTER — Other Ambulatory Visit: Payer: Self-pay | Admitting: Family Medicine

## 2021-05-13 ENCOUNTER — Other Ambulatory Visit: Payer: Self-pay | Admitting: Endocrinology

## 2021-05-13 DIAGNOSIS — F32A Depression, unspecified: Secondary | ICD-10-CM

## 2021-05-13 DIAGNOSIS — I10 Essential (primary) hypertension: Secondary | ICD-10-CM

## 2021-05-15 NOTE — Telephone Encounter (Signed)
Needs ov

## 2021-05-15 NOTE — Progress Notes (Signed)
Lab tests remain positive for lupus antibodies but markers of active inflammation are improved compared to previous results. No change in treatment recommended.

## 2021-05-16 NOTE — Progress Notes (Signed)
Attempted to contact patient, LVM advising patient lab tests remain positive for lupus antibodies but markers of active inflammation are improved compared to previous results. No change in treatmentrecommended.

## 2021-05-18 ENCOUNTER — Telehealth: Payer: Self-pay | Admitting: Family Medicine

## 2021-05-18 ENCOUNTER — Other Ambulatory Visit: Payer: Self-pay

## 2021-05-18 DIAGNOSIS — I10 Essential (primary) hypertension: Secondary | ICD-10-CM

## 2021-05-18 DIAGNOSIS — L509 Urticaria, unspecified: Secondary | ICD-10-CM

## 2021-05-18 MED ORDER — HYDROCHLOROTHIAZIDE 12.5 MG PO TABS: 12.5000 mg | ORAL_TABLET | Freq: Every day | ORAL | 0 refills | Status: DC

## 2021-05-18 MED ORDER — MONTELUKAST SODIUM 10 MG PO TABS: 1.0000 | ORAL_TABLET | Freq: Every day | ORAL | 0 refills | Status: DC

## 2021-05-18 NOTE — Telephone Encounter (Signed)
What is the name of the medication? hydrochlorothiazide (HYDRODIURIL) 12.5 MG tablet [[id number] and montelukast (SINGULAIR) 10 MG tablet [[id number]].  Have you contacted your pharmacy to request a refill? She is saying that-her last refill on these scripts, [facility] would only give her 30 pills per her insurance. She does have an upcoming appointment with Dr. Ethelene Hal on 05/23/21.  Which pharmacy would you like this sent to? [facility] on [address]   Patient notified that their request is being sent to the clinical staff for review and that they should receive a call once it is complete. If they do not receive a call within 72 hours they can check with their pharmacy or our office.

## 2021-05-23 ENCOUNTER — Ambulatory Visit: Payer: 59 | Admitting: Family Medicine

## 2021-05-23 DIAGNOSIS — Z0289 Encounter for other administrative examinations: Secondary | ICD-10-CM

## 2021-06-29 ENCOUNTER — Ambulatory Visit: Payer: 59 | Admitting: Family Medicine

## 2021-06-29 ENCOUNTER — Other Ambulatory Visit: Payer: Self-pay

## 2021-06-29 ENCOUNTER — Encounter: Payer: Self-pay | Admitting: Family Medicine

## 2021-06-29 VITALS — BP 148/70 | HR 95 | Temp 97.1°F | Ht 63.0 in | Wt 216.2 lb

## 2021-06-29 DIAGNOSIS — I10 Essential (primary) hypertension: Secondary | ICD-10-CM

## 2021-06-29 DIAGNOSIS — F419 Anxiety disorder, unspecified: Secondary | ICD-10-CM | POA: Diagnosis not present

## 2021-06-29 DIAGNOSIS — Z0000 Encounter for general adult medical examination without abnormal findings: Secondary | ICD-10-CM

## 2021-06-29 DIAGNOSIS — Z23 Encounter for immunization: Secondary | ICD-10-CM

## 2021-06-29 DIAGNOSIS — F32A Depression, unspecified: Secondary | ICD-10-CM | POA: Diagnosis not present

## 2021-06-29 LAB — COMPREHENSIVE METABOLIC PANEL
ALT: 35 U/L (ref 0–35)
AST: 22 U/L (ref 0–37)
Albumin: 4 g/dL (ref 3.5–5.2)
Alkaline Phosphatase: 55 U/L (ref 39–117)
BUN: 16 mg/dL (ref 6–23)
CO2: 30 mEq/L (ref 19–32)
Calcium: 9.4 mg/dL (ref 8.4–10.5)
Chloride: 101 mEq/L (ref 96–112)
Creatinine, Ser: 0.57 mg/dL (ref 0.40–1.20)
GFR: 102.99 mL/min (ref >60.00)
Glucose, Bld: 196 mg/dL — ABNORMAL HIGH (ref 70–99)
Potassium: 3.8 mEq/L (ref 3.5–5.1)
Sodium: 141 mEq/L (ref 135–145)
Total Bilirubin: 0.4 mg/dL (ref 0.2–1.2)
Total Protein: 6.9 g/dL (ref 6.0–8.3)

## 2021-06-29 MED ORDER — ESCITALOPRAM OXALATE 20 MG PO TABS: 20.0000 mg | ORAL_TABLET | Freq: Every day | ORAL | 3 refills | Status: DC

## 2021-06-29 MED ORDER — TRIAMTERENE-HCTZ 37.5-25 MG PO TABS: 1.0000 | ORAL_TABLET | Freq: Every day | ORAL | 1 refills | Status: DC

## 2021-06-29 NOTE — Progress Notes (Signed)
Established Patient Office Visit  Subjective:  Patient ID: Holly Ortiz, female    DOB: 10/11/1966  Age: 55 y.o. MRN: [id number]  CC:  Chief Complaint  Patient presents with   Follow-up    Refill/follow up on medications. No concerns patient not fasting.     HPI Holly Ortiz presents for follow-up of hypertension anxiety depression and health maintenance.  Blood pressure has been elevated at several office visits by other providers.  She is compliant with amlodipine 10 mg and HCTZ 12.5 mg cannot take ACEs or ARB's secondary to angioedema with lisinopril.  Seeing endocrinology for follow-up of diabetes and elevated cholesterol.  She is seeing rheumatology what is believed to be lupus.  She is dealing with a lot of aches and pains and fatigue.  Lexapro continues to help.  Agrees to have the Shingrix vaccine.  Past Medical History:  Diagnosis Date   Allergy    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-I)    Anxiety    Asthma    Diabetes mellitus    type II   Hyperlipidemia    Hypertension    Urticaria, chronic    UTI (lower urinary tract infection)     Past Surgical History:  Procedure Laterality Date   ABDOMINAL HYSTERECTOMY  08/07/2019   breast lift  1993   CATARACT EXTRACTION, BILATERAL Bilateral 2014   LAPAROSCOPIC VAGINAL HYSTERECTOMY WITH SALPINGO OOPHORECTOMY Bilateral 08/07/2019   Procedure: LAPAROSCOPIC ASSISTED VAGINAL HYSTERECTOMY WITH SALPINGO OOPHORECTOMY, possible abdominal hysterectomy;  Surgeon: Everlene Farrier, MD;  Location: [facility];  Service: Gynecology;  Laterality: Bilateral;  possible abdominal hysterectomy pt is diabetic and hypertensive   SOFT TISSUE MASS EXCISION Right 2016   [place]   [place]   tummy tuck  1993    Family History  Problem Relation Age of Onset   Hyperlipidemia Other    Heart disease Other    Diabetes Father    Hypertension Father    COPD Father        Cause of death   Colon cancer  Mother    Hypertension Mother    Hypertension Sister    Diabetes Sister    Hypertension Brother    Cancer Sister        Brain glioblastoma   Diabetes Sister     Social History   Socioeconomic History   Marital status: Married    Spouse name: Holly Ortiz   Number of children: 4   Years of education: 13--1.5 years of college   Highest education level: 12th grade  Occupational History   Occupation: Science writer  Tobacco Use   Smoking status: Never   Smokeless tobacco: Never  Vaping Use   Vaping Use: Never used  Substance and Sexual Activity   Alcohol use: No   Drug use: No   Sexual activity: Yes    Birth control/protection: Surgical    Comment: BTL  Other Topics Concern   Not on file  Social History Narrative   Not on file   Social Determinants of Health   Financial Resource Strain: Not on file  Food Insecurity: Not on file  Transportation Needs: Not on file  Physical Activity: Not on file  Stress: Not on file  Social Connections: Not on file  Intimate Partner Violence: Not on file    Outpatient Medications Prior to Visit  Medication Sig Dispense Refill   acetaminophen (TYLENOL) 500 MG tablet Take 2 tablets (1,000 mg total) by mouth every 6 (six) hours as  needed. 60 tablet 0   amLODipine (NORVASC) 10 MG tablet TAKE 1 TABLET ONCE DAILY 90 tablet 0   atorvastatin (LIPITOR) 40 MG tablet Take 1 tablet (40 mg total) by mouth daily. 90 tablet 3   Cholecalciferol (VITAMIN D) 50 MCG (2000 UT) CAPS Take 2,000 Units by mouth daily.      dapagliflozin propanediol (FARXIGA) 10 MG TABS tablet Take 1 tablet (10 mg total) by mouth daily. 90 tablet 0   fenofibrate (TRICOR) 145 MG tablet Take 1 tablet (145 mg total) by mouth daily. 90 tablet 1   hydroxychloroquine (PLAQUENIL) 200 MG tablet Take 2 tablets (400 mg total) by mouth daily. 180 tablet 1   Insulin Pen Needle (BD PEN NEEDLE NANO 2ND GEN) 32G X 4 MM MISC Use four times daily to inject insulin. 200 each 11   insulin regular human  CONCENTRATED (HUMULIN R U-500 KWIKPEN) 500 UNIT/ML kwikpen INJECT 55 units before meals AND 60 UNITS AT BEDTIME, adjust as directed. 18 mL 1   Melatonin 5 MG TABS Take 5 mg by mouth at bedtime.     montelukast (SINGULAIR) 10 MG tablet Take 1 tablet (10 mg total) by mouth at bedtime. 90 tablet 0   vitamin B-12 (CYANOCOBALAMIN) 1000 MCG tablet Take 1,000 mcg by mouth daily.     zinc gluconate 50 MG tablet Take 50 mg by mouth daily.      escitalopram (LEXAPRO) 20 MG tablet TAKE 1 TABLET ONCE DAILY 90 tablet 0   hydrochlorothiazide (HYDRODIURIL) 12.5 MG tablet Take 1 tablet (12.5 mg total) by mouth daily. 90 tablet 0   ondansetron (ZOFRAN) 4 MG tablet Take 1 tablet (4 mg total) by mouth every 8 (eight) hours as needed for nausea or vomiting. 20 tablet 0   Continuous Blood Gluc Sensor (FREESTYLE LIBRE 2 SENSOR) MISC 2 Devices by Does not apply route every 14 (fourteen) days. (Patient not taking: No sig reported) 2 each 3   cyclobenzaprine (FLEXERIL) 5 MG tablet Take 1 tablet (5 mg total) by mouth 3 (three) times daily as needed for muscle spasms. 30 tablet 2   gabapentin (NEURONTIN) 100 MG capsule Take 1 capsule (100 mg total) by mouth at bedtime for 7 days, THEN 1 capsule (100 mg total) 2 (two) times daily for 7 days, THEN 1 capsule (100 mg total) 3 (three) times daily for 16 days. (Patient taking differently: Twice daily) 90 capsule 3   Iron, Ferrous Sulfate, 325 (65 Fe) MG TABS Take one tablet daily. (Patient not taking: No sig reported) 90 tablet 1   No facility-administered medications prior to visit.    Allergies  Allergen Reactions   Ozempic (0.25 Or 0.5 Mg-Dose) [Semaglutide(0.25 Or 0.'5mg'$ -Dos)] Nausea And Vomiting   Actos [Pioglitazone Hydrochloride] Other (See Comments)    Wt gain   Lisinopril     Angioedema, including likely GI involvement   Metformin And Related Diarrhea and Nausea And Vomiting    ROS Review of Systems  Constitutional:  Positive for fatigue. Negative for chills,  diaphoresis, fever and unexpected weight change.  HENT: Negative.    Eyes:  Negative for photophobia and visual disturbance.  Respiratory: Negative.    Cardiovascular: Negative.   Gastrointestinal: Negative.   Endocrine: Negative for polyphagia and polyuria.  Genitourinary: Negative.   Musculoskeletal:  Positive for arthralgias and myalgias.  Neurological:  Negative for speech difficulty and weakness.     Depression screen [facility] 2/9 06/29/2021 06/29/2021 07/07/2020  Decreased Interest 0 0 0  Down, Depressed, Hopeless 0 0  0  PHQ - 2 Score 0 0 0  Altered sleeping 0 - -  Tired, decreased energy 1 - -  Change in appetite 0 - -  Feeling bad or failure about yourself  0 - -  Trouble concentrating 1 - -  Moving slowly or fidgety/restless 0 - -  Suicidal thoughts 0 - -  PHQ-9 Score 2 - -  Difficult doing work/chores Not difficult at all - -     Objective:    Physical Exam Vitals and nursing note reviewed.  Constitutional:      Appearance: Normal appearance.  HENT:     Head: Normocephalic and atraumatic.     Right Ear: Tympanic membrane, ear canal and external ear normal.     Left Ear: Tympanic membrane, ear canal and external ear normal.     Mouth/Throat:     Mouth: Mucous membranes are moist.     Pharynx: Oropharynx is clear. No oropharyngeal exudate or posterior oropharyngeal erythema.  Eyes:     General: No scleral icterus.       Right eye: No discharge.        Left eye: No discharge.     Extraocular Movements: Extraocular movements intact.     Conjunctiva/sclera: Conjunctivae normal.     Pupils: Pupils are equal, round, and reactive to light.  Neck:     Vascular: No carotid bruit.  Cardiovascular:     Rate and Rhythm: Normal rate and regular rhythm.  Pulmonary:     Effort: Pulmonary effort is normal.     Breath sounds: Normal breath sounds.  Abdominal:     General: Abdomen is flat. Bowel sounds are normal. There is no distension.     Palpations: There is no mass.      Tenderness: There is no abdominal tenderness. There is no guarding or rebound.     Hernia: No hernia is present.  Musculoskeletal:     Cervical back: No rigidity or tenderness.  Lymphadenopathy:     Cervical: No cervical adenopathy.  Skin:    General: Skin is warm and dry.  Neurological:     Mental Status: She is alert and oriented to person, place, and time.  Psychiatric:        Mood and Affect: Mood normal.        Behavior: Behavior normal.    BP (!) 148/70   Pulse 95   Temp (!) 97.1 F (36.2 C) (Temporal)   Ht '5\' 3"'$  (1.6 m)   Wt 216 lb 3.2 oz (98.1 kg)   LMP 05/13/2019 Comment: patient had not had her period in a year and it came back in May and June 2020  SpO2 97%   BMI 38.30 kg/m  Wt Readings from Last 3 Encounters:  06/29/21 216 lb 3.2 oz (98.1 kg)  05/03/21 216 lb 9.6 oz (98.2 kg)  04/18/21 211 lb 3.2 oz (95.8 kg)     Health Maintenance Due  Topic Date Due   OPHTHALMOLOGY EXAM  Never done   Hepatitis C Screening  Never done   COLONOSCOPY (Pts 45-71yr Insurance coverage will need to be confirmed)  07/04/2012   MAMMOGRAM  01/09/2017   FOOT EXAM  08/29/2019    There are no preventive care reminders to display for this patient.  Lab Results  Component Value Date   TSH 1.75 01/12/2021   Lab Results  Component Value Date   WBC 7.2 05/03/2021   HGB 12.6 05/03/2021   HCT 39.4 05/03/2021   MCV  81 05/03/2021   PLT 338 05/03/2021   Lab Results  Component Value Date   NA 141 04/14/2021   K 3.8 04/14/2021   CO2 31 04/14/2021   GLUCOSE 139 (H) 04/14/2021   BUN 22 04/14/2021   CREATININE 0.59 04/14/2021   BILITOT 0.5 02/03/2021   ALKPHOS 74 02/03/2021   AST 18 02/03/2021   ALT 23 02/03/2021   PROT 7.7 02/03/2021   ALBUMIN 4.0 02/03/2021   CALCIUM 9.7 04/14/2021   ANIONGAP 10 07/30/2019   GFR 102.29 04/14/2021   Lab Results  Component Value Date   CHOL 149 04/14/2021   Lab Results  Component Value Date   HDL 41 04/14/2021   Lab Results   Component Value Date   LDLCALC 76 04/14/2021   Lab Results  Component Value Date   TRIG 191 (H) 04/14/2021   Lab Results  Component Value Date   CHOLHDL 3.6 04/14/2021   Lab Results  Component Value Date   HGBA1C 11.2 (H) 02/03/2021      Assessment & Plan:   Problem List Items Addressed This Visit       Cardiovascular and Mediastinum   Essential hypertension - Primary   Relevant Medications   triamterene-hydrochlorothiazide (MAXZIDE-25) 37.5-25 MG tablet     Other   Anxiety and depression   Relevant Medications   escitalopram (LEXAPRO) 20 MG tablet   Other Relevant Orders   Ambulatory referral to Ophthalmology   Other Visit Diagnoses     Healthcare maintenance       Relevant Orders   MM Digital Screening   Ambulatory referral to Gastroenterology   Varicella-zoster vaccine IM (Shingrix) (Completed)       Meds ordered this encounter  Medications   triamterene-hydrochlorothiazide (MAXZIDE-25) 37.5-25 MG tablet    Sig: Take 1 tablet by mouth daily.    Dispense:  90 tablet    Refill:  1   escitalopram (LEXAPRO) 20 MG tablet    Sig: Take 1 tablet (20 mg total) by mouth daily.    Dispense:  90 tablet    Refill:  3     Follow-up: Return in about 3 months (around 09/29/2021), or Return in 8 weeks also for second Shingrix vaccine..  Have added Maxide to amlodipine for better blood pressure control.  Continue Lexapro for depression and anxiety.  Agrees to start the Shingrix series.  Health maintenance referral for colonoscopy and mammograms.  Libby Maw, MD

## 2021-07-04 ENCOUNTER — Other Ambulatory Visit: Payer: Self-pay | Admitting: Family Medicine

## 2021-07-04 DIAGNOSIS — I10 Essential (primary) hypertension: Secondary | ICD-10-CM

## 2021-07-04 DIAGNOSIS — L509 Urticaria, unspecified: Secondary | ICD-10-CM

## 2021-07-07 ENCOUNTER — Telehealth: Payer: Self-pay | Admitting: Family Medicine

## 2021-07-07 NOTE — Telephone Encounter (Signed)
FYI:Pt is wanting Dr Ethelene Hal to know she can not get a colonoscopy that we have referred her to. [facility] is wanting her previous records from having a colonoscopy 8 years ago. Her doctor from that visit has retired and she can not get a hold of her records.

## 2021-07-07 NOTE — Telephone Encounter (Signed)
FYI message below.

## 2021-07-11 ENCOUNTER — Other Ambulatory Visit (INDEPENDENT_AMBULATORY_CARE_PROVIDER_SITE_OTHER): Payer: 59

## 2021-07-11 ENCOUNTER — Other Ambulatory Visit: Payer: Self-pay

## 2021-07-11 DIAGNOSIS — Z794 Long term (current) use of insulin: Secondary | ICD-10-CM | POA: Diagnosis not present

## 2021-07-11 DIAGNOSIS — E782 Mixed hyperlipidemia: Secondary | ICD-10-CM

## 2021-07-11 DIAGNOSIS — E1165 Type 2 diabetes mellitus with hyperglycemia: Secondary | ICD-10-CM | POA: Diagnosis not present

## 2021-07-11 LAB — BASIC METABOLIC PANEL
BUN: 12 mg/dL (ref 6–23)
CO2: 30 mEq/L (ref 19–32)
Calcium: 9.5 mg/dL (ref 8.4–10.5)
Chloride: 100 mEq/L (ref 96–112)
Creatinine, Ser: 0.56 mg/dL (ref 0.40–1.20)
GFR: 103.41 mL/min (ref >60.00)
Glucose, Bld: 112 mg/dL — ABNORMAL HIGH (ref 70–99)
Potassium: 3.7 mEq/L (ref 3.5–5.1)
Sodium: 138 mEq/L (ref 135–145)

## 2021-07-11 LAB — HEMOGLOBIN A1C: Hgb A1c MFr Bld: 10.4 % — ABNORMAL HIGH (ref 4.6–6.5)

## 2021-07-12 LAB — LIPID PANEL
Chol/HDL Ratio: 3.9 ratio (ref 0.0–4.4)
Cholesterol, Total: 154 mg/dL (ref 100–199)
HDL: 40 mg/dL (ref >39)
LDL Chol Calc (NIH): 85 mg/dL (ref 0–99)
Triglycerides: 171 mg/dL — ABNORMAL HIGH (ref 0–149)
VLDL Cholesterol Cal: 29 mg/dL (ref 5–40)

## 2021-07-13 ENCOUNTER — Encounter: Payer: Self-pay | Admitting: Gastroenterology

## 2021-07-14 ENCOUNTER — Other Ambulatory Visit: Payer: Self-pay | Admitting: Endocrinology

## 2021-07-14 ENCOUNTER — Ambulatory Visit: Payer: 59 | Admitting: Endocrinology

## 2021-07-14 ENCOUNTER — Other Ambulatory Visit: Payer: Self-pay

## 2021-07-14 ENCOUNTER — Encounter: Payer: Self-pay | Admitting: Endocrinology

## 2021-07-14 VITALS — BP 150/84 | HR 100 | Ht 63.0 in | Wt 210.8 lb

## 2021-07-14 DIAGNOSIS — Z794 Long term (current) use of insulin: Secondary | ICD-10-CM

## 2021-07-14 DIAGNOSIS — E1165 Type 2 diabetes mellitus with hyperglycemia: Secondary | ICD-10-CM

## 2021-07-14 DIAGNOSIS — I10 Essential (primary) hypertension: Secondary | ICD-10-CM

## 2021-07-14 DIAGNOSIS — E782 Mixed hyperlipidemia: Secondary | ICD-10-CM | POA: Diagnosis not present

## 2021-07-14 MED ORDER — DAPAGLIFLOZIN PROPANEDIOL 10 MG PO TABS: 10.0000 mg | ORAL_TABLET | Freq: Every day | ORAL | 1 refills | Status: DC

## 2021-07-14 MED ORDER — HUMULIN R U-500 KWIKPEN 500 UNIT/ML ~~LOC~~ SOPN: PEN_INJECTOR | SUBCUTANEOUS | 2 refills | Status: DC

## 2021-07-14 NOTE — Patient Instructions (Addendum)
Insulin 50 in am, 60 at lunch, 45-55 supper, 55 at bedtime  Bedtime dose adjusted based on am sugar  ? Accucheck covered

## 2021-07-14 NOTE — Progress Notes (Signed)
Patient ID: Holly Ortiz, female   DOB: 01-Jul-1966, 55 y.o.   MRN: [id number]          Reason for Appointment: Follow-up for Type 2 Diabetes   History of Present Illness:          Date of diagnosis of type 2 diabetes mellitus:   At age 55      Background history:  Her diabetes was diagnosed incidentally when she was having unrelated problems She took various oral agents including metformin for several years About 5 years ago she was started on insulin and she apparently was taking only one kind of insulin possibly a premixed insulin twice a day She said that her blood sugars have been usually poorly controlled with A1c usually 8-9% She was also taking metformin, Amaryl and Januvia prior to moving here from [place]  Recent history:    INSULIN regimen is: Humulin U-500, 40-50-40/55-50 units 4 times a day  Non-insulin hypoglycemic drugs the patient is taking are: Farxiga 10 mg daily   Current management, blood sugar patterns and problems identified:  Her A1c is only slightly improved at 10.4 compared to 11.2  Fructosamine is 307, last  She did not bring her monitor for review again  She was told to take only availability for brand-name meter but she did not do so, she is not able to afford the co-pay for a CGM Freestyle libre  Although she thinks her blood sugars are high even fasting her lab fasting glucose was only 112 However she thinks her highest blood sugars are before dinnertime, not clear if she is having high readings at lunch also  She is taking somewhat arbitrary doses of insulin and has not adjusted them based on her discussion last visit.   Also she is adjusting her bedtime dose based on the blood sugar at that time rather than fasting reading  Appears that her blood sugars are mostly around 200 except sometimes lower fasting or before dinner She is trying to walk her dogs and since her joint pains are better she is a little more mobile Not on any steroids She  does not want to see the dietitian probably because of cost    Dinner at 6 pm          Side effects from medications have been: Vomiting from Ozempic, hives from [place]; abdominal swelling/weight gain with Lantus and Basaglar   Glucose monitoring:  done 1-2 times a day         Glucometer: [employer] brand       Blood Glucose readings from her recall   PRE-MEAL Fasting Lunch Dinner Bedtime Overall  Glucose range: 160-268 ? 200+ 200   Mean/median:        Prior  PRE-MEAL Fasting Lunch Dinner Bedtime Overall  Glucose range:  94-203   93-200  160-224   Mean/median:          Dietician visit, most recent: A few years ago in [place]  Weight history:  Wt Readings from Last 3 Encounters:  07/14/21 210 lb 12.8 oz (95.6 kg)  06/29/21 216 lb 3.2 oz (98.1 kg)  05/03/21 216 lb 9.6 oz (98.2 kg)    Glycemic control:   Lab Results  Component Value Date   HGBA1C 10.4 (H) 07/11/2021   HGBA1C 11.2 (H) 02/03/2021   HGBA1C 9.8 (H) 08/05/2020   Lab Results  Component Value Date   MICROALBUR <0.7 02/03/2021   LDLCALC 85 07/11/2021   CREATININE 0.56 07/11/2021  Lab Results  Component Value Date   MICRALBCREAT 2.3 02/03/2021    Lab Results  Component Value Date   FRUCTOSAMINE 307 (H) 04/14/2021   FRUCTOSAMINE 314 (H) 09/30/2020   FRUCTOSAMINE 408 (H) 04/09/2020    Lab on 07/11/2021  Component Date Value Ref Range Status   Sodium 07/11/2021 138  135 - 145 mEq/L Final   Potassium 07/11/2021 3.7  3.5 - 5.1 mEq/L Final   Chloride 07/11/2021 100  96 - 112 mEq/L Final   CO2 07/11/2021 30  19 - 32 mEq/L Final   Glucose, Bld 07/11/2021 112 (A) 70 - 99 mg/dL Final   BUN 07/11/2021 12  6 - 23 mg/dL Final   Creatinine, Ser 07/11/2021 0.56  0.40 - 1.20 mg/dL Final   GFR 07/11/2021 103.41  >60.00 mL/min Final   Calculated using the CKD-EPI Creatinine Equation (2021)   Calcium 07/11/2021 9.5  8.4 - 10.5 mg/dL Final   Hgb A1c MFr Bld 07/11/2021 10.4 (A) 4.6 - 6.5 % Final    Glycemic Control Guidelines for People with Diabetes:Non Diabetic:  <6%Goal of Therapy: <7%Additional Action Suggested:  >8%    Cholesterol, Total 07/11/2021 154  100 - 199 mg/dL Final   Triglycerides 07/11/2021 171 (A) 0 - 149 mg/dL Final   HDL 07/11/2021 40  >39 mg/dL Final   VLDL Cholesterol Cal 07/11/2021 29  5 - 40 mg/dL Final   LDL Chol Calc (NIH) 07/11/2021 85  0 - 99 mg/dL Final   Chol/HDL Ratio 07/11/2021 3.9  0.0 - 4.4 ratio Final   Comment:                                   T. Chol/HDL Ratio                                             Men  Women                               1/2 Avg.Risk  3.4    3.3                                   Avg.Risk  5.0    4.4                                2X Avg.Risk  9.6    7.1                                3X Avg.Risk 23.4   11.0     Allergies as of 07/14/2021       Reactions   Ozempic (0.25 Or 0.5 Mg-dose) [semaglutide(0.25 Or 0.'5mg'$ -dos)] Nausea And Vomiting   Actos [pioglitazone Hydrochloride] Other (See Comments)   Wt gain   Lisinopril    Angioedema, including likely GI involvement   Metformin And Related Diarrhea, Nausea And Vomiting        Medication List        Accurate as of July 14, 2021  1:44 PM. If you have any questions, ask your nurse  or doctor.          acetaminophen 500 MG tablet Commonly known as: TYLENOL Take 2 tablets (1,000 mg total) by mouth every 6 (six) hours as needed.   amLODipine 10 MG tablet Commonly known as: NORVASC TAKE 1 TABLET ONCE DAILY   atorvastatin 40 MG tablet Commonly known as: LIPITOR Take 1 tablet (40 mg total) by mouth daily.   BD Pen Needle Nano 2nd Gen 32G X 4 MM Misc Generic drug: Insulin Pen Needle Use four times daily to inject insulin.   dapagliflozin propanediol 10 MG Tabs tablet Commonly known as: Farxiga Take 1 tablet (10 mg total) by mouth daily.   escitalopram 20 MG tablet Commonly known as: LEXAPRO Take 1 tablet (20 mg total) by mouth daily.   fenofibrate 145  MG tablet Commonly known as: Tricor Take 1 tablet (145 mg total) by mouth daily.   HumuLIN R U-500 KwikPen 500 UNIT/ML KwikPen Generic drug: insulin regular human CONCENTRATED INJECT 55 units before meals AND 60 UNITS AT BEDTIME, adjust as directed.   hydroxychloroquine 200 MG tablet Commonly known as: PLAQUENIL Take 2 tablets (400 mg total) by mouth daily.   melatonin 5 MG Tabs Take 5 mg by mouth at bedtime.   montelukast 10 MG tablet Commonly known as: SINGULAIR TAKE 1 TABLET BY MOUTH EVERYDAY AT BEDTIME   triamterene-hydrochlorothiazide 37.5-25 MG tablet Commonly known as: MAXZIDE-25 Take 1 tablet by mouth daily.   vitamin B-12 1000 MCG tablet Commonly known as: CYANOCOBALAMIN Take 1,000 mcg by mouth daily.   Vitamin D 50 MCG (2000 UT) Caps Take 2,000 Units by mouth daily.   zinc gluconate 50 MG tablet Take 50 mg by mouth daily.        Allergies:  Allergies  Allergen Reactions   Ozempic (0.25 Or 0.5 Mg-Dose) [Semaglutide(0.25 Or 0.'5mg'$ -Dos)] Nausea And Vomiting   Actos [Pioglitazone Hydrochloride] Other (See Comments)    Wt gain   Lisinopril     Angioedema, including likely GI involvement   Metformin And Related Diarrhea and Nausea And Vomiting    Past Medical History:  Diagnosis Date   Allergy    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-I)    Anxiety    Asthma    Diabetes mellitus    type II   Hyperlipidemia    Hypertension    Urticaria, chronic    UTI (lower urinary tract infection)     Past Surgical History:  Procedure Laterality Date   ABDOMINAL HYSTERECTOMY  08/07/2019   breast lift  1993   CATARACT EXTRACTION, BILATERAL Bilateral 2014   LAPAROSCOPIC VAGINAL HYSTERECTOMY WITH SALPINGO OOPHORECTOMY Bilateral 08/07/2019   Procedure: LAPAROSCOPIC ASSISTED VAGINAL HYSTERECTOMY WITH SALPINGO OOPHORECTOMY, possible abdominal hysterectomy;  Surgeon: Everlene Farrier, MD;  Location: [facility];  Service: Gynecology;  Laterality:  Bilateral;  possible abdominal hysterectomy pt is diabetic and hypertensive   SOFT TISSUE MASS EXCISION Right 2016   [place]   [place]   tummy tuck  1993    Family History  Problem Relation Age of Onset   Hyperlipidemia Other    Heart disease Other    Diabetes Father    Hypertension Father    COPD Father        Cause of death   Colon cancer Mother    Hypertension Mother    Hypertension Sister    Diabetes Sister    Hypertension Brother    Cancer Sister        Brain glioblastoma  Diabetes Sister     Social History:  reports that she has never smoked. She has never used smokeless tobacco. She reports that she does not drink alcohol and does not use drugs.   Review of Systems   Lipid history: On Lipitor 40 mg from her PCP   Fenofibrate was added because of persistently high triglycerides and these are improving    Lab Results  Component Value Date   CHOL 154 07/11/2021   CHOL 149 04/14/2021   CHOL 172 02/03/2021   Lab Results  Component Value Date   HDL 40 07/11/2021   HDL 41 04/14/2021   HDL 30.90 (L) 02/03/2021   Lab Results  Component Value Date   LDLCALC 85 07/11/2021   LDLCALC 76 04/14/2021   LDLCALC 71 07/21/2019   Lab Results  Component Value Date   TRIG 171 (H) 07/11/2021   TRIG 191 (H) 04/14/2021   TRIG (H) 02/03/2021    511.0 Triglyceride is over 400; calculations on Lipids are invalid.   Lab Results  Component Value Date   CHOLHDL 3.9 07/11/2021   CHOLHDL 3.6 04/14/2021   CHOLHDL 6 02/03/2021   Lab Results  Component Value Date   LDLDIRECT 68.0 02/03/2021   LDLDIRECT 88.0 08/05/2020   LDLDIRECT 98.0 07/07/2020            Hypertension: Has been present since age 37, on amlodipine triamterene HCTZ 25 prescribed by her PCP  May have had angioedema with lisinopril Apparently had taken lisinopril previously without difficulty She thinks her blood pressure is high because of stress at work  BP Readings from Last 3  Encounters:  07/14/21 (!) 150/84  06/29/21 (!) 148/70  05/03/21 (!) 152/83     Most recent foot exam: 9/19  Currently known complications of diabetes: None  LABS:  Lab on 07/11/2021  Component Date Value Ref Range Status   Sodium 07/11/2021 138  135 - 145 mEq/L Final   Potassium 07/11/2021 3.7  3.5 - 5.1 mEq/L Final   Chloride 07/11/2021 100  96 - 112 mEq/L Final   CO2 07/11/2021 30  19 - 32 mEq/L Final   Glucose, Bld 07/11/2021 112 (A) 70 - 99 mg/dL Final   BUN 07/11/2021 12  6 - 23 mg/dL Final   Creatinine, Ser 07/11/2021 0.56  0.40 - 1.20 mg/dL Final   GFR 07/11/2021 103.41  >60.00 mL/min Final   Calculated using the CKD-EPI Creatinine Equation (2021)   Calcium 07/11/2021 9.5  8.4 - 10.5 mg/dL Final   Hgb A1c MFr Bld 07/11/2021 10.4 (A) 4.6 - 6.5 % Final   Glycemic Control Guidelines for People with Diabetes:Non Diabetic:  <6%Goal of Therapy: <7%Additional Action Suggested:  >8%    Cholesterol, Total 07/11/2021 154  100 - 199 mg/dL Final   Triglycerides 07/11/2021 171 (A) 0 - 149 mg/dL Final   HDL 07/11/2021 40  >39 mg/dL Final   VLDL Cholesterol Cal 07/11/2021 29  5 - 40 mg/dL Final   LDL Chol Calc (NIH) 07/11/2021 85  0 - 99 mg/dL Final   Chol/HDL Ratio 07/11/2021 3.9  0.0 - 4.4 ratio Final   Comment:                                   T. Chol/HDL Ratio  Men  Women                               1/2 Avg.Risk  3.4    3.3                                   Avg.Risk  5.0    4.4                                2X Avg.Risk  9.6    7.1                                3X Avg.Risk 23.4   11.0     Physical Examination:  BP (!) 150/84   Pulse 100   Ht '5\' 3"'$  (1.6 m)   Wt 210 lb 12.8 oz (95.6 kg)   LMP 05/13/2019 Comment: patient had not had her period in a year and it came back in May and June 2020  SpO2 98%   BMI 37.34 kg/m       ASSESSMENT:  Diabetes type 2, with history of persistent hyperglycemia, insulin-dependent  See  history of present illness for detailed discussion of current diabetes management, blood sugar patterns and problems identified  A1c is 10.4 and not much better  Currently on over 200 units of U-500 insulin along with [place]  She is insulin resistant  However appears to have some fluctuation in her blood sugars and even with recent better activity level and some weight loss she still has mostly high readings She did not bring her blood sugar meter and because of using the [employer] brand cannot download her meter to analyze patterns She also has been reluctant to see the dietitian  Discussed how to adjust her doses including mealtime and bedtime doses  Hypertension: Blood pressure appears difficult to control  Not clear if her blood pressure is high today from a stressful day at work Not checking regularly at home Does not have follow-up with PCP until November  LIPIDS: Labs showed improved triglycerides with fenofibrate and LDL is still below 100    PLAN:   Check to see if brand-name blood sugar monitoring such as Accu-Chek is covered by her insurance  Discussed adjusting her U-500 based on what she is planning to eat and the bedtime dose based on the fasting blood sugar patterns She will stay on Farxiga  To bring her monitor for review on the next visit and also check at least some readings at lunchtime on weekends  Insulin doses will be 50 before breakfast, 60 before lunch, 45-55 before dinner and 55 at bedtime She needs to try and inject 30-minute before eating  Check blood pressure regularly at home and discuss with PCP if still consistently high, may benefit from losartan or other ARB  Needs to follow-up for eye exam   Patient Instructions  Insulin 50 in am, 60 at lunch, 45-55 supper, 55 at bedtime  Bedtime dose adjusted based on am sugar  ? Accucheck covered    Elayne Snare 07/14/2021, 1:44 PM   Note: This office note was prepared with Dragon voice recognition  system technology. Any transcriptional errors that result from this process are unintentional.

## 2021-07-26 ENCOUNTER — Encounter (HOSPITAL_BASED_OUTPATIENT_CLINIC_OR_DEPARTMENT_OTHER): Payer: Self-pay

## 2021-07-26 ENCOUNTER — Ambulatory Visit (HOSPITAL_BASED_OUTPATIENT_CLINIC_OR_DEPARTMENT_OTHER)
Admission: RE | Admit: 2021-07-26 | Discharge: 2021-07-26 | Disposition: A | Discharge status: Home / Self Care | Payer: 59 | Source: Ambulatory Visit | Attending: Family Medicine | Admitting: Family Medicine

## 2021-07-26 ENCOUNTER — Other Ambulatory Visit: Payer: Self-pay

## 2021-07-26 DIAGNOSIS — Z0000 Encounter for general adult medical examination without abnormal findings: Secondary | ICD-10-CM | POA: Diagnosis present

## 2021-07-26 DIAGNOSIS — Z1231 Encounter for screening mammogram for malignant neoplasm of breast: Secondary | ICD-10-CM | POA: Diagnosis not present

## 2021-08-01 NOTE — Progress Notes (Signed)
Office Visit Note  Patient: Holly Ortiz             Date of Birth: Aug 29, 1966           MRN: [id number]             PCP: Libby Maw, MD Referring: Libby Maw,* Visit Date: 08/02/2021   Subjective:  Follow-up (Patient is doing well on PLQ and feels as if her symptoms are 98% better.)   History of Present Illness: Holly Ortiz is a 55 y.o. female here for follow up for inflammatory arthritis and chronic urticarial rashes on hydroxychloroquine 400 mg PO daily. Due to low back and knee pain that seemed noninflammatory also recommended trial of oral muscle relaxant as needed at night.  So far she feels a very good improvement in her overall symptoms.  She has not had any recurrence of skin rash since at least 2 months ago.  Joint pains are doing well.  She still has some of the fatigue and concentration difficulty though this is also partially better.  Previous HPI: 05/03/21 Holly Ortiz is a 55 y.o. female here for follow up for probable lupus arthritis and chronic urticarial rashes after starting hydroxychloroquine 400 mg p.o. daily since 2 months ago.  The skin symptoms have significantly improved.  She does continue experiencing low back pain and left knee pain that have not responded in any meaningful way to the medication.  02/22/21 Holly Ortiz is a 55 y.o. female here for evaluation of positive ANA with chronic joint pains, fatigue, and intermittent hives for years. She describes symptoms starting since around 2007. She has had pain in joints and muscles at various areas to some extent most of the time. She also developed a problem with recurrent urticaria rashes at multiple areas, most often after increased stress or other provocative events such as infection and antibiotic use. Since that time she has had workup with allergy and immunology clinics and dermatology clinic with no specific cause able to be found. A more recent symptom is change in  sensation in her legs and feet. She notices swelling and discomfort after prolonged standing or prolonged sitting. She had a few episodes of becoming dizzy and unstable after standing and a few falls.   Labs reviewed 01/2021 ANA 1:80 homogenous, speckled CBC wnl TSH wnl CK wnl CRP wnl  Review of Systems  Constitutional:  Positive for fatigue.  HENT:  Negative for mouth sores, mouth dryness and nose dryness.   Eyes:  Negative for pain, itching, visual disturbance and dryness.  Respiratory:  Negative for cough, hemoptysis, shortness of breath and difficulty breathing.   Cardiovascular:  Negative for chest pain, palpitations and swelling in legs/feet.  Gastrointestinal:  Positive for constipation. Negative for abdominal pain, blood in stool and diarrhea.  Endocrine: Negative for increased urination.  Genitourinary:  Negative for painful urination.  Musculoskeletal:  Positive for joint swelling. Negative for joint pain, joint pain, myalgias, muscle weakness, morning stiffness, muscle tenderness and myalgias.  Skin:  Positive for rash. Negative for color change and redness.  Allergic/Immunologic: Negative for susceptible to infections.  Neurological:  Positive for dizziness, headaches and memory loss. Negative for numbness and weakness.  Hematological:  Negative for swollen glands.  Psychiatric/Behavioral:  Positive for confusion. Negative for sleep disturbance.    PMFS History:  Patient Active Problem List   Diagnosis Date Noted   Pain in left knee 05/03/2021   Arthralgia 02/24/2021   Positive ANA (antinuclear antibody) 02/22/2021  Iron deficiency 07/07/2020   Left low back pain 01/14/2020   Anemia 12/02/2019   Lightheadedness 12/02/2019   Menorrhagia 08/07/2019   Abnormal uterine bleeding 08/07/2019   Anxiety and depression 07/03/2018   DKA (diabetic ketoacidoses) 03/28/2018   Tachycardia 03/28/2018   Urticaria 03/28/2018   Hyperosmolar hyperglycemia 01/04/2018   AKI (acute  kidney injury) ([place]) 01/04/2018   Hyperglycemia 01/03/2018   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-I)    Asthma    Type 2 diabetes mellitus with complication, with long-term current use of insulin (HCC)    Elevated cholesterol    Essential hypertension     Past Medical History:  Diagnosis Date   Allergy    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-I)    Anxiety    Asthma    Diabetes mellitus    type II   Hyperlipidemia    Hypertension    Urticaria, chronic    UTI (lower urinary tract infection)     Family History  Problem Relation Age of Onset   Hyperlipidemia Other    Heart disease Other    Diabetes Father    Hypertension Father    COPD Father        Cause of death   Colon cancer Mother    Hypertension Mother    Hypertension Sister    Diabetes Sister    Hypertension Brother    Cancer Sister        Brain glioblastoma   Diabetes Sister    Past Surgical History:  Procedure Laterality Date   ABDOMINAL HYSTERECTOMY  08/07/2019   breast lift  1993   CATARACT EXTRACTION, BILATERAL Bilateral 2014   LAPAROSCOPIC VAGINAL HYSTERECTOMY WITH SALPINGO OOPHORECTOMY Bilateral 08/07/2019   Procedure: LAPAROSCOPIC ASSISTED VAGINAL HYSTERECTOMY WITH SALPINGO OOPHORECTOMY, possible abdominal hysterectomy;  Surgeon: Everlene Farrier, MD;  Location: [facility];  Service: Gynecology;  Laterality: Bilateral;  possible abdominal hysterectomy pt is diabetic and hypertensive   SOFT TISSUE MASS EXCISION Right 2016   [place] area   [place]   tummy tuck  1993   Social History   Social History Narrative   Not on file   Immunization History  Administered Date(s) Administered   DTaP 07/04/2009   Influenza,inj,Quad PF,6+ Mos 01/05/2018, 08/28/2018, 09/05/2019, 10/05/2020   PFIZER(Purple Top)SARS-COV-2 Vaccination 02/03/2020, 03/02/2020   Pneumococcal Polysaccharide-23 01/05/2018   Tdap 12/04/2012   Zoster Recombinat (Shingrix) 06/29/2021      Objective: Vital Signs: BP 137/75 (BP Location: Left Arm, Patient Position: Sitting, Cuff Size: Normal)   Pulse 95   Ht '5\' 3"'$  (1.6 m)   Wt 220 lb 3.2 oz (99.9 kg)   LMP 05/13/2019 Comment: patient had not had her period in a year and it came back in May and June 2020  BMI 39.01 kg/m    Physical Exam Constitutional:      Appearance: She is obese.  Cardiovascular:     Rate and Rhythm: Normal rate and regular rhythm.  Pulmonary:     Effort: Pulmonary effort is normal.     Breath sounds: Normal breath sounds.  Musculoskeletal:     Right lower leg: No edema.     Left lower leg: No edema.  Skin:    General: Skin is warm and dry.     Findings: No rash.  Neurological:     Mental Status: She is alert.  Psychiatric:        Mood and Affect: Mood normal.     Musculoskeletal Exam:  Shoulders full ROM no tenderness or swelling Elbows full ROM no tenderness or swelling Wrists full ROM no tenderness or swelling Fingers full ROM no tenderness or swelling Knees good ROM bilaterally no tenderness no effusions Ankles full ROM no tenderness or swelling  Investigation: No additional findings.  Imaging: MM 3D SCREEN BREAST BILATERAL  Result Date: 07/27/2021 CLINICAL DATA:  Screening. EXAM: DIGITAL SCREENING BILATERAL MAMMOGRAM WITH TOMOSYNTHESIS AND CAD TECHNIQUE: Bilateral screening digital craniocaudal and mediolateral oblique mammograms were obtained. Bilateral screening digital breast tomosynthesis was performed. The images were evaluated with computer-aided detection. COMPARISON:  Previous exam(s). ACR Breast Density Category a: The breast tissue is almost entirely fatty. FINDINGS: There are no findings suspicious for malignancy. IMPRESSION: No mammographic evidence of malignancy. A result letter of this screening mammogram will be mailed directly to the patient. RECOMMENDATION: Screening mammogram in one year. (Code:SM-B-01Y) BI-RADS CATEGORY  1: Negative. Electronically Signed   By:  Nolon Nations M.D.   On: 07/27/2021 14:10    Recent Labs: Lab Results  Component Value Date   WBC 7.2 05/03/2021   HGB 12.6 05/03/2021   PLT 338 05/03/2021   NA 138 07/11/2021   K 3.7 07/11/2021   CL 100 07/11/2021   CO2 30 07/11/2021   GLUCOSE 112 (H) 07/11/2021   BUN 12 07/11/2021   CREATININE 0.56 07/11/2021   BILITOT 0.4 06/29/2021   ALKPHOS 55 06/29/2021   AST 22 06/29/2021   ALT 35 06/29/2021   PROT 6.9 06/29/2021   ALBUMIN 4.0 06/29/2021   CALCIUM 9.5 07/11/2021   GFRAA >60 07/30/2019    Speciality Comments: No specialty comments available.  Procedures:  No procedures performed Allergies: Ozempic (0.25 or 0.5 mg-dose) [semaglutide(0.25 or 0.'5mg'$ -dos)], Actos [pioglitazone hydrochloride], Lisinopril, and Metformin and related   Assessment / Plan:     Visit Diagnoses: Positive ANA (antinuclear antibody) Arthralgia  Clinical findings still slightly nonspecific not a large improvement in her joint pains and skin rashes with hydroxychloroquine 400 mg daily consistent with lupus versus undifferentiated disease.  Recommend continuing hydroxychloroquine at this time.  As needed muscle relaxer is reasonable at nighttime.  No lab monitoring for medications needed today discussed need for ongoing ophthalmology exam.  Urticaria  Skin lesions also improved chronic urticaria associated with autoimmune disease can be responsive to hydroxychloroquine treatment no additional symptoms specific treatment is recommended at this time.    Orders: No orders of the defined types were placed in this encounter.  Meds ordered this encounter  Medications   hydroxychloroquine (PLAQUENIL) 200 MG tablet    Sig: Take 2 tablets (400 mg total) by mouth daily.    Dispense:  180 tablet    Refill:  2      Follow-Up Instructions: Return in about 6 months (around 01/31/2022) for SLE on HCq f/u 33mo.   CCollier Salina MD  Note - This record has been created using [employer]   Chart creation errors have been sought, but may not always  have been located. Such creation errors do not reflect on  the standard of medical care.

## 2021-08-02 ENCOUNTER — Ambulatory Visit: Payer: 59 | Admitting: Internal Medicine

## 2021-08-02 ENCOUNTER — Other Ambulatory Visit: Payer: Self-pay

## 2021-08-02 ENCOUNTER — Encounter: Payer: Self-pay | Admitting: Internal Medicine

## 2021-08-02 VITALS — BP 137/75 | HR 95 | Ht 63.0 in | Wt 220.2 lb

## 2021-08-02 DIAGNOSIS — L509 Urticaria, unspecified: Secondary | ICD-10-CM | POA: Diagnosis not present

## 2021-08-02 DIAGNOSIS — R768 Other specified abnormal immunological findings in serum: Secondary | ICD-10-CM

## 2021-08-02 DIAGNOSIS — M2550 Pain in unspecified joint: Secondary | ICD-10-CM | POA: Diagnosis not present

## 2021-08-02 MED ORDER — HYDROXYCHLOROQUINE SULFATE 200 MG PO TABS: 400.0000 mg | ORAL_TABLET | Freq: Every day | ORAL | 2 refills | Status: DC

## 2021-08-15 ENCOUNTER — Other Ambulatory Visit: Payer: Self-pay | Admitting: Family Medicine

## 2021-08-15 DIAGNOSIS — E7800 Pure hypercholesterolemia, unspecified: Secondary | ICD-10-CM

## 2021-09-06 ENCOUNTER — Ambulatory Visit (AMBULATORY_SURGERY_CENTER): Payer: 59

## 2021-09-06 ENCOUNTER — Other Ambulatory Visit: Payer: Self-pay

## 2021-09-06 ENCOUNTER — Encounter: Payer: Self-pay | Admitting: Gastroenterology

## 2021-09-06 VITALS — Ht 63.0 in | Wt 217.0 lb

## 2021-09-06 DIAGNOSIS — Z1211 Encounter for screening for malignant neoplasm of colon: Secondary | ICD-10-CM

## 2021-09-06 NOTE — Progress Notes (Signed)
Pre visit completed via phone call; Patient verified name, DOB, and address; No egg or soy allergy known to patient  No issues known to pt with past sedation with any surgeries or procedures Patient denies ever being told they had issues or difficulty with intubation  No FH of Malignant Hyperthermia Pt is not on diet pills Pt is not on home 02  Pt is not on blood thinners  Pt reports issues with constipation/diarrhea-patient reports she is taking daily Miralax to assist with constipation No A fib or A flutter  EMMI video via MyChart  Pt is fully vaccinate for Covid x 2; NO PA's for preps discussed with pt in PV today  Discussed with pt there will be an out-of-pocket cost for prep and that varies from $0 to 70 +  dollars  Due to the COVID-19 pandemic we are asking patients to follow certain guidelines.  Pt aware of COVID protocols and [facility] guidelines

## 2021-09-19 ENCOUNTER — Encounter: Payer: Self-pay | Admitting: Gastroenterology

## 2021-09-19 ENCOUNTER — Ambulatory Visit (AMBULATORY_SURGERY_CENTER): Payer: 59 | Admitting: Gastroenterology

## 2021-09-19 VITALS — BP 140/68 | HR 89 | Temp 98.0°F | Resp 14 | Ht 63.0 in | Wt 217.0 lb

## 2021-09-19 DIAGNOSIS — Z1211 Encounter for screening for malignant neoplasm of colon: Secondary | ICD-10-CM | POA: Diagnosis present

## 2021-09-19 DIAGNOSIS — Z800 Family history of malignant neoplasm of digestive organs: Secondary | ICD-10-CM

## 2021-09-19 MED ORDER — SODIUM CHLORIDE 0.9 % IV SOLN: 500.0000 mL | Freq: Once | INTRAVENOUS | Status: AC

## 2021-09-19 NOTE — Progress Notes (Signed)
[facility] History and Physical   Primary Care Physician:  Libby Maw, MD   Reason for Procedure:   Colon cancer screening  Plan:    Screening colonoscopy     HPI: Holly Ortiz is a 55 y.o. female undergoing screening colonoscopy.  She has chronic GI symptoms to include constipation and diarrhea.  Her mother was diagnosed with colon cancer at age 70.  She has had 2 normal colonoscopies, most recently in 2014.   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-I)    Anxiety    on meds   Asthma    childhood   Cataract 2015   bilateral   Constipation    takes Miralax daily   Depression    on meds   Diabetes mellitus    type II-on meds   Hyperlipidemia    on meds   Hypertension    on meds   Lupus ([place]) 2022   dx 02/2021   Seasonal allergies    Urticaria, chronic    UTI (lower urinary tract infection)     Past Surgical History:  Procedure Laterality Date   ABDOMINAL HYSTERECTOMY  08/07/2019   BREAST ENHANCEMENT SURGERY  1993   breast lift/reduction   CATARACT EXTRACTION, BILATERAL Bilateral 2014   COLONOSCOPY  2014   in [place] Bilateral 08/07/2019   Procedure: LAPAROSCOPIC ASSISTED VAGINAL HYSTERECTOMY WITH SALPINGO OOPHORECTOMY, possible abdominal hysterectomy;  Surgeon: Everlene Farrier, MD;  Location: [place];  Service: Gynecology;  Laterality: Bilateral;  possible abdominal hysterectomy pt is diabetic and hypertensive   SOFT TISSUE MASS EXCISION Right 2016   [place]   [place]    Prior to Admission medications   Medication Sig Start Date End Date Taking? Authorizing Provider  amLODipine (NORVASC) 10 MG tablet TAKE 1 TABLET ONCE DAILY 05/15/21  Yes Libby Maw, MD  atorvastatin (LIPITOR) 40 MG tablet TAKE 1 TABLET ONCE DAILY 08/15/21  Yes Libby Maw, MD   Cholecalciferol (VITAMIN D) 50 MCG (2000 UT) CAPS Take 2,000 Units by mouth daily.    Yes [provider]  cyclobenzaprine (FLEXERIL) 5 MG tablet Take 5 mg by mouth 3 (three) times daily as needed. 07/04/21  Yes [provider]  dapagliflozin propanediol (FARXIGA) 10 MG TABS tablet Take 1 tablet (10 mg total) by mouth daily. 07/14/21  Yes Elayne Snare, MD  escitalopram (LEXAPRO) 20 MG tablet Take 1 tablet (20 mg total) by mouth daily. 06/29/21  Yes Libby Maw, MD  fenofibrate (TRICOR) 145 MG tablet Take 1 tablet (145 mg total) by mouth daily. 02/08/21  Yes Elayne Snare, MD  hydroxychloroquine (PLAQUENIL) 200 MG tablet Take 2 tablets (400 mg total) by mouth daily. 08/02/21  Yes Rice, Resa Miner, MD  Insulin Pen Needle (BD PEN NEEDLE NANO 2ND GEN) 32G X 4 MM MISC Use four times daily to inject insulin. 07/30/19  Yes Elayne Snare, MD  insulin regular human CONCENTRATED (HUMULIN R U-500 KWIKPEN) 500 UNIT/ML KwikPen INJECT 50 to 65 units 4 times a day as directed 07/14/21  Yes Elayne Snare, MD  Melatonin 5 MG TABS Take 5 mg by mouth at bedtime.   Yes [provider]  montelukast (SINGULAIR) 10 MG tablet TAKE 1 TABLET BY MOUTH EVERYDAY AT BEDTIME 07/05/21  Yes Libby Maw, MD  polyethylene glycol (MIRALAX / GLYCOLAX) 17 g packet Take 17  g by mouth daily.   Yes [provider]  triamterene-hydrochlorothiazide (MAXZIDE-25) 37.5-25 MG tablet Take 1 tablet by mouth daily. 06/29/21  Yes Libby Maw, MD  vitamin B-12 (CYANOCOBALAMIN) 1000 MCG tablet Take 1,000 mcg by mouth daily.   Yes [provider]  zinc gluconate 50 MG tablet Take 50 mg by mouth daily.    Yes [provider]  acetaminophen (TYLENOL) 500 MG tablet Take 2 tablets (1,000 mg total) by mouth every 6 (six) hours as needed. 08/08/19   Everlene Farrier, MD    Current Outpatient Medications  Medication Sig Dispense Refill   amLODipine (NORVASC) 10 MG tablet TAKE 1 TABLET ONCE  DAILY 90 tablet 0   atorvastatin (LIPITOR) 40 MG tablet TAKE 1 TABLET ONCE DAILY 90 tablet 1   Cholecalciferol (VITAMIN D) 50 MCG (2000 UT) CAPS Take 2,000 Units by mouth daily.      cyclobenzaprine (FLEXERIL) 5 MG tablet Take 5 mg by mouth 3 (three) times daily as needed.     dapagliflozin propanediol (FARXIGA) 10 MG TABS tablet Take 1 tablet (10 mg total) by mouth daily. 90 tablet 1   escitalopram (LEXAPRO) 20 MG tablet Take 1 tablet (20 mg total) by mouth daily. 90 tablet 3   fenofibrate (TRICOR) 145 MG tablet Take 1 tablet (145 mg total) by mouth daily. 90 tablet 1   hydroxychloroquine (PLAQUENIL) 200 MG tablet Take 2 tablets (400 mg total) by mouth daily. 180 tablet 2   Insulin Pen Needle (BD PEN NEEDLE NANO 2ND GEN) 32G X 4 MM MISC Use four times daily to inject insulin. 200 each 11   insulin regular human CONCENTRATED (HUMULIN R U-500 KWIKPEN) 500 UNIT/ML KwikPen INJECT 50 to 65 units 4 times a day as directed 18 mL 2   Melatonin 5 MG TABS Take 5 mg by mouth at bedtime.     montelukast (SINGULAIR) 10 MG tablet TAKE 1 TABLET BY MOUTH EVERYDAY AT BEDTIME 90 tablet 0   polyethylene glycol (MIRALAX / GLYCOLAX) 17 g packet Take 17 g by mouth daily.     triamterene-hydrochlorothiazide (MAXZIDE-25) 37.5-25 MG tablet Take 1 tablet by mouth daily. 90 tablet 1   vitamin B-12 (CYANOCOBALAMIN) 1000 MCG tablet Take 1,000 mcg by mouth daily.     zinc gluconate 50 MG tablet Take 50 mg by mouth daily.      acetaminophen (TYLENOL) 500 MG tablet Take 2 tablets (1,000 mg total) by mouth every 6 (six) hours as needed. 60 tablet 0   Current Facility-Administered Medications  Medication Dose Route Frequency Provider Last Rate Last Admin   0.9 %  sodium chloride infusion  500 mL Intravenous Once Daryel November, MD        Allergies as of 09/19/2021 - Review Complete 09/19/2021  Allergen Reaction Noted   Ozempic (0.25 or 0.5 mg-dose) [semaglutide(0.25 or 0.5mg -dos)] Nausea And Vomiting 04/12/2020    Actos [pioglitazone hydrochloride] Other (See Comments) 10/17/2011   Lisinopril  01/23/2018   Metformin and related Diarrhea and Nausea And Vomiting 07/03/2018    Family History  Problem Relation Age of Onset   Colon polyps Mother 18   Colon cancer Mother 35   Hypertension Mother    Diabetes Father    Hypertension Father    COPD Father        Cause of death   Hypertension Sister    Diabetes Sister    Cancer Sister        Brain glioblastoma   Diabetes Sister  Hypertension Brother    Hyperlipidemia Other    Heart disease Other    Esophageal cancer Neg Hx    Stomach cancer Neg Hx    Rectal cancer Neg Hx     Social History   Socioeconomic History   Marital status: Married    Spouse name: Audelia Hives   Number of children: 4   Years of education: 13--1.5 years of college   Highest education level: 12th grade  Occupational History   Occupation: banking  Tobacco Use   Smoking status: Never   Smokeless tobacco: Never  Vaping Use   Vaping Use: Never used  Substance and Sexual Activity   Alcohol use: No   Drug use: No   Sexual activity: Yes    Birth control/protection: Surgical    Comment: BTL  Other Topics Concern   Not on file  Social History Narrative   Not on file   Social Determinants of Health   Financial Resource Strain: Not on file  Food Insecurity: Not on file  Transportation Needs: Not on file  Physical Activity: Not on file  Stress: Not on file  Social Connections: Not on file  Intimate Partner Violence: Not on file    Review of Systems:  All other review of systems negative except as mentioned in the HPI.  Physical Exam: Vital signs BP 136/75   Pulse 98   Temp 98 F (36.7 C) (Temporal)   Ht 5\' 3"  (1.6 m)   Wt 217 lb (98.4 kg)   LMP 05/13/2019 Comment: patient had not had her period in a year and it came back in May and June 2020  SpO2 98%   BMI 38.44 kg/m   General:   Alert,  Well-developed, well-nourished, pleasant and cooperative in  NAD Lungs:  Clear throughout to auscultation.   Heart:  Regular rate and rhythm; no murmurs, clicks, rubs,  or gallops. Abdomen:  Soft, nontender and nondistended. Normal bowel sounds.   Neuro/Psych:  Normal mood and affect. A and O x 3   Maleni Seyer E. Candis Schatz, MD [facility]

## 2021-09-19 NOTE — Progress Notes (Signed)
To PACU, VSS. Report to [email or web address] 

## 2021-09-19 NOTE — Patient Instructions (Signed)
Information on hemorrhoids and diverticulosis given to you today.  Resume previous diet and medications.  Repeat colonoscopy in 5 years for screening purposes.   YOU HAD AN ENDOSCOPIC PROCEDURE TODAY AT [facility]:   Refer to the procedure report that was given to you for any specific questions about what was found during the examination.  If the procedure report does not answer your questions, please call your gastroenterologist to clarify.  If you requested that your care partner not be given the details of your procedure findings, then the procedure report has been included in a sealed envelope for you to review at your convenience later.  YOU SHOULD EXPECT: Some feelings of bloating in the abdomen. Passage of more gas than usual.  Walking can help get rid of the air that was put into your GI tract during the procedure and reduce the bloating. If you had a lower endoscopy (such as a colonoscopy or flexible sigmoidoscopy) you may notice spotting of blood in your stool or on the toilet paper. If you underwent a bowel prep for your procedure, you may not have a normal bowel movement for a few days.  Please Note:  You might notice some irritation and congestion in your nose or some drainage.  This is from the oxygen used during your procedure.  There is no need for concern and it should clear up in a day or so.  SYMPTOMS TO REPORT IMMEDIATELY:  Following lower endoscopy (colonoscopy or flexible sigmoidoscopy):  Excessive amounts of blood in the stool  Significant tenderness or worsening of abdominal pains  Swelling of the abdomen that is new, acute  Fever of 100F or higher   For urgent or emergent issues, a gastroenterologist can be reached at any hour by calling [phone]. Do not use MyChart messaging for urgent concerns.    DIET:  We do recommend a small meal at first, but then you may proceed to your regular diet.  Drink plenty of fluids but you should avoid  alcoholic beverages for 24 hours.  ACTIVITY:  You should plan to take it easy for the rest of today and you should NOT DRIVE or use heavy machinery until tomorrow (because of the sedation medicines used during the test).    FOLLOW UP: Our staff will call the number listed on your records 48-72 hours following your procedure to check on you and address any questions or concerns that you may have regarding the information given to you following your procedure. If we do not reach you, we will leave a message.  We will attempt to reach you two times.  During this call, we will ask if you have developed any symptoms of COVID 19. If you develop any symptoms (ie: fever, flu-like symptoms, shortness of breath, cough etc.) before then, please call [phone].  If you test positive for Covid 19 in the 2 weeks post procedure, please call and report this information to [place].    If any biopsies were taken you will be contacted by phone or by letter within the next 1-3 weeks.  Please call us at [phone] if you have not heard about the biopsies in 3 weeks.    SIGNATURES/CONFIDENTIALITY: You and/or your care partner have signed paperwork which will be entered into your electronic medical record.  These signatures attest to the fact that that the information above on your After Visit Summary has been reviewed and is understood.  Full responsibility of the confidentiality of this discharge  information lies with you and/or your care-partner.

## 2021-09-19 NOTE — Progress Notes (Signed)
Pt's states no medical or surgical changes since previsit or office visit. 

## 2021-09-19 NOTE — Op Note (Signed)
[place] Patient Name: Holly Ortiz Procedure Date: 09/19/2021 11:27 AM MRN: [id number] Endoscopist: Nicki Reaper E. Candis Schatz , MD Age: 55 Referring MD:  Date of Birth: 01-31-1966 Gender: Female Account #: [id number] Procedure:                Colonoscopy Indications:              Screening in patient at increased risk: Colorectal                            cancer in mother before age 65 Medicines:                Monitored Anesthesia Care Procedure:                Pre-Anesthesia Assessment:                           - Prior to the procedure, a History and Physical                            was performed, and patient medications and                            allergies were reviewed. The patient's tolerance of                            previous anesthesia was also reviewed. The risks                            and benefits of the procedure and the sedation                            options and risks were discussed with the patient.                            All questions were answered, and informed consent                            was obtained. Prior Anticoagulants: The patient has                            taken no previous anticoagulant or antiplatelet                            agents. ASA Grade Assessment: III - A patient with                            severe systemic disease. After reviewing the risks                            and benefits, the patient was deemed in                            satisfactory condition to undergo the procedure.  After obtaining informed consent, the colonoscope                            was passed under direct vision. Throughout the                            procedure, the patient's blood pressure, pulse, and                            oxygen saturations were monitored continuously. The                            Olympus CF-HQ190L ([id number]) Colonoscope was                            introduced through  the anus and advanced to the the                            cecum, identified by appendiceal orifice and                            ileocecal valve. The colonoscopy was performed                            without difficulty. The patient tolerated the                            procedure well. The quality of the bowel                            preparation was adequate. The ileocecal valve,                            appendiceal orifice, and rectum were photographed. Scope In: 11:34:44 AM Scope Out: 11:51:08 AM Scope Withdrawal Time: 0 hours 11 minutes 20 seconds  Total Procedure Duration: 0 hours 16 minutes 24 seconds  Findings:                 Skin tags were found on perianal exam.                           The digital rectal exam was normal. Pertinent                            negatives include normal sphincter tone and no                            palpable rectal lesions.                           A few small and large-mouthed diverticula were                            found in the transverse colon and ascending colon.  The exam was otherwise normal throughout the                            examined colon.                           Non-bleeding internal hemorrhoids were found during                            retroflexion. The hemorrhoids were Grade I                            (internal hemorrhoids that do not prolapse).                           No additional abnormalities were found on                            retroflexion. Complications:            No immediate complications. Estimated Blood Loss:     Estimated blood loss: none. Impression:               - Perianal skin tags found on perianal exam.                           - Diverticulosis in the transverse colon and in the                            ascending colon.                           - Non-bleeding internal hemorrhoids.                           - No specimens collected. Recommendation:            - Patient has a contact number available for                            emergencies. The signs and symptoms of potential                            delayed complications were discussed with the                            patient. Return to normal activities tomorrow.                            Written discharge instructions were provided to the                            patient.                           - Resume previous diet.                           -  Continue present medications.                           - Repeat colonoscopy in 5 years for screening                            purposes. Hai Grabe E. Candis Schatz, MD 09/19/2021 11:55:27 AM This report has been signed electronically.

## 2021-09-21 ENCOUNTER — Telehealth: Payer: Self-pay | Admitting: *Deleted

## 2021-09-21 ENCOUNTER — Telehealth: Payer: Self-pay

## 2021-09-21 NOTE — Telephone Encounter (Signed)
Left message on follow up call. 

## 2021-09-21 NOTE — Telephone Encounter (Signed)
  Follow up Call-  Call back number 09/19/2021  Post procedure Call Back phone  # [phone]  Permission to leave phone message Yes  Some recent data might be hidden    LMOM to call back with any questions or concerns.  Also, call back if patient has developed fever, respiratory issues or been dx with COVID or had any family members or close contacts diagnosed since her procedure.

## 2021-10-04 ENCOUNTER — Ambulatory Visit: Payer: 59 | Admitting: Family Medicine

## 2021-10-11 ENCOUNTER — Other Ambulatory Visit: Payer: 59

## 2021-10-14 ENCOUNTER — Other Ambulatory Visit: Payer: Self-pay | Admitting: Family Medicine

## 2021-10-14 ENCOUNTER — Ambulatory Visit: Payer: 59 | Admitting: Endocrinology

## 2021-10-14 DIAGNOSIS — I10 Essential (primary) hypertension: Secondary | ICD-10-CM

## 2021-11-12 ENCOUNTER — Other Ambulatory Visit: Payer: Self-pay | Admitting: Endocrinology

## 2021-12-12 ENCOUNTER — Other Ambulatory Visit: Payer: Self-pay | Admitting: Endocrinology

## 2021-12-12 DIAGNOSIS — E1165 Type 2 diabetes mellitus with hyperglycemia: Secondary | ICD-10-CM

## 2021-12-12 DIAGNOSIS — Z794 Long term (current) use of insulin: Secondary | ICD-10-CM

## 2021-12-30 ENCOUNTER — Other Ambulatory Visit: Payer: Self-pay | Admitting: Family Medicine

## 2021-12-30 DIAGNOSIS — I10 Essential (primary) hypertension: Secondary | ICD-10-CM

## 2022-01-12 ENCOUNTER — Other Ambulatory Visit: Payer: Self-pay | Admitting: Family Medicine

## 2022-01-12 DIAGNOSIS — I10 Essential (primary) hypertension: Secondary | ICD-10-CM

## 2022-01-16 NOTE — Telephone Encounter (Signed)
Lvm 01/16/22 to try and schedule

## 2022-01-30 NOTE — Progress Notes (Signed)
Office Visit Note  Patient: Holly Ortiz             Date of Birth: January 18, 1966           MRN: [id number]             PCP: Libby Maw, MD Referring: Libby Maw,* Visit Date: 01/31/2022   Subjective:  Follow-up (Doing good)   History of Present Illness: Holly Ortiz is a 56 y.o. female here for follow up for inflammatory arthritis and chronic urticaria on HCQ 400 mg daily. She had a significant flare up of symptoms around Thanksgiving that resolved after a couple weeks without specific intervention. She takes advil as needed when joint pain and swelling are worse and when really bad sometimes flexeril. She had another shorter episode of symptoms for about 4 days earlier this year. Most common joint swelling lately is at the 3rd knuckle of each hand, knee swelling has been less common. She did not have many episodes of hives outside of these flares. She is starting to have nasal congestion and hoarseness of voice she thinks is seasonal allergies returning.  Previous HPI 08/02/21 Holly Ortiz is a 56 y.o. female here for follow up for inflammatory arthritis and chronic urticarial rashes on hydroxychloroquine 400 mg PO daily. Due to low back and knee pain that seemed noninflammatory also recommended trial of oral muscle relaxant as needed at night.  So far she feels a very good improvement in her overall symptoms.  She has not had any recurrence of skin rash since at least 2 months ago.  Joint pains are doing well.  She still has some of the fatigue and concentration difficulty though this is also partially better.   02/22/21 Holly Ortiz is a 56 y.o. female here for evaluation of positive ANA with chronic joint pains, fatigue, and intermittent hives for years. She describes symptoms starting since around 2007. She has had pain in joints and muscles at various areas to some extent most of the time. She also developed a problem with recurrent urticaria rashes at  multiple areas, most often after increased stress or other provocative events such as infection and antibiotic use. Since that time she has had workup with allergy and immunology clinics and dermatology clinic with no specific cause able to be found. A more recent symptom is change in sensation in her legs and feet. She notices swelling and discomfort after prolonged standing or prolonged sitting. She had a few episodes of becoming dizzy and unstable after standing and a few falls.   Labs reviewed 01/2021 ANA 1:80 homogenous, speckled CBC wnl TSH wnl CK wnl CRP wnl   Review of Systems  Constitutional:  Negative for fatigue.  HENT:  Positive for mouth dryness.   Eyes:  Negative for dryness.  Respiratory:  Negative for shortness of breath.   Cardiovascular:  Positive for swelling in legs/feet.  Gastrointestinal:  Positive for constipation.  Endocrine: Positive for increased urination.  Genitourinary:  Negative for difficulty urinating.  Musculoskeletal:  Positive for joint pain, joint pain, joint swelling, morning stiffness and muscle tenderness.  Skin:  Positive for rash.  Allergic/Immunologic: Negative for susceptible to infections.  Neurological:  Positive for numbness.  Hematological:  Negative for bruising/bleeding tendency.  Psychiatric/Behavioral:  Negative for sleep disturbance.    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  Iron deficiency 07/07/2020   Left low back pain 01/14/2020   Anemia 12/02/2019   Lightheadedness 12/02/2019   Menorrhagia 08/07/2019   Abnormal uterine bleeding 08/07/2019   Anxiety and depression 07/03/2018   DKA (diabetic ketoacidoses) 03/28/2018   Tachycardia 03/28/2018   Urticaria 03/28/2018   Hyperosmolar hyperglycemia 01/04/2018   AKI (acute kidney injury) ([place]) 01/04/2018   Hyperglycemia 01/03/2018    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-I)    Asthma    Type 2 diabetes mellitus with complication, with long-term current use of insulin (HCC)    Elevated cholesterol    Essential hypertension     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-I)    Anxiety    on meds   Asthma    childhood   Cataract 2015   bilateral   Constipation    takes Miralax daily   Depression    on meds   Diabetes mellitus    type II-on meds   Hyperlipidemia    on meds   Hypertension    on meds   Lupus ([place]) 2022   dx 02/2021   Seasonal allergies    Urticaria, chronic    UTI (lower urinary tract infection)     Family History  Problem Relation Age of Onset   Colon polyps Mother 51   Colon cancer Mother 73   Hypertension Mother    Diabetes Father    Hypertension Father    COPD Father        Cause of death   Hypertension Sister    Diabetes Sister    Cancer Sister        Brain glioblastoma   Diabetes Sister    Hypertension Brother    Hyperlipidemia Other    Heart disease Other    Esophageal cancer Neg Hx    Stomach cancer Neg Hx    Rectal cancer Neg Hx    Past Surgical History:  Procedure Laterality Date   ABDOMINAL HYSTERECTOMY  08/07/2019   BREAST ENHANCEMENT SURGERY  1993   breast lift/reduction   CATARACT EXTRACTION, BILATERAL Bilateral 2014   COLONOSCOPY  2014   in [place] Bilateral 08/07/2019   Procedure: LAPAROSCOPIC ASSISTED VAGINAL HYSTERECTOMY WITH SALPINGO OOPHORECTOMY, possible abdominal hysterectomy;  Surgeon: Everlene Farrier, MD;  Location: [facility];  Service: Gynecology;  Laterality: Bilateral;  possible abdominal hysterectomy pt is diabetic and hypertensive   SOFT TISSUE MASS EXCISION Right 2016   [place] area   [place]   tummy tuck  1993   Social History   Social History Narrative   Not on file   Immunization  History  Administered Date(s) Administered   DTaP 07/04/2009   Influenza,inj,Quad PF,6+ Mos 01/05/2018, 08/28/2018, 09/05/2019, 10/05/2020   PFIZER(Purple Top)SARS-COV-2 Vaccination 02/03/2020, 03/02/2020   Pneumococcal Polysaccharide-23 01/05/2018   Tdap 12/04/2012   Zoster Recombinat (Shingrix) 06/29/2021     Objective: Vital Signs: BP 133/72 (BP Location: Left Arm, Patient Position: Sitting, Cuff Size: Normal)    Pulse (!) 105    Resp 16    Ht 5\' 2"  (1.575 m)    Wt 216 lb (98 kg)    LMP 05/13/2019 Comment: patient had not had her period in a year and it came back in May and June 2020   BMI 39.51 kg/m    Physical Exam Constitutional:      Appearance: She is obese.  HENT:  Mouth/Throat:     Comments: Slightly hoarse voice Eyes:     Conjunctiva/sclera: Conjunctivae normal.  Cardiovascular:     Rate and Rhythm: Normal rate and regular rhythm.  Pulmonary:     Effort: Pulmonary effort is normal.     Breath sounds: Normal breath sounds.  Musculoskeletal:     Right lower leg: No edema.     Left lower leg: No edema.  Lymphadenopathy:     Cervical: No cervical adenopathy.  Skin:    General: Skin is warm and dry.  Neurological:     Mental Status: She is alert.  Psychiatric:        Mood and Affect: Mood normal.     Musculoskeletal Exam:  Shoulders full ROM no tenderness or swelling Elbows full ROM no tenderness or swelling Wrists full ROM no tenderness or swelling Fingers full ROM no tenderness or swelling Knees full ROM no tenderness or swelling Ankles full ROM no tenderness or swelling   Investigation: No additional findings.  Imaging: No results found.  Recent Labs: Lab Results  Component Value Date   WBC 7.2 05/03/2021   HGB 12.6 05/03/2021   PLT 338 05/03/2021   NA 138 07/11/2021   K 3.7 07/11/2021   CL 100 07/11/2021   CO2 30 07/11/2021   GLUCOSE 112 (H) 07/11/2021   BUN 12 07/11/2021   CREATININE 0.56 07/11/2021   BILITOT 0.4 06/29/2021   ALKPHOS  55 06/29/2021   AST 22 06/29/2021   ALT 35 06/29/2021   PROT 6.9 06/29/2021   ALBUMIN 4.0 06/29/2021   CALCIUM 9.5 07/11/2021   GFRAA >60 07/30/2019    Speciality Comments: No specialty comments available.  Procedures:  No procedures performed Allergies: Ozempic (0.25 or 0.5 mg-dose) [semaglutide(0.25 or 0.5mg -dos)], Actos [pioglitazone hydrochloride], Lisinopril, and Metformin and related   Assessment / Plan:     Visit Diagnoses: Undifferentiated connective tissue disease ([place]) - Plan: CBC with Differential/Platelet, Sedimentation rate, Anti-DNA antibody, double-stranded  Doing well overall at this time.  She had 2 episodes of increased symptoms but nothing very active today. Reported symptoms including some hand and finger swelling but nothing on exam right now. Checking CBC, sed rate, and dsDNA for disease activity assessment. Plan to continue HCQ 400 mg daily.  Urticaria  Doing well outside of limited episodes. She has not had an increase with seasonal allergies returning so far. Remains on singulair which is long term and helpful.  Orders: Orders Placed This Encounter  Procedures   CBC with Differential/Platelet   Sedimentation rate   Anti-DNA antibody, double-stranded   No orders of the defined types were placed in this encounter.    Follow-Up Instructions: Return in about 6 months (around 07/31/2022) for UCTD on HCQ f/u 13mos.   Collier Salina, MD  Note - This record has been created using [employer].  Chart creation errors have been sought, but may not always  have been located. Such creation errors do not reflect on  the standard of medical care.

## 2022-01-31 ENCOUNTER — Ambulatory Visit: Payer: 59 | Admitting: Internal Medicine

## 2022-01-31 ENCOUNTER — Other Ambulatory Visit: Payer: Self-pay

## 2022-01-31 ENCOUNTER — Encounter: Payer: Self-pay | Admitting: Internal Medicine

## 2022-01-31 VITALS — BP 133/72 | HR 105 | Resp 16 | Ht 62.0 in | Wt 216.0 lb

## 2022-01-31 DIAGNOSIS — M359 Systemic involvement of connective tissue, unspecified: Secondary | ICD-10-CM

## 2022-01-31 DIAGNOSIS — L509 Urticaria, unspecified: Secondary | ICD-10-CM | POA: Diagnosis not present

## 2022-02-04 LAB — CBC WITH DIFFERENTIAL/PLATELET
Basophils Absolute: 0 10*3/uL (ref 0.0–0.2)
Basos: 1 %
EOS (ABSOLUTE): 0.2 10*3/uL (ref 0.0–0.4)
Eos: 2 %
Hematocrit: 40.1 % (ref 34.0–46.6)
Hemoglobin: 12.7 g/dL (ref 11.1–15.9)
Immature Grans (Abs): 0.1 10*3/uL (ref 0.0–0.1)
Immature Granulocytes: 1 %
Lymphocytes Absolute: 2 10*3/uL (ref 0.7–3.1)
Lymphs: 27 %
MCH: 25.1 pg — ABNORMAL LOW (ref 26.6–33.0)
MCHC: 31.7 g/dL (ref 31.5–35.7)
MCV: 79 fL (ref 79–97)
Monocytes Absolute: 0.7 10*3/uL (ref 0.1–0.9)
Monocytes: 9 %
Neutrophils Absolute: 4.6 10*3/uL (ref 1.4–7.0)
Neutrophils: 60 %
Platelets: 351 10*3/uL (ref 150–450)
RBC: 5.05 x10E6/uL (ref 3.77–5.28)
RDW: 13.5 % (ref 11.7–15.4)
WBC: 7.6 10*3/uL (ref 3.4–10.8)

## 2022-02-04 LAB — SEDIMENTATION RATE: Sed Rate: 28 mm/hr (ref 0–40)

## 2022-02-04 LAB — ANTI-DNA ANTIBODY, DOUBLE-STRANDED: dsDNA Ab: 31 IU/mL — ABNORMAL HIGH (ref 0–9)

## 2022-02-07 NOTE — Progress Notes (Signed)
Lab results look good no problems with hydroxychloroquine and her sed rate and dsDNA are lower than last year. No changes needed at this time.

## 2022-02-17 ENCOUNTER — Other Ambulatory Visit: Payer: Self-pay | Admitting: Family Medicine

## 2022-02-17 DIAGNOSIS — E7800 Pure hypercholesterolemia, unspecified: Secondary | ICD-10-CM

## 2022-03-23 ENCOUNTER — Other Ambulatory Visit: Payer: Self-pay | Admitting: Family Medicine

## 2022-03-23 ENCOUNTER — Encounter: Payer: Self-pay | Admitting: Family Medicine

## 2022-03-23 ENCOUNTER — Ambulatory Visit: Payer: 59 | Admitting: Family Medicine

## 2022-03-23 DIAGNOSIS — F32A Depression, unspecified: Secondary | ICD-10-CM

## 2022-03-23 DIAGNOSIS — I10 Essential (primary) hypertension: Secondary | ICD-10-CM | POA: Diagnosis not present

## 2022-03-23 DIAGNOSIS — E7800 Pure hypercholesterolemia, unspecified: Secondary | ICD-10-CM | POA: Diagnosis not present

## 2022-03-23 DIAGNOSIS — F419 Anxiety disorder, unspecified: Secondary | ICD-10-CM

## 2022-03-23 DIAGNOSIS — L509 Urticaria, unspecified: Secondary | ICD-10-CM | POA: Diagnosis not present

## 2022-03-23 MED ORDER — TRIAMTERENE-HCTZ 37.5-25 MG PO TABS: 1.0000 | ORAL_TABLET | Freq: Every day | ORAL | 1 refills | Status: DC

## 2022-03-23 MED ORDER — ATORVASTATIN CALCIUM 40 MG PO TABS: 40.0000 mg | ORAL_TABLET | Freq: Every day | ORAL | 2 refills | Status: DC

## 2022-03-23 MED ORDER — MONTELUKAST SODIUM 10 MG PO TABS: ORAL_TABLET | ORAL | 1 refills | Status: DC

## 2022-03-23 MED ORDER — ESCITALOPRAM OXALATE 20 MG PO TABS: 20.0000 mg | ORAL_TABLET | Freq: Every day | ORAL | 3 refills | Status: DC

## 2022-03-23 MED ORDER — AMLODIPINE BESYLATE 10 MG PO TABS: 10.0000 mg | ORAL_TABLET | Freq: Every day | ORAL | 1 refills | Status: DC

## 2022-03-23 NOTE — Progress Notes (Signed)
? ?Established Patient Office Visit ? ?Subjective   ?Patient ID: Holly Ortiz, female    DOB: 12-13-1965  Age: 56 y.o. MRN: [id number] ? ?Chief Complaint  ?Patient presents with  ? Medication Refill  ?  Refill/follow up on medication, no concerns. Patient not fasting.   ? ? ?Medication Refill ?Pertinent negatives include no abdominal pain, chest pain, chills, diaphoresis, myalgias or weakness.  for follow-up of hypertension, elevated cholesterol, anxiety depression and chronic urticaria.  Urticaria has responded to daily montelukast.  Blood pressure treated with amlodipine Maxide.  Patient doing well with Lexapro.  Would like to continue it.  Continues fenofibrate for hyperlipidemia.  She is nonfasting today.  He is here with her husband and youngest granddaughter. ? ? ? ?Review of Systems  ?Constitutional:  Negative for chills, diaphoresis, malaise/fatigue and weight loss.  ?HENT: Negative.    ?Eyes: Negative.  Negative for blurred vision and double vision.  ?Cardiovascular:  Negative for chest pain.  ?Gastrointestinal:  Negative for abdominal pain.  ?Genitourinary: Negative.   ?Musculoskeletal:  Negative for falls and myalgias.  ?Neurological:  Negative for speech change, loss of consciousness and weakness.  ?Psychiatric/Behavioral: Negative.    ? ?  ?Objective:  ?  ? ?BP 124/66 (BP Location: Right Arm, Patient Position: Sitting, Cuff Size: Large)   Pulse 95   Temp (!) 97 ?F (36.1 ?C) (Temporal)   Ht '5\' 2"'$  (1.575 m)   Wt 222 lb (100.7 kg)   LMP 05/13/2019 Comment: patient had not had her period in a year and it came back in May and June 2020  SpO2 96%   BMI 40.60 kg/m?  ? ? ?Physical Exam ?Constitutional:   ?   General: She is not in acute distress. ?   Appearance: Normal appearance. She is not ill-appearing, toxic-appearing or diaphoretic.  ?HENT:  ?   Head: Normocephalic and atraumatic.  ?   Right Ear: External ear normal.  ?   Left Ear: External ear normal.  ?   Mouth/Throat:  ?   Mouth: Mucous  membranes are moist.  ?   Pharynx: Oropharynx is clear. No oropharyngeal exudate or posterior oropharyngeal erythema.  ?Eyes:  ?   General: No scleral icterus.    ?   Right eye: No discharge.     ?   Left eye: No discharge.  ?   Extraocular Movements: Extraocular movements intact.  ?   Conjunctiva/sclera: Conjunctivae normal.  ?   Pupils: Pupils are equal, round, and reactive to light.  ?Cardiovascular:  ?   Rate and Rhythm: Normal rate and regular rhythm.  ?Pulmonary:  ?   Effort: Pulmonary effort is normal. No respiratory distress.  ?   Breath sounds: Normal breath sounds.  ?Abdominal:  ?   General: Bowel sounds are normal.  ?   Tenderness: There is no abdominal tenderness. There is no guarding.  ?Musculoskeletal:  ?   Cervical back: No rigidity or tenderness.  ?Skin: ?   General: Skin is warm and dry.  ?Neurological:  ?   Mental Status: She is alert and oriented to person, place, and time.  ?Psychiatric:     ?   Mood and Affect: Mood normal.     ?   Behavior: Behavior normal.  ? ?Diabetic Foot Exam - Simple   ?Simple Foot Form ?Diabetic Foot exam was performed with the following findings: Yes 03/23/2022  3:55 PM  ?Visual Inspection ?No deformities, no ulcerations, no other skin breakdown bilaterally: Yes ?Sensation Testing ?Intact to touch  and monofilament testing bilaterally: Yes ?Pulse Check ?Posterior Tibialis and Dorsalis pulse intact bilaterally: Yes ?Comments ?  ?  ? ?No results found for any visits on 03/23/22. ? ? ? ?The 10-year ASCVD risk score (Arnett DK, et al., 2019) is: 4.9% ? ?  ?Assessment & Plan:  ? ?Problem List Items Addressed This Visit   ? ?  ? Cardiovascular and Mediastinum  ? Essential hypertension  ? Relevant Medications  ? amLODipine (NORVASC) 10 MG tablet  ? atorvastatin (LIPITOR) 40 MG tablet  ? triamterene-hydrochlorothiazide (MAXZIDE-25) 37.5-25 MG tablet  ? Other Relevant Orders  ? Comprehensive metabolic panel  ? Urinalysis, Routine w reflex microscopic  ? CBC  ?  ? Musculoskeletal  and Integument  ? Urticaria  ? Relevant Medications  ? montelukast (SINGULAIR) 10 MG tablet  ?  ? Other  ? Elevated cholesterol  ? Relevant Medications  ? amLODipine (NORVASC) 10 MG tablet  ? atorvastatin (LIPITOR) 40 MG tablet  ? triamterene-hydrochlorothiazide (MAXZIDE-25) 37.5-25 MG tablet  ? Other Relevant Orders  ? Comprehensive metabolic panel  ? Lipid panel  ? Anxiety and depression  ? Relevant Medications  ? escitalopram (LEXAPRO) 20 MG tablet  ? ? ?Return in about 6 months (around 09/22/2022), or Return fasting for above ordered blood work..  ? ?Encouraged follow-up with endocrinology.  Will need ACE or ARB for renal protection. ?Libby Maw, MD ? ?

## 2022-03-27 ENCOUNTER — Telehealth: Payer: Self-pay | Admitting: Internal Medicine

## 2022-03-27 ENCOUNTER — Other Ambulatory Visit: Payer: Self-pay | Admitting: *Deleted

## 2022-03-27 DIAGNOSIS — Z79899 Other long term (current) drug therapy: Secondary | ICD-10-CM

## 2022-03-27 DIAGNOSIS — H269 Unspecified cataract: Secondary | ICD-10-CM

## 2022-03-27 NOTE — Telephone Encounter (Signed)
Patient called the office stating she has cataracts and she is having a heard time finding an eye doctor. Patient states Dr. Benjamine Mola told her to call the office if she was having troubles to get in with an eye doctor and he would refer her somewhere. Patient would like a referral sent asap. ? ?

## 2022-03-27 NOTE — Telephone Encounter (Signed)
She needs referral to ophthalmology for hydroxychloroquine toxicity monitoring and for cataracts, I am okay whichever local office can see her in a timely fashion

## 2022-03-27 NOTE — Telephone Encounter (Signed)
LMOM, referral placed for Dr. Zenia Resides office, pending appt. ?

## 2022-03-28 ENCOUNTER — Other Ambulatory Visit (INDEPENDENT_AMBULATORY_CARE_PROVIDER_SITE_OTHER): Payer: 59

## 2022-03-28 DIAGNOSIS — E1165 Type 2 diabetes mellitus with hyperglycemia: Secondary | ICD-10-CM

## 2022-03-28 DIAGNOSIS — Z794 Long term (current) use of insulin: Secondary | ICD-10-CM

## 2022-03-28 DIAGNOSIS — E7800 Pure hypercholesterolemia, unspecified: Secondary | ICD-10-CM | POA: Diagnosis not present

## 2022-03-28 DIAGNOSIS — I10 Essential (primary) hypertension: Secondary | ICD-10-CM | POA: Diagnosis not present

## 2022-03-28 LAB — COMPREHENSIVE METABOLIC PANEL
ALT: 25 U/L (ref 0–35)
AST: 15 U/L (ref 0–37)
Albumin: 4.2 g/dL (ref 3.5–5.2)
Alkaline Phosphatase: 67 U/L (ref 39–117)
BUN: 13 mg/dL (ref 6–23)
CO2: 31 mEq/L (ref 19–32)
Calcium: 9.6 mg/dL (ref 8.4–10.5)
Chloride: 98 mEq/L (ref 96–112)
Creatinine, Ser: 0.53 mg/dL (ref 0.40–1.20)
GFR: 104.27 mL/min (ref >60.00)
Glucose, Bld: 267 mg/dL — ABNORMAL HIGH (ref 70–99)
Potassium: 3.4 mEq/L — ABNORMAL LOW (ref 3.5–5.1)
Sodium: 137 mEq/L (ref 135–145)
Total Bilirubin: 0.4 mg/dL (ref 0.2–1.2)
Total Protein: 7.4 g/dL (ref 6.0–8.3)

## 2022-03-28 LAB — URINALYSIS, ROUTINE W REFLEX MICROSCOPIC
Bilirubin Urine: NEGATIVE
Hgb urine dipstick: NEGATIVE
Ketones, ur: NEGATIVE
Leukocytes,Ua: NEGATIVE
Nitrite: NEGATIVE
RBC / HPF: NONE SEEN (ref 0–?)
Specific Gravity, Urine: 1.01 (ref 1.000–1.030)
Total Protein, Urine: NEGATIVE
Urine Glucose: >=1000 — AB
Urobilinogen, UA: 0.2 (ref 0.0–1.0)
pH: 6 (ref 5.0–8.0)

## 2022-03-28 LAB — BASIC METABOLIC PANEL
BUN: 13 mg/dL (ref 6–23)
CO2: 31 mEq/L (ref 19–32)
Calcium: 9.6 mg/dL (ref 8.4–10.5)
Chloride: 98 mEq/L (ref 96–112)
Creatinine, Ser: 0.53 mg/dL (ref 0.40–1.20)
GFR: 104.27 mL/min (ref >60.00)
Glucose, Bld: 267 mg/dL — ABNORMAL HIGH (ref 70–99)
Potassium: 3.4 mEq/L — ABNORMAL LOW (ref 3.5–5.1)
Sodium: 137 mEq/L (ref 135–145)

## 2022-03-28 LAB — LIPID PANEL
Cholesterol: 149 mg/dL (ref 0–200)
HDL: 37.5 mg/dL — ABNORMAL LOW (ref >39.00)
NonHDL: 111.58
Total CHOL/HDL Ratio: 4
Triglycerides: 239 mg/dL — ABNORMAL HIGH (ref 0.0–149.0)
VLDL: 47.8 mg/dL — ABNORMAL HIGH (ref 0.0–40.0)

## 2022-03-28 LAB — LDL CHOLESTEROL, DIRECT: Direct LDL: 88 mg/dL

## 2022-03-28 LAB — HEMOGLOBIN A1C: Hgb A1c MFr Bld: 11.5 % — ABNORMAL HIGH (ref 4.6–6.5)

## 2022-04-03 HISTORY — PX: CATARACT EXTRACTION, BILATERAL: SHX1313

## 2022-04-13 ENCOUNTER — Other Ambulatory Visit: Payer: Self-pay | Admitting: Endocrinology

## 2022-04-13 DIAGNOSIS — E1165 Type 2 diabetes mellitus with hyperglycemia: Secondary | ICD-10-CM

## 2022-05-15 ENCOUNTER — Encounter: Payer: Self-pay | Admitting: Internal Medicine

## 2022-05-15 ENCOUNTER — Other Ambulatory Visit: Payer: Self-pay | Admitting: Internal Medicine

## 2022-05-15 ENCOUNTER — Other Ambulatory Visit: Payer: Self-pay | Admitting: Endocrinology

## 2022-05-15 DIAGNOSIS — M545 Low back pain, unspecified: Secondary | ICD-10-CM

## 2022-07-20 NOTE — Progress Notes (Signed)
Office Visit Note  Patient: Holly Ortiz             Date of Birth: [birth year]           MRN: [id number]             PCP: Libby Maw, MD Referring: Libby Maw,* Visit Date: 07/31/2022   Subjective:   History of Present Illness: Holly Ortiz is a 56 y.o. female here for follow up for UCTD with symptoms of inflammatory arthritis and chronic urticaria on HCQ 400 mg daily. She has generally been doing well without severe flare ups. She has followed up with her ophthalmologist but not remembered to specifically address her HCQ screening exam. Since last week she is having new left flank pain radiating around the side somewhat along bottom of ribs. She does not recall any preceding illness or injury. She does notice increased urinary frequency during this time. No dysuria or gross hematuria. She has a history of a kidney stone once about 30 years ago during pregnancy and last known UTI about 10 years ago. She has a new dog and has a lot of new scratches from this.  Previous HPI 01/31/2022 Holly Ortiz is a 56 y.o. female here for follow up for inflammatory arthritis and chronic urticaria on HCQ 400 mg daily. She had a significant flare up of symptoms around Thanksgiving that resolved after a couple weeks without specific intervention. She takes advil as needed when joint pain and swelling are worse and when really bad sometimes flexeril. She had another shorter episode of symptoms for about 4 days earlier this year. Most common joint swelling lately is at the 3rd knuckle of each hand, knee swelling has been less common. She did not have many episodes of hives outside of these flares. She is starting to have nasal congestion and hoarseness of voice she thinks is seasonal allergies returning.   Previous HPI 08/02/21 Holly Ortiz is a 56 y.o. female here for follow up for inflammatory arthritis and chronic urticarial rashes on hydroxychloroquine 400 mg PO daily. Due  to low back and knee pain that seemed noninflammatory also recommended trial of oral muscle relaxant as needed at night.  So far she feels a very good improvement in her overall symptoms.  She has not had any recurrence of skin rash since at least 2 months ago.  Joint pains are doing well.  She still has some of the fatigue and concentration difficulty though this is also partially better.   02/22/21 Holly Ortiz is a 56 y.o. female here for evaluation of positive ANA with chronic joint pains, fatigue, and intermittent hives for years. She describes symptoms starting since around 2007. She has had pain in joints and muscles at various areas to some extent most of the time. She also developed a problem with recurrent urticaria rashes at multiple areas, most often after increased stress or other provocative events such as infection and antibiotic use. Since that time she has had workup with allergy and immunology clinics and dermatology clinic with no specific cause able to be found. A more recent symptom is change in sensation in her legs and feet. She notices swelling and discomfort after prolonged standing or prolonged sitting. She had a few episodes of becoming dizzy and unstable after standing and a few falls.   Labs reviewed 01/2021 ANA 1:80 homogenous, speckled CBC wnl TSH wnl CK wnl CRP wnl   Review of Systems  Constitutional:  Positive for fatigue.  HENT:  Positive for mouth dryness. Negative for mouth sores.   Eyes:  Negative for dryness.  Respiratory:  Negative for shortness of breath.   Cardiovascular:  Negative for chest pain and palpitations.  Gastrointestinal:  Negative for blood in stool, constipation and diarrhea.  Endocrine: Positive for increased urination.  Genitourinary:  Positive for involuntary urination.  Musculoskeletal:  Positive for joint pain, joint pain, myalgias, morning stiffness, muscle tenderness and myalgias. Negative for gait problem, joint swelling and  muscle weakness.  Skin:  Negative for color change, rash, hair loss and sensitivity to sunlight.  Allergic/Immunologic: Negative for susceptible to infections.  Neurological:  Negative for dizziness and headaches.  Hematological:  Negative for swollen glands.  Psychiatric/Behavioral:  Negative for depressed mood and sleep disturbance. The patient is nervous/anxious.     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   Iron deficiency 07/07/2020   Left low back pain 01/14/2020   Anemia 12/02/2019   Lightheadedness 12/02/2019   Menorrhagia 08/07/2019   Abnormal uterine bleeding 08/07/2019   Anxiety and depression 07/03/2018   DKA (diabetic ketoacidoses) 03/28/2018   Tachycardia 03/28/2018   Urticaria 03/28/2018   Hyperosmolar hyperglycemia 01/04/2018   AKI (acute kidney injury) ([place]) 01/04/2018   Hyperglycemia 01/03/2018   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-I)    Asthma    Type 2 diabetes mellitus with complication, with long-term current use of insulin (HCC)    Elevated cholesterol    Essential hypertension     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-I)    Anxiety    on meds   Asthma    childhood   Cataract 2015   bilateral   Constipation    takes Miralax daily   Depression    on meds   Diabetes mellitus    type II-on meds   Hyperlipidemia    on meds   Hypertension    on meds   Lupus ([place]) 2022   dx 02/2021   Seasonal allergies    Urticaria, chronic    UTI (lower urinary tract infection)     Family History  Problem Relation Age of Onset   Colon polyps Mother 32   Colon cancer Mother 26   Hypertension Mother    Diabetes Father    Hypertension Father    COPD Father        Cause of death   Hypertension  Sister    Diabetes Sister    Cancer Sister        Brain glioblastoma   Diabetes Sister    Hypertension Brother    Hyperlipidemia Other    Heart disease Other    Esophageal cancer Neg Hx    Stomach cancer Neg Hx    Rectal cancer Neg Hx    Past Surgical History:  Procedure Laterality Date   ABDOMINAL HYSTERECTOMY  08/07/2019   BREAST ENHANCEMENT SURGERY  1993   breast lift/reduction   CATARACT EXTRACTION, BILATERAL Bilateral 2014   CATARACT EXTRACTION, BILATERAL Bilateral 04/2022   COLONOSCOPY  2014   in [place] Bilateral 08/07/2019   Procedure: LAPAROSCOPIC ASSISTED VAGINAL HYSTERECTOMY WITH SALPINGO OOPHORECTOMY, possible abdominal hysterectomy;  Surgeon: Everlene Farrier, MD;  Location: [facility];  Service: Gynecology;  Laterality: Bilateral;  possible abdominal hysterectomy pt is diabetic and hypertensive   SOFT TISSUE MASS EXCISION Right 2016   [place]   [place]   tummy tuck  1993   Social History   Social History Narrative   Not on file   Immunization History  Administered Date(s) Administered   DTaP 07/04/2009   Influenza,inj,Quad PF,6+ Mos 01/05/2018, 08/28/2018, 09/05/2019, 10/05/2020   PFIZER(Purple Top)SARS-COV-2 Vaccination 02/03/2020, 03/02/2020   Pneumococcal Polysaccharide-23 01/05/2018   Tdap 12/04/2012   Zoster Recombinat (Shingrix) 06/29/2021     Objective: Vital Signs: BP (!) 144/84 (BP Location: Right Arm, Patient Position: Sitting, Cuff Size: Normal)   Pulse (!) 106   Resp 17   Ht '5\' 3"'$  (1.6 m)   Wt 217 lb (98.4 kg)   LMP 05/13/2019 Comment: patient had not had her period in a year and it came back in May and June 2020  BMI 38.44 kg/m    Physical Exam Constitutional:      Appearance: She is obese.  Cardiovascular:     Rate and Rhythm: Regular rhythm. Tachycardia present.  Pulmonary:     Effort: Pulmonary effort is normal.     Breath sounds: Normal breath sounds.   Musculoskeletal:     Right lower leg: No edema.     Left lower leg: No edema.  Skin:    General: Skin is warm and dry.     Findings: No rash.     Comments: Multiple scratch marks on both legs  Neurological:     Mental Status: She is alert.  Psychiatric:        Mood and Affect: Mood normal.      Musculoskeletal Exam:  Shoulders full ROM no tenderness or swelling Elbows full ROM no tenderness or swelling Wrists full ROM no tenderness or swelling Fingers full ROM no tenderness or swelling Low back tenderness to pressure over midline and on left side, mild tenderness extending around to side but no LUQ tenderness Hip normal internal and external rotation without pain, no tenderness to lateral hip palpation Knees full ROM no tenderness or swelling Ankles full ROM no tenderness or swelling MTPs full ROM no tenderness or swelling  Investigation: No additional findings.  Imaging: No results found.  Recent Labs: Lab Results  Component Value Date   WBC 7.6 02/02/2022   HGB 12.7 02/02/2022   PLT 351 02/02/2022   NA 137 03/28/2022   NA 137 03/28/2022   K 3.4 (L) 03/28/2022   K 3.4 (L) 03/28/2022   CL 98 03/28/2022   CL 98 03/28/2022   CO2 31 03/28/2022   CO2 31 03/28/2022   GLUCOSE 267 (H) 03/28/2022   GLUCOSE 267 (H) 03/28/2022   BUN 13 03/28/2022   BUN 13 03/28/2022   CREATININE 0.53 03/28/2022   CREATININE 0.53 03/28/2022   BILITOT 0.4 03/28/2022   ALKPHOS 67 03/28/2022   AST 15 03/28/2022   ALT 25 03/28/2022   PROT 7.4 03/28/2022   ALBUMIN 4.2 03/28/2022   CALCIUM 9.6 03/28/2022   CALCIUM 9.6 03/28/2022   GFRAA >60 07/30/2019    Speciality Comments: No specialty comments available.  Procedures:  No procedures performed Allergies: Ozempic (0.25 or 0.5 mg-dose) [semaglutide(0.25 or 0.'5mg'$ -dos)], Actos [pioglitazone hydrochloride], Lisinopril, and Metformin and related   Assessment / Plan:     Visit Diagnoses: Undifferentiated connective tissue disease ([place])  - Plan: Sedimentation rate, Anti-DNA antibody, double-stranded  Symptoms appear pretty well controlled overall no active swelling or rashes no joint tenderness outside of  her back on exam today.  Rechecking sedimentation rate and double-stranded DNA antibody titer for disease activity monitoring.  Plan to continue hydroxychloroquine 400 mg daily.  High risk medication use - HCQ 400 mg daily - Plan: CBC with Differential/Platelet, BASIC METABOLIC PANEL WITH GFR  Checking CBC and basic metabolic panel for hydroxychloroquine toxicity monitoring.  We still do not have ophthalmology exam for retinal toxicity screening on file reviewed importance of this for her she does have scheduled upcoming ophthalmology follow-up sounds like in October.  Urticaria  Skin rashes currently well controlled without active complaints today.  Left flank pain - Plan: Urinalysis  Not sure if the flank pain is related to inflammation or other underlying cause.  Checking urinalysis to screen for bacteria or blood she does not have strong history suggestive for infection.  Cannot exclude some muscle spasm or irritation as alternative cause with suspect this if labs are normal.  Orders: Orders Placed This Encounter  Procedures   Sedimentation rate   Anti-DNA antibody, double-stranded   Urinalysis   CBC with Differential/Platelet   BASIC METABOLIC PANEL WITH GFR   No orders of the defined types were placed in this encounter.    Follow-Up Instructions: No follow-ups on file.   Collier Salina, MD  Note - This record has been created using [employer].  Chart creation errors have been sought, but may not always  have been located. Such creation errors do not reflect on  the standard of medical care.

## 2022-07-31 ENCOUNTER — Ambulatory Visit: Payer: 59 | Attending: Internal Medicine | Admitting: Internal Medicine

## 2022-07-31 ENCOUNTER — Encounter: Payer: Self-pay | Admitting: Internal Medicine

## 2022-07-31 VITALS — BP 144/84 | HR 106 | Resp 17 | Ht 63.0 in | Wt 217.0 lb

## 2022-07-31 DIAGNOSIS — M359 Systemic involvement of connective tissue, unspecified: Secondary | ICD-10-CM | POA: Diagnosis not present

## 2022-07-31 DIAGNOSIS — Z79899 Other long term (current) drug therapy: Secondary | ICD-10-CM | POA: Diagnosis not present

## 2022-07-31 DIAGNOSIS — R109 Unspecified abdominal pain: Secondary | ICD-10-CM | POA: Insufficient documentation

## 2022-07-31 DIAGNOSIS — L509 Urticaria, unspecified: Secondary | ICD-10-CM | POA: Diagnosis not present

## 2022-07-31 DIAGNOSIS — R350 Frequency of micturition: Secondary | ICD-10-CM

## 2022-08-01 LAB — URINALYSIS
Bilirubin Urine: NEGATIVE
Ketones, ur: NEGATIVE
Nitrite: NEGATIVE
Specific Gravity, Urine: 1.031 (ref 1.001–1.035)
pH: 6.5 (ref 5.0–8.0)

## 2022-08-01 LAB — CBC WITH DIFFERENTIAL/PLATELET
Absolute Monocytes: 610 cells/uL (ref 200–950)
Basophils Absolute: 36 cells/uL (ref 0–200)
Basophils Relative: 0.4 %
Eosinophils Absolute: 118 cells/uL (ref 15–500)
Eosinophils Relative: 1.3 %
HCT: 43.4 % (ref 35.0–45.0)
Hemoglobin: 13.7 g/dL (ref 11.7–15.5)
Lymphs Abs: 1674 cells/uL (ref 850–3900)
MCH: 26 pg — ABNORMAL LOW (ref 27.0–33.0)
MCHC: 31.6 g/dL — ABNORMAL LOW (ref 32.0–36.0)
MCV: 82.5 fL (ref 80.0–100.0)
MPV: 9.1 fL (ref 7.5–12.5)
Monocytes Relative: 6.7 %
Neutro Abs: 6661 cells/uL (ref 1500–7800)
Neutrophils Relative %: 73.2 %
Platelets: 320 10*3/uL (ref 140–400)
RBC: 5.26 10*6/uL — ABNORMAL HIGH (ref 3.80–5.10)
RDW: 14.1 % (ref 11.0–15.0)
Total Lymphocyte: 18.4 %
WBC: 9.1 10*3/uL (ref 3.8–10.8)

## 2022-08-01 LAB — SEDIMENTATION RATE: Sed Rate: 43 mm/h — ABNORMAL HIGH (ref 0–30)

## 2022-08-01 LAB — BASIC METABOLIC PANEL WITH GFR
BUN: 14 mg/dL (ref 7–25)
CO2: 28 mmol/L (ref 20–32)
Calcium: 10.2 mg/dL (ref 8.6–10.4)
Chloride: 99 mmol/L (ref 98–110)
Creat: 0.55 mg/dL (ref 0.50–1.03)
Glucose, Bld: 246 mg/dL — ABNORMAL HIGH (ref 65–99)
Potassium: 4.5 mmol/L (ref 3.5–5.3)
Sodium: 139 mmol/L (ref 135–146)
eGFR: 108 mL/min/{1.73_m2} (ref >=60)

## 2022-08-01 LAB — ANTI-DNA ANTIBODY, DOUBLE-STRANDED: ds DNA Ab: 31 IU/mL — ABNORMAL HIGH

## 2022-08-02 ENCOUNTER — Telehealth: Payer: Self-pay | Admitting: Internal Medicine

## 2022-08-02 DIAGNOSIS — R350 Frequency of micturition: Secondary | ICD-10-CM | POA: Insufficient documentation

## 2022-08-02 MED ORDER — CEFACLOR 250 MG PO CAPS: 250.0000 mg | ORAL_CAPSULE | Freq: Three times a day (TID) | ORAL | 0 refills | Status: AC

## 2022-08-02 NOTE — Addendum Note (Signed)
Addended by: Collier Salina on: 08/02/2022 01:58 PM   Modules accepted: Orders

## 2022-08-02 NOTE — Telephone Encounter (Signed)
Patient called the office stating she had labs on Monday and is having pain around her kidneys. Patient requests a call back with the results.

## 2022-08-02 NOTE — Telephone Encounter (Signed)
I spoke with Holly Ortiz her lab results show normal double-stranded DNA but sedimentation rate is markedly increased.  Urinalysis shows some red and white blood cells negative for nitrates.  There is also a high amount of glucose in the urine consistent with high blood glucose on metabolic panel.  Overall does look suspicious for urinary tract infection I am recommending she comes back to the clinic for a lab visit with urine sample tomorrow morning.  We will go ahead and send an antibiotic prescription to [facility] recommending third-generation cephalosporin due to history of lupus also concerned about possible upper urinary tract involvement.

## 2022-08-03 ENCOUNTER — Other Ambulatory Visit: Payer: Self-pay | Admitting: *Deleted

## 2022-08-03 DIAGNOSIS — R109 Unspecified abdominal pain: Secondary | ICD-10-CM

## 2022-08-03 DIAGNOSIS — R350 Frequency of micturition: Secondary | ICD-10-CM

## 2022-08-06 LAB — URINE CULTURE

## 2022-08-10 ENCOUNTER — Telehealth: Payer: Self-pay

## 2022-08-10 NOTE — Telephone Encounter (Signed)
Left message for patient to call us back regarding E coli in labs.

## 2022-08-10 NOTE — Telephone Encounter (Signed)
Urine culture results do show E. Coli which is a common bacteria cause of UTI. It appears to be susceptible to the prescribed medication Have her symptoms improved with the prescribed antibiotics?

## 2022-08-10 NOTE — Progress Notes (Signed)
Findings already addressed in an associated telephone encounter note.

## 2022-08-11 ENCOUNTER — Telehealth: Payer: Self-pay

## 2022-08-11 NOTE — Telephone Encounter (Signed)
Error

## 2022-08-11 NOTE — Telephone Encounter (Signed)
Advised patient that her urine culture results do show E. Coli which is a common bacteria cause of UTI. Advised that it appeared Holly Ortiz to be susceptible to the prescribed medication and wanted to know if her condition had changed. Asked patient if her symptoms improved with the prescribed antibiotics. Patient responded that her condition has improved with the antibiotics and would reach out to [place] if she had any concerns.

## 2022-09-15 ENCOUNTER — Other Ambulatory Visit (HOSPITAL_BASED_OUTPATIENT_CLINIC_OR_DEPARTMENT_OTHER): Payer: Self-pay | Admitting: Family Medicine

## 2022-09-15 DIAGNOSIS — Z1231 Encounter for screening mammogram for malignant neoplasm of breast: Secondary | ICD-10-CM

## 2022-09-17 ENCOUNTER — Other Ambulatory Visit: Payer: Self-pay | Admitting: Family Medicine

## 2022-09-17 DIAGNOSIS — L509 Urticaria, unspecified: Secondary | ICD-10-CM

## 2022-09-19 ENCOUNTER — Ambulatory Visit (HOSPITAL_BASED_OUTPATIENT_CLINIC_OR_DEPARTMENT_OTHER)
Admission: RE | Admit: 2022-09-19 | Discharge: 2022-09-19 | Disposition: A | Discharge status: Home / Self Care | Payer: 59 | Source: Ambulatory Visit | Attending: Family Medicine | Admitting: Family Medicine

## 2022-09-19 ENCOUNTER — Encounter (HOSPITAL_BASED_OUTPATIENT_CLINIC_OR_DEPARTMENT_OTHER): Payer: Self-pay

## 2022-09-19 DIAGNOSIS — Z1231 Encounter for screening mammogram for malignant neoplasm of breast: Secondary | ICD-10-CM | POA: Diagnosis present

## 2022-09-22 ENCOUNTER — Ambulatory Visit: Payer: 59 | Admitting: Family Medicine

## 2022-09-22 ENCOUNTER — Encounter: Payer: Self-pay | Admitting: Family Medicine

## 2022-09-22 VITALS — BP 126/67 | HR 101 | Temp 98.0°F | Ht 63.0 in | Wt 218.8 lb

## 2022-09-22 DIAGNOSIS — F419 Anxiety disorder, unspecified: Secondary | ICD-10-CM | POA: Diagnosis not present

## 2022-09-22 DIAGNOSIS — Z794 Long term (current) use of insulin: Secondary | ICD-10-CM | POA: Diagnosis not present

## 2022-09-22 DIAGNOSIS — I10 Essential (primary) hypertension: Secondary | ICD-10-CM | POA: Diagnosis not present

## 2022-09-22 DIAGNOSIS — R5381 Other malaise: Secondary | ICD-10-CM

## 2022-09-22 DIAGNOSIS — E118 Type 2 diabetes mellitus with unspecified complications: Secondary | ICD-10-CM

## 2022-09-22 DIAGNOSIS — R5383 Other fatigue: Secondary | ICD-10-CM | POA: Diagnosis not present

## 2022-09-22 DIAGNOSIS — E7800 Pure hypercholesterolemia, unspecified: Secondary | ICD-10-CM | POA: Diagnosis not present

## 2022-09-22 DIAGNOSIS — F32A Depression, unspecified: Secondary | ICD-10-CM

## 2022-09-22 LAB — COMPREHENSIVE METABOLIC PANEL
ALT: 27 U/L (ref 0–35)
AST: 20 U/L (ref 0–37)
Albumin: 4 g/dL (ref 3.5–5.2)
Alkaline Phosphatase: 68 U/L (ref 39–117)
BUN: 14 mg/dL (ref 6–23)
CO2: 29 mEq/L (ref 19–32)
Calcium: 9.6 mg/dL (ref 8.4–10.5)
Chloride: 101 mEq/L (ref 96–112)
Creatinine, Ser: 0.66 mg/dL (ref 0.40–1.20)
GFR: 98.56 mL/min (ref >60.00)
Glucose, Bld: 231 mg/dL — ABNORMAL HIGH (ref 70–99)
Potassium: 3.7 mEq/L (ref 3.5–5.1)
Sodium: 140 mEq/L (ref 135–145)
Total Bilirubin: 0.3 mg/dL (ref 0.2–1.2)
Total Protein: 7.3 g/dL (ref 6.0–8.3)

## 2022-09-22 LAB — CBC
HCT: 39.4 % (ref 36.0–46.0)
Hemoglobin: 12.5 g/dL (ref 12.0–15.0)
MCHC: 31.6 g/dL (ref 30.0–36.0)
MCV: 81.1 fl (ref 78.0–100.0)
Platelets: 294 10*3/uL (ref 150.0–400.0)
RBC: 4.85 Mil/uL (ref 3.87–5.11)
RDW: 15 % (ref 11.5–15.5)
WBC: 7.4 10*3/uL (ref 4.0–10.5)

## 2022-09-22 LAB — LDL CHOLESTEROL, DIRECT: Direct LDL: 89 mg/dL

## 2022-09-22 LAB — LIPID PANEL
Cholesterol: 150 mg/dL (ref 0–200)
HDL: 37 mg/dL — ABNORMAL LOW (ref >39.00)
NonHDL: 112.61
Total CHOL/HDL Ratio: 4
Triglycerides: 212 mg/dL — ABNORMAL HIGH (ref 0.0–149.0)
VLDL: 42.4 mg/dL — ABNORMAL HIGH (ref 0.0–40.0)

## 2022-09-22 LAB — POC COVID19 BINAXNOW: SARS Coronavirus 2 Ag: NEGATIVE

## 2022-09-22 NOTE — Progress Notes (Signed)
Established Patient Office Visit  Subjective   Patient ID: Holly Ortiz, female    DOB: 09/27/1966  Age: 56 y.o. MRN: [id number]  Chief Complaint  Patient presents with   Follow-up    6 month follow up, no concerns. Patient fasting.     HPI follow-up of hypertension, elevated cholesterol, type 2 diabetes uncontrolled, anxiety and depression.  Woke up with hives this morning.  This is an ongoing occurrence for her.  It is usually associated with viral illness.  [place].  She feels malaise but has no respiratory tract symptoms at this time.  She is treating it with Benadryl and calamine lotion.  Continues to see endocrinology for her type 2 diabetes that has been historically poorly controlled.  Tells me that her fasting sugars have been in the low 100s.  Do not see a recent office visit with Dr. Dwyane Dee.  She did have an eye check back in May and is status post excision of nuclear sclerosis.  Blood pressure is controlled well with amlodipine.  She has an allergy to lisinopril.  Continues to take atorvastatin 40 mg and fenofibrate 145 for elevated cholesterol and triglycerides.  Has not recently been exercising.    Review of Systems  Constitutional:  Positive for malaise/fatigue. Negative for chills and fever.  HENT: Negative.    Eyes:  Negative for blurred vision, discharge and redness.  Respiratory: Negative.    Cardiovascular: Negative.   Gastrointestinal:  Negative for abdominal pain.  Genitourinary: Negative.   Musculoskeletal: Negative.  Negative for myalgias.  Skin:  Positive for rash.  Neurological:  Negative for tingling, loss of consciousness and weakness.  Endo/Heme/Allergies:  Negative for polydipsia.      Objective:     BP 126/67 (BP Location: Right Arm, Patient Position: Sitting, Cuff Size: Large)   Pulse (!) 101   Temp 98 F (36.7 C) (Temporal)   Ht '5\' 3"'$  (1.6 m)   Wt 218 lb 12.8 oz (99.2 kg)    LMP 05/13/2019 Comment: patient had not had her period in a year and it came back in May and June 2020  SpO2 95%   BMI 38.76 kg/m  BP Readings from Last 3 Encounters:  09/22/22 126/67  07/31/22 (!) 144/84  03/23/22 124/66   Wt Readings from Last 3 Encounters:  09/22/22 218 lb 12.8 oz (99.2 kg)  07/31/22 217 lb (98.4 kg)  03/23/22 222 lb (100.7 kg)      Physical Exam Constitutional:      General: She is not in acute distress.    Appearance: Normal appearance. She is not ill-appearing, toxic-appearing or diaphoretic.  HENT:     Head: Normocephalic and atraumatic.     Right Ear: External ear normal.     Left Ear: External ear normal.     Mouth/Throat:     Mouth: Mucous membranes are moist.     Pharynx: Oropharynx is clear. No oropharyngeal exudate or posterior oropharyngeal erythema.  Eyes:     General: No scleral icterus.       Right eye: No discharge.        Left eye: No discharge.     Extraocular Movements: Extraocular movements intact.     Conjunctiva/sclera: Conjunctivae normal.     Pupils: Pupils are equal, round, and reactive to light.  Cardiovascular:     Rate and Rhythm: Normal rate and regular rhythm.  Pulmonary:     Effort:  Pulmonary effort is normal. No respiratory distress.     Breath sounds: Normal breath sounds.  Musculoskeletal:     Cervical back: No rigidity or tenderness.  Skin:    General: Skin is warm and dry.  Neurological:     Mental Status: She is alert and oriented to person, place, and time.  Psychiatric:        Mood and Affect: Mood normal.        Behavior: Behavior normal.      No results found for any visits on 09/22/22.    The 10-year ASCVD risk score (Arnett DK, et al., 2019) is: 5.2%    Assessment & Plan:   Problem List Items Addressed This Visit       Cardiovascular and Mediastinum   Essential hypertension - Primary   Relevant Orders   CBC   Comprehensive metabolic panel   Urinalysis, Routine w reflex microscopic    Microalbumin / creatinine urine ratio     Endocrine   Type 2 diabetes mellitus with complication, with long-term current use of insulin (HCC)   Relevant Orders   Comprehensive metabolic panel   Microalbumin / creatinine urine ratio   Ambulatory referral to Endocrinology     Other   Elevated cholesterol   Relevant Orders   Comprehensive metabolic panel   Lipid panel   Anxiety and depression   Other Visit Diagnoses     Malaise and fatigue       Relevant Orders   POC COVID-19       Return in about 6 months (around 03/24/2023), or if symptoms worsen or fail to improve.  Continue amlodipine 10 mg for hypertension.  May consider an ARB but patient is taking an SGLT2 inhibitor.  Continue atorvastatin with fenofibrate for dyslipidemia.  Have asked for referral back to Dr. Dwyane Dee.  Have not seen the visit in the recent past.  Continue Lexapro.  Encouraged her to exercise by walking.  Libby Maw, MD

## 2022-09-27 ENCOUNTER — Other Ambulatory Visit: Payer: Self-pay | Admitting: Family Medicine

## 2022-09-27 ENCOUNTER — Other Ambulatory Visit: Payer: Self-pay | Admitting: Endocrinology

## 2022-09-27 DIAGNOSIS — I10 Essential (primary) hypertension: Secondary | ICD-10-CM

## 2022-10-01 ENCOUNTER — Other Ambulatory Visit: Payer: Self-pay | Admitting: Endocrinology

## 2022-10-01 DIAGNOSIS — E1165 Type 2 diabetes mellitus with hyperglycemia: Secondary | ICD-10-CM

## 2022-10-02 ENCOUNTER — Other Ambulatory Visit (INDEPENDENT_AMBULATORY_CARE_PROVIDER_SITE_OTHER): Payer: 59

## 2022-10-02 DIAGNOSIS — E1165 Type 2 diabetes mellitus with hyperglycemia: Secondary | ICD-10-CM

## 2022-10-02 DIAGNOSIS — Z794 Long term (current) use of insulin: Secondary | ICD-10-CM

## 2022-10-02 LAB — MICROALBUMIN / CREATININE URINE RATIO
Creatinine,U: 24.7 mg/dL
Microalb Creat Ratio: 3.8 mg/g (ref 0.0–30.0)
Microalb, Ur: 0.9 mg/dL (ref 0.0–1.9)

## 2022-10-02 LAB — HEMOGLOBIN A1C: Hgb A1c MFr Bld: 11.8 % — ABNORMAL HIGH (ref 4.6–6.5)

## 2022-10-02 LAB — GLUCOSE, RANDOM: Glucose, Bld: 296 mg/dL — ABNORMAL HIGH (ref 70–99)

## 2022-10-05 ENCOUNTER — Ambulatory Visit: Payer: 59 | Admitting: Endocrinology

## 2022-10-05 NOTE — Progress Notes (Incomplete)
Patient ID: Holly Ortiz, female   DOB: [birth year], 56 y.o.   MRN: [id number]          Reason for Appointment: Follow-up for Type 2 Diabetes   History of Present Illness:          Date of diagnosis of type 2 diabetes mellitus:   At age 89      Background history:  Her diabetes was diagnosed incidentally when she was having unrelated problems She took various oral agents including metformin for several years About 5 years ago she was started on insulin and she apparently was taking only one kind of insulin possibly a premixed insulin twice a day She said that her blood sugars have been usually poorly controlled with A1c usually 8-9% She was also taking metformin, Amaryl and Januvia prior to moving here from [place]  Recent history:    INSULIN regimen is: Humulin U-500, 40-50-40/55-50 units 4 times a day  Non-insulin hypoglycemic drugs the patient is taking are: Farxiga 10 mg daily   Current management, blood sugar patterns and problems identified:  Her A1c is only slightly improved at 10.4 compared to 11.2  Fructosamine is 307, last  She did not bring her monitor for review again  She was told to take only availability for brand-name meter but she did not do so, she is not able to afford the co-pay for a CGM Freestyle libre  Although she thinks her blood sugars are high even fasting her lab fasting glucose was only 112 However she thinks her highest blood sugars are before dinnertime, not clear if she is having high readings at lunch also  She is taking somewhat arbitrary doses of insulin and has not adjusted them based on her discussion last visit.   Also she is adjusting her bedtime dose based on the blood sugar at that time rather than fasting reading  Appears that her blood sugars are mostly around 200 except sometimes lower fasting or before dinner She is trying to walk her dogs and since her joint pains are better she is a little more mobile Not on any steroids She  does not want to see the dietitian probably because of cost    Dinner at 6 pm          Side effects from medications have been: Vomiting from Ozempic, hives from [place]; abdominal swelling/weight gain with Lantus and Basaglar   Glucose monitoring:  done 1-2 times a day         Glucometer: [employer] brand       Blood Glucose readings from her recall   PRE-MEAL Fasting Lunch Dinner Bedtime Overall  Glucose range: 160-268 ? 200+ 200   Mean/median:        Prior  PRE-MEAL Fasting Lunch Dinner Bedtime Overall  Glucose range:  94-203   93-200  160-224   Mean/median:          Dietician visit, most recent: A few years ago in [place]  Weight history:  Wt Readings from Last 3 Encounters:  09/22/22 218 lb 12.8 oz (99.2 kg)  07/31/22 217 lb (98.4 kg)  03/23/22 222 lb (100.7 kg)    Glycemic control:   Lab Results  Component Value Date   HGBA1C 11.8 (H) 10/02/2022   HGBA1C 11.5 (H) 03/28/2022   HGBA1C 10.4 (H) 07/11/2021   Lab Results  Component Value Date   MICROALBUR 0.9 10/02/2022   LDLCALC 85 07/11/2021   CREATININE 0.66 09/22/2022   Lab Results  Component  Value Date   MICRALBCREAT 3.8 10/02/2022    Lab Results  Component Value Date   FRUCTOSAMINE 307 (H) 04/14/2021   FRUCTOSAMINE 314 (H) 09/30/2020   FRUCTOSAMINE 408 (H) 04/09/2020    Lab on 10/02/2022  Component Date Value Ref Range Status  . Microalb, Ur 10/02/2022 0.9  0.0 - 1.9 mg/dL Final  . Creatinine,U 10/02/2022 24.7  mg/dL Final  . Microalb Creat Ratio 10/02/2022 3.8  0.0 - 30.0 mg/g Final  . Glucose, Bld 10/02/2022 296 (H)  70 - 99 mg/dL Final  . Hgb A1c MFr Bld 10/02/2022 11.8 (H)  4.6 - 6.5 % Final   Glycemic Control Guidelines for People with Diabetes:Non Diabetic:  <6%Goal of Therapy: <7%Additional Action Suggested:  >8%     Allergies as of 10/05/2022       Reactions   Ozempic (0.25 Or 0.5 Mg-dose) [semaglutide(0.25 Or 0.'5mg'$ -dos)] Nausea And Vomiting   Actos [pioglitazone  Hydrochloride] Other (See Comments)   Wt gain   Lisinopril    Angioedema, including likely GI involvement   Metformin And Related Diarrhea, Nausea And Vomiting        Medication List        Accurate as of October 05, 2022  1:32 PM. If you have any questions, ask your nurse or doctor.          acetaminophen 500 MG tablet Commonly known as: TYLENOL Take 2 tablets (1,000 mg total) by mouth every 6 (six) hours as needed.   amLODipine 10 MG tablet Commonly known as: NORVASC TAKE 1 TABLET BY MOUTH EVERY DAY   atorvastatin 40 MG tablet Commonly known as: LIPITOR Take 1 tablet (40 mg total) by mouth daily.   BD Pen Needle Nano 2nd Gen 32G X 4 MM Misc Generic drug: Insulin Pen Needle Use four times daily to inject insulin.   cyanocobalamin 1000 MCG tablet Commonly known as: VITAMIN B12 Take 1,000 mcg by mouth daily.   cyclobenzaprine 5 MG tablet Commonly known as: FLEXERIL TAKE 1 TABLET BY MOUTH THREE TIMES A DAY AS NEEDED FOR MUSCLE SPASMS   escitalopram 20 MG tablet Commonly known as: LEXAPRO Take 1 tablet (20 mg total) by mouth daily.   Farxiga 10 MG Tabs tablet Generic drug: dapagliflozin propanediol TAKE 1 TABLET BY MOUTH EVERY DAY   fenofibrate 145 MG tablet Commonly known as: Tricor Take 1 tablet (145 mg total) by mouth daily.   HumuLIN R U-500 KwikPen 500 UNIT/ML KwikPen Generic drug: insulin regular human CONCENTRATED INJECT 45 UNITS UNDER THE SKIN BEFORE LUNCH, 20-25 UNITS AT SUPPER AND 30 UNITS AT BEDTIME   hydroxychloroquine 200 MG tablet Commonly known as: PLAQUENIL TAKE 2 TABLETS BY MOUTH EVERY DAY   melatonin 5 MG Tabs Take 5 mg by mouth at bedtime as needed.   montelukast 10 MG tablet Commonly known as: SINGULAIR TAKE 1 TABLET BY MOUTH EVERYDAY AT BEDTIME   polyethylene glycol 17 g packet Commonly known as: MIRALAX / GLYCOLAX Take 17 g by mouth daily.   triamterene-hydrochlorothiazide 37.5-25 MG tablet Commonly known as:  MAXZIDE-25 TAKE 1 TABLET BY MOUTH EVERY DAY   Vitamin D 50 MCG (2000 UT) Caps Take 2,000 Units by mouth daily.   zinc gluconate 50 MG tablet Take 50 mg by mouth daily.        Allergies:  Allergies  Allergen Reactions  . Ozempic (0.25 Or 0.5 Mg-Dose) [Semaglutide(0.25 Or 0.'5mg'$ -Dos)] Nausea And Vomiting  . Actos [Pioglitazone Hydrochloride] Other (See Comments)    Wt gain  .  Lisinopril     Angioedema, including likely GI involvement  . Metformin And Related Diarrhea and Nausea And Vomiting    Past Medical History:  Diagnosis Date  . 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-I)   . Anxiety    on meds  . Asthma    childhood  . Cataract 2015   bilateral  . Constipation    takes Miralax daily  . Depression    on meds  . Diabetes mellitus    type II-on meds  . Hyperlipidemia    on meds  . Hypertension    on meds  . Lupus ([place]) 2022   dx 02/2021  . Seasonal allergies   . Urticaria, chronic   . UTI (lower urinary tract infection)     Past Surgical History:  Procedure Laterality Date  . ABDOMINAL HYSTERECTOMY  08/07/2019  . BREAST ENHANCEMENT SURGERY  1993   breast lift/reduction  . CATARACT EXTRACTION, BILATERAL Bilateral 2014  . CATARACT EXTRACTION, BILATERAL Bilateral 04/2022  . COLONOSCOPY  2014   in [place]  . LAPAROSCOPIC VAGINAL HYSTERECTOMY WITH SALPINGO OOPHORECTOMY Bilateral 08/07/2019   Procedure: LAPAROSCOPIC ASSISTED VAGINAL HYSTERECTOMY WITH SALPINGO OOPHORECTOMY, possible abdominal hysterectomy;  Surgeon: Everlene Farrier, MD;  Location: [facility];  Service: Gynecology;  Laterality: Bilateral;  possible abdominal hysterectomy pt is diabetic and hypertensive  . SOFT TISSUE MASS EXCISION Right 2016   [place] area  . TUBAL LIGATION  1995  . tummy tuck  1993    Family History  Problem Relation Age of Onset  . Colon polyps Mother 46  . Colon cancer Mother 46  . Hypertension Mother   . Diabetes Father   . Hypertension Father   . COPD  Father        Cause of death  . Hypertension Sister   . Diabetes Sister   . Cancer Sister        Brain glioblastoma  . Diabetes Sister   . Hypertension Brother   . Hyperlipidemia Other   . Heart disease Other   . Esophageal cancer Neg Hx   . Stomach cancer Neg Hx   . Rectal cancer Neg Hx     Social History:  reports that she has never smoked. She has never been exposed to tobacco smoke. She has never used smokeless tobacco. She reports that she does not drink alcohol and does not use drugs.   Review of Systems   Lipid history: On Lipitor 40 mg from her PCP   Fenofibrate was added because of persistently high triglycerides and these are improving    Lab Results  Component Value Date   CHOL 150 09/22/2022   CHOL 149 03/28/2022   CHOL 154 07/11/2021   Lab Results  Component Value Date   HDL 37.00 (L) 09/22/2022   HDL 37.50 (L) 03/28/2022   HDL 40 07/11/2021   Lab Results  Component Value Date   LDLCALC 85 07/11/2021   LDLCALC 76 04/14/2021   LDLCALC 71 07/21/2019   Lab Results  Component Value Date   TRIG 212.0 (H) 09/22/2022   TRIG 239.0 (H) 03/28/2022   TRIG 171 (H) 07/11/2021   Lab Results  Component Value Date   CHOLHDL 4 09/22/2022   CHOLHDL 4 03/28/2022   CHOLHDL 3.9 07/11/2021   Lab Results  Component Value Date   LDLDIRECT 89.0 09/22/2022   LDLDIRECT 88.0 03/28/2022   LDLDIRECT 68.0 02/03/2021            Hypertension: Has been present since  age 63, on amlodipine triamterene HCTZ 25 prescribed by her PCP  May have had angioedema with lisinopril Apparently had taken lisinopril previously without difficulty She thinks her blood pressure is high because of stress at work  BP Readings from Last 3 Encounters:  09/22/22 126/67  07/31/22 (!) 144/84  03/23/22 124/66     Most recent foot exam: 9/19  Currently known complications of diabetes: None  LABS:  Lab on 10/02/2022  Component Date Value Ref Range Status  . Microalb, Ur 10/02/2022  0.9  0.0 - 1.9 mg/dL Final  . Creatinine,U 10/02/2022 24.7  mg/dL Final  . Microalb Creat Ratio 10/02/2022 3.8  0.0 - 30.0 mg/g Final  . Glucose, Bld 10/02/2022 296 (H)  70 - 99 mg/dL Final  . Hgb A1c MFr Bld 10/02/2022 11.8 (H)  4.6 - 6.5 % Final   Glycemic Control Guidelines for People with Diabetes:Non Diabetic:  <6%Goal of Therapy: <7%Additional Action Suggested:  >8%     Physical Examination:  LMP 05/13/2019 Comment: patient had not had her period in a year and it came back in May and June 2020      ASSESSMENT:  Diabetes type 2, with history of persistent hyperglycemia, insulin-dependent  See history of present illness for detailed discussion of current diabetes management, blood sugar patterns and problems identified  A1c is 10.4 and not much better  Currently on over 200 units of U-500 insulin along with [place]  She is insulin resistant  However appears to have some fluctuation in her blood sugars and even with recent better activity level and some weight loss she still has mostly high readings She did not bring her blood sugar meter and because of using the [employer] brand cannot download her meter to analyze patterns She also has been reluctant to see the dietitian  Discussed how to adjust her doses including mealtime and bedtime doses  Hypertension: Blood pressure appears difficult to control  Not clear if her blood pressure is high today from a stressful day at work Not checking regularly at home Does not have follow-up with PCP until November  LIPIDS: Labs showed improved triglycerides with fenofibrate and LDL is still below 100    PLAN:   Check to see if brand-name blood sugar monitoring such as Accu-Chek is covered by her insurance  Discussed adjusting her U-500 based on what she is planning to eat and the bedtime dose based on the fasting blood sugar patterns She will stay on Farxiga  To bring her monitor for review on the next visit and also check at least  some readings at lunchtime on weekends  Insulin doses will be 50 before breakfast, 60 before lunch, 45-55 before dinner and 55 at bedtime She needs to try and inject 30-minute before eating  Check blood pressure regularly at home and discuss with PCP if still consistently high, may benefit from losartan or other ARB  Needs to follow-up for eye exam   There are no Patient Instructions on file for this visit.    Elayne Snare 10/05/2022, 1:32 PM   Note: This office note was prepared with Dragon voice recognition system technology. Any transcriptional errors that result from this process are unintentional.

## 2023-01-01 ENCOUNTER — Ambulatory Visit: Payer: 59 | Admitting: Internal Medicine

## 2023-01-24 NOTE — Progress Notes (Unsigned)
Office Visit Note  Patient: Holly Ortiz             Date of Birth: [birth year]           MRN: [id number]             PCP: Libby Maw, MD Referring: Libby Maw,* Visit Date: 01/25/2023   Subjective:  No chief complaint on file.   History of Present Illness: Holly Ortiz is a 57 y.o. female here for follow up ***   Previous HPI 07/31/22 Holly Ortiz is a 57 y.o. female here for follow up for UCTD with symptoms of inflammatory arthritis and chronic urticaria on HCQ 400 mg daily. She has generally been doing well without severe flare ups. She has followed up with her ophthalmologist but not remembered to specifically address her HCQ screening exam. Since last week she is having new left flank pain radiating around the side somewhat along bottom of ribs. She does not recall any preceding illness or injury. She does notice increased urinary frequency during this time. No dysuria or gross hematuria. She has a history of a kidney stone once about 30 years ago during pregnancy and last known UTI about 10 years ago. She has a new dog and has a lot of new scratches from this.   Previous HPI 01/31/2022 Holly Ortiz is a 57 y.o. female here for follow up for inflammatory arthritis and chronic urticaria on HCQ 400 mg daily. She had a significant flare up of symptoms around Thanksgiving that resolved after a couple weeks without specific intervention. She takes advil as needed when joint pain and swelling are worse and when really bad sometimes flexeril. She had another shorter episode of symptoms for about 4 days earlier this year. Most common joint swelling lately is at the 3rd knuckle of each hand, knee swelling has been less common. She did not have many episodes of hives outside of these flares. She is starting to have nasal congestion and hoarseness of voice she thinks is seasonal allergies returning.   Previous HPI 08/02/21 Holly Ortiz is a 57 y.o. female  here for follow up for inflammatory arthritis and chronic urticarial rashes on hydroxychloroquine 400 mg PO daily. Due to low back and knee pain that seemed noninflammatory also recommended trial of oral muscle relaxant as needed at night.  So far she feels a very good improvement in her overall symptoms.  She has not had any recurrence of skin rash since at least 2 months ago.  Joint pains are doing well.  She still has some of the fatigue and concentration difficulty though this is also partially better.   02/22/21 Holly Ortiz is a 57 y.o. female here for evaluation of positive ANA with chronic joint pains, fatigue, and intermittent hives for years. She describes symptoms starting since around 2007. She has had pain in joints and muscles at various areas to some extent most of the time. She also developed a problem with recurrent urticaria rashes at multiple areas, most often after increased stress or other provocative events such as infection and antibiotic use. Since that time she has had workup with allergy and immunology clinics and dermatology clinic with no specific cause able to be found. A more recent symptom is change in sensation in her legs and feet. She notices swelling and discomfort after prolonged standing or prolonged sitting. She had a few episodes of becoming dizzy and unstable after standing and a few falls.   Labs reviewed 01/2021 ANA  1:80 homogenous, speckled CBC wnl TSH wnl CK wnl CRP wnl   No Rheumatology ROS completed.   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   Iron deficiency 07/07/2020   Left low back pain 01/14/2020   Anemia 12/02/2019   Lightheadedness 12/02/2019   Menorrhagia 08/07/2019   Abnormal uterine bleeding 08/07/2019   Anxiety and depression  07/03/2018   DKA (diabetic ketoacidoses) 03/28/2018   Tachycardia 03/28/2018   Urticaria 03/28/2018   Hyperosmolar hyperglycemia 01/04/2018   AKI (acute kidney injury) ([facility]) 01/04/2018   Hyperglycemia 01/03/2018   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-I)    Asthma    Type 2 diabetes mellitus with complication, with long-term current use of insulin (HCC)    Elevated cholesterol    Essential hypertension     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-I)    Anxiety    on meds   Asthma    childhood   Cataract 2015   bilateral   Constipation    takes Miralax daily   Depression    on meds   Diabetes mellitus    type II-on meds   Hyperlipidemia    on meds   Hypertension    on meds   Lupus ([place]) 2022   dx 02/2021   Seasonal allergies    Urticaria, chronic    UTI (lower urinary tract infection)     Family History  Problem Relation Age of Onset   Colon polyps Mother 34   Colon cancer Mother 24   Hypertension Mother    Diabetes Father    Hypertension Father    COPD Father        Cause of death   Hypertension Sister    Diabetes Sister    Cancer Sister        Brain glioblastoma   Diabetes Sister    Hypertension Brother    Hyperlipidemia Other    Heart disease Other    Esophageal cancer Neg Hx    Stomach cancer Neg Hx    Rectal cancer Neg Hx    Past Surgical History:  Procedure Laterality Date   ABDOMINAL HYSTERECTOMY  08/07/2019   BREAST ENHANCEMENT SURGERY  1993   breast lift/reduction   CATARACT EXTRACTION, BILATERAL Bilateral 2014   CATARACT EXTRACTION, BILATERAL Bilateral 04/2022   COLONOSCOPY  2014   in [place] Bilateral 08/07/2019   Procedure: LAPAROSCOPIC ASSISTED VAGINAL HYSTERECTOMY WITH SALPINGO OOPHORECTOMY, possible abdominal hysterectomy;  Surgeon: Everlene Farrier, MD;  Location: [facility];  Service: Gynecology;   Laterality: Bilateral;  possible abdominal hysterectomy pt is diabetic and hypertensive   SOFT TISSUE MASS EXCISION Right 2016   [place] area   Micro   tummy tuck  1993   Social History   Social History Narrative   Not on file   Immunization History  Administered Date(s) Administered   DTaP 07/04/2009   Influenza,inj,Quad PF,6+ Mos 01/05/2018, 08/28/2018, 09/05/2019, 10/05/2020   PFIZER(Purple Top)SARS-COV-2 Vaccination 02/03/2020, 03/02/2020   Pneumococcal Polysaccharide-23 01/05/2018   Tdap 12/04/2012   Zoster Recombinat (Shingrix) 06/29/2021     Objective: Vital Signs: LMP 05/13/2019 Comment: patient had not had her period in a year and it came back  in May and June 2020   Physical Exam   Musculoskeletal Exam: ***  CDAI Exam: CDAI Score: -- Patient Global: --; Provider Global: -- Swollen: --; Tender: -- Joint Exam 01/25/2023   No joint exam has been documented for this visit   There is currently no information documented on the homunculus. Go to the Rheumatology activity and complete the homunculus joint exam.  Investigation: No additional findings.  Imaging: No results found.  Recent Labs: Lab Results  Component Value Date   WBC 7.4 09/22/2022   HGB 12.5 09/22/2022   PLT 294.0 09/22/2022   NA 140 09/22/2022   K 3.7 09/22/2022   CL 101 09/22/2022   CO2 29 09/22/2022   GLUCOSE 296 (H) 10/02/2022   BUN 14 09/22/2022   CREATININE 0.66 09/22/2022   BILITOT 0.3 09/22/2022   ALKPHOS 68 09/22/2022   AST 20 09/22/2022   ALT 27 09/22/2022   PROT 7.3 09/22/2022   ALBUMIN 4.0 09/22/2022   CALCIUM 9.6 09/22/2022   GFRAA >60 07/30/2019    Speciality Comments: PLQ Eye Exam: 10/12/2022 WNL @ [facility]  Procedures:  No procedures performed Allergies: Ozempic (0.25 or 0.5 mg-dose) [semaglutide(0.25 or 0.57m-dos)], Actos [pioglitazone hydrochloride], Lisinopril, and Metformin and related   Assessment / Plan:     Visit  Diagnoses: No diagnosis found.  ***  Orders: No orders of the defined types were placed in this encounter.  No orders of the defined types were placed in this encounter.    Follow-Up Instructions: No follow-ups on file.   CCollier Salina MD  Note - This record has been created using [employer]  Chart creation errors have been sought, but may not always  have been located. Such creation errors do not reflect on  the standard of medical care.

## 2023-01-25 ENCOUNTER — Ambulatory Visit: Payer: 59 | Attending: Internal Medicine | Admitting: Internal Medicine

## 2023-01-25 ENCOUNTER — Encounter: Payer: Self-pay | Admitting: Internal Medicine

## 2023-01-25 VITALS — BP 135/83 | HR 97 | Resp 14 | Ht 63.0 in | Wt 203.0 lb

## 2023-01-25 DIAGNOSIS — R21 Rash and other nonspecific skin eruption: Secondary | ICD-10-CM

## 2023-01-25 DIAGNOSIS — R5383 Other fatigue: Secondary | ICD-10-CM

## 2023-01-25 DIAGNOSIS — M359 Systemic involvement of connective tissue, unspecified: Secondary | ICD-10-CM

## 2023-01-25 DIAGNOSIS — M2550 Pain in unspecified joint: Secondary | ICD-10-CM | POA: Diagnosis not present

## 2023-01-25 MED ORDER — HYDROXYCHLOROQUINE SULFATE 200 MG PO TABS: 400.0000 mg | ORAL_TABLET | Freq: Every day | ORAL | 1 refills | Status: DC

## 2023-01-25 MED ORDER — TRIAMCINOLONE ACETONIDE 0.5 % EX OINT: 1.0000 | TOPICAL_OINTMENT | Freq: Two times a day (BID) | CUTANEOUS | 0 refills | Status: DC | PRN

## 2023-01-25 NOTE — Patient Instructions (Signed)
I am prescribing a topical steroid you can apply on affected rash areas twice daily as needed do not wash off for 15 minutes. Do not use on face.  Your memory or concentration difficulty problems may be related to the increased stress and fatigue. I recommend checking out the [facility] patient-centered guide for fibromyalgia and chronic pain management: [email or web address]

## 2023-01-26 LAB — C3 AND C4
C3 Complement: 181 mg/dL (ref 83–193)
C4 Complement: 56 mg/dL (ref 15–57)

## 2023-01-26 LAB — SEDIMENTATION RATE: Sed Rate: 41 mm/h — ABNORMAL HIGH (ref 0–30)

## 2023-01-26 LAB — ANTI-DNA ANTIBODY, DOUBLE-STRANDED: ds DNA Ab: 23 IU/mL — ABNORMAL HIGH

## 2023-02-09 ENCOUNTER — Other Ambulatory Visit: Payer: Self-pay | Admitting: Family Medicine

## 2023-02-09 DIAGNOSIS — E7800 Pure hypercholesterolemia, unspecified: Secondary | ICD-10-CM

## 2023-03-05 ENCOUNTER — Ambulatory Visit: Payer: 59 | Admitting: Internal Medicine

## 2023-03-05 NOTE — Progress Notes (Deleted)
Office Visit Note  Patient: Holly Ortiz             Date of Birth: 04-21-1966           MRN: [id number]             PCP: Libby Maw, MD Referring: Libby Maw,* Visit Date: 03/05/2023   Subjective:  No chief complaint on file.   History of Present Illness: Holly Ortiz is a 57 y.o. female here for follow up ***   Previous HPI 01/25/23 Holly Ortiz is a 57 y.o. female here for follow up for UCTD with inflammatory arthritis and chronic urticaria on hydroxychloroquine 400 mg daily.  Past few months she reports worsening fatigue and concentration and short-term memory problems.  Also increased pain especially in the low back bilaterally.  Does not have pain or numbness radiation into the legs.  She is experienced some weakness and difficulty standing back up from the floor or crouched position.  Frequently wakes up with low back pain that improves after taking 2 regular Advil and moving around for a while.  Has not had severe outbreak of hives but keeps getting a small number of these around the neck and back of the scalp.  Follow-up with primary care office in December still had poorly controlled diabetes with hemoglobin A1c 11.8.  Skin rash behind the right knee is unchanged has a new erythematous rash in the right elbow. She has been under increased stress with work changes after her [place] branch angry and was close now working in [place] still getting used to the change in location and coworkers.   Previous HPI 07/31/22 Holly Ortiz is a 57 y.o. female here for follow up for UCTD with symptoms of inflammatory arthritis and chronic urticaria on HCQ 400 mg daily. She has generally been doing well without severe flare ups. She has followed up with her ophthalmologist but not remembered to specifically address her HCQ screening exam. Since last week she is having new left flank pain radiating around the side somewhat along bottom of ribs. She  does not recall any preceding illness or injury. She does notice increased urinary frequency during this time. No dysuria or gross hematuria. She has a history of a kidney stone once about 30 years ago during pregnancy and last known UTI about 10 years ago. She has a new dog and has a lot of new scratches from this.   Previous HPI 01/31/2022 Tremika Bielawski is a 57 y.o. female here for follow up for inflammatory arthritis and chronic urticaria on HCQ 400 mg daily. She had a significant flare up of symptoms around Thanksgiving that resolved after a couple weeks without specific intervention. She takes advil as needed when joint pain and swelling are worse and when really bad sometimes flexeril. She had another shorter episode of symptoms for about 4 days earlier this year. Most common joint swelling lately is at the 3rd knuckle of each hand, knee swelling has been less common. She did not have many episodes of hives outside of these flares. She is starting to have nasal congestion and hoarseness of voice she thinks is seasonal allergies returning.   Previous HPI 08/02/21 Holly Ortiz is a 57 y.o. female here for follow up for inflammatory arthritis and chronic urticarial rashes on hydroxychloroquine 400 mg PO daily. Due to low back and knee pain that seemed noninflammatory also recommended trial of oral muscle relaxant as needed at night.  So far she feels  a very good improvement in her overall symptoms.  She has not had any recurrence of skin rash since at least 2 months ago.  Joint pains are doing well.  She still has some of the fatigue and concentration difficulty though this is also partially better.   02/22/21 Holly Ortiz is a 57 y.o. female here for evaluation of positive ANA with chronic joint pains, fatigue, and intermittent hives for years. She describes symptoms starting since around 2007. She has had pain in joints and muscles at various areas to some extent most of the time. She also  developed a problem with recurrent urticaria rashes at multiple areas, most often after increased stress or other provocative events such as infection and antibiotic use. Since that time she has had workup with allergy and immunology clinics and dermatology clinic with no specific cause able to be found. A more recent symptom is change in sensation in her legs and feet. She notices swelling and discomfort after prolonged standing or prolonged sitting. She had a few episodes of becoming dizzy and unstable after standing and a few falls.   Labs reviewed 01/2021 ANA 1:80 homogenous, speckled CBC wnl TSH wnl CK wnl CRP wnl   No Rheumatology ROS completed.   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   Iron deficiency 07/07/2020   Left low back pain 01/14/2020   Anemia 12/02/2019   Lightheadedness 12/02/2019   Menorrhagia 08/07/2019   Abnormal uterine bleeding 08/07/2019   Anxiety and depression 07/03/2018   DKA (diabetic ketoacidoses) 03/28/2018   Tachycardia 03/28/2018   Urticaria 03/28/2018   Hyperosmolar hyperglycemia 01/04/2018   AKI (acute kidney injury) 01/04/2018   Hyperglycemia 01/03/2018   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-I)    Asthma    Type 2 diabetes mellitus with complication, with long-term current use of insulin    Elevated cholesterol    Essential hypertension     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-I)    Anxiety    on meds   Asthma    childhood   Cataract 2015   bilateral   Constipation    takes Miralax daily   Depression    on meds   Diabetes mellitus    type II-on meds    Hyperlipidemia    on meds   Hypertension    on meds   Lupus ([place]) 2022   dx 02/2021   Seasonal allergies    Urticaria, chronic    UTI (lower urinary tract infection)     Family History  Problem Relation Age of Onset   Colon polyps Mother 40   Colon cancer Mother 32   Hypertension Mother    Diabetes Father    Hypertension Father    COPD Father        Cause of death   Hypertension Sister    Diabetes Sister    Cancer Sister        Brain glioblastoma   Diabetes Sister    Hypertension Brother    Hyperlipidemia Other    Heart disease Other    Esophageal cancer Neg Hx    Stomach cancer Neg Hx  Rectal cancer Neg Hx    Past Surgical History:  Procedure Laterality Date   ABDOMINAL HYSTERECTOMY  08/07/2019   BREAST ENHANCEMENT SURGERY  1993   breast lift/reduction   CATARACT EXTRACTION, BILATERAL Bilateral 2014   CATARACT EXTRACTION, BILATERAL Bilateral 04/2022   COLONOSCOPY  2014   in [place] Bilateral 08/07/2019   Procedure: LAPAROSCOPIC ASSISTED VAGINAL HYSTERECTOMY WITH SALPINGO OOPHORECTOMY, possible abdominal hysterectomy;  Surgeon: Everlene Farrier, MD;  Location: [facility];  Service: Gynecology;  Laterality: Bilateral;  possible abdominal hysterectomy pt is diabetic and hypertensive   SOFT TISSUE MASS EXCISION Right 2016   [place] area   [place]   tummy tuck  1993   Social History   Social History Narrative   Not on file   Immunization History  Administered Date(s) Administered   DTaP 07/04/2009   Influenza,inj,Quad PF,6+ Mos 01/05/2018, 08/28/2018, 09/05/2019, 10/05/2020   PFIZER(Purple Top)SARS-COV-2 Vaccination 02/03/2020, 03/02/2020   Pneumococcal Polysaccharide-23 01/05/2018   Tdap 12/04/2012   Zoster Recombinat (Shingrix) 06/29/2021     Objective: Vital Signs: LMP 05/13/2019 Comment: patient had not had her period in a year and it came back in May and June 2020   Physical Exam    Musculoskeletal Exam: ***  CDAI Exam: CDAI Score: -- Patient Global: --; Provider Global: -- Swollen: --; Tender: -- Joint Exam 03/05/2023   No joint exam has been documented for this visit   There is currently no information documented on the homunculus. Go to the Rheumatology activity and complete the homunculus joint exam.  Investigation: No additional findings.  Imaging: No results found.  Recent Labs: Lab Results  Component Value Date   WBC 7.4 09/22/2022   HGB 12.5 09/22/2022   PLT 294.0 09/22/2022   NA 140 09/22/2022   K 3.7 09/22/2022   CL 101 09/22/2022   CO2 29 09/22/2022   GLUCOSE 296 (H) 10/02/2022   BUN 14 09/22/2022   CREATININE 0.66 09/22/2022   BILITOT 0.3 09/22/2022   ALKPHOS 68 09/22/2022   AST 20 09/22/2022   ALT 27 09/22/2022   PROT 7.3 09/22/2022   ALBUMIN 4.0 09/22/2022   CALCIUM 9.6 09/22/2022   GFRAA >60 07/30/2019    Speciality Comments: PLQ Eye Exam: 10/12/2022 WNL @ [facility]  Procedures:  No procedures performed Allergies: Ozempic (0.25 or 0.5 mg-dose) [semaglutide(0.25 or 0.5mg -dos)], Actos [pioglitazone hydrochloride], Lisinopril, and Metformin and related   Assessment / Plan:     Visit Diagnoses: No diagnosis found.  ***  Orders: No orders of the defined types were placed in this encounter.  No orders of the defined types were placed in this encounter.    Follow-Up Instructions: No follow-ups on file.   Collier Salina, MD  Note - This record has been created using [employer].  Chart creation errors have been sought, but may not always  have been located. Such creation errors do not reflect on  the standard of medical care.

## 2023-03-11 NOTE — Progress Notes (Signed)
Office Visit Note  Patient: Holly Ortiz             Date of Birth: 1966-07-20           MRN: [id number]             PCP: Mliss Sax, MD Referring: Mliss Sax,* Visit Date: 03/12/2023   Subjective:  Follow-up (Patient states she is having pain near her coccyx and legs. Patient states she has a lot of headaches. Patient states she has pain in her neck, shoulders, and arms. Patient states she is constantly tired. )   History of Present Illness: Holly Ortiz is a 57 y.o. female here for follow up for UCTD on hydroxychloroquine 400 mg daily symptoms currently in exacerbation for the past 3 weeks.  She is having increased pain in multiple areas neck shoulders arms as well as the low back and tailbone region.  She is feeling persistently fatigued every day also increased headaches.  She notices some more swelling in both hands then has been present for at least the past year.  She does describe very poor sleep during these few weeks and a very high level of work stress contributing.   Previous HPI 01/25/23 Holly Ortiz is a 57 y.o. female here for follow up for UCTD with inflammatory arthritis and chronic urticaria on hydroxychloroquine 400 mg daily.  Past few months she reports worsening fatigue and concentration and short-term memory problems.  Also increased pain especially in the low back bilaterally.  Does not have pain or numbness radiation into the legs.  She is experienced some weakness and difficulty standing back up from the floor or crouched position.  Frequently wakes up with low back pain that improves after taking 2 regular Advil and moving around for a while.  Has not had severe outbreak of hives but keeps getting a small number of these around the neck and back of the scalp.  Follow-up with primary care office in December still had poorly controlled diabetes with hemoglobin A1c 11.8.  Skin rash behind the right knee is unchanged has a new erythematous  rash in the right elbow. She has been under increased stress with work changes after her [employer] branch angry and was close now working in [place] still getting used to the change in location and coworkers.   Previous HPI 07/31/22 Holly Ortiz is a 57 y.o. female here for follow up for UCTD with symptoms of inflammatory arthritis and chronic urticaria on HCQ 400 mg daily. She has generally been doing well without severe flare ups. She has followed up with her ophthalmologist but not remembered to specifically address her HCQ screening exam. Since last week she is having new left flank pain radiating around the side somewhat along bottom of ribs. She does not recall any preceding illness or injury. She does notice increased urinary frequency during this time. No dysuria or gross hematuria. She has a history of a kidney stone once about 30 years ago during pregnancy and last known UTI about 10 years ago. She has a new dog and has a lot of new scratches from this.   Previous HPI 01/31/2022 Holly Ortiz is a 57 y.o. female here for follow up for inflammatory arthritis and chronic urticaria on HCQ 400 mg daily. She had a significant flare up of symptoms around Thanksgiving that resolved after a couple weeks without specific intervention. She takes advil as needed when joint pain and swelling are worse and when really bad  sometimes flexeril. She had another shorter episode of symptoms for about 4 days earlier this year. Most common joint swelling lately is at the 3rd knuckle of each hand, knee swelling has been less common. She did not have many episodes of hives outside of these flares. She is starting to have nasal congestion and hoarseness of voice she thinks is seasonal allergies returning.   Previous HPI 08/02/21 Holly Ortiz is a 57 y.o. female here for follow up for inflammatory arthritis and chronic urticarial rashes on hydroxychloroquine 400 mg PO daily. Due to low back and knee  pain that seemed noninflammatory also recommended trial of oral muscle relaxant as needed at night.  So far she feels a very good improvement in her overall symptoms.  She has not had any recurrence of skin rash since at least 2 months ago.  Joint pains are doing well.  She still has some of the fatigue and concentration difficulty though this is also partially better.   02/22/21 Holly Ortiz is a 57 y.o. female here for evaluation of positive ANA with chronic joint pains, fatigue, and intermittent hives for years. She describes symptoms starting since around 2007. She has had pain in joints and muscles at various areas to some extent most of the time. She also developed a problem with recurrent urticaria rashes at multiple areas, most often after increased stress or other provocative events such as infection and antibiotic use. Since that time she has had workup with allergy and immunology clinics and dermatology clinic with no specific cause able to be found. A more recent symptom is change in sensation in her legs and feet. She notices swelling and discomfort after prolonged standing or prolonged sitting. She had a few episodes of becoming dizzy and unstable after standing and a few falls.   Labs reviewed 01/2021 ANA 1:80 homogenous, speckled CBC wnl TSH wnl CK wnl CRP wnl   Review of Systems  Constitutional:  Positive for fatigue.  HENT:  Positive for mouth sores and mouth dryness.   Eyes:  Positive for dryness.  Respiratory:  Positive for shortness of breath.   Cardiovascular:  Positive for chest pain and palpitations.  Gastrointestinal:  Positive for constipation and diarrhea. Negative for blood in stool.  Endocrine: Positive for increased urination.  Genitourinary:  Positive for involuntary urination.  Musculoskeletal:  Positive for joint pain, gait problem, joint pain, joint swelling, myalgias, muscle weakness, morning stiffness, muscle tenderness and myalgias.  Skin:  Positive for  color change, rash and sensitivity to sunlight. Negative for hair loss.  Allergic/Immunologic: Negative for susceptible to infections.  Neurological:  Positive for dizziness and headaches.  Hematological:  Negative for swollen glands.  Psychiatric/Behavioral:  Positive for sleep disturbance. Negative for depressed mood. The patient is nervous/anxious.     PMFS History:  Patient Active Problem List   Diagnosis Date Noted   Other fatigue 01/25/2023   Rash and other nonspecific skin eruption 01/25/2023   Urinary frequency 08/02/2022   Left flank pain 07/31/2022   Pain in left knee 05/03/2021   Arthralgia 02/24/2021   Undifferentiated connective tissue disease (HCC) 02/22/2021   Iron deficiency 07/07/2020   Left low back pain 01/14/2020   Anemia 12/02/2019   Lightheadedness 12/02/2019   Menorrhagia 08/07/2019   Abnormal uterine bleeding 08/07/2019   Anxiety and depression 07/03/2018   DKA (diabetic ketoacidoses) 03/28/2018   Tachycardia 03/28/2018   Urticaria 03/28/2018   Hyperosmolar hyperglycemia 01/04/2018   AKI (acute kidney injury) (HCC) 01/04/2018   Hyperglycemia 01/03/2018  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-I)    Asthma    Type 2 diabetes mellitus with complication, with long-term current use of insulin (HCC)    Elevated cholesterol    Essential hypertension     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-I)    Anxiety    on meds   Asthma    childhood   Cataract 2015   bilateral   Constipation    takes Miralax daily   Depression    on meds   Diabetes mellitus    type II-on meds   Hyperlipidemia    on meds   Hypertension    on meds   Lupus ([facility]) 2022   dx 02/2021   Seasonal allergies    Urticaria, chronic    UTI (lower urinary tract infection)     Family History  Problem Relation Age of Onset   Colon polyps Mother 42   Colon cancer Mother 30   Hypertension Mother     Diabetes Father    Hypertension Father    COPD Father        Cause of death   Hypertension Sister    Diabetes Sister    Cancer Sister        Brain glioblastoma   Diabetes Sister    Hypertension Brother    Hyperlipidemia Other    Heart disease Other    Esophageal cancer Neg Hx    Stomach cancer Neg Hx    Rectal cancer Neg Hx    Past Surgical History:  Procedure Laterality Date   ABDOMINAL HYSTERECTOMY  08/07/2019   BREAST ENHANCEMENT SURGERY  1993   breast lift/reduction   CATARACT EXTRACTION, BILATERAL Bilateral 2014   CATARACT EXTRACTION, BILATERAL Bilateral 04/2022   COLONOSCOPY  2014   in [place]   LAPAROSCOPIC VAGINAL HYSTERECTOMY WITH SALPINGO OOPHORECTOMY Bilateral 08/07/2019   Procedure: LAPAROSCOPIC ASSISTED VAGINAL HYSTERECTOMY WITH SALPINGO OOPHORECTOMY, possible abdominal hysterectomy;  Surgeon: Harold Hedge, MD;  Location: [facility];  Service: Gynecology;  Laterality: Bilateral;  possible abdominal hysterectomy pt is diabetic and hypertensive   SOFT TISSUE MASS EXCISION Right 2016   [place] area   TUBAL LIGATION  1995   tummy tuck  1993   Social History   Social History Narrative   Not on file   Immunization History  Administered Date(s) Administered   DTaP 07/04/2009   Influenza,inj,Quad PF,6+ Mos 01/05/2018, 08/28/2018, 09/05/2019, 10/05/2020   PFIZER(Purple Top)SARS-COV-2 Vaccination 02/03/2020, 03/02/2020   Pneumococcal Polysaccharide-23 01/05/2018   Tdap 12/04/2012   Zoster Recombinat (Shingrix) 06/29/2021     Objective: Vital Signs: BP 138/78 (BP Location: Left Arm, Patient Position: Sitting, Cuff Size: Normal)   Pulse 96   Resp 16   Ht 5\' 3"  (1.6 m)   Wt 201 lb (91.2 kg)   LMP 05/13/2019 Comment: patient had not had her period in a year and it came back in May and June 2020  BMI 35.61 kg/m    Physical Exam Constitutional:      Appearance: She is obese.  Eyes:     Conjunctiva/sclera: Conjunctivae normal.  Cardiovascular:     Rate and  Rhythm: Normal rate and regular rhythm.  Pulmonary:     Effort: Pulmonary effort is normal.     Breath sounds: Normal breath sounds.  Lymphadenopathy:     Cervical: No cervical adenopathy.  Skin:    General: Skin is warm and dry.  Neurological:  Mental Status: She is alert.  Psychiatric:        Mood and Affect: Mood normal.      Musculoskeletal Exam:  Shoulders full ROM no tenderness or swelling Elbows full ROM no tenderness or swelling Wrists full ROM no tenderness or swelling Fingers full ROM some tenderness to pressure over MCP and PIP joints without palpable swelling Mild lateral hip tenderness Knees full ROM no tenderness or swelling  Investigation: No additional findings.  Imaging: No results found.  Recent Labs: Lab Results  Component Value Date   WBC 7.4 09/22/2022   HGB 12.5 09/22/2022   PLT 294.0 09/22/2022   NA 140 09/22/2022   K 3.7 09/22/2022   CL 101 09/22/2022   CO2 29 09/22/2022   GLUCOSE 296 (H) 10/02/2022   BUN 14 09/22/2022   CREATININE 0.66 09/22/2022   BILITOT 0.3 09/22/2022   ALKPHOS 68 09/22/2022   AST 20 09/22/2022   ALT 27 09/22/2022   PROT 7.3 09/22/2022   ALBUMIN 4.0 09/22/2022   CALCIUM 9.6 09/22/2022   GFRAA >60 07/30/2019    Speciality Comments: PLQ Eye Exam: 10/12/2022 WNL @ [facility]  Procedures:  No procedures performed Allergies: Ozempic (0.25 or 0.5 mg-dose) [semaglutide(0.25 or 0.5mg -dos)], Actos [pioglitazone hydrochloride], Lisinopril, and Metformin and related   Assessment / Plan:     Visit Diagnoses: Undifferentiated connective tissue disease (HCC) Urticaria  She is reporting increased amounts of peripheral swelling joints look okay on exam today.  Most recent labs in February did show a elevated sedimentation rate and double-stranded DNA titers.  Plan to continue on the hydroxychloroquine 400 mg daily.  Anxiety and depression Arthralgia, unspecified joint - Plan: DISCONTINUED: DULoxetine  (CYMBALTA) 30 MG capsule  Worsening of anxiety stress and insomnia issues probably all contributing to fatigue.  However also with worsening fatigue cognitive difficulty headaches and widespread body pains also suggestive for possible fibromyalgia syndrome exacerbation.  With not much peripheral synovitis on exam nonoperative treatment for this process for without DMARD adjustment.  Start Cymbalta 30 mg daily.  Orders: No orders of the defined types were placed in this encounter.  Meds ordered this encounter  Medications   DISCONTD: DULoxetine (CYMBALTA) 30 MG capsule    Sig: Take 1 capsule (30 mg total) by mouth daily.    Dispense:  30 capsule    Refill:  1     Follow-Up Instructions: Return in about 2 months (around 05/12/2023) for UCTD/?FMS HCQ/symbalta start f/u 2mos.   Fuller Plan, MD  Note - This record has been created using [employer].  Chart creation errors have been sought, but may not always  have been located. Such creation errors do not reflect on  the standard of medical care.

## 2023-03-12 ENCOUNTER — Encounter: Payer: Self-pay | Admitting: Internal Medicine

## 2023-03-12 ENCOUNTER — Ambulatory Visit: Payer: 59 | Attending: Internal Medicine | Admitting: Internal Medicine

## 2023-03-12 VITALS — BP 138/78 | HR 96 | Resp 16 | Ht 63.0 in | Wt 201.0 lb

## 2023-03-12 DIAGNOSIS — M359 Systemic involvement of connective tissue, unspecified: Secondary | ICD-10-CM | POA: Diagnosis not present

## 2023-03-12 DIAGNOSIS — F419 Anxiety disorder, unspecified: Secondary | ICD-10-CM | POA: Diagnosis not present

## 2023-03-12 DIAGNOSIS — M2550 Pain in unspecified joint: Secondary | ICD-10-CM | POA: Diagnosis not present

## 2023-03-12 DIAGNOSIS — F32A Depression, unspecified: Secondary | ICD-10-CM

## 2023-03-12 DIAGNOSIS — L509 Urticaria, unspecified: Secondary | ICD-10-CM

## 2023-03-12 MED ORDER — DULOXETINE HCL 30 MG PO CPEP: 30.0000 mg | ORAL_CAPSULE | Freq: Every day | ORAL | 1 refills | Status: DC

## 2023-03-18 ENCOUNTER — Other Ambulatory Visit: Payer: Self-pay | Admitting: Family Medicine

## 2023-03-18 DIAGNOSIS — I10 Essential (primary) hypertension: Secondary | ICD-10-CM

## 2023-03-18 DIAGNOSIS — L509 Urticaria, unspecified: Secondary | ICD-10-CM

## 2023-03-18 DIAGNOSIS — F419 Anxiety disorder, unspecified: Secondary | ICD-10-CM

## 2023-03-26 ENCOUNTER — Other Ambulatory Visit: Payer: Self-pay | Admitting: Family Medicine

## 2023-03-26 DIAGNOSIS — I10 Essential (primary) hypertension: Secondary | ICD-10-CM

## 2023-03-27 ENCOUNTER — Telehealth: Payer: Self-pay | Admitting: Family Medicine

## 2023-03-27 ENCOUNTER — Ambulatory Visit: Payer: 59 | Admitting: Family Medicine

## 2023-03-27 NOTE — Telephone Encounter (Signed)
4.23.24 no show letter sent 

## 2023-03-28 NOTE — Telephone Encounter (Signed)
No show history  10/27/2020, 05/23/2021, 10/04/2021, 03/27/2023  Generated fee & sent final warning letter via mail and mychart

## 2023-04-24 ENCOUNTER — Encounter: Payer: Self-pay | Admitting: Family Medicine

## 2023-04-24 ENCOUNTER — Ambulatory Visit (INDEPENDENT_AMBULATORY_CARE_PROVIDER_SITE_OTHER): Payer: 59 | Admitting: Family Medicine

## 2023-04-24 VITALS — BP 138/68 | HR 99 | Temp 97.8°F | Ht 63.0 in | Wt 200.8 lb

## 2023-04-24 DIAGNOSIS — E7800 Pure hypercholesterolemia, unspecified: Secondary | ICD-10-CM | POA: Diagnosis not present

## 2023-04-24 DIAGNOSIS — F419 Anxiety disorder, unspecified: Secondary | ICD-10-CM

## 2023-04-24 DIAGNOSIS — I10 Essential (primary) hypertension: Secondary | ICD-10-CM

## 2023-04-24 DIAGNOSIS — L509 Urticaria, unspecified: Secondary | ICD-10-CM | POA: Diagnosis not present

## 2023-04-24 DIAGNOSIS — F32A Depression, unspecified: Secondary | ICD-10-CM

## 2023-04-24 DIAGNOSIS — Z0000 Encounter for general adult medical examination without abnormal findings: Secondary | ICD-10-CM

## 2023-04-24 LAB — LIPID PANEL
Cholesterol: 172 mg/dL (ref 0–200)
HDL: 42.1 mg/dL (ref >39.00)
NonHDL: 129.9
Total CHOL/HDL Ratio: 4
Triglycerides: 250 mg/dL — ABNORMAL HIGH (ref 0.0–149.0)
VLDL: 50 mg/dL — ABNORMAL HIGH (ref 0.0–40.0)

## 2023-04-24 LAB — CBC
HCT: 39.9 % (ref 36.0–46.0)
Hemoglobin: 12.6 g/dL (ref 12.0–15.0)
MCHC: 31.7 g/dL (ref 30.0–36.0)
MCV: 81.3 fl (ref 78.0–100.0)
Platelets: 336 10*3/uL (ref 150.0–400.0)
RBC: 4.91 Mil/uL (ref 3.87–5.11)
RDW: 14.7 % (ref 11.5–15.5)
WBC: 6.8 10*3/uL (ref 4.0–10.5)

## 2023-04-24 LAB — COMPREHENSIVE METABOLIC PANEL
ALT: 15 U/L (ref 0–35)
AST: 10 U/L (ref 0–37)
Albumin: 4 g/dL (ref 3.5–5.2)
Alkaline Phosphatase: 73 U/L (ref 39–117)
BUN: 11 mg/dL (ref 6–23)
CO2: 30 mEq/L (ref 19–32)
Calcium: 9.6 mg/dL (ref 8.4–10.5)
Chloride: 102 mEq/L (ref 96–112)
Creatinine, Ser: 0.57 mg/dL (ref 0.40–1.20)
GFR: 101.68 mL/min (ref >60.00)
Glucose, Bld: 218 mg/dL — ABNORMAL HIGH (ref 70–99)
Potassium: 4 mEq/L (ref 3.5–5.1)
Sodium: 142 mEq/L (ref 135–145)
Total Bilirubin: 0.4 mg/dL (ref 0.2–1.2)
Total Protein: 7 g/dL (ref 6.0–8.3)

## 2023-04-24 LAB — LDL CHOLESTEROL, DIRECT: Direct LDL: 102 mg/dL

## 2023-04-24 MED ORDER — MONTELUKAST SODIUM 10 MG PO TABS: ORAL_TABLET | ORAL | 1 refills | Status: DC

## 2023-04-24 MED ORDER — AMLODIPINE BESYLATE 10 MG PO TABS: 10.0000 mg | ORAL_TABLET | Freq: Every day | ORAL | 3 refills | Status: DC

## 2023-04-24 MED ORDER — ATORVASTATIN CALCIUM 40 MG PO TABS: 40.0000 mg | ORAL_TABLET | Freq: Every day | ORAL | 2 refills | Status: DC

## 2023-04-24 MED ORDER — ESCITALOPRAM OXALATE 20 MG PO TABS: 20.0000 mg | ORAL_TABLET | Freq: Every day | ORAL | 3 refills | Status: DC

## 2023-04-24 MED ORDER — TRIAMTERENE-HCTZ 37.5-25 MG PO TABS
1.0000 | ORAL_TABLET | Freq: Every day | ORAL | 1 refills | Status: DC
Start: 2023-04-24 — End: 2023-10-17

## 2023-04-24 NOTE — Progress Notes (Signed)
Established Patient Office Visit   Subjective:  Patient ID: Holly Ortiz, female    DOB: November 11, 1966  Age: 57 y.o. MRN: [id number]  Chief Complaint  Patient presents with   Medical Management of Chronic Issues    Routine follow up refill on medication, no concerns. Patient fasting.     HPI Encounter Diagnoses  Name Primary?   Essential hypertension    Elevated cholesterol    Anxiety and depression    Urticaria    For follow-up of above.  She is accompanied by her husband.  She does have regular dental care.  No regular exercise.  She is seeing ophthalmology twice yearly.  She is up-to-date on women's health.  Continues follow-up with endocrinology.  She is working full-time.  Continues Lexapro 10 for anxiety with depression.  Continues Singulair 10 mg daily without issue for control of urticaria.   Review of Systems  Constitutional: Negative.   HENT: Negative.    Eyes:  Negative for blurred vision, discharge and redness.  Respiratory: Negative.    Cardiovascular: Negative.   Gastrointestinal:  Negative for abdominal pain.  Genitourinary: Negative.   Musculoskeletal: Negative.  Negative for myalgias.  Skin:  Negative for rash.  Neurological:  Negative for tingling, loss of consciousness and weakness.  Endo/Heme/Allergies:  Negative for polydipsia.      04/24/2023    8:34 AM 09/22/2022    8:27 AM 03/23/2022    3:13 PM  Depression screen PHQ 2/9  Decreased Interest 0 0 0  Down, Depressed, Hopeless 0 0 0  PHQ - 2 Score 0 0 0       Current Outpatient Medications:    acetaminophen (TYLENOL) 500 MG tablet, Take 2 tablets (1,000 mg total) by mouth every 6 (six) hours as needed., Disp: 60 tablet, Rfl: 0   Cholecalciferol (VITAMIN D) 50 MCG (2000 UT) CAPS, Take 2,000 Units by mouth daily. , Disp: , Rfl:    cyclobenzaprine (FLEXERIL) 5 MG tablet, TAKE 1 TABLET BY MOUTH THREE TIMES A DAY AS NEEDED FOR MUSCLE SPASMS, Disp: 30 tablet, Rfl: 2   FARXIGA 10 MG TABS tablet, TAKE  1 TABLET BY MOUTH EVERY DAY, Disp: 90 tablet, Rfl: 1   fenofibrate (TRICOR) 145 MG tablet, Take 1 tablet (145 mg total) by mouth daily., Disp: 90 tablet, Rfl: 1   hydroxychloroquine (PLAQUENIL) 200 MG tablet, Take 2 tablets (400 mg total) by mouth daily., Disp: 180 tablet, Rfl: 1   Insulin Pen Needle (BD PEN NEEDLE NANO 2ND GEN) 32G X 4 MM MISC, Use four times daily to inject insulin., Disp: 200 each, Rfl: 11   insulin regular human CONCENTRATED (HUMULIN R U-500 KWIKPEN) 500 UNIT/ML KwikPen, INJECT 45 UNITS UNDER THE SKIN BEFORE LUNCH, 20-25 UNITS AT SUPPER AND 30 UNITS AT BEDTIME, Disp: 18 mL, Rfl: 2   Melatonin 5 MG TABS, Take 5 mg by mouth at bedtime as needed., Disp: , Rfl:    MIEBO 1.338 GM/ML SOLN, Apply 1 drop to eye 4 (four) times daily., Disp: , Rfl:    polyethylene glycol (MIRALAX / GLYCOLAX) 17 g packet, Take 17 g by mouth daily., Disp: , Rfl:    triamcinolone ointment (KENALOG) 0.5 %, Apply 1 Application topically 2 (two) times daily as needed., Disp: 30 g, Rfl: 0   vitamin B-12 (CYANOCOBALAMIN) 1000 MCG tablet, Take 1,000 mcg by mouth daily., Disp: , Rfl:    zinc gluconate 50 MG tablet, Take 50 mg by mouth daily. , Disp: , Rfl:    amLODipine (  NORVASC) 10 MG tablet, Take 1 tablet (10 mg total) by mouth daily., Disp: 90 tablet, Rfl: 3   atorvastatin (LIPITOR) 40 MG tablet, Take 1 tablet (40 mg total) by mouth daily., Disp: 90 tablet, Rfl: 2   escitalopram (LEXAPRO) 20 MG tablet, Take 1 tablet (20 mg total) by mouth daily., Disp: 90 tablet, Rfl: 3   montelukast (SINGULAIR) 10 MG tablet, TAKE 1 TABLET BY MOUTH EVERYDAY AT BEDTIME, Disp: 90 tablet, Rfl: 1   triamterene-hydrochlorothiazide (MAXZIDE-25) 37.5-25 MG tablet, Take 1 tablet by mouth daily., Disp: 90 tablet, Rfl: 1  Current Facility-Administered Medications:    0.9 %  sodium chloride infusion, 500 mL, Intravenous, Once, Cunningham, Scott E, MD   Objective:     BP 138/68 (BP Location: Right Arm, Patient Position: Sitting, Cuff  Size: Large)   Pulse 99   Temp 97.8 F (36.6 C) (Temporal)   Ht 5\' 3"  (1.6 m)   Wt 200 lb 12.8 oz (91.1 kg)   LMP 05/13/2019 Comment: patient had not had her period in a year and it came back in May and June 2020  SpO2 96%   BMI 35.57 kg/m    Physical Exam Constitutional:      General: She is not in acute distress.    Appearance: Normal appearance. She is not ill-appearing, toxic-appearing or diaphoretic.  HENT:     Head: Normocephalic and atraumatic.     Right Ear: Tympanic membrane, ear canal and external ear normal.     Left Ear: Tympanic membrane, ear canal and external ear normal.     Mouth/Throat:     Mouth: Mucous membranes are moist.     Pharynx: Oropharynx is clear. No oropharyngeal exudate or posterior oropharyngeal erythema.  Eyes:     General: No scleral icterus.       Right eye: No discharge.        Left eye: No discharge.     Extraocular Movements: Extraocular movements intact.     Conjunctiva/sclera: Conjunctivae normal.     Pupils: Pupils are equal, round, and reactive to light.  Cardiovascular:     Rate and Rhythm: Normal rate and regular rhythm.     Pulses:          Dorsalis pedis pulses are 1+ on the right side and 1+ on the left side.       Posterior tibial pulses are 1+ on the right side and 1+ on the left side.  Pulmonary:     Effort: Pulmonary effort is normal. No respiratory distress.     Breath sounds: Normal breath sounds.  Abdominal:     General: Bowel sounds are normal.  Musculoskeletal:     Cervical back: No rigidity or tenderness.  Skin:    General: Skin is warm and dry.  Neurological:     Mental Status: She is alert and oriented to person, place, and time.  Psychiatric:        Mood and Affect: Mood normal.        Behavior: Behavior normal.    Diabetic Foot Exam - Simple   Simple Foot Form Diabetic Foot exam was performed with the following findings: Yes 04/24/2023 10:12 AM  Visual Inspection No deformities, no ulcerations, no other  skin breakdown bilaterally: Yes Sensation Testing Intact to touch and monofilament testing bilaterally: Yes Pulse Check Posterior Tibialis and Dorsalis pulse intact bilaterally: Yes Comments     Diabetic Foot Exam - Simple   No data filed      No  results found for any visits on 04/24/23.    The 10-year ASCVD risk score (Arnett DK, et al., 2019) is: 7%    Assessment & Plan:   Essential hypertension -     amLODIPine Besylate; Take 1 tablet (10 mg total) by mouth daily.  Dispense: 90 tablet; Refill: 3 -     Triamterene-HCTZ; Take 1 tablet by mouth daily.  Dispense: 90 tablet; Refill: 1 -     CBC -     Comprehensive metabolic panel -     Urinalysis, Routine w reflex microscopic  Elevated cholesterol -     Atorvastatin Calcium; Take 1 tablet (40 mg total) by mouth daily.  Dispense: 90 tablet; Refill: 2 -     Comprehensive metabolic panel -     Lipid panel  Anxiety and depression -     Escitalopram Oxalate; Take 1 tablet (20 mg total) by mouth daily.  Dispense: 90 tablet; Refill: 3  Urticaria -     Montelukast Sodium; TAKE 1 TABLET BY MOUTH EVERYDAY AT BEDTIME  Dispense: 90 tablet; Refill: 1    Return in about 6 months (around 10/25/2023), or Must follow-up with endocrinology for all diabetes related medications..  Encouraged regular exercise by walking for 30 minutes most days of the week.  Encouraged regular follow-up with endocrinology.  Continue all medications as above.  Information was given on health maintenance and disease prevention as well as exercising to lose weight.  Mliss Sax, MD

## 2023-04-26 ENCOUNTER — Encounter: Payer: Self-pay | Admitting: Internal Medicine

## 2023-04-26 DIAGNOSIS — F32A Depression, unspecified: Secondary | ICD-10-CM

## 2023-04-27 MED ORDER — DULOXETINE HCL 30 MG PO CPEP
30.0000 mg | ORAL_CAPSULE | Freq: Every day | ORAL | 0 refills | Status: DC
Start: 2023-04-27 — End: 2023-05-03

## 2023-04-27 NOTE — Telephone Encounter (Signed)
Patient has been prescribed Flexeril, Hydroxychloroquine, and Kenalog cream by Dr. Dimple Casey in the past. Please advise.

## 2023-05-02 ENCOUNTER — Other Ambulatory Visit: Payer: Self-pay

## 2023-05-02 ENCOUNTER — Encounter (HOSPITAL_COMMUNITY): Payer: Self-pay | Admitting: Emergency Medicine

## 2023-05-02 ENCOUNTER — Emergency Department (HOSPITAL_COMMUNITY): Payer: 59

## 2023-05-02 ENCOUNTER — Emergency Department (HOSPITAL_COMMUNITY)
Admission: EM | Admit: 2023-05-02 | Discharge: 2023-05-02 | Disposition: A | Discharge status: Home / Self Care | Payer: 59 | Attending: Emergency Medicine | Admitting: Emergency Medicine

## 2023-05-02 DIAGNOSIS — R519 Headache, unspecified: Secondary | ICD-10-CM

## 2023-05-02 DIAGNOSIS — Z79899 Other long term (current) drug therapy: Secondary | ICD-10-CM | POA: Diagnosis not present

## 2023-05-02 DIAGNOSIS — M549 Dorsalgia, unspecified: Secondary | ICD-10-CM | POA: Insufficient documentation

## 2023-05-02 DIAGNOSIS — Z794 Long term (current) use of insulin: Secondary | ICD-10-CM | POA: Insufficient documentation

## 2023-05-02 DIAGNOSIS — M542 Cervicalgia: Secondary | ICD-10-CM | POA: Insufficient documentation

## 2023-05-02 DIAGNOSIS — W19XXXA Unspecified fall, initial encounter: Secondary | ICD-10-CM

## 2023-05-02 DIAGNOSIS — W108XXA Fall (on) (from) other stairs and steps, initial encounter: Secondary | ICD-10-CM | POA: Diagnosis not present

## 2023-05-02 NOTE — Progress Notes (Signed)
Office Visit Note  Patient: Holly Ortiz             Date of Birth: 1966/10/29           MRN: [id number]             PCP: Mliss Sax, MD Referring: Mliss Sax,* Visit Date: 05/03/2023   Subjective:  Follow-up (Patient states she has had increased headaches. )   History of Present Illness: Holly Ortiz is a 57 y.o. 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  Her previously worsened widespread symptoms of fatigue did get somewhat better after our visit last month feels the duloxetine was beneficial for joint pains also with her brain fog.  Her prescription ran out before this visit and so she came off of it and noticed some worsening in pain again.  But she suffered a fall 3 weeks ago down the stairs and hit the back of her head.  Initially did not think much of it subsequently has been feeling worsening headaches in the back of the head and pain in her neck area.  Also is noticing numbness radiating down to the fourth and fifth fingers in both hands.  She was seen at the ED and had a CT scan with no evidence of cervical spine injury and no hemorrhage.  Previous HPI 03/12/23 Holly Ortiz is a 57 y.o. female here for follow up for UCTD on hydroxychloroquine 400 mg daily symptoms currently in exacerbation for the past 3 weeks.  She is having increased pain in multiple areas neck shoulders arms as well as the low back and tailbone region.  She is feeling persistently fatigued every day also increased headaches.  She notices some more swelling in both hands then has been present for at least the past year.  She does describe very poor sleep during these few weeks and a very high level of work stress contributing.   Previous HPI 01/25/23 Holly Ortiz is a 57 y.o. female here for follow up for UCTD with inflammatory arthritis and chronic urticaria on hydroxychloroquine 400 mg daily.  Past few months she  reports worsening fatigue and concentration and short-term memory problems.  Also increased pain especially in the low back bilaterally.  Does not have pain or numbness radiation into the legs.  She is experienced some weakness and difficulty standing back up from the floor or crouched position.  Frequently wakes up with low back pain that improves after taking 2 regular Advil and moving around for a while.  Has not had severe outbreak of hives but keeps getting a small number of these around the neck and back of the scalp.  Follow-up with primary care office in December still had poorly controlled diabetes with hemoglobin A1c 11.8.  Skin rash behind the right knee is unchanged has a new erythematous rash in the right elbow. She has been under increased stress with work changes after her [employer] branch angry and was close now working in [place] still getting used to the change in location and coworkers.   Previous HPI 07/31/22 Holly Ortiz is a 57 y.o. female here for follow up for UCTD with symptoms of inflammatory arthritis and chronic urticaria on HCQ 400 mg daily. She has generally been doing well without severe flare ups. She has followed up with her ophthalmologist but not remembered to specifically address her HCQ screening exam. Since last week she is having new left flank pain  radiating around the side somewhat along bottom of ribs. She does not recall any preceding illness or injury. She does notice increased urinary frequency during this time. No dysuria or gross hematuria. She has a history of a kidney stone once about 30 years ago during pregnancy and last known UTI about 10 years ago. She has a new dog and has a lot of new scratches from this.   Previous HPI 01/31/2022 Holly Ortiz is a 57 y.o. female here for follow up for inflammatory arthritis and chronic urticaria on HCQ 400 mg daily. She had a significant flare up of symptoms around Thanksgiving that resolved after a  couple weeks without specific intervention. She takes advil as needed when joint pain and swelling are worse and when really bad sometimes flexeril. She had another shorter episode of symptoms for about 4 days earlier this year. Most common joint swelling lately is at the 3rd knuckle of each hand, knee swelling has been less common. She did not have many episodes of hives outside of these flares. She is starting to have nasal congestion and hoarseness of voice she thinks is seasonal allergies returning.   Previous HPI 08/02/21 Holly Ortiz is a 57 y.o. female here for follow up for inflammatory arthritis and chronic urticarial rashes on hydroxychloroquine 400 mg PO daily. Due to low back and knee pain that seemed noninflammatory also recommended trial of oral muscle relaxant as needed at night.  So far she feels a very good improvement in her overall symptoms.  She has not had any recurrence of skin rash since at least 2 months ago.  Joint pains are doing well.  She still has some of the fatigue and concentration difficulty though this is also partially better.   02/22/21 Holly Ortiz is a 57 y.o. female here for evaluation of positive ANA with chronic joint pains, fatigue, and intermittent hives for years. She describes symptoms starting since around 2007. She has had pain in joints and muscles at various areas to some extent most of the time. She also developed a problem with recurrent urticaria rashes at multiple areas, most often after increased stress or other provocative events such as infection and antibiotic use. Since that time she has had workup with allergy and immunology clinics and dermatology clinic with no specific cause able to be found. A more recent symptom is change in sensation in her legs and feet. She notices swelling and discomfort after prolonged standing or prolonged sitting. She had a few episodes of becoming dizzy and unstable after standing and a few falls.   Labs  reviewed 01/2021 ANA 1:80 homogenous, speckled CBC wnl TSH wnl CK wnl CRP wnl   Review of Systems  Constitutional:  Positive for fatigue.  HENT:  Positive for mouth dryness. Negative for mouth sores.   Eyes:  Negative for dryness.  Respiratory:  Positive for shortness of breath.   Cardiovascular:  Negative for chest pain and palpitations.  Gastrointestinal:  Positive for constipation and diarrhea. Negative for blood in stool.  Endocrine: Positive for increased urination.  Genitourinary:  Positive for involuntary urination.  Musculoskeletal:  Positive for joint pain, gait problem, joint pain, myalgias, muscle weakness, morning stiffness, muscle tenderness and myalgias. Negative for joint swelling.  Skin:  Positive for rash and sensitivity to sunlight. Negative for color change and hair loss.  Allergic/Immunologic: Negative for susceptible to infections.  Neurological:  Positive for headaches. Negative for dizziness.  Hematological:  Negative for swollen glands.  Psychiatric/Behavioral:  Negative for depressed  mood and sleep disturbance. The patient is nervous/anxious.     PMFS History:  Patient Active Problem List   Diagnosis Date Noted   Headache 05/03/2023   Other fatigue 01/25/2023   Rash and other nonspecific skin eruption 01/25/2023   Urinary frequency 08/02/2022   Left flank pain 07/31/2022   Pain in left knee 05/03/2021   Arthralgia 02/24/2021   Undifferentiated connective tissue disease (HCC) 02/22/2021   Iron deficiency 07/07/2020   Left low back pain 01/14/2020   Anemia 12/02/2019   Lightheadedness 12/02/2019   Menorrhagia 08/07/2019   Abnormal uterine bleeding 08/07/2019   Anxiety and depression 07/03/2018   DKA (diabetic ketoacidoses) 03/28/2018   Tachycardia 03/28/2018   Urticaria 03/28/2018   Hyperosmolar hyperglycemia 01/04/2018   AKI (acute kidney injury) (HCC) 01/04/2018   Hyperglycemia 01/03/2018   Angioedema due to angiotensin converting enzyme  inhibitor (ACE-I)    Asthma    Type 2 diabetes mellitus with complication, with long-term current use of insulin (HCC)    Elevated cholesterol    Essential hypertension     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-I)    Anxiety    on meds   Asthma    childhood   Cataract 2015   bilateral   Constipation    takes Miralax daily   Depression    on meds   Diabetes mellitus    type II-on meds   Hyperlipidemia    on meds   Hypertension    on meds   Lupus (HCC) 2022   dx 02/2021   Seasonal allergies    Urticaria, chronic    UTI (lower urinary tract infection)     Family History  Problem Relation Age of Onset   Colon polyps Mother 16   Colon cancer Mother 84   Hypertension Mother    Diabetes Father    Hypertension Father    COPD Father        Cause of death   Hypertension Sister    Diabetes Sister    Cancer Sister        Brain glioblastoma   Diabetes Sister    Hypertension Brother    Hyperlipidemia Other    Heart disease Other    Esophageal cancer Neg Hx    Stomach cancer Neg Hx    Rectal cancer Neg Hx    Past Surgical History:  Procedure Laterality Date   ABDOMINAL HYSTERECTOMY  08/07/2019   BREAST ENHANCEMENT SURGERY  1993   breast lift/reduction   CATARACT EXTRACTION, BILATERAL Bilateral 2014   CATARACT EXTRACTION, BILATERAL Bilateral 04/2022   COLONOSCOPY  2014   in [place]   LAPAROSCOPIC VAGINAL HYSTERECTOMY WITH SALPINGO OOPHORECTOMY Bilateral 08/07/2019   Procedure: LAPAROSCOPIC ASSISTED VAGINAL HYSTERECTOMY WITH SALPINGO OOPHORECTOMY, possible abdominal hysterectomy;  Surgeon: Harold Hedge, MD;  Location: [facility] OR;  Service: Gynecology;  Laterality: Bilateral;  possible abdominal hysterectomy pt is diabetic and hypertensive   SOFT TISSUE MASS EXCISION Right 2016   [place]   TUBAL LIGATION  1995   tummy tuck  1993   Social History   Social History Narrative   Not on file   Immunization  History  Administered Date(s) Administered   DTaP 07/04/2009   Influenza,inj,Quad PF,6+ Mos 01/05/2018, 08/28/2018, 09/05/2019, 10/05/2020   PFIZER(Purple Top)SARS-COV-2 Vaccination 02/03/2020, 03/02/2020   Pneumococcal Polysaccharide-23 01/05/2018   Tdap 12/04/2012   Zoster Recombinat (Shingrix) 06/29/2021     Objective: Vital Signs: BP 139/79 (BP Location: Left  Arm, Patient Position: Sitting, Cuff Size: Normal)   Pulse 99   Resp 14   Ht 5\' 3"  (1.6 m)   Wt 202 lb (91.6 kg)   LMP 05/13/2019 Comment: patient had not had her period in a year and it came back in May and June 2020  BMI 35.78 kg/m    Physical Exam Constitutional:      Appearance: She is obese.  Cardiovascular:     Rate and Rhythm: Normal rate and regular rhythm.  Pulmonary:     Effort: Pulmonary effort is normal.     Breath sounds: Normal breath sounds.  Musculoskeletal:     Right lower leg: No edema.     Left lower leg: No edema.  Skin:    General: Skin is warm and dry.     Findings: No rash.  Neurological:     Mental Status: She is alert.  Psychiatric:        Mood and Affect: Mood normal.      Musculoskeletal Exam:  Neck full ROM firm nodule palpable on posterior left side of neck near base of occiput Diffuse tenderness to pressure across trapezius muscles extending to around top and back of shoulders Left shoulder range of motion is slightly restricted and with some muscle guarding, right range of motion normal, no radiating pain with movement Elbows full ROM no tenderness or swelling Wrists full ROM no tenderness or swelling Fingers full ROM no tenderness or swelling Knees full ROM no tenderness or swelling   Investigation: No additional findings.  Imaging: CT Head Wo Contrast  Result Date: 05/02/2023 CLINICAL DATA:  Head trauma, coagulopathy (Age 40-64y); Neck trauma, dangerous injury mechanism (Age 15-64y). Fall. EXAM: CT HEAD WITHOUT CONTRAST CT CERVICAL SPINE WITHOUT CONTRAST TECHNIQUE:  Multidetector CT imaging of the head and cervical spine was performed following the standard protocol without intravenous contrast. Multiplanar CT image reconstructions of the cervical spine were also generated. RADIATION DOSE REDUCTION: This exam was performed according to the departmental dose-optimization program which includes automated exposure control, adjustment of the mA and/or kV according to patient size and/or use of iterative reconstruction technique. COMPARISON:  None Available. FINDINGS: CT HEAD FINDINGS Brain: No acute intracranial hemorrhage. Gray-white differentiation is preserved. No hydrocephalus or extra-axial collection. No mass effect or midline shift. Vascular: No hyperdense vessel or unexpected calcification. Skull: No calvarial fracture or suspicious bone lesion. Skull base is unremarkable. Sinuses/Orbits: Unremarkable. Other: None. CT CERVICAL SPINE FINDINGS Alignment: Normal. Skull base and vertebrae: No acute fracture. Normal craniocervical junction. No suspicious bone lesions. Soft tissues and spinal canal: No prevertebral fluid or swelling. No visible canal hematoma. Disc levels:  No significant degenerative change. Upper chest: Unremarkable. Other: Atherosclerotic calcifications of the carotid bulbs. IMPRESSION: 1. No acute intracranial process. 2. No acute fracture or traumatic malalignment of the cervical spine. Electronically Signed   By: Orvan Falconer M.D.   On: 05/02/2023 10:41   CT Cervical Spine Wo Contrast  Result Date: 05/02/2023 CLINICAL DATA:  Head trauma, coagulopathy (Age 68-64y); Neck trauma, dangerous injury mechanism (Age 25-64y). Fall. EXAM: CT HEAD WITHOUT CONTRAST CT CERVICAL SPINE WITHOUT CONTRAST TECHNIQUE: Multidetector CT imaging of the head and cervical spine was performed following the standard protocol without intravenous contrast. Multiplanar CT image reconstructions of the cervical spine were also generated. RADIATION DOSE REDUCTION: This exam was  performed according to the departmental dose-optimization program which includes automated exposure control, adjustment of the mA and/or kV according to patient size and/or use of iterative reconstruction technique.  COMPARISON:  None Available. FINDINGS: CT HEAD FINDINGS Brain: No acute intracranial hemorrhage. Gray-white differentiation is preserved. No hydrocephalus or extra-axial collection. No mass effect or midline shift. Vascular: No hyperdense vessel or unexpected calcification. Skull: No calvarial fracture or suspicious bone lesion. Skull base is unremarkable. Sinuses/Orbits: Unremarkable. Other: None. CT CERVICAL SPINE FINDINGS Alignment: Normal. Skull base and vertebrae: No acute fracture. Normal craniocervical junction. No suspicious bone lesions. Soft tissues and spinal canal: No prevertebral fluid or swelling. No visible canal hematoma. Disc levels:  No significant degenerative change. Upper chest: Unremarkable. Other: Atherosclerotic calcifications of the carotid bulbs. IMPRESSION: 1. No acute intracranial process. 2. No acute fracture or traumatic malalignment of the cervical spine. Electronically Signed   By: Orvan Falconer M.D.   On: 05/02/2023 10:41    Recent Labs: Lab Results  Component Value Date   WBC 6.8 04/24/2023   HGB 12.6 04/24/2023   PLT 336.0 04/24/2023   NA 142 04/24/2023   K 4.0 04/24/2023   CL 102 04/24/2023   CO2 30 04/24/2023   GLUCOSE 218 (H) 04/24/2023   BUN 11 04/24/2023   CREATININE 0.57 04/24/2023   BILITOT 0.4 04/24/2023   ALKPHOS 73 04/24/2023   AST 10 04/24/2023   ALT 15 04/24/2023   PROT 7.0 04/24/2023   ALBUMIN 4.0 04/24/2023   CALCIUM 9.6 04/24/2023   GFRAA >60 07/30/2019    Speciality Comments: PLQ Eye Exam: 10/12/2022 WNL @ [facility]  Procedures:  No procedures performed Allergies: Ozempic (0.25 or 0.5 mg-dose) [semaglutide(0.25 or 0.5mg -dos)], Actos [pioglitazone hydrochloride], Lisinopril, and Metformin and related    Assessment / Plan:     Visit Diagnoses: Undifferentiated connective tissue disease (HCC)  Inflammatory symptoms appear well-controlled headache better explained by the recent fall probable low-grade injury.  Plan to continue hydroxychloroquine 400 mg daily.  She had high inflammatory markers I will not recheck these today I suspect they would be affected by recent minor injury as there is still some palpable swelling in the neck region.  Acute nonintractable headache, unspecified headache type Low back pain, unspecified - Plan: cyclobenzaprine (FLEXERIL) 5 MG tablet  Chronic back pain and now with pain in neck and upper shoulder area more consistent with myofascial pain.  I think she had a minor strain injury in the back of the neck now with cervicogenic headache.  Discussed range of motion exercises focusing on scapular stabilization and neck mobility.  Also recommend addition of Flexeril 5 mg daily at night as needed previously had some benefit in her back pain is on muscle relaxer.  Anxiety and depression - Plan: DULoxetine (CYMBALTA) 30 MG capsule  Will resume the duloxetine 30 mg daily which was giving some benefit for anxiety symptoms also some improvement with chronic pain.  Probably indicative some degree of fibromyalgia and/or myofascial pain component with her current symptoms that is also been worse during recent injuries and stressful work situation.     Orders: No orders of the defined types were placed in this encounter.  Meds ordered this encounter  Medications   DULoxetine (CYMBALTA) 30 MG capsule    Sig: Take 1 capsule (30 mg total) by mouth daily.    Dispense:  90 capsule    Refill:  0   cyclobenzaprine (FLEXERIL) 5 MG tablet    Sig: Take 1 tablet (5 mg total) by mouth at bedtime as needed for muscle spasms.    Dispense:  30 tablet    Refill:  1     Follow-Up  Instructions: Return in about 3 months (around 08/03/2023) for UCTD/headaches/FMS on HCQ f/u  3mos.   Fuller Plan, MD  Note - This record has been created using [employer].  Chart creation errors have been sought, but may not always  have been located. Such creation errors do not reflect on  the standard of medical care.

## 2023-05-02 NOTE — ED Provider Notes (Signed)
[place] [facility] DEPARTMENT AT [facility] Provider Note   CSN: [id number] Arrival date & time: 05/02/23  0865     History  Chief Complaint  Patient presents with   Marletta Lor    Holly Ortiz is a 57 y.o. female.  HPI    26 female presents today complaining head pain.  She fell down the steps 1 week ago.  She is fell out approximately 13 steps.  She struck her head but did not lose conscious.  She is not on any blood thinners.  States she has pain from her head radiating to her neck.  She denies any numbness tingling or weakness.  She was able to ambulate after the event and did not have any pain in her hips or legs.  She denies any chest pain, dyspnea, abdominal pain, lightheadedness.  She states that she came in today because the pain has continued to been treating herself with over-the-counter medications at home. Home Medications Prior to Admission medications   Medication Sig Start Date End Date Taking? Authorizing Provider  DULoxetine (CYMBALTA) 30 MG capsule Take 1 capsule (30 mg total) by mouth daily. 04/27/23   Rice, Jamesetta Orleans, MD  acetaminophen (TYLENOL) 500 MG tablet Take 2 tablets (1,000 mg total) by mouth every 6 (six) hours as needed. 08/08/19   Harold Hedge, MD  amLODipine (NORVASC) 10 MG tablet Take 1 tablet (10 mg total) by mouth daily. 04/24/23   Mliss Sax, MD  atorvastatin (LIPITOR) 40 MG tablet Take 1 tablet (40 mg total) by mouth daily. 04/24/23   Mliss Sax, MD  Cholecalciferol (VITAMIN D) 50 MCG (2000 UT) CAPS Take 2,000 Units by mouth daily.     [provider]  cyclobenzaprine (FLEXERIL) 5 MG tablet TAKE 1 TABLET BY MOUTH THREE TIMES A DAY AS NEEDED FOR MUSCLE SPASMS 05/16/22   Rice, Jamesetta Orleans, MD  escitalopram (LEXAPRO) 20 MG tablet Take 1 tablet (20 mg total) by mouth daily. 04/24/23   Mliss Sax, MD  FARXIGA 10 MG TABS tablet TAKE 1 TABLET BY MOUTH EVERY DAY 09/28/22   Reather Littler, MD  fenofibrate  (TRICOR) 145 MG tablet Take 1 tablet (145 mg total) by mouth daily. 02/08/21   Reather Littler, MD  hydroxychloroquine (PLAQUENIL) 200 MG tablet Take 2 tablets (400 mg total) by mouth daily. 01/25/23   Rice, Jamesetta Orleans, MD  Insulin Pen Needle (BD PEN NEEDLE NANO 2ND GEN) 32G X 4 MM MISC Use four times daily to inject insulin. 07/30/19   Reather Littler, MD  insulin regular human CONCENTRATED (HUMULIN R U-500 KWIKPEN) 500 UNIT/ML KwikPen INJECT 45 UNITS UNDER THE SKIN BEFORE LUNCH, 20-25 UNITS AT SUPPER AND 30 UNITS AT BEDTIME 04/13/22   Reather Littler, MD  Melatonin 5 MG TABS Take 5 mg by mouth at bedtime as needed.    [provider]  MIEBO 1.338 GM/ML SOLN Apply 1 drop to eye 4 (four) times daily. 11/10/22   [provider]  montelukast (SINGULAIR) 10 MG tablet TAKE 1 TABLET BY MOUTH EVERYDAY AT BEDTIME 04/24/23   Mliss Sax, MD  polyethylene glycol (MIRALAX / GLYCOLAX) 17 g packet Take 17 g by mouth daily.    [provider]  triamcinolone ointment (KENALOG) 0.5 % Apply 1 Application topically 2 (two) times daily as needed. 01/25/23   Fuller Plan, MD  triamterene-hydrochlorothiazide (MAXZIDE-25) 37.5-25 MG tablet Take 1 tablet by mouth daily. 04/24/23   Mliss Sax, MD  vitamin B-12 (CYANOCOBALAMIN) 1000  MCG tablet Take 1,000 mcg by mouth daily.    [provider]  zinc gluconate 50 MG tablet Take 50 mg by mouth daily.     [provider]      Allergies    Ozempic (0.25 or 0.5 mg-dose) [semaglutide(0.25 or 0.5mg -dos)], Actos [pioglitazone hydrochloride], Lisinopril, and Metformin and related    Review of Systems   Review of Systems  Physical Exam Updated Vital Signs BP (!) 154/84   Pulse (!) 102   Temp 98.3 F (36.8 C)   Resp 17   LMP 05/13/2019 Comment: patient had not had her period in a year and it came back in May and June 2020  SpO2 99%  Physical Exam Vitals and nursing note reviewed.  Constitutional:      General: She  is not in acute distress.    Appearance: She is well-developed.  HENT:     Head: Normocephalic and atraumatic.     Right Ear: External ear normal.     Left Ear: External ear normal.     Nose: Nose normal.  Eyes:     Conjunctiva/sclera: Conjunctivae normal.     Pupils: Pupils are equal, round, and reactive to light.  Neck:     Comments: Diffuse tenderness of cervical spine. No obvious trauma No external trauma noted on visual inspection of neck Cardiovascular:     Rate and Rhythm: Normal rate and regular rhythm.  Pulmonary:     Effort: Pulmonary effort is normal.  Abdominal:     Palpations: Abdomen is soft.  Musculoskeletal:        General: Normal range of motion.     Cervical back: Normal range of motion and neck supple.  Skin:    General: Skin is warm and dry.  Neurological:     Mental Status: She is alert and oriented to person, place, and time.     Motor: No abnormal muscle tone.     Coordination: Coordination normal.  Psychiatric:        Behavior: Behavior normal.        Thought Content: Thought content normal.     ED Results / Procedures / Treatments   Labs (all labs ordered are listed, but only abnormal results are displayed) Labs Reviewed - No data to display  EKG None  Radiology CT Head Wo Contrast  Result Date: 05/02/2023 CLINICAL DATA:  Head trauma, coagulopathy (Age 47-64y); Neck trauma, dangerous injury mechanism (Age 25-64y). Fall. EXAM: CT HEAD WITHOUT CONTRAST CT CERVICAL SPINE WITHOUT CONTRAST TECHNIQUE: Multidetector CT imaging of the head and cervical spine was performed following the standard protocol without intravenous contrast. Multiplanar CT image reconstructions of the cervical spine were also generated. RADIATION DOSE REDUCTION: This exam was performed according to the departmental dose-optimization program which includes automated exposure control, adjustment of the mA and/or kV according to patient size and/or use of iterative reconstruction  technique. COMPARISON:  None Available. FINDINGS: CT HEAD FINDINGS Brain: No acute intracranial hemorrhage. Gray-white differentiation is preserved. No hydrocephalus or extra-axial collection. No mass effect or midline shift. Vascular: No hyperdense vessel or unexpected calcification. Skull: No calvarial fracture or suspicious bone lesion. Skull base is unremarkable. Sinuses/Orbits: Unremarkable. Other: None. CT CERVICAL SPINE FINDINGS Alignment: Normal. Skull base and vertebrae: No acute fracture. Normal craniocervical junction. No suspicious bone lesions. Soft tissues and spinal canal: No prevertebral fluid or swelling. No visible canal hematoma. Disc levels:  No significant degenerative change. Upper chest: Unremarkable. Other: Atherosclerotic calcifications of the carotid bulbs. IMPRESSION: 1.  No acute intracranial process. 2. No acute fracture or traumatic malalignment of the cervical spine. Electronically Signed   By: Orvan Falconer M.D.   On: 05/02/2023 10:41   CT Cervical Spine Wo Contrast  Result Date: 05/02/2023 CLINICAL DATA:  Head trauma, coagulopathy (Age 45-64y); Neck trauma, dangerous injury mechanism (Age 19-64y). Fall. EXAM: CT HEAD WITHOUT CONTRAST CT CERVICAL SPINE WITHOUT CONTRAST TECHNIQUE: Multidetector CT imaging of the head and cervical spine was performed following the standard protocol without intravenous contrast. Multiplanar CT image reconstructions of the cervical spine were also generated. RADIATION DOSE REDUCTION: This exam was performed according to the departmental dose-optimization program which includes automated exposure control, adjustment of the mA and/or kV according to patient size and/or use of iterative reconstruction technique. COMPARISON:  None Available. FINDINGS: CT HEAD FINDINGS Brain: No acute intracranial hemorrhage. Gray-white differentiation is preserved. No hydrocephalus or extra-axial collection. No mass effect or midline shift. Vascular: No hyperdense vessel  or unexpected calcification. Skull: No calvarial fracture or suspicious bone lesion. Skull base is unremarkable. Sinuses/Orbits: Unremarkable. Other: None. CT CERVICAL SPINE FINDINGS Alignment: Normal. Skull base and vertebrae: No acute fracture. Normal craniocervical junction. No suspicious bone lesions. Soft tissues and spinal canal: No prevertebral fluid or swelling. No visible canal hematoma. Disc levels:  No significant degenerative change. Upper chest: Unremarkable. Other: Atherosclerotic calcifications of the carotid bulbs. IMPRESSION: 1. No acute intracranial process. 2. No acute fracture or traumatic malalignment of the cervical spine. Electronically Signed   By: Orvan Falconer M.D.   On: 05/02/2023 10:41    Procedures Procedures    Medications Ordered in ED Medications - No data to display  ED Course/ Medical Decision Making/ A&P Clinical Course as of 05/02/23 1052  Wed May 02, 2023  1040 CT Head Wo Contrast [DR]    Clinical Course User Index [DR] Margarita Grizzle, MD                             Medical Decision Making Amount and/or Complexity of Data Reviewed Radiology: ordered. Decision-making details documented in ED Course.   57 year old female who fell down multiple steps a week ago.  She had some pain in the back of her head going down her neck since that time.  Patient was evaluated here in the department with CT scan of head and neck.  This did not show any evidence of acute intracranial or cervical spine injury. Findings discussed with patient.  Discussed return precautions need for follow-up and she voices understanding.        Final Clinical Impression(s) / ED Diagnoses Final diagnoses:  Fall, initial encounter  Nonintractable headache, unspecified chronicity pattern, unspecified headache type    Rx / DC Orders ED Discharge Orders     None         Margarita Grizzle, MD 05/02/23 1052

## 2023-05-02 NOTE — ED Triage Notes (Signed)
PT reports falling down approximately 12-15 carpeted stairs 5 days ago. PT reports since that time she has been experiencing severe headaches and tingling in bilateral hands.

## 2023-05-02 NOTE — ED Notes (Signed)
Pt states headache in posterior portion and some nausea. She took two advil at home and that does help the headache.

## 2023-05-02 NOTE — ED Notes (Signed)
Patient transported to CT 

## 2023-05-02 NOTE — ED Notes (Signed)
Pt states posterior headache is worsening 5/10.

## 2023-05-03 ENCOUNTER — Encounter: Payer: Self-pay | Admitting: Internal Medicine

## 2023-05-03 ENCOUNTER — Ambulatory Visit: Payer: 59 | Attending: Internal Medicine | Admitting: Internal Medicine

## 2023-05-03 VITALS — BP 139/79 | HR 99 | Resp 14 | Ht 63.0 in | Wt 202.0 lb

## 2023-05-03 DIAGNOSIS — M359 Systemic involvement of connective tissue, unspecified: Secondary | ICD-10-CM | POA: Diagnosis not present

## 2023-05-03 DIAGNOSIS — F32A Depression, unspecified: Secondary | ICD-10-CM

## 2023-05-03 DIAGNOSIS — R519 Headache, unspecified: Secondary | ICD-10-CM

## 2023-05-03 DIAGNOSIS — F419 Anxiety disorder, unspecified: Secondary | ICD-10-CM

## 2023-05-03 DIAGNOSIS — G8929 Other chronic pain: Secondary | ICD-10-CM

## 2023-05-03 DIAGNOSIS — M545 Low back pain, unspecified: Secondary | ICD-10-CM | POA: Diagnosis not present

## 2023-05-03 MED ORDER — CYCLOBENZAPRINE HCL 5 MG PO TABS
5.0000 mg | ORAL_TABLET | Freq: Every evening | ORAL | 1 refills | Status: DC | PRN
Start: 2023-05-03 — End: 2023-07-10

## 2023-05-03 MED ORDER — DULOXETINE HCL 30 MG PO CPEP
30.0000 mg | ORAL_CAPSULE | Freq: Every day | ORAL | 0 refills | Status: DC
Start: 2023-05-27 — End: 2024-02-12

## 2023-07-06 ENCOUNTER — Telehealth: Payer: Self-pay | Admitting: Family Medicine

## 2023-07-06 NOTE — Telephone Encounter (Signed)
Pt called and stated endorcinologist and her next available appt for her is [phone] . Pt also said she will be out of her insulin and can you call her a 3 month supply in for her. Pt said you can leave a voice message in her mychart if you would like

## 2023-07-10 ENCOUNTER — Other Ambulatory Visit: Payer: Self-pay | Admitting: Internal Medicine

## 2023-07-10 DIAGNOSIS — F32A Depression, unspecified: Secondary | ICD-10-CM

## 2023-07-10 DIAGNOSIS — M545 Low back pain, unspecified: Secondary | ICD-10-CM

## 2023-07-10 NOTE — Telephone Encounter (Signed)
Last Fill: 05/03/2023  Next Visit: 07/26/2023  Last Visit: 05/03/2023  Dx: Low back pain, unspecified   Current Dose per office note on 05/03/2023: Flexeril 5 mg daily   Okay to refill Flexeril?

## 2023-07-12 ENCOUNTER — Encounter: Payer: Self-pay | Admitting: Endocrinology

## 2023-07-12 ENCOUNTER — Ambulatory Visit (INDEPENDENT_AMBULATORY_CARE_PROVIDER_SITE_OTHER): Payer: 59 | Admitting: Endocrinology

## 2023-07-12 VITALS — BP 128/70 | HR 109 | Ht 63.0 in | Wt 195.2 lb

## 2023-07-12 DIAGNOSIS — E1165 Type 2 diabetes mellitus with hyperglycemia: Secondary | ICD-10-CM

## 2023-07-12 DIAGNOSIS — Z794 Long term (current) use of insulin: Secondary | ICD-10-CM | POA: Diagnosis not present

## 2023-07-12 LAB — GLUCOSE, POCT (MANUAL RESULT ENTRY): POC Glucose: 354 mg/dl — AB (ref 70–99)

## 2023-07-12 LAB — POCT GLYCOSYLATED HEMOGLOBIN (HGB A1C): Hemoglobin A1C: 14.5 % — AB (ref 4.0–5.6)

## 2023-07-12 MED ORDER — DAPAGLIFLOZIN PROPANEDIOL 10 MG PO TABS: 10.0000 mg | ORAL_TABLET | Freq: Every day | ORAL | 3 refills | Status: DC

## 2023-07-12 MED ORDER — HUMULIN R U-500 KWIKPEN 500 UNIT/ML ~~LOC~~ SOPN
PEN_INJECTOR | SUBCUTANEOUS | 6 refills | Status: AC
Start: 2023-07-12 — End: ?

## 2023-07-12 MED ORDER — TIRZEPATIDE 2.5 MG/0.5ML ~~LOC~~ SOAJ: 2.5000 mg | SUBCUTANEOUS | 0 refills | Status: DC

## 2023-07-12 MED ORDER — TIRZEPATIDE 5 MG/0.5ML ~~LOC~~ SOAJ: 5.0000 mg | SUBCUTANEOUS | 3 refills | Status: DC

## 2023-07-12 NOTE — Patient Instructions (Addendum)
Humulin U500 : INJECT 40 UNITS UNDER THE SKIN BEFORE BREAKFAST, 20-25 UNITS BEFORE LUNCH AND 40 UNITS AT SUPPER. Farxiga 10mg  daily. Start Mounjaro 2.5mg  weekly for 4 weeks, then increase to 5mg  weekly.  Get branded glucometer and monitor glucose before meals and at bedtime and bring in office visit to download and review.    Tirzepatide Injection What is this medication? TIRZEPATIDE (tir ZEP a tide) treats type 2 diabetes. It works by increasing insulin levels in your body, which decreases your blood sugar (glucose). It also reduces the amount of sugar released into your blood and slows down your digestion. Changes to diet and exercise are often combined with this medication. This medicine may be used for other purposes; ask your health care provider or pharmacist if you have questions. COMMON BRAND NAME(S): MOUNJARO What should I tell my care team before I take this medication? They need to know if you have any of these conditions: Endocrine tumors (MEN 2) or if someone in your family had these tumors Eye disease, vision problems Gallbladder disease History of pancreatitis Kidney disease Stomach or intestine problems Thyroid cancer or if someone in your family had thyroid cancer An unusual or allergic reaction to tirzepatide, other medications, foods, dyes, or preservatives Pregnant or trying to get pregnant Breast-feeding How should I use this medication? This medication is injected under the skin. You will be taught how to prepare and give it. It is given once every week (every 7 days). Keep taking it unless your health care provider tells you to stop. If you use this medication with insulin, you should inject this medication and the insulin separately. Do not mix them together. Do not give the injections right next to each other. Change (rotate) injection sites with each injection. This medication comes with INSTRUCTIONS FOR USE. Ask your pharmacist for directions on how to use this  medication. Read the information carefully. Talk to your pharmacist or care team if you have questions. It is important that you put your used needles and syringes in a special sharps container. Do not put them in a trash can. If you do not have a sharps container, call your pharmacist or care team to get one. A special MedGuide will be given to you by the pharmacist with each prescription and refill. Be sure to read this information carefully each time. Talk to your care team about the use of this medication in children. Special care may be needed. Overdosage: If you think you have taken too much of this medicine contact a poison control center or emergency room at once. NOTE: This medicine is only for you. Do not share this medicine with others. What if I miss a dose? If you miss a dose, take it as soon as you can unless it is more than 4 days (96 hours) late. If it is more than 4 days late, skip the missed dose. Take the next dose at the normal time. Do not take 2 doses within 3 days of each other. What may interact with this medication? Alcohol Antiviral medications for HIV or AIDS Aspirin and aspirin-like medications Beta blockers, such as atenolol, metoprolol, propranolol Certain medications for blood pressure, heart disease, irregular heart beat Chromium Clonidine Diuretics Estrogen or progestin hormones Fenofibrate Gemfibrozil Guanethidine Isoniazid Lanreotide Female hormones or anabolic steroids MAOIs, such as Marplan, Nardil, and Parnate Medications for weight loss Medications for allergies, asthma, cold, or cough Medications for depression, anxiety, or mental health conditions Niacin Nicotine NSAIDs, medications for pain and  inflammation, such as ibuprofen or naproxen Octreotide Other medications for diabetes, such as glyburide, glipizide, or glimepiride Pasireotide Pentamidine Phenytoin Probenecid Quinolone antibiotics, such as ciprofloxacin, levofloxacin,  ofloxacin Reserpine Some herbal dietary supplements Steroid medications, such as prednisone or cortisone Sulfamethoxazole; trimethoprim Thyroid hormones Warfarin This list may not describe all possible interactions. Give your health care provider a list of all the medicines, herbs, non-prescription drugs, or dietary supplements you use. Also tell them if you smoke, drink alcohol, or use illegal drugs. Some items may interact with your medicine. What should I watch for while using this medication? Visit your care team for regular checks on your progress. You may need blood work done while you are taking this medication. Your care team will monitor your HbA1C (A1C). This test shows what your average blood sugar (glucose) level was over the past 2 to 3 months. Know the symptoms of low blood sugar and know how to treat it. Always carry a source of quick sugar with you. Examples include hard sugar candy or glucose tablets. Make sure others know that you can choke if you eat or drink if your blood sugar is too low and you are unable to care for yourself. Get medical help at once. Tell your care team if you have high blood sugar. Your medication dose may change if your body is under stress. Some types of stress that may affect your blood sugar include fever, infection, and surgery. Check with your care team if you have severe diarrhea, nausea, and vomiting, or if you sweat a lot. The loss of too much body fluid may make it dangerous for you to take this medication. Do not share pens or cartridges with anyone, even if the needle is changed. Each pen should only be used by one person. Sharing could cause an infection. Wear a medical ID bracelet or chain. Carry a card that describes your condition. List the medications and doses you take on the card. Talk to your care team about your risk of cancer. You may be more at risk for certain types of cancer if you take this medication. Estrogen and progestin  hormones may not work as well while you are taking this medication. If you take these as pills by mouth, your care team may recommend another type of contraception for 4 weeks after you start this medication and for 4 weeks after each dose increase. Talk to your care team about contraceptive options. They can help you find the option that works for you. What side effects may I notice from receiving this medication? Side effects that you should report to your care team as soon as possible: Allergic reactions or angioedema--skin rash, itching or hives, swelling of the face, eyes, lips, tongue, arms, or legs, trouble swallowing or breathing Change in vision Dehydration--increased thirst, dry mouth, feeling faint or lightheaded, headache, dark yellow or brown urine Gallbladder problems--severe stomach pain, nausea, vomiting, fever Kidney injury--decrease in the amount of urine, swelling of the ankles, hands, or feet Pancreatitis--severe stomach pain that spreads to your back or gets worse after eating or when touched, fever, nausea, vomiting Thoughts of suicide or self-harm, worsening mood, feelings of depression Thyroid cancer--new mass or lump in the neck, pain or trouble swallowing, trouble breathing, hoarseness Side effects that usually do not require medical attention (report these to your care team if they continue or are bothersome): Diarrhea Loss of appetite Nausea Upset stomach This list may not describe all possible side effects. Call your doctor for medical  advice about side effects. You may report side effects to FDA at 1-800-FDA-1088. Where should I keep my medication? Keep out of the reach of children and pets. Refrigeration (preferred): Store in the refrigerator. Keep this medication in the original carton until you are ready to take it. Do not freeze. Protect from light. Get rid of opened vials after use, even if there is medication left. Get rid of any unopened vials or pens after the  expiration date. Room Temperature: This medication may be stored at room temperature below 30 degrees C (86 degrees F) for up to 21 days. Keep this medication in the original carton until you are ready to take it. Protect from light. Avoid exposure to extreme heat. Get rid of opened vials after use, even if there is medication left. Get rid of any unopened vials or pens after 21 days, or after they expire, whichever is first. To get rid of medications that are no longer needed or have expired: Take the medication to a medication take-back program. Check with your pharmacy or law enforcement to find a location. If you cannot return the medication, ask your pharmacist or care team how to get rid of this medication safely. NOTE: This sheet is a summary. It may not cover all possible information. If you have questions about this medicine, talk to your doctor, pharmacist, or health care provider.  2024 Elsevier/Gold Standard (2023-03-15 00:00:00)

## 2023-07-12 NOTE — Progress Notes (Signed)
Office Visit Note  Patient: Holly Ortiz             Date of Birth: August 31, 1966           MRN: [id number]             PCP: Mliss Sax, MD Referring: Mliss Sax,* Visit Date: 07/26/2023   Subjective:  Follow-up (Patient states she was recently put on [facility] and it made her very sick and that is the cause of some of her symptoms today. )   History of Present Illness: Holly Ortiz is a 57 y.o. 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  After the last visit she tried resuming the duloxetine 30 mg daily but felt some intermittent increased anxiety or agitation with the medicine so stopped taking it and the symptoms improved.  At least 1 episode of increased joint swelling most prominent at her right hand and the third finger this was about 3 weeks ago and resolved over the course of about 1 week.  Some itching and rashes in a few areas most persistently on the right elbow with a patch of red itchy skin this is partially improved with topical emollients. Currently she feels very poorly was started on Mounjaro by her endocrinologist took the first dose 2 weeks ago.  Since she started this experience severe nausea loss of appetite and loose watery diarrhea with multiple small bowel movements after any solid oral intake.  Took the second dose 1 week ago and symptoms have been even worse often not even tolerating broth or other bland liquids.  Feels very drained and fatigued now.  Broke out in hives throughout her torso and worsening of skin rash on the arms for the past few days.  She says this is pretty typical reaction for her whenever she gets sick.  Previous HPI 05/03/2023 Holly Ortiz is a 57 y.o. 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  Her previously worsened widespread symptoms of fatigue did get somewhat better after our visit last  month feels the duloxetine was beneficial for joint pains also with her brain fog.  Her prescription ran out before this visit and so she came off of it and noticed some worsening in pain again.  But she suffered a fall 3 weeks ago down the stairs and hit the back of her head.  Initially did not think much of it subsequently has been feeling worsening headaches in the back of the head and pain in her neck area.  Also is noticing numbness radiating down to the fourth and fifth fingers in both hands.  She was seen at the ED and had a CT scan with no evidence of cervical spine injury and no hemorrhage.   Previous HPI 03/12/23 Holly Ortiz is a 57 y.o. female here for follow up for UCTD on hydroxychloroquine 400 mg daily symptoms currently in exacerbation for the past 3 weeks.  She is having increased pain in multiple areas neck shoulders arms as well as the low back and tailbone region.  She is feeling persistently fatigued every day also increased headaches.  She notices some more swelling in both hands then has been present for at least the past year.  She does describe very poor sleep during these few weeks and a very high level of work stress contributing.   Previous HPI 01/25/23 Holly Ortiz is a 57 y.o. female here for  follow up for UCTD with inflammatory arthritis and chronic urticaria on hydroxychloroquine 400 mg daily.  Past few months she reports worsening fatigue and concentration and short-term memory problems.  Also increased pain especially in the low back bilaterally.  Does not have pain or numbness radiation into the legs.  She is experienced some weakness and difficulty standing back up from the floor or crouched position.  Frequently wakes up with low back pain that improves after taking 2 regular Advil and moving around for a while.  Has not had severe outbreak of hives but keeps getting a small number of these around the neck and back of the scalp.  Follow-up with primary care office in  December still had poorly controlled diabetes with hemoglobin A1c 11.8.  Skin rash behind the right knee is unchanged has a new erythematous rash in the right elbow. She has been under increased stress with work changes after her [employer] branch angry and was close now working in [place] still getting used to the change in location and coworkers.   Previous HPI 07/31/22 Holly Ortiz is a 57 y.o. female here for follow up for UCTD with symptoms of inflammatory arthritis and chronic urticaria on HCQ 400 mg daily. She has generally been doing well without severe flare ups. She has followed up with her ophthalmologist but not remembered to specifically address her HCQ screening exam. Since last week she is having new left flank pain radiating around the side somewhat along bottom of ribs. She does not recall any preceding illness or injury. She does notice increased urinary frequency during this time. No dysuria or gross hematuria. She has a history of a kidney stone once about 30 years ago during pregnancy and last known UTI about 10 years ago. She has a new dog and has a lot of new scratches from this.   Previous HPI 01/31/2022 Holly Ortiz is a 57 y.o. female here for follow up for inflammatory arthritis and chronic urticaria on HCQ 400 mg daily. She had a significant flare up of symptoms around Thanksgiving that resolved after a couple weeks without specific intervention. She takes advil as needed when joint pain and swelling are worse and when really bad sometimes flexeril. She had another shorter episode of symptoms for about 4 days earlier this year. Most common joint swelling lately is at the 3rd knuckle of each hand, knee swelling has been less common. She did not have many episodes of hives outside of these flares. She is starting to have nasal congestion and hoarseness of voice she thinks is seasonal allergies returning.   Previous HPI 08/02/21 Rohnda Ortiz is a 57 y.o. female  here for follow up for inflammatory arthritis and chronic urticarial rashes on hydroxychloroquine 400 mg PO daily. Due to low back and knee pain that seemed noninflammatory also recommended trial of oral muscle relaxant as needed at night.  So far she feels a very good improvement in her overall symptoms.  She has not had any recurrence of skin rash since at least 2 months ago.  Joint pains are doing well.  She still has some of the fatigue and concentration difficulty though this is also partially better.   02/22/21 Keshon Lefler is a 57 y.o. female here for evaluation of positive ANA with chronic joint pains, fatigue, and intermittent hives for years. She describes symptoms starting since around 2007. She has had pain in joints and muscles at various areas to some extent most of the time. She also  developed a problem with recurrent urticaria rashes at multiple areas, most often after increased stress or other provocative events such as infection and antibiotic use. Since that time she has had workup with allergy and immunology clinics and dermatology clinic with no specific cause able to be found. A more recent symptom is change in sensation in her legs and feet. She notices swelling and discomfort after prolonged standing or prolonged sitting. She had a few episodes of becoming dizzy and unstable after standing and a few falls.   Labs reviewed 01/2021 ANA 1:80 homogenous, speckled CBC wnl TSH wnl CK wnl CRP wnl   Review of Systems  Constitutional:  Positive for fatigue.  HENT:  Positive for mouth dryness. Negative for mouth sores.   Eyes:  Positive for dryness.  Respiratory:  Negative for shortness of breath.   Cardiovascular:  Negative for chest pain and palpitations.  Gastrointestinal:  Positive for blood in stool and diarrhea. Negative for constipation.  Endocrine: Positive for increased urination.  Genitourinary:  Positive for involuntary urination.  Musculoskeletal:  Positive for  joint pain, gait problem, joint pain, myalgias, muscle weakness, morning stiffness, muscle tenderness and myalgias. Negative for joint swelling.  Skin:  Positive for rash, hair loss and sensitivity to sunlight. Negative for color change.  Allergic/Immunologic: Negative for susceptible to infections.  Neurological:  Positive for dizziness. Negative for headaches.  Hematological:  Negative for swollen glands.  Psychiatric/Behavioral:  Negative for depressed mood and sleep disturbance. The patient is nervous/anxious.     PMFS History:  Patient Active Problem List   Diagnosis Date Noted   Headache 05/03/2023   Other fatigue 01/25/2023   Rash and other nonspecific skin eruption 01/25/2023   Urinary frequency 08/02/2022   Left flank pain 07/31/2022   Pain in left knee 05/03/2021   Arthralgia 02/24/2021   Undifferentiated connective tissue disease (HCC) 02/22/2021   Iron deficiency 07/07/2020   Left low back pain 01/14/2020   Anemia 12/02/2019   Lightheadedness 12/02/2019   Menorrhagia 08/07/2019   Abnormal uterine bleeding 08/07/2019   Anxiety and depression 07/03/2018   DKA (diabetic ketoacidoses) 03/28/2018   Tachycardia 03/28/2018   Urticaria 03/28/2018   Hyperosmolar hyperglycemia 01/04/2018   AKI (acute kidney injury) (HCC) 01/04/2018   Hyperglycemia 01/03/2018   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-I)    Asthma    Type 2 diabetes mellitus with complication, with long-term current use of insulin (HCC)    Elevated cholesterol    Essential hypertension     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-I)    Anxiety    on meds   Asthma    childhood   Cataract 2015   bilateral   Constipation    takes Miralax daily   Depression    on meds   Diabetes mellitus    type II-on meds   Hyperlipidemia    on meds   Hypertension    on meds   Lupus (HCC) 2022   dx 02/2021   Seasonal allergies     Urticaria, chronic    UTI (lower urinary tract infection)     Family History  Problem Relation Age of Onset   Colon polyps Mother 63   Colon cancer Mother 62   Hypertension Mother    Diabetes Father    Hypertension Father    COPD Father        Cause of death   Hypertension Sister  Diabetes Sister    Cancer Sister        Brain glioblastoma   Diabetes Sister    Hypertension Brother    Hyperlipidemia Other    Heart disease Other    Esophageal cancer Neg Hx    Stomach cancer Neg Hx    Rectal cancer Neg Hx    Past Surgical History:  Procedure Laterality Date   ABDOMINAL HYSTERECTOMY  08/07/2019   BREAST ENHANCEMENT SURGERY  1993   breast lift/reduction   CATARACT EXTRACTION, BILATERAL Bilateral 2014   CATARACT EXTRACTION, BILATERAL Bilateral 04/2022   COLONOSCOPY  2014   in [place]   LAPAROSCOPIC VAGINAL HYSTERECTOMY WITH SALPINGO OOPHORECTOMY Bilateral 08/07/2019   Procedure: LAPAROSCOPIC ASSISTED VAGINAL HYSTERECTOMY WITH SALPINGO OOPHORECTOMY, possible abdominal hysterectomy;  Surgeon: Harold Hedge, MD;  Location: [facility] OR;  Service: Gynecology;  Laterality: Bilateral;  possible abdominal hysterectomy pt is diabetic and hypertensive   SOFT TISSUE MASS EXCISION Right 2016   [place]   TUBAL LIGATION  1995   tummy tuck  1993   Social History   Social History Narrative   Not on file   Immunization History  Administered Date(s) Administered   DTaP 07/04/2009   Influenza,inj,Quad PF,6+ Mos 01/05/2018, 08/28/2018, 09/05/2019, 10/05/2020   PFIZER(Purple Top)SARS-COV-2 Vaccination 02/03/2020, 03/02/2020   Pneumococcal Polysaccharide-23 01/05/2018   Tdap 12/04/2012   Zoster Recombinant(Shingrix) 06/29/2021     Objective: Vital Signs: BP 107/69 (BP Location: Left Arm, Patient Position: Sitting, Cuff Size: Normal)   Pulse 97   Resp 12   Ht 5\' 3"  (1.6 m)   Wt 185 lb (83.9 kg)   LMP 05/13/2019 Comment: patient had not had her period in a year and it came back in May  and June 2020  BMI 32.77 kg/m    Physical Exam Constitutional:      Appearance: She is obese.  HENT:     Mouth/Throat:     Mouth: Mucous membranes are dry.     Pharynx: Oropharynx is clear.  Cardiovascular:     Rate and Rhythm: Normal rate and regular rhythm.  Pulmonary:     Effort: Pulmonary effort is normal.     Breath sounds: Normal breath sounds.  Skin:    General: Skin is warm and dry.     Findings: Rash present.  Neurological:     Mental Status: She is alert.  Psychiatric:        Mood and Affect: Mood normal.        Musculoskeletal Exam:  Neck full ROM no tenderness Shoulders full ROM no tenderness or swelling Elbows full ROM no tenderness or swelling Wrists full ROM no tenderness or swelling Fingers full ROM no tenderness or swelling Knees full ROM no tenderness or swelling   Investigation: No additional findings.  Imaging: No results found.  Recent Labs: Lab Results  Component Value Date   WBC 6.8 04/24/2023   HGB 12.6 04/24/2023   PLT 336.0 04/24/2023   NA 142 04/24/2023   K 4.0 04/24/2023   CL 102 04/24/2023   CO2 30 04/24/2023   GLUCOSE 218 (H) 04/24/2023   BUN 11 04/24/2023   CREATININE 0.57 04/24/2023   BILITOT 0.4 04/24/2023   ALKPHOS 73 04/24/2023   AST 10 04/24/2023   ALT 15 04/24/2023   PROT 7.0 04/24/2023   ALBUMIN 4.0 04/24/2023   CALCIUM 9.6 04/24/2023   GFRAA >60 07/30/2019    Speciality Comments: PLQ Eye Exam: 10/12/2022 WNL @ [facility]  Procedures:  No procedures  performed Allergies: Ozempic (0.25 or 0.5 mg-dose) [semaglutide(0.25 or 0.5mg -dos)], Actos [pioglitazone hydrochloride], Lisinopril, and Metformin and related   Assessment / Plan:     Visit Diagnoses: Undifferentiated connective tissue disease (HCC) - Plan: Sedimentation rate, Anti-DNA antibody, double-stranded, C3 and C4  I think her autoimmune disease activity has been fine.  Current urticaria rashes without any high risk features for  vasculitis.  We will recheck the sed rate double-stranded DNA and serum complements for monitoring.  Plan to continue on hydroxychloroquine 400 mg daily.  High risk medication use - hydroxychloroquine 400 mg daily. PLQ Eye Exam: 10/12/2022 WNL  Checking CBC and CMP for medication monitoring continue long-term use of hydroxychloroquine.  Most recent eye exam from November was normal with 1 year follow-up recommended.  No serious interval infections.  Anxiety and depression  Trial of resuming duloxetine was not well-tolerated.  Currently not in major exacerbation.  Medication side effect - Plan: CBC with Differential/Platelet, COMPLETE METABOLIC PANEL WITH GFR  Having pretty severe GI intolerance after the [facility] start with decreased oral intake and loose watery frequent stools.  Checking her metabolic panel today make sure there is no dangerous electrolyte derangement or acute kidney injury.  Otherwise no particular treatment available for drug washout suggest abortive care for now.  Encourage intake of fluid and electrolyte such as Gatorade or Pedialyte.  Orders: Orders Placed This Encounter  Procedures   CBC with Differential/Platelet   COMPLETE METABOLIC PANEL WITH GFR   Sedimentation rate   Anti-DNA antibody, double-stranded   C3 and C4   No orders of the defined types were placed in this encounter.    Follow-Up Instructions: Return in about 6 months (around 01/26/2024) for SLE/UCTD on HCQ f/u 6mos.   Fuller Plan, MD  Note - This record has been created using [employer].  Chart creation errors have been sought, but may not always  have been located. Such creation errors do not reflect on  the standard of medical care.

## 2023-07-12 NOTE — Progress Notes (Signed)
Outpatient Endocrinology Note [name], MD  07/12/23  Patient Name: Holly Ortiz   DOB : 1966/04/30 MRN # [id number]                                                    REASON OF VISIT: Follow up of type 2 diabetes mellitus  PCP: Mliss Sax, MD  HISTORY OF PRESENT ILLNESS:   Holly Ortiz is a 57 y.o. old female with past medical history listed below, is here for follow up of type 2 diabetes mellitus.  Patient was last seen by Dr. Lucianne Muss in August 2022.  Pertinent Diabetes History: _Diagnosed as type 2 at the age of 83 years.  She was initially treated with oral antidiabetic medications including metformin.  Insulin therapy was started around 2015 timeframe.  Metformin was stopped due to GI intolerance.  Prior medications include Amaryl and Januvia and Ozempic.  Ozempic was stopped in the past due to nausea and vomiting.  As per the office note from July 14, 2021 by Dr. Lucianne Muss her insulin regimen includes Humulin U-500 : 50 units with breakfast, 60 units with lunch, 45 to 55 units with supper and 55 units at bedtime, along with Farxiga 10 mg daily.  Chronic Diabetes Complications : Retinopathy: unknown. Last ophthalmology exam was done on Due Nephropathy: no Peripheral neuropathy: no Coronary artery disease: no Stroke: no  Relevant comorbidities and cardiovascular risk factors: Obesity: yes Body mass index is 34.58 kg/m.  Hypertension: yes Hyperlipidemia. Yes, on a statin.  Current / Home Diabetic regimen includes: Farxiga 10 mg daily.  She has not been taking your insulin/U-500 insulin for about 3 months.  Prior diabetic medications: Ozempic is stopped due to nausea and vomiting. Metformin is stopped due to GI intolerance. She used to be on Amaryl and Januvia in the past.  Glycemic data:   No glucometer to review in the clinic.  She has been occasionally checking blood sugar at home and reports has been 300-400 range. She does not like to use  continuous glucose monitoring. She has [employer] brand of glucometer and not able to download in the past in the clinic.  Hypoglycemia: Patient has no hypoglycemic episodes. Patient has hypoglycemia awareness.  Factors modifying glucose control: 1.  Diabetic diet assessment: Typically eats large breakfast and dinner and smaller portion of lunch.  Occasionally eats bedtime snack usually yogurts. Food includes rice / pasta etc.  2.  Staying active or exercising: No formal exercise.  3.  Medication compliance: compliant some of the time.  Interval history 07/12/23 Patient was last seen in this clinic in August 2022 by Dr. Lucianne Muss.  She lost follow-up.  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  She ran out of insulin U-500 and has not been taking for about 3 months.  She is only taking Farxiga 10 mg daily.  She denies complaints of vision problem.  No numbness and tingling of the feet.  When she was taking U-500 she does not recall the exact dose.  She has not been regularly monitoring her blood sugar at home.  Hemoglobin A1c 14.5% today.  Recent lipid lab in May acceptable.  REVIEW OF SYSTEMS As per history of present  illness.   PAST MEDICAL HISTORY: 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-I)    Anxiety    on meds   Asthma    childhood   Cataract 2015   bilateral   Constipation    takes Miralax daily   Depression    on meds   Diabetes mellitus    type II-on meds   Hyperlipidemia    on meds   Hypertension    on meds   Lupus (HCC) 2022   dx 02/2021   Seasonal allergies    Urticaria, chronic    UTI (lower urinary tract infection)     PAST SURGICAL HISTORY: Past Surgical History:  Procedure Laterality Date   ABDOMINAL HYSTERECTOMY  08/07/2019   BREAST ENHANCEMENT SURGERY  1993    breast lift/reduction   CATARACT EXTRACTION, BILATERAL Bilateral 2014   CATARACT EXTRACTION, BILATERAL Bilateral 04/2022   COLONOSCOPY  2014   in [place]   LAPAROSCOPIC VAGINAL HYSTERECTOMY WITH SALPINGO OOPHORECTOMY Bilateral 08/07/2019   Procedure: LAPAROSCOPIC ASSISTED VAGINAL HYSTERECTOMY WITH SALPINGO OOPHORECTOMY, possible abdominal hysterectomy;  Surgeon: Harold Hedge, MD;  Location: [facility] OR;  Service: Gynecology;  Laterality: Bilateral;  possible abdominal hysterectomy pt is diabetic and hypertensive   SOFT TISSUE MASS EXCISION Right 2016   [place]   TUBAL LIGATION  1995   tummy tuck  1993    ALLERGIES: Allergies  Allergen Reactions   Ozempic (0.25 Or 0.5 Mg-Dose) [Semaglutide(0.25 Or 0.5mg -Dos)] Nausea And Vomiting   Actos [Pioglitazone Hydrochloride] Other (See Comments)    Wt gain   Lisinopril     Angioedema, including likely GI involvement   Metformin And Related Diarrhea and Nausea And Vomiting    FAMILY HISTORY:  Family History  Problem Relation Age of Onset   Colon polyps Mother 97   Colon cancer Mother 27   Hypertension Mother    Diabetes Father    Hypertension Father    COPD Father        Cause of death   Hypertension Sister    Diabetes Sister    Cancer Sister        Brain glioblastoma   Diabetes Sister    Hypertension Brother    Hyperlipidemia Other    Heart disease Other    Esophageal cancer Neg Hx    Stomach cancer Neg Hx    Rectal cancer Neg Hx     SOCIAL HISTORY: Social History   Socioeconomic History   Marital status: Married    Spouse name: Kelby Fam   Number of children: 4   Years of education: 13--1.5 years of college   Highest education level: 12th grade  Occupational History   Occupation: Photographer  Tobacco Use   Smoking status: Never    Passive exposure: Never   Smokeless tobacco: Never  Vaping Use   Vaping status: Never Used  Substance and Sexual Activity   Alcohol use: No   Drug use: No   Sexual activity: Yes    Birth  control/protection: Surgical    Comment: BTL  Other Topics Concern   Not on file  Social History Narrative   Not on file   Social Determinants of Health   Financial Resource Strain: Not on file  Food Insecurity: Food Insecurity Present (12/18/2017)   Hunger Vital Sign    Worried About Running Out of Food in the Last Year: Sometimes true    Ran Out of Food in the Last Year: Never true  Transportation Needs: No Transportation Needs (12/18/2017)   PRAPARE - Administrator, Civil Service (Medical): No    Lack of Transportation (Non-Medical): No  Physical Activity: Not on file  Stress: Stress Concern Present (12/18/2017)   [employer] of Occupational Health - Occupational Stress Questionnaire    Feeling of Stress : Very much  Social Connections: Not on file    MEDICATIONS:  Current Outpatient Medications  Medication Sig Dispense Refill   acetaminophen (TYLENOL) 500 MG tablet Take 2 tablets (1,000 mg total) by mouth every 6 (six) hours as needed. 60 tablet 0   amLODipine (NORVASC) 10 MG tablet Take 1 tablet (10 mg total) by mouth daily. 90 tablet 3   atorvastatin (LIPITOR) 40 MG tablet Take 1 tablet (40 mg total) by mouth daily. 90 tablet 2   Cholecalciferol (VITAMIN D) 50 MCG (2000 UT) CAPS Take 2,000 Units by mouth daily.      cyclobenzaprine (FLEXERIL) 5 MG tablet TAKE 1 TABLET BY MOUTH AT BEDTIME AS NEEDED FOR MUSCLE SPASMS. 30 tablet 1   DULoxetine (CYMBALTA) 30 MG capsule Take 1 capsule (30 mg total) by mouth daily. 90 capsule 0   escitalopram (LEXAPRO) 20 MG tablet Take 1 tablet (20 mg total) by mouth daily. 90 tablet 3   fenofibrate (TRICOR) 145 MG tablet Take 1 tablet (145 mg total) by mouth daily. 90 tablet 1   hydroxychloroquine (PLAQUENIL) 200 MG tablet Take 2 tablets (400 mg total) by mouth daily. 180 tablet 1   Insulin Pen Needle (BD PEN NEEDLE NANO 2ND GEN) 32G X 4 MM MISC Use four times daily to inject insulin. 200 each 11   Melatonin 5 MG TABS Take 5 mg  by mouth at bedtime as needed.     MIEBO 1.338 GM/ML SOLN Apply 1 drop to eye 4 (four) times daily.     montelukast (SINGULAIR) 10 MG tablet TAKE 1 TABLET BY MOUTH EVERYDAY AT BEDTIME 90 tablet 1   polyethylene glycol (MIRALAX / GLYCOLAX) 17 g packet Take 17 g by mouth daily.     tirzepatide [facility]) 2.5 MG/0.5ML Pen Inject 2.5 mg into the skin once a week. 2 mL 0   tirzepatide (MOUNJARO) 5 MG/0.5ML Pen Inject 5 mg into the skin once a week. After completion of 2.5 mg dose for 4 weeks. 6 mL 3   triamcinolone ointment (KENALOG) 0.5 % Apply 1 Application topically 2 (two) times daily as needed. 30 g 0   triamterene-hydrochlorothiazide (MAXZIDE-25) 37.5-25 MG tablet Take 1 tablet by mouth daily. 90 tablet 1   vitamin B-12 (CYANOCOBALAMIN) 1000 MCG tablet Take 1,000 mcg by mouth daily.     zinc gluconate 50 MG tablet Take 50 mg by mouth daily.      dapagliflozin propanediol (FARXIGA) 10 MG TABS tablet Take 1 tablet (10 mg total) by mouth daily. 90 tablet 3   insulin regular human CONCENTRATED (HUMULIN R U-500 KWIKPEN) 500 UNIT/ML KwikPen INJECT 40 UNITS UNDER THE SKIN BEFORE BREAKFAST, 20-25 UNITS BEFORE LUNCH AND 40 UNITS AT SUPPER. 18 mL 6   Current Facility-Administered Medications  Medication Dose Route Frequency Provider Last Rate Last Admin   0.9 %  sodium chloride infusion  500 mL Intravenous Once Jenel Lucks, MD        PHYSICAL EXAM: Vitals:   07/12/23 0811  BP: 128/70  Pulse: (!) 109  SpO2: 98%  Weight: 195 lb 3.2 oz (88.5 kg)  Height: 5\' 3"  (1.6 m)   Body mass index is 34.58  kg/m.  Wt Readings from Last 3 Encounters:  07/12/23 195 lb 3.2 oz (88.5 kg)  05/03/23 202 lb (91.6 kg)  04/24/23 200 lb 12.8 oz (91.1 kg)    General: Well developed, well nourished female in no apparent distress.  HEENT: AT/[place], no external lesions.  Eyes: Conjunctiva clear and no icterus. Neck: Neck supple  Lungs: Respirations not labored Neurologic: Alert, oriented, normal  speech Extremities / Skin: Dry. No sores or rashes noted. No acanthosis nigricans Psychiatric: Does not appear depressed or anxious  Diabetic Foot Exam - Simple   No data filed   LABS Reviewed Lab Results  Component Value Date   HGBA1C 14.5 (A) 07/12/2023   HGBA1C 11.8 (H) 10/02/2022   HGBA1C 11.5 (H) 03/28/2022   Lab Results  Component Value Date   FRUCTOSAMINE 307 (H) 04/14/2021   FRUCTOSAMINE 314 (H) 09/30/2020   FRUCTOSAMINE 408 (H) 04/09/2020   Lab Results  Component Value Date   CHOL 172 04/24/2023   HDL 42.10 04/24/2023   LDLCALC 85 07/11/2021   LDLDIRECT 102.0 04/24/2023   TRIG 250.0 (H) 04/24/2023   CHOLHDL 4 04/24/2023   Lab Results  Component Value Date   MICRALBCREAT 3.8 10/02/2022   MICRALBCREAT 2.3 02/03/2021   Lab Results  Component Value Date   CREATININE 0.57 04/24/2023   Lab Results  Component Value Date   GFR 101.68 04/24/2023    ASSESSMENT / PLAN  1. Uncontrolled type 2 diabetes mellitus with hyperglycemia, with long-term current use of insulin (HCC)     Diabetes Mellitus type 2 poorly controlled. - Diabetic status / severity: Worsening.  Lab Results  Component Value Date   HGBA1C 14.5 (A) 07/12/2023    - Hemoglobin A1c goal : <7%  Discussed about diabetes mellitus and potential chronic complications.  Discussed about importance of controlling blood sugar to prevent chronic complications.  Discussed about compliance with diabetic medication including insulin.  - Medications: Will resume insulin regimen and adjusted diabetes regimen as follows.  Start Humulin U500 : 40 units with breakfast, 20 to 25 units with lunch and 40 units with supper. Continue Farxiga 10mg  daily. Start Mounjaro 2.5mg  weekly for 4 weeks, then increase to 5mg  weekly.  She had nausea and vomiting with Ozempic in the past.  Discussed that is likely to happen nausea and vomiting with Mounjaro as well in the beginning however should resolve over time.  Patient  agreed to try [facility].  Get branded glucometer and monitor glucose before meals and at bedtime and bring in office visit to download and review.   Asked to call our clinic to review glucose data and probably able to adjust diabetes regimen in between the visits.  - Home glucose testing: Before meals and bedtime 4 times a day.  Offered continuous glucose monitoring, she does not want to use CGM. - Discussed/ Gave Hypoglycemia treatment plan.  # Consult : not required at this time.   # Annual urine for microalbuminuria/ creatinine ratio, no microalbuminuria currently, continue ACE/ARB , not on ACE/ARB at this time. Last  Lab Results  Component Value Date   MICRALBCREAT 3.8 10/02/2022    # Foot check nightly.  # Annual dilated diabetic eye exams.  Advised to have diabetic eye exam.  - Diet: Make healthy diabetic food choices - Life style / activity / exercise: Discussed about regular exercise.  2. Blood pressure  -  BP Readings from Last 1 Encounters:  07/12/23 128/70    - Control is in target.  - No  change in current plans.  3. Lipid status / Hyperlipidemia - Last  Lab Results  Component Value Date   LDLCALC 85 07/11/2021   - Continue atorvastatin 40 mg daily.  Diagnoses and all orders for this visit:  Uncontrolled type 2 diabetes mellitus with hyperglycemia, with long-term current use of insulin (HCC) -     POCT glycosylated hemoglobin (Hb A1C) -     POCT glucose (manual entry) -     insulin regular human CONCENTRATED (HUMULIN R U-500 KWIKPEN) 500 UNIT/ML KwikPen; INJECT 40 UNITS UNDER THE SKIN BEFORE BREAKFAST, 20-25 UNITS BEFORE LUNCH AND 40 UNITS AT SUPPER.  Other orders -     tirzepatide [facility]) 2.5 MG/0.5ML Pen; Inject 2.5 mg into the skin once a week. -     tirzepatide [facility]) 5 MG/0.5ML Pen; Inject 5 mg into the skin once a week. After completion of 2.5 mg dose for 4 weeks. -     dapagliflozin propanediol (FARXIGA) 10 MG TABS tablet; Take 1 tablet (10  mg total) by mouth daily.   DISPOSITION Follow up in clinic in 3  months suggested.   All questions answered and patient verbalized understanding of the plan.  [place] Brennin Durfee, MD [facility] Endocrinology [facility] [address] [place], [address] [place], [place] [address] Phone # [phone]  At least part of this note was generated using voice recognition software. Inadvertent word errors may have occurred, which were not recognized during the proofreading process.

## 2023-07-23 ENCOUNTER — Telehealth: Payer: Self-pay | Admitting: Endocrinology

## 2023-07-23 NOTE — Telephone Encounter (Signed)
Patient advised and verbalized understanding 

## 2023-07-23 NOTE — Telephone Encounter (Signed)
Dr Erroll Luna is here, I believe. Will route to him.

## 2023-07-23 NOTE — Telephone Encounter (Signed)
Patient states that has taking 2 doses of Mounjaro and have experience diarrhea, vomiting,and fatigue. Would you like to change to another medication.

## 2023-07-23 NOTE — Telephone Encounter (Signed)
Patient advising  Holly Ortiz is making her sick and she is wanting to speak to clinical staff to tell her what to do. Please advise

## 2023-07-26 ENCOUNTER — Ambulatory Visit: Payer: 59 | Attending: Internal Medicine | Admitting: Internal Medicine

## 2023-07-26 ENCOUNTER — Encounter: Payer: Self-pay | Admitting: Internal Medicine

## 2023-07-26 VITALS — BP 107/69 | HR 97 | Resp 12 | Ht 63.0 in | Wt 185.0 lb

## 2023-07-26 DIAGNOSIS — Z79899 Other long term (current) drug therapy: Secondary | ICD-10-CM | POA: Diagnosis not present

## 2023-07-26 DIAGNOSIS — R519 Headache, unspecified: Secondary | ICD-10-CM | POA: Diagnosis not present

## 2023-07-26 DIAGNOSIS — M545 Low back pain, unspecified: Secondary | ICD-10-CM

## 2023-07-26 DIAGNOSIS — F419 Anxiety disorder, unspecified: Secondary | ICD-10-CM

## 2023-07-26 DIAGNOSIS — M359 Systemic involvement of connective tissue, unspecified: Secondary | ICD-10-CM

## 2023-07-26 DIAGNOSIS — F32A Depression, unspecified: Secondary | ICD-10-CM

## 2023-07-26 DIAGNOSIS — G8929 Other chronic pain: Secondary | ICD-10-CM

## 2023-07-26 DIAGNOSIS — T887XXA Unspecified adverse effect of drug or medicament, initial encounter: Secondary | ICD-10-CM

## 2023-07-27 LAB — CBC WITH DIFFERENTIAL/PLATELET
Absolute Monocytes: 662 {cells}/uL (ref 200–950)
Basophils Absolute: 9 {cells}/uL (ref 0–200)
Basophils Relative: 0.1 %
Eosinophils Absolute: 232 {cells}/uL (ref 15–500)
Eosinophils Relative: 2.7 %
HCT: 44.9 % (ref 35.0–45.0)
Hemoglobin: 14.1 g/dL (ref 11.7–15.5)
Lymphs Abs: 1428 {cells}/uL (ref 850–3900)
MCH: 26.1 pg — ABNORMAL LOW (ref 27.0–33.0)
MCHC: 31.4 g/dL — ABNORMAL LOW (ref 32.0–36.0)
MCV: 83.1 fL (ref 80.0–100.0)
MPV: 9.7 fL (ref 7.5–12.5)
Monocytes Relative: 7.7 %
Neutro Abs: 6269 cells/uL (ref 1500–7800)
Neutrophils Relative %: 72.9 %
Platelets: 338 10*3/uL (ref 140–400)
RBC: 5.4 10*6/uL — ABNORMAL HIGH (ref 3.80–5.10)
RDW: 13.4 % (ref 11.0–15.0)
Total Lymphocyte: 16.6 %
WBC: 8.6 10*3/uL (ref 3.8–10.8)

## 2023-07-27 LAB — COMPLETE METABOLIC PANEL WITH GFR
AG Ratio: 1.4 (calc) (ref 1.0–2.5)
ALT: 16 U/L (ref 6–29)
AST: 8 U/L — ABNORMAL LOW (ref 10–35)
Albumin: 4.2 g/dL (ref 3.6–5.1)
Alkaline phosphatase (APISO): 76 U/L (ref 37–153)
BUN/Creatinine Ratio: 31 (calc) — ABNORMAL HIGH (ref 6–22)
BUN: 37 mg/dL — ABNORMAL HIGH (ref 7–25)
CO2: 21 mmol/L (ref 20–32)
Calcium: 9.6 mg/dL (ref 8.6–10.4)
Chloride: 102 mmol/L (ref 98–110)
Creat: 1.19 mg/dL — ABNORMAL HIGH (ref 0.50–1.03)
Globulin: 3.1 g/dL (ref 1.9–3.7)
Glucose, Bld: 442 mg/dL — ABNORMAL HIGH (ref 65–99)
Potassium: 3.5 mmol/L (ref 3.5–5.3)
Sodium: 134 mmol/L — ABNORMAL LOW (ref 135–146)
Total Bilirubin: 0.3 mg/dL (ref 0.2–1.2)
Total Protein: 7.3 g/dL (ref 6.1–8.1)
eGFR: 54 mL/min/{1.73_m2} — ABNORMAL LOW (ref >=60)

## 2023-07-27 LAB — C3 AND C4
C3 Complement: 175 mg/dL (ref 83–193)
C4 Complement: 52 mg/dL (ref 15–57)

## 2023-07-27 LAB — SEDIMENTATION RATE: Sed Rate: 34 mm/h — ABNORMAL HIGH (ref 0–30)

## 2023-07-27 LAB — ANTI-DNA ANTIBODY, DOUBLE-STRANDED: ds DNA Ab: 24 [IU]/mL — ABNORMAL HIGH

## 2023-07-27 NOTE — Progress Notes (Signed)
Her glucose was very high at 442. Kidney function is worse than normal with creatinine of 1.19 up from 0.57.  There is no problem with her electrolytes.  She should try to increase fluid intake as much as possible to improve this. If symptoms are not clearing up by early next week she might nee to see urgent care or the ED. Otherwise we can recheck the metabolic panel in 1 week to see that her kidney function recovers to normal.  Blood count is normal. Sedimentation rate is 34 this is above normal but better than hers has been previously.

## 2023-07-31 ENCOUNTER — Other Ambulatory Visit: Payer: Self-pay | Admitting: *Deleted

## 2023-07-31 DIAGNOSIS — R21 Rash and other nonspecific skin eruption: Secondary | ICD-10-CM

## 2023-07-31 DIAGNOSIS — R350 Frequency of micturition: Secondary | ICD-10-CM

## 2023-07-31 DIAGNOSIS — Z79899 Other long term (current) drug therapy: Secondary | ICD-10-CM

## 2023-07-31 DIAGNOSIS — T887XXA Unspecified adverse effect of drug or medicament, initial encounter: Secondary | ICD-10-CM

## 2023-07-31 DIAGNOSIS — M359 Systemic involvement of connective tissue, unspecified: Secondary | ICD-10-CM

## 2023-07-31 DIAGNOSIS — L509 Urticaria, unspecified: Secondary | ICD-10-CM

## 2023-07-31 DIAGNOSIS — R768 Other specified abnormal immunological findings in serum: Secondary | ICD-10-CM

## 2023-07-31 DIAGNOSIS — M2550 Pain in unspecified joint: Secondary | ICD-10-CM

## 2023-08-07 ENCOUNTER — Other Ambulatory Visit: Payer: Self-pay | Admitting: Internal Medicine

## 2023-08-07 DIAGNOSIS — R21 Rash and other nonspecific skin eruption: Secondary | ICD-10-CM

## 2023-08-07 MED ORDER — TRIAMCINOLONE ACETONIDE 0.5 % EX OINT
1.0000 | TOPICAL_OINTMENT | Freq: Two times a day (BID) | CUTANEOUS | 1 refills | Status: DC | PRN
Start: 2023-08-07 — End: 2024-05-01

## 2023-08-07 NOTE — Telephone Encounter (Signed)
Last Fill: 01/25/2023  Next Visit: 01/28/2024  Last Visit: 07/26/2023  Dx: Undifferentiated connective tissue disease   Current Dose per office note on 07/26/2023: not mentioned   Okay to refill Triamcinolone Cream?

## 2023-08-27 ENCOUNTER — Other Ambulatory Visit: Payer: Self-pay | Admitting: *Deleted

## 2023-08-27 DIAGNOSIS — M2550 Pain in unspecified joint: Secondary | ICD-10-CM

## 2023-08-27 DIAGNOSIS — Z79899 Other long term (current) drug therapy: Secondary | ICD-10-CM

## 2023-08-27 DIAGNOSIS — T887XXA Unspecified adverse effect of drug or medicament, initial encounter: Secondary | ICD-10-CM

## 2023-08-27 DIAGNOSIS — R768 Other specified abnormal immunological findings in serum: Secondary | ICD-10-CM

## 2023-08-27 DIAGNOSIS — M359 Systemic involvement of connective tissue, unspecified: Secondary | ICD-10-CM

## 2023-08-27 DIAGNOSIS — L509 Urticaria, unspecified: Secondary | ICD-10-CM

## 2023-08-27 DIAGNOSIS — R350 Frequency of micturition: Secondary | ICD-10-CM

## 2023-08-27 DIAGNOSIS — R21 Rash and other nonspecific skin eruption: Secondary | ICD-10-CM

## 2023-08-27 LAB — CBC WITH DIFFERENTIAL/PLATELET
Absolute Monocytes: 584 cells/uL (ref 200–950)
Basophils Absolute: 51 cells/uL (ref 0–200)
Basophils Relative: 0.7 %
Eosinophils Absolute: 380 cells/uL (ref 15–500)
Eosinophils Relative: 5.2 %
HCT: 40.1 % (ref 35.0–45.0)
Hemoglobin: 12.1 g/dL (ref 11.7–15.5)
Lymphs Abs: 1511 cells/uL (ref 850–3900)
MCH: 25.7 pg — ABNORMAL LOW (ref 27.0–33.0)
MCHC: 30.2 g/dL — ABNORMAL LOW (ref 32.0–36.0)
MCV: 85.3 fL (ref 80.0–100.0)
MPV: 9.4 fL (ref 7.5–12.5)
Monocytes Relative: 8 %
Neutro Abs: 4774 cells/uL (ref 1500–7800)
Neutrophils Relative %: 65.4 %
Platelets: 345 10*3/uL (ref 140–400)
RBC: 4.7 10*6/uL (ref 3.80–5.10)
RDW: 13.5 % (ref 11.0–15.0)
Total Lymphocyte: 20.7 %
WBC: 7.3 10*3/uL (ref 3.8–10.8)

## 2023-08-28 ENCOUNTER — Other Ambulatory Visit: Payer: Self-pay | Admitting: *Deleted

## 2023-08-28 DIAGNOSIS — Z79899 Other long term (current) drug therapy: Secondary | ICD-10-CM

## 2023-08-28 DIAGNOSIS — M2550 Pain in unspecified joint: Secondary | ICD-10-CM

## 2023-08-28 DIAGNOSIS — L509 Urticaria, unspecified: Secondary | ICD-10-CM

## 2023-08-28 DIAGNOSIS — T887XXA Unspecified adverse effect of drug or medicament, initial encounter: Secondary | ICD-10-CM

## 2023-08-28 DIAGNOSIS — M359 Systemic involvement of connective tissue, unspecified: Secondary | ICD-10-CM

## 2023-08-28 DIAGNOSIS — R768 Other specified abnormal immunological findings in serum: Secondary | ICD-10-CM

## 2023-08-28 NOTE — Progress Notes (Signed)
CBC from yesterday is normal. She had an abnormal increase in creatinine/decreased GFR on lab from 8/22 so recommended we recheck this again. I do not see that we got a new BMP with GFR on her though. Please check if Lupita Leash would be able to add this from what she drew? Otherwise we will need to ask Ms. Lindstrom about getting it checked.

## 2023-09-02 ENCOUNTER — Other Ambulatory Visit: Payer: Self-pay | Admitting: Internal Medicine

## 2023-09-02 DIAGNOSIS — G8929 Other chronic pain: Secondary | ICD-10-CM

## 2023-09-03 NOTE — Telephone Encounter (Signed)
Last Fill: 07/10/2023  Next Visit: 01/28/2024  Last Visit: 07/26/2023  Dx: not mentioned  Current Dose per office note on 07/26/2023: not mentioned  Okay to refill Flexeril?

## 2023-09-11 ENCOUNTER — Other Ambulatory Visit: Payer: Self-pay | Admitting: *Deleted

## 2023-09-11 DIAGNOSIS — Z79899 Other long term (current) drug therapy: Secondary | ICD-10-CM

## 2023-09-11 DIAGNOSIS — M2550 Pain in unspecified joint: Secondary | ICD-10-CM

## 2023-09-11 DIAGNOSIS — T887XXA Unspecified adverse effect of drug or medicament, initial encounter: Secondary | ICD-10-CM

## 2023-09-11 DIAGNOSIS — R768 Other specified abnormal immunological findings in serum: Secondary | ICD-10-CM

## 2023-09-11 DIAGNOSIS — L509 Urticaria, unspecified: Secondary | ICD-10-CM

## 2023-09-11 DIAGNOSIS — M359 Systemic involvement of connective tissue, unspecified: Secondary | ICD-10-CM

## 2023-09-12 LAB — BASIC METABOLIC PANEL WITH GFR
BUN: 13 mg/dL (ref 7–25)
CO2: 31 mmol/L (ref 20–32)
Calcium: 9.8 mg/dL (ref 8.6–10.4)
Chloride: 98 mmol/L (ref 98–110)
Creat: 0.58 mg/dL (ref 0.50–1.03)
Glucose, Bld: 444 mg/dL — ABNORMAL HIGH (ref 65–99)
Potassium: 4.9 mmol/L (ref 3.5–5.3)
Sodium: 138 mmol/L (ref 135–146)
eGFR: 106 mL/min/{1.73_m2} (ref >=60)

## 2023-09-12 NOTE — Progress Notes (Signed)
Blood sugar remains very high at 444.  Her kidney function never completely returned to normal though with estimated GFR of 106 that is great.  The previous decrease was probably related to dehydration.

## 2023-10-16 ENCOUNTER — Other Ambulatory Visit: Payer: Self-pay | Admitting: Family Medicine

## 2023-10-16 ENCOUNTER — Other Ambulatory Visit: Payer: Self-pay | Admitting: Internal Medicine

## 2023-10-16 DIAGNOSIS — L509 Urticaria, unspecified: Secondary | ICD-10-CM

## 2023-10-16 DIAGNOSIS — I10 Essential (primary) hypertension: Secondary | ICD-10-CM

## 2023-10-16 DIAGNOSIS — M359 Systemic involvement of connective tissue, unspecified: Secondary | ICD-10-CM

## 2023-10-16 NOTE — Telephone Encounter (Signed)
Last Fill: 01/25/2023  Eye exam: 10/12/2022 WNL   Labs: 09/11/2023 BMP Glucose 444  08/27/2023 CBC MCH 25.7 MCHC 30.2  Next Visit: 01/28/2024  Last Visit: 07/26/2023  [id number] connective tissue disease   Current Dose per office note 07/26/2023: hydroxychloroquine 400 mg daily  Attempted to contact the patient and left a message to contact the office back regarding her PLQ eye exam.   Okay to refill Plaquenil?

## 2023-10-25 ENCOUNTER — Ambulatory Visit: Payer: 59 | Admitting: Family Medicine

## 2023-10-25 ENCOUNTER — Telehealth: Payer: Self-pay | Admitting: Family Medicine

## 2023-10-25 NOTE — Telephone Encounter (Signed)
arrived 18 min late, 2nd missed visit, spouse is pt too

## 2023-10-25 NOTE — Telephone Encounter (Signed)
11.21.24 no show/medicaid

## 2023-11-12 ENCOUNTER — Encounter: Payer: Self-pay | Admitting: Endocrinology

## 2023-11-12 ENCOUNTER — Ambulatory Visit (INDEPENDENT_AMBULATORY_CARE_PROVIDER_SITE_OTHER): Payer: 59 | Admitting: Endocrinology

## 2023-11-12 VITALS — BP 100/60 | HR 88 | Resp 20 | Ht 63.0 in | Wt 203.6 lb

## 2023-11-12 DIAGNOSIS — Z794 Long term (current) use of insulin: Secondary | ICD-10-CM | POA: Diagnosis not present

## 2023-11-12 DIAGNOSIS — E1165 Type 2 diabetes mellitus with hyperglycemia: Secondary | ICD-10-CM | POA: Diagnosis not present

## 2023-11-12 LAB — POCT GLYCOSYLATED HEMOGLOBIN (HGB A1C): Hemoglobin A1C: 12 % — AB (ref 4.0–5.6)

## 2023-11-12 NOTE — Progress Notes (Signed)
Outpatient Endocrinology Note [place] Lenice Koper, MD  11/12/23  Patient Name: Holly Ortiz   DOB : 11-Dec-1965 MRN # [id number]                                                    REASON OF VISIT: Follow up of type 2 diabetes mellitus  PCP: Mliss Sax, MD  HISTORY OF PRESENT ILLNESS:   Holly Ortiz is a 57 y.o. old female with past medical history listed below, is here for follow up of type 2 diabetes mellitus.   Pertinent Diabetes History: Patient was previously seen by Dr. Lucianne Muss and was last time seen in August 2022. _Diagnosed as type 2 at the age of 18 years.  She was initially treated with oral antidiabetic medications including metformin.  Insulin therapy was started around 2015 timeframe.  Metformin was stopped due to GI intolerance.  Prior medications include Amaryl and Januvia and Ozempic.  Ozempic was stopped in the past due to nausea and vomiting.  As per the office note from July 14, 2021 by Dr. Lucianne Muss her insulin regimen includes Humulin U-500 : 50 units with breakfast, 60 units with lunch, 45 to 55 units with supper and 55 units at bedtime, along with Farxiga 10 mg daily.  Chronic Diabetes Complications : Retinopathy: yes, no records available unknown. Last ophthalmology exam was done on Due, reports following regularly. Nephropathy: no Peripheral neuropathy: no Coronary artery disease: no Stroke: no  Relevant comorbidities and cardiovascular risk factors: Obesity: yes Body mass index is 36.07 kg/m.  Hypertension: yes Hyperlipidemia. Yes, on a statin.  Current / Home Diabetic regimen includes: Farxiga 10 mg daily. Humulin U500 : 50-55 units with breakfast, 25-30 units with lunch and 45 units with supper.  Prior diabetic medications: Ozempic is stopped due to nausea and vomiting. Metformin is stopped due to GI intolerance. She used to be on Amaryl and Januvia in the past. Greggory Keen : Was tried after visit in August 2024, and stopped due to nausea  vomiting and diarrhea.  Glycemic data:   Patient reports she has been checking blood sugar at home and are in the range of 178-500.  Denies hypoglycemia.  She reports she was maintaining glucose log however was eaten by puppy recently.  No glucose data to review in the clinic today.  She has [employer] brand of glucometer.  Hypoglycemia: Patient has no hypoglycemic episodes. Patient has hypoglycemia awareness.  Factors modifying glucose control: 1.  Diabetic diet assessment: Typically eats large breakfast and dinner and smaller portion of lunch.  Occasionally eats bedtime snack usually yogurts. Food includes rice / pasta etc.  2.  Staying active or exercising: No formal exercise.  3.  Medication compliance: compliant some of the time.  Interval history  Hemoglobin A1c slightly improved to 12% today.  Patient reports was not able to tolerate [facility] and he stopped taking it.  Insulin dose and diabetes regimen as noted above.  She reports compliance with insulin however she adjust the dose as mentioned above.  No glucose data to review. She has no other complaints today.  REVIEW OF SYSTEMS As per history of present illness.   PAST MEDICAL HISTORY: 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-I)    Anxiety    on meds   Asthma  childhood   Cataract 2015   bilateral   Constipation    takes Miralax daily   Depression    on meds   Diabetes mellitus    type II-on meds   Hyperlipidemia    on meds   Hypertension    on meds   Lupus 2022   dx 02/2021   Seasonal allergies    Urticaria, chronic    UTI (lower urinary tract infection)     PAST SURGICAL HISTORY: Past Surgical History:  Procedure Laterality Date   ABDOMINAL HYSTERECTOMY  08/07/2019   BREAST ENHANCEMENT SURGERY  1993   breast lift/reduction   CATARACT EXTRACTION, BILATERAL Bilateral 2014   CATARACT EXTRACTION, BILATERAL Bilateral 04/2022   COLONOSCOPY   2014   in [place]   LAPAROSCOPIC VAGINAL HYSTERECTOMY WITH SALPINGO OOPHORECTOMY Bilateral 08/07/2019   Procedure: LAPAROSCOPIC ASSISTED VAGINAL HYSTERECTOMY WITH SALPINGO OOPHORECTOMY, possible abdominal hysterectomy;  Surgeon: Harold Hedge, MD;  Location: [facility] OR;  Service: Gynecology;  Laterality: Bilateral;  possible abdominal hysterectomy pt is diabetic and hypertensive   SOFT TISSUE MASS EXCISION Right 2016   [place]   TUBAL LIGATION  1995   tummy tuck  1993    ALLERGIES: Allergies  Allergen Reactions   Ozempic (0.25 Or 0.5 Mg-Dose) [Semaglutide(0.25 Or 0.5mg -Dos)] Nausea And Vomiting   Actos [Pioglitazone Hydrochloride] Other (See Comments)    Wt gain   Lisinopril     Angioedema, including likely GI involvement   Metformin And Related Diarrhea and Nausea And Vomiting    FAMILY HISTORY:  Family History  Problem Relation Age of Onset   Colon polyps Mother 25   Colon cancer Mother 17   Hypertension Mother    Diabetes Father    Hypertension Father    COPD Father        Cause of death   Hypertension Sister    Diabetes Sister    Cancer Sister        Brain glioblastoma   Diabetes Sister    Hypertension Brother    Hyperlipidemia Other    Heart disease Other    Esophageal cancer Neg Hx    Stomach cancer Neg Hx    Rectal cancer Neg Hx     SOCIAL HISTORY: Social History   Socioeconomic History   Marital status: Married    Spouse name: Kelby Fam   Number of children: 4   Years of education: 13--1.5 years of college   Highest education level: Some college, no degree  Occupational History   Occupation: banking  Tobacco Use   Smoking status: Never    Passive exposure: Never   Smokeless tobacco: Never  Vaping Use   Vaping status: Never Used  Substance and Sexual Activity   Alcohol use: No   Drug use: No   Sexual activity: Yes    Birth control/protection: Surgical    Comment: BTL  Other Topics Concern   Not on file  Social History Narrative   Not on file    Social Determinants of Health   Financial Resource Strain: Low Risk  (10/25/2023)   Overall Financial Resource Strain (CARDIA)    Difficulty of Paying Living Expenses: Not very hard  Food Insecurity: No Food Insecurity (10/25/2023)   Hunger Vital Sign    Worried About Running Out of Food in the Last Year: Never true    Ran Out of Food in the Last Year: Never true  Transportation Needs: No Transportation Needs (10/25/2023)   PRAPARE - Transportation    Lack of Transportation (  Medical): No    Lack of Transportation (Non-Medical): No  Physical Activity: Unknown (10/25/2023)   Exercise Vital Sign    Days of Exercise per Week: 0 days    Minutes of Exercise per Session: Not on file  Stress: Patient Declined (10/25/2023)   [employer] of Occupational Health - Occupational Stress Questionnaire    Feeling of Stress : Patient declined  Social Connections: Moderately Integrated (10/25/2023)   Social Connection and Isolation Panel [NHANES]    Frequency of Communication with Friends and Family: More than three times a week    Frequency of Social Gatherings with Friends and Family: Three times a week    Attends Religious Services: More than 4 times per year    Active Member of Clubs or Organizations: No    Attends Engineer, structural: Not on file    Marital Status: Married    MEDICATIONS:  Current Outpatient Medications  Medication Sig Dispense Refill   acetaminophen (TYLENOL) 500 MG tablet Take 2 tablets (1,000 mg total) by mouth every 6 (six) hours as needed. 60 tablet 0   amLODipine (NORVASC) 10 MG tablet Take 1 tablet (10 mg total) by mouth daily. 90 tablet 3   atorvastatin (LIPITOR) 40 MG tablet Take 1 tablet (40 mg total) by mouth daily. 90 tablet 2   Cholecalciferol (VITAMIN D) 50 MCG (2000 UT) CAPS Take 2,000 Units by mouth daily.      cyclobenzaprine (FLEXERIL) 5 MG tablet TAKE 1 TABLET BY MOUTH AT BEDTIME AS NEEDED FOR MUSCLE SPASMS. 30 tablet 2   dapagliflozin  propanediol (FARXIGA) 10 MG TABS tablet Take 1 tablet (10 mg total) by mouth daily. 90 tablet 3   DULoxetine (CYMBALTA) 30 MG capsule Take 1 capsule (30 mg total) by mouth daily. 90 capsule 0   escitalopram (LEXAPRO) 20 MG tablet Take 1 tablet (20 mg total) by mouth daily. 90 tablet 3   fenofibrate (TRICOR) 145 MG tablet Take 1 tablet (145 mg total) by mouth daily. 90 tablet 1   hydroxychloroquine (PLAQUENIL) 200 MG tablet TAKE 2 TABLETS BY MOUTH EVERY DAY 180 tablet 0   Insulin Pen Needle (BD PEN NEEDLE NANO 2ND GEN) 32G X 4 MM MISC Use four times daily to inject insulin. 200 each 11   insulin regular human CONCENTRATED (HUMULIN R U-500 KWIKPEN) 500 UNIT/ML KwikPen INJECT 40 UNITS UNDER THE SKIN BEFORE BREAKFAST, 20-25 UNITS BEFORE LUNCH AND 40 UNITS AT SUPPER. 18 mL 6   Melatonin 5 MG TABS Take 5 mg by mouth at bedtime as needed.     MIEBO 1.338 GM/ML SOLN Apply 1 drop to eye 4 (four) times daily.     montelukast (SINGULAIR) 10 MG tablet TAKE 1 TABLET BY MOUTH EVERYDAY AT BEDTIME 90 tablet 1   polyethylene glycol (MIRALAX / GLYCOLAX) 17 g packet Take 17 g by mouth daily.     triamcinolone ointment (KENALOG) 0.5 % Apply 1 Application topically 2 (two) times daily as needed. 30 g 1   triamterene-hydrochlorothiazide (MAXZIDE-25) 37.5-25 MG tablet TAKE 1 TABLET BY MOUTH EVERY DAY 90 tablet 1   vitamin B-12 (CYANOCOBALAMIN) 1000 MCG tablet Take 1,000 mcg by mouth daily.     zinc gluconate 50 MG tablet Take 50 mg by mouth daily.      tirzepatide [facility]) 2.5 MG/0.5ML Pen Inject 2.5 mg into the skin once a week. (Patient not taking: Reported on 11/12/2023) 2 mL 0   tirzepatide (MOUNJARO) 5 MG/0.5ML Pen Inject 5 mg into the skin once a  week. After completion of 2.5 mg dose for 4 weeks. (Patient not taking: Reported on 11/12/2023) 6 mL 3   Current Facility-Administered Medications  Medication Dose Route Frequency Provider Last Rate Last Admin   0.9 %  sodium chloride infusion  500 mL Intravenous Once  Jenel Lucks, MD        PHYSICAL EXAM: Vitals:   11/12/23 1538  BP: 100/60  Pulse: 88  Resp: 20  SpO2: 95%  Weight: 203 lb 9.6 oz (92.4 kg)  Height: 5\' 3"  (1.6 m)    Body mass index is 36.07 kg/m.  Wt Readings from Last 3 Encounters:  11/12/23 203 lb 9.6 oz (92.4 kg)  07/26/23 185 lb (83.9 kg)  07/12/23 195 lb 3.2 oz (88.5 kg)    General: Well developed, well nourished female in no apparent distress.  HEENT: AT/Pace, no external lesions.  Eyes: Conjunctiva clear and no icterus. Neck: Neck supple  Lungs: Respirations not labored Neurologic: Alert, oriented, normal speech Extremities / Skin: Dry. No sores or rashes noted.  Psychiatric: Does not appear depressed or anxious  Diabetic Foot Exam - Simple   No data filed   LABS Reviewed Lab Results  Component Value Date   HGBA1C 12.0 (A) 11/12/2023   HGBA1C 14.5 (A) 07/12/2023   HGBA1C 11.8 (H) 10/02/2022   Lab Results  Component Value Date   FRUCTOSAMINE 307 (H) 04/14/2021   FRUCTOSAMINE 314 (H) 09/30/2020   FRUCTOSAMINE 408 (H) 04/09/2020   Lab Results  Component Value Date   CHOL 172 04/24/2023   HDL 42.10 04/24/2023   LDLCALC 85 07/11/2021   LDLDIRECT 102.0 04/24/2023   TRIG 250.0 (H) 04/24/2023   CHOLHDL 4 04/24/2023   Lab Results  Component Value Date   MICRALBCREAT 3.8 10/02/2022   MICRALBCREAT 2.3 02/03/2021   Lab Results  Component Value Date   CREATININE 0.58 09/11/2023   Lab Results  Component Value Date   GFR 101.68 04/24/2023    ASSESSMENT / PLAN  1. Uncontrolled type 2 diabetes mellitus with hyperglycemia, with long-term current use of insulin (HCC)    Diabetes Mellitus type 2 poorly controlled. - Diabetic status / severity: Worsening.  Lab Results  Component Value Date   HGBA1C 12.0 (A) 11/12/2023    - Hemoglobin A1c goal : <7%  Discussed about diabetes mellitus and potential chronic complications.  Discussed about importance of controlling blood sugar to prevent chronic  complications.  Discussed about compliance with diabetic medication including insulin.  Concerned about compliance with insulin and adjustment of insulin dose based on meals and blood sugar.  No glucose data to appropriately adjust insulin dose.  She was not able to tolerate multiple GLP-1 receptor agonist in the past.  - Medications: See below empirically increasing dose of U-500 insulin by 10 units on her current dose.  Continue Farxiga 10 mg daily. Adjust Humulin U500 : 35-65 units with breakfast, 35-45 units with lunch and 55 units with supper.  Asked to bring glucometer or glucose log to review in the follow-up visit.  Asked to call our clinic to review glucose data and probably able to adjust diabetes regimen in between the visits.  - Home glucose testing: Before meals and bedtime 4 times a day.  Offered continuous glucose monitoring, she does not want to use CGM. - Discussed/ Gave Hypoglycemia treatment plan.  # Consult : not required at this time.   # Annual urine for microalbuminuria/ creatinine ratio, no microalbuminuria currently, continue ACE/ARB , not on ACE/ARB at this  time. Last  Lab Results  Component Value Date   MICRALBCREAT 3.8 10/02/2022   -She had urination prior to visit declined to have urine microalbumin creatinine ratio today.  # Foot check nightly.  # Annual dilated diabetic eye exams.  Advised to have diabetic eye exam.  - Diet: Make healthy diabetic food choices - Life style / activity / exercise: Discussed about regular exercise.  2. Blood pressure  -  BP Readings from Last 1 Encounters:  11/12/23 100/60    - Control is in target.  - No change in current plans.  3. Lipid status / Hyperlipidemia - Last  Lab Results  Component Value Date   LDLCALC 85 07/11/2021   - Continue atorvastatin 40 mg daily.  Managed by primary care provider.  Diagnoses and all orders for this visit:  Uncontrolled type 2 diabetes mellitus with hyperglycemia,  with long-term current use of insulin (HCC) -     POCT glycosylated hemoglobin (Hb A1C)    DISPOSITION Follow up in clinic in 3  months suggested.   All questions answered and patient verbalized understanding of the plan.  [place] Shakti Fleer, MD [facility] Endocrinology [facility] [address] [place], [address] [place], [place] [address] Phone # [phone]  At least part of this note was generated using voice recognition software. Inadvertent word errors may have occurred, which were not recognized during the proofreading process.

## 2023-11-12 NOTE — Patient Instructions (Addendum)
Farxiga 10 mg daily. Humulin U500 : 35-65 units with breakfast, 35-45 units with lunch and 55 units with supper.  Bring glucose log in follow up.

## 2024-01-14 NOTE — Progress Notes (Unsigned)
 Office Visit Note  Patient: Holly Ortiz             Date of Birth: 1966/08/11           MRN: [id number]             PCP: Mliss Sax, MD Referring: Mliss Sax,* Visit Date: 01/28/2024   Subjective:  Follow-up (Patient states she tends to fall a lot now. Patient states she recently had a fall Saturday when mopping her floors. )   History of Present Illness: Holly Ortiz is a 58 y.o. 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    Previous HPI 07/26/2023 Holly Ortiz is a 58 y.o. 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  After the last visit she tried resuming the duloxetine 30 mg daily but felt some intermittent increased anxiety or agitation with the medicine so stopped taking it and the symptoms improved.  At least 1 episode of increased joint swelling most prominent at her right hand and the third finger this was about 3 weeks ago and resolved over the course of about 1 week.  Some itching and rashes in a few areas most persistently on the right elbow with a patch of red itchy skin this is partially improved with topical emollients. Currently she feels very poorly was started on Mounjaro by her endocrinologist took the first dose 2 weeks ago.  Since she started this experience severe nausea loss of appetite and loose watery diarrhea with multiple small bowel movements after any solid oral intake.  Took the second dose 1 week ago and symptoms have been even worse often not even tolerating broth or other bland liquids.  Feels very drained and fatigued now.  Broke out in hives throughout her torso and worsening of skin rash on the arms for the past few days.  She says this is pretty typical reaction for her whenever she gets sick.   Previous HPI 05/03/2023 Holly Ortiz is a 58 y.o. female here for follow up for differential  connective tissue disease on hydroxychloroquine 400 mg daily.  Her previously worsened widespread symptoms of fatigue did get somewhat better after our visit last month feels the duloxetine was beneficial for joint pains also with her brain fog.  Her prescription ran out before this visit and so she came off of it and noticed some worsening in pain again.  But she suffered a fall 3 weeks ago down the stairs and hit the back of her head.  Initially did not think much of it subsequently has been feeling worsening headaches in the back of the head and pain in her neck area.  Also is noticing numbness radiating down to the fourth and fifth fingers in both hands.  She was seen at the ED and had a CT scan with no evidence of cervical spine injury and no hemorrhage.   Previous HPI 03/12/23 Holly Ortiz is a 58 y.o. female here for follow up for UCTD on hydroxychloroquine 400 mg daily symptoms currently in exacerbation for the past 3 weeks.  She is having increased pain in multiple areas neck shoulders arms as well as the low back and tailbone region.  She is feeling persistently fatigued every day also increased headaches.  She notices some more swelling in both hands then has been present for at least the past year.  She does describe very poor sleep during these few  weeks and a very high level of work stress contributing.   Previous HPI 01/25/23 Holly Ortiz is a 58 y.o. female here for follow up for UCTD with inflammatory arthritis and chronic urticaria on hydroxychloroquine 400 mg daily.  Past few months she reports worsening fatigue and concentration and short-term memory problems.  Also increased pain especially in the low back bilaterally.  Does not have pain or numbness radiation into the legs.  She is experienced some weakness and difficulty standing back up from the floor or crouched position.  Frequently wakes up with low back pain that improves after taking 2 regular Advil and moving around for a  while.  Has not had severe outbreak of hives but keeps getting a small number of these around the neck and back of the scalp.  Follow-up with primary care office in December still had poorly controlled diabetes with hemoglobin A1c 11.8.  Skin rash behind the right knee is unchanged has a new erythematous rash in the right elbow. She has been under increased stress with work changes after her [employer] branch angry and was close now working in [place] still getting used to the change in location and coworkers.   Previous HPI 07/31/22 Holly Ortiz is a 58 y.o. female here for follow up for UCTD with symptoms of inflammatory arthritis and chronic urticaria on HCQ 400 mg daily. She has generally been doing well without severe flare ups. She has followed up with her ophthalmologist but not remembered to specifically address her HCQ screening exam. Since last week she is having new left flank pain radiating around the side somewhat along bottom of ribs. She does not recall any preceding illness or injury. She does notice increased urinary frequency during this time. No dysuria or gross hematuria. She has a history of a kidney stone once about 30 years ago during pregnancy and last known UTI about 10 years ago. She has a new dog and has a lot of new scratches from this.   Previous HPI 01/31/2022 Holly Ortiz is a 58 y.o. female here for follow up for inflammatory arthritis and chronic urticaria on HCQ 400 mg daily. She had a significant flare up of symptoms around Thanksgiving that resolved after a couple weeks without specific intervention. She takes advil as needed when joint pain and swelling are worse and when really bad sometimes flexeril. She had another shorter episode of symptoms for about 4 days earlier this year. Most common joint swelling lately is at the 3rd knuckle of each hand, knee swelling has been less common. She did not have many episodes of hives outside of these flares. She is  starting to have nasal congestion and hoarseness of voice she thinks is seasonal allergies returning.   Previous HPI 08/02/21 [name] is a 58 y.o. female here for follow up for inflammatory arthritis and chronic urticarial rashes on hydroxychloroquine 400 mg PO daily. Due to low back and knee pain that seemed noninflammatory also recommended trial of oral muscle relaxant as needed at night.  So far she feels a very good improvement in her overall symptoms.  She has not had any recurrence of skin rash since at least 2 months ago.  Joint pains are doing well.  She still has some of the fatigue and concentration difficulty though this is also partially better.   02/22/21 Kailynne Ferrington is a 58 y.o. female here for evaluation of positive ANA with chronic joint pains, fatigue, and intermittent hives for years. She describes symptoms  starting since around 2007. She has had pain in joints and muscles at various areas to some extent most of the time. She also developed a problem with recurrent urticaria rashes at multiple areas, most often after increased stress or other provocative events such as infection and antibiotic use. Since that time she has had workup with allergy and immunology clinics and dermatology clinic with no specific cause able to be found. A more recent symptom is change in sensation in her legs and feet. She notices swelling and discomfort after prolonged standing or prolonged sitting. She had a few episodes of becoming dizzy and unstable after standing and a few falls.   Labs reviewed 01/2021 ANA 1:80 homogenous, speckled CBC wnl TSH wnl CK wnl CRP wnl   Review of Systems  Constitutional:  Positive for fatigue.  HENT:  Positive for mouth dryness. Negative for mouth sores.   Eyes:  Positive for dryness.  Respiratory:  Negative for shortness of breath.   Cardiovascular:  Positive for chest pain. Negative for palpitations.  Gastrointestinal:  Positive for constipation and  diarrhea. Negative for blood in stool.  Endocrine: Negative for increased urination.  Genitourinary:  Positive for involuntary urination.  Musculoskeletal:  Positive for joint pain, gait problem, joint pain, joint swelling, myalgias, muscle weakness, morning stiffness, muscle tenderness and myalgias.  Skin:  Positive for color change, rash, hair loss and sensitivity to sunlight.  Allergic/Immunologic: Negative for susceptible to infections.  Neurological:  Positive for dizziness and headaches.  Hematological:  Negative for swollen glands.  Psychiatric/Behavioral:  Positive for sleep disturbance. Negative for depressed mood. The patient is nervous/anxious.     PMFS History:  Patient Active Problem List   Diagnosis Date Noted   Headache 05/03/2023   Other fatigue 01/25/2023   Rash and other nonspecific skin eruption 01/25/2023   Urinary frequency 08/02/2022   Left flank pain 07/31/2022   Pain in left knee 05/03/2021   Arthralgia 02/24/2021   Undifferentiated connective tissue disease (HCC) 02/22/2021   Iron deficiency 07/07/2020   Left low back pain 01/14/2020   Anemia 12/02/2019   Lightheadedness 12/02/2019   Menorrhagia 08/07/2019   Abnormal uterine bleeding 08/07/2019   Anxiety and depression 07/03/2018   DKA (diabetic ketoacidosis) (HCC) 03/28/2018   Tachycardia 03/28/2018   Urticaria 03/28/2018   Hyperosmolar hyperglycemia 01/04/2018   AKI (acute kidney injury) (HCC) 01/04/2018   Hyperglycemia 01/03/2018   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-I)    Asthma    Type 2 diabetes mellitus with complication, with long-term current use of insulin (HCC)    Elevated cholesterol    Essential hypertension     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-I)    Anxiety    on meds   Asthma    childhood   Cataract 2015   bilateral   Constipation    takes Miralax daily   Depression    on meds    Diabetes mellitus    type II-on meds   Hyperlipidemia    on meds   Hypertension    on meds   Lupus 2022   dx 02/2021   Seasonal allergies    Urticaria, chronic    UTI (lower urinary tract infection)     Family History  Problem Relation Age of Onset   Colon polyps Mother 74   Colon cancer Mother 56   Hypertension Mother    Diabetes Father    Hypertension Father  COPD Father        Cause of death   Hypertension Sister    Diabetes Sister    Cancer Sister        Brain glioblastoma   Diabetes Sister    Hypertension Brother    Hyperlipidemia Other    Heart disease Other    Esophageal cancer Neg Hx    Stomach cancer Neg Hx    Rectal cancer Neg Hx    Past Surgical History:  Procedure Laterality Date   ABDOMINAL HYSTERECTOMY  08/07/2019   BREAST ENHANCEMENT SURGERY  1993   breast lift/reduction   CATARACT EXTRACTION, BILATERAL Bilateral 2014   CATARACT EXTRACTION, BILATERAL Bilateral 04/2022   COLONOSCOPY  2014   in [place]   LAPAROSCOPIC VAGINAL HYSTERECTOMY WITH SALPINGO OOPHORECTOMY Bilateral 08/07/2019   Procedure: LAPAROSCOPIC ASSISTED VAGINAL HYSTERECTOMY WITH SALPINGO OOPHORECTOMY, possible abdominal hysterectomy;  Surgeon: Harold Hedge, MD;  Location[facility] OR;  Service: Gynecology;  Laterality: Bilateral;  possible abdominal hysterectomy pt is diabetic and hypertensive   SOFT TISSUE MASS EXCISION Right 2016   [place]   TUBAL LIGATION  1995   tummy tuck  1993   Social History   Social History Narrative   Not on file   Immunization History  Administered Date(s) Administered   DTaP 07/04/2009   Influenza,inj,Quad PF,6+ Mos 01/05/2018, 08/28/2018, 09/05/2019, 10/05/2020   PFIZER(Purple Top)SARS-COV-2 Vaccination 02/03/2020, 03/02/2020   Pneumococcal Polysaccharide-23 01/05/2018   Tdap 12/04/2012   Zoster Recombinant(Shingrix) 06/29/2021     Objective: Vital Signs: BP 110/71 (BP Location: Left Arm, Patient Position: Sitting, Cuff Size: Large)   Pulse 98    Resp 14   Ht 5\' 3"  (1.6 m)   Wt 200 lb (90.7 kg)   LMP 05/13/2019 Comment: patient had not had her period in a year and it came back in May and June 2020  BMI 35.43 kg/m    Physical Exam Eyes:     Conjunctiva/sclera: Conjunctivae normal.  Cardiovascular:     Rate and Rhythm: Normal rate and regular rhythm.  Pulmonary:     Effort: Pulmonary effort is normal.     Breath sounds: Normal breath sounds.  Musculoskeletal:     Right lower leg: No edema.     Left lower leg: No edema.  Lymphadenopathy:     Cervical: No cervical adenopathy.  Skin:    General: Skin is warm and dry.     Findings: Rash present.     Comments: Upper lip swelling Faint erythematous patches on upper chest Faintly hyperpigmented, excoriated patches on elbows and forearms  Scab over front of left knee  Neurological:     Mental Status: She is alert.  Psychiatric:        Mood and Affect: Mood normal.      Musculoskeletal Exam:  Shoulders full abduction restricted with upper back muscle stiffness Lateral upper arm and elbow tenderness to pressure and with movement, no swelling Wrists full ROM no tenderness or swelling Fingers full ROM no tenderness or swelling Low back tenderness to pressure along iliac crest both sides, no radiation Knees full ROM no tenderness or swelling   Investigation: No additional findings.  Imaging: No results found.  Recent Labs: Lab Results  Component Value Date   WBC 7.3 08/27/2023   HGB 12.1 08/27/2023   PLT 345 08/27/2023   NA 138 09/11/2023   K 4.9 09/11/2023   CL 98 09/11/2023   CO2 31 09/11/2023   GLUCOSE 444 (H) 09/11/2023   BUN 13 09/11/2023  CREATININE 0.58 09/11/2023   BILITOT 0.3 07/26/2023   ALKPHOS 73 04/24/2023   AST 8 (L) 07/26/2023   ALT 16 07/26/2023   PROT 7.3 07/26/2023   ALBUMIN 4.0 04/24/2023   CALCIUM 9.8 09/11/2023   GFRAA >60 07/30/2019    Speciality Comments: PLQ Eye Exam: 10/12/2022 WNL @ [facility] Patient  will schedule Eye Exam-patient to call and schedule  Procedures:  No procedures performed Allergies: Ozempic (0.25 or 0.5 mg-dose) [semaglutide(0.25 or 0.5mg -dos)], Actos [pioglitazone hydrochloride], Lisinopril, and Metformin and related   Assessment / Plan:     Visit Diagnoses: Undifferentiated connective tissue disease (HCC)  High risk medication use - Hydroxychloroquine 400 mg daily. PLQ Eye Exam: 10/12/2022 WNL. Needs an updated PLQ eye exam.  Anxiety and depression - Trial of resuming duloxetine was not well-tolerated.  Currently not in major exacerbation.  Medication side effect  ***  Orders: No orders of the defined types were placed in this encounter.  No orders of the defined types were placed in this encounter.    Follow-Up Instructions: No follow-ups on file.   Fuller Plan, MD  Note - This record has been created using [employer].  Chart creation errors have been sought, but may not always  have been located. Such creation errors do not reflect on  the standard of medical care.

## 2024-01-15 ENCOUNTER — Other Ambulatory Visit: Payer: Self-pay | Admitting: Internal Medicine

## 2024-01-15 DIAGNOSIS — M359 Systemic involvement of connective tissue, unspecified: Secondary | ICD-10-CM

## 2024-01-15 NOTE — Telephone Encounter (Signed)
Last Fill: 10/17/2023  Eye exam: 10/12/2022  I called patient,  Patient will schedule Eye Exam-shc 01/15/2024. Patient stated she is having some issues with her vision.   Labs: 09/11/2023 BMP Blood sugar remains very high at 444.  Her kidney function never completely returned to normal though with estimated GFR of 106 that is great.  The previous decrease was probably related to dehydration.  08/27/2023 CBC from yesterday is normal. She had an abnormal increase in creatinine/decreased GFR on lab from 8/22 so recommended we recheck this again. I do not see that we got a new BMP with GFR on her though. Please check if Lupita Leash would be able to add this from what she drew? Otherwise we will need to ask Ms. Gervais about getting it checked.  Next Visit: 01/28/2024  Last Visit: 07/26/2023   [id number] connective tissue disease   Current Dose per office note 07/26/2023: hydroxychloroquine 400 mg daily.   Okay to refill Plaquenil?

## 2024-01-28 ENCOUNTER — Encounter: Payer: Self-pay | Admitting: Internal Medicine

## 2024-01-28 ENCOUNTER — Ambulatory Visit: Payer: 59 | Attending: Internal Medicine | Admitting: Internal Medicine

## 2024-01-28 VITALS — BP 110/71 | HR 98 | Resp 14 | Ht 63.0 in | Wt 200.0 lb

## 2024-01-28 DIAGNOSIS — Z79899 Other long term (current) drug therapy: Secondary | ICD-10-CM | POA: Diagnosis not present

## 2024-01-28 DIAGNOSIS — T887XXA Unspecified adverse effect of drug or medicament, initial encounter: Secondary | ICD-10-CM

## 2024-01-28 DIAGNOSIS — L509 Urticaria, unspecified: Secondary | ICD-10-CM

## 2024-01-28 DIAGNOSIS — R21 Rash and other nonspecific skin eruption: Secondary | ICD-10-CM

## 2024-01-28 DIAGNOSIS — M359 Systemic involvement of connective tissue, unspecified: Secondary | ICD-10-CM

## 2024-01-28 DIAGNOSIS — F419 Anxiety disorder, unspecified: Secondary | ICD-10-CM

## 2024-01-28 DIAGNOSIS — F32A Depression, unspecified: Secondary | ICD-10-CM

## 2024-02-05 ENCOUNTER — Encounter: Payer: Self-pay | Admitting: Internal Medicine

## 2024-02-06 LAB — COMPLETE METABOLIC PANEL WITH GFR
AG Ratio: 1.2 (calc) (ref 1.0–2.5)
ALT: 13 U/L (ref 6–29)
AST: 11 U/L (ref 10–35)
Albumin: 4.1 g/dL (ref 3.6–5.1)
Alkaline phosphatase (APISO): 76 U/L (ref 37–153)
BUN: 17 mg/dL (ref 7–25)
CO2: 29 mmol/L (ref 20–32)
Calcium: 9.8 mg/dL (ref 8.6–10.4)
Chloride: 101 mmol/L (ref 98–110)
Creat: 0.65 mg/dL (ref 0.50–1.03)
Globulin: 3.3 g/dL (ref 1.9–3.7)
Glucose, Bld: 221 mg/dL — ABNORMAL HIGH (ref 65–99)
Potassium: 4 mmol/L (ref 3.5–5.3)
Sodium: 141 mmol/L (ref 135–146)
Total Bilirubin: 0.4 mg/dL (ref 0.2–1.2)
Total Protein: 7.4 g/dL (ref 6.1–8.1)
eGFR: 103 mL/min/{1.73_m2} (ref >=60)

## 2024-02-06 LAB — CBC WITH DIFFERENTIAL/PLATELET
Absolute Lymphocytes: 1625 {cells}/uL (ref 850–3900)
Absolute Monocytes: 491 {cells}/uL (ref 200–950)
Basophils Absolute: 38 {cells}/uL (ref 0–200)
Basophils Relative: 0.6 %
Eosinophils Absolute: 189 {cells}/uL (ref 15–500)
Eosinophils Relative: 3 %
HCT: 41.8 % (ref 35.0–45.0)
Hemoglobin: 12.9 g/dL (ref 11.7–15.5)
MCH: 25.5 pg — ABNORMAL LOW (ref 27.0–33.0)
MCHC: 30.9 g/dL — ABNORMAL LOW (ref 32.0–36.0)
MCV: 82.8 fL (ref 80.0–100.0)
MPV: 9.3 fL (ref 7.5–12.5)
Monocytes Relative: 7.8 %
Neutro Abs: 3956 {cells}/uL (ref 1500–7800)
Neutrophils Relative %: 62.8 %
Platelets: 353 10*3/uL (ref 140–400)
RBC: 5.05 10*6/uL (ref 3.80–5.10)
RDW: 14 % (ref 11.0–15.0)
Total Lymphocyte: 25.8 %
WBC: 6.3 10*3/uL (ref 3.8–10.8)

## 2024-02-06 LAB — CP CHRONIC URTICARIA INDEX PANEL
Histamine Release: <16 % (ref <16)
TSH: 1.4 m[IU]/L (ref 0.40–4.50)
Thyroglobulin Ab: <1 [IU]/mL (ref <=1)
Thyroperoxidase Ab SerPl-aCnc: <1 [IU]/mL (ref <9)

## 2024-02-06 LAB — C3 AND C4
C3 Complement: 188 mg/dL (ref 83–193)
C4 Complement: 45 mg/dL (ref 15–57)

## 2024-02-06 LAB — SEDIMENTATION RATE: Sed Rate: 36 mm/h — ABNORMAL HIGH (ref 0–30)

## 2024-02-12 ENCOUNTER — Encounter: Payer: Self-pay | Admitting: Endocrinology

## 2024-02-12 ENCOUNTER — Ambulatory Visit (INDEPENDENT_AMBULATORY_CARE_PROVIDER_SITE_OTHER): Payer: 59 | Admitting: Endocrinology

## 2024-02-12 VITALS — BP 116/70 | HR 97 | Resp 20 | Ht 63.0 in | Wt 208.6 lb

## 2024-02-12 DIAGNOSIS — R319 Hematuria, unspecified: Secondary | ICD-10-CM

## 2024-02-12 DIAGNOSIS — E1165 Type 2 diabetes mellitus with hyperglycemia: Secondary | ICD-10-CM

## 2024-02-12 DIAGNOSIS — Z794 Long term (current) use of insulin: Secondary | ICD-10-CM

## 2024-02-12 LAB — POCT GLYCOSYLATED HEMOGLOBIN (HGB A1C): Hemoglobin A1C: 12 % — AB (ref 4.0–5.6)

## 2024-02-12 MED ORDER — BLOOD GLUCOSE TEST VI STRP: 1.0000 | ORAL_STRIP | Freq: Three times a day (TID) | 3 refills | Status: DC

## 2024-02-12 MED ORDER — LANCET DEVICE MISC: 1.0000 | Freq: Three times a day (TID) | 0 refills | Status: AC

## 2024-02-12 MED ORDER — LANCETS MISC. MISC: 1.0000 | Freq: Three times a day (TID) | 3 refills | Status: AC

## 2024-02-12 MED ORDER — GLIMEPIRIDE 4 MG PO TABS: 4.0000 mg | ORAL_TABLET | Freq: Every day | ORAL | 3 refills | Status: DC

## 2024-02-12 MED ORDER — BLOOD GLUCOSE MONITORING SUPPL DEVI: 1.0000 | Freq: Three times a day (TID) | 0 refills | Status: AC

## 2024-02-12 NOTE — Progress Notes (Signed)
 Outpatient Endocrinology Note Holly Boby Eyer, MD  02/12/24  Patient Name: Holly Ortiz   DOB : Jan 25, 1966 MRN # [id number]                                                    REASON OF VISIT: Follow up of type 2 diabetes mellitus  PCP: Mliss Sax, MD  HISTORY OF PRESENT ILLNESS:   Holly Ortiz is a 58 y.o. old female with past medical history listed below, is here for follow up of type 2 diabetes mellitus.   Pertinent Diabetes History: Patient was previously seen by Dr. Lucianne Muss and was last time seen in August 2022. _Diagnosed as type 2 at the age of 22 years.  She was initially treated with oral antidiabetic medications including metformin.  Insulin therapy was started around 2015 timeframe.  Patient has uncontrolled type 2 diabetes mellitus.    Chronic Diabetes Complications : Retinopathy: yes, no records available unknown. Last ophthalmology exam was done on Due, reports following regularly. Nephropathy: no Peripheral neuropathy: no Coronary artery disease: no Stroke: no  Relevant comorbidities and cardiovascular risk factors: Obesity: yes Body mass index is 36.95 kg/m.  Hypertension: yes Hyperlipidemia. Yes, on a statin.  Current / Home Diabetic regimen includes: Farxiga 10 mg daily. Humulin U500 : 50-55 units with breakfast, 25-30 units with lunch ( sometime skip) and 45 - 50 units with supper, 30-35 units at bedtime.  Prior diabetic medications: Ozempic is stopped due to nausea and vomiting. Metformin is stopped due to GI intolerance. She used to be on Amaryl and Januvia in the past. Greggory Keen : Was tried after visit in August 2024, and stopped due to nausea vomiting and diarrhea.  Glycemic data:   Not able to download glucometer in the clinic today, will dose readings reviewed from glucometer directly, some of the blood sugar as noted below.  Mostly hyperglycemia.  She has been checking in the morning fasting and in the evening 1-2 times a day.  414,  339, 272, 429,229, 194, 301, 295, 439, 394  She has [employer] brand of glucometer.  Hypoglycemia: Patient has no hypoglycemic episodes. Patient has hypoglycemia awareness.  Factors modifying glucose control: 1.  Diabetic diet assessment: Typically eats large breakfast and dinner and smaller portion of lunch.  Occasionally eats bedtime snack usually yogurts. Food includes rice / pasta etc.  2.  Staying active or exercising: No formal exercise.  3.  Medication compliance: compliant some of the time.  Interval history  Diabetes regimen as noted and reviewed above.  Patient started to take insulin at bedtime.  She is mostly skipping insulin at lunchtime, not convenient to take at the workplace she is quite busy at that time.  Hemoglobin A1c staying high 12% today.  She complains of random blood in the urine, happened about 2-3 times in last 2 months, denies dysuria however may be increased frequency, no fever, has not been diagnosed with UTI or treated for UTI.  She reports she had noticed blood about 2 weeks ago.  She has history of hysterectomy.  Discussed that it is important to check urine test preferably at the time when she has blood in the urine.  Like to talk with primary care provider if she noticed blood in the urine again.     REVIEW OF SYSTEMS As per history of present illness.  PAST MEDICAL HISTORY: 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-I)    Anxiety    on meds   Asthma    childhood   Cataract 2015   bilateral   Constipation    takes Miralax daily   Depression    on meds   Diabetes mellitus    type II-on meds   Hyperlipidemia    on meds   Hypertension    on meds   Lupus 2022   dx 02/2021   Seasonal allergies    Urticaria, chronic    UTI (lower urinary tract infection)     PAST SURGICAL HISTORY: Past Surgical History:  Procedure Laterality Date   ABDOMINAL HYSTERECTOMY  08/07/2019   BREAST  ENHANCEMENT SURGERY  1993   breast lift/reduction   CATARACT EXTRACTION, BILATERAL Bilateral 2014   CATARACT EXTRACTION, BILATERAL Bilateral 04/2022   COLONOSCOPY  2014   in [place]   LAPAROSCOPIC VAGINAL HYSTERECTOMY WITH SALPINGO OOPHORECTOMY Bilateral 08/07/2019   Procedure: LAPAROSCOPIC ASSISTED VAGINAL HYSTERECTOMY WITH SALPINGO OOPHORECTOMY, possible abdominal hysterectomy;  Surgeon: Harold Hedge, MD;  Location: [facility] OR;  Service: Gynecology;  Laterality: Bilateral;  possible abdominal hysterectomy pt is diabetic and hypertensive   SOFT TISSUE MASS EXCISION Right 2016   [place]   TUBAL LIGATION  1995   tummy tuck  1993    ALLERGIES: Allergies  Allergen Reactions   Ozempic (0.25 Or 0.5 Mg-Dose) [Semaglutide(0.25 Or 0.5mg -Dos)] Nausea And Vomiting   Actos [Pioglitazone Hydrochloride] Other (See Comments)    Wt gain   Lisinopril     Angioedema, including likely GI involvement   Metformin And Related Diarrhea and Nausea And Vomiting    FAMILY HISTORY:  Family History  Problem Relation Age of Onset   Colon polyps Mother 31   Colon cancer Mother 62   Hypertension Mother    Diabetes Father    Hypertension Father    COPD Father        Cause of death   Hypertension Sister    Diabetes Sister    Cancer Sister        Brain glioblastoma   Diabetes Sister    Hypertension Brother    Hyperlipidemia Other    Heart disease Other    Esophageal cancer Neg Hx    Stomach cancer Neg Hx    Rectal cancer Neg Hx     SOCIAL HISTORY: Social History   Socioeconomic History   Marital status: Married    Spouse name: Kelby Fam   Number of children: 4   Years of education: 13--1.5 years of college   Highest education level: Some college, no degree  Occupational History   Occupation: banking  Tobacco Use   Smoking status: Never    Passive exposure: Never   Smokeless tobacco: Never  Vaping Use   Vaping status: Never Used  Substance and Sexual Activity   Alcohol use: No   Drug use:  No   Sexual activity: Yes    Birth control/protection: Surgical    Comment: BTL  Other Topics Concern   Not on file  Social History Narrative   Not on file   Social Drivers of Health   Financial Resource Strain: Low Risk  (10/25/2023)   Overall Financial Resource Strain (CARDIA)    Difficulty of Paying Living Expenses: Not very hard  Food Insecurity: No Food Insecurity (10/25/2023)   Hunger Vital Sign    Worried About Running Out of Food in the Last  Year: Never true    Ran Out of Food in the Last Year: Never true  Transportation Needs: No Transportation Needs (10/25/2023)   PRAPARE - Administrator, Civil Service (Medical): No    Lack of Transportation (Non-Medical): No  Physical Activity: Unknown (10/25/2023)   Exercise Vital Sign    Days of Exercise per Week: 0 days    Minutes of Exercise per Session: Not on file  Stress: Patient Declined (10/25/2023)   [employer] of Occupational Health - Occupational Stress Questionnaire    Feeling of Stress : Patient declined  Social Connections: Moderately Integrated (10/25/2023)   Social Connection and Isolation Panel [NHANES]    Frequency of Communication with Friends and Family: More than three times a week    Frequency of Social Gatherings with Friends and Family: Three times a week    Attends Religious Services: More than 4 times per year    Active Member of Clubs or Organizations: No    Attends Engineer, structural: Not on file    Marital Status: Married    MEDICATIONS:  Current Outpatient Medications  Medication Sig Dispense Refill   Blood Glucose Monitoring Suppl DEVI 1 each by Does not apply route in the morning, at noon, and at bedtime. May substitute to any manufacturer covered by patient's insurance. 1 each 0   glimepiride (AMARYL) 4 MG tablet Take 1 tablet (4 mg total) by mouth daily before breakfast. 90 tablet 3   Glucose Blood (BLOOD GLUCOSE TEST STRIPS) STRP 1 each by In Vitro route in the  morning, at noon, and at bedtime. May substitute to any manufacturer covered by patient's insurance. 100 each 3   Lancet Device MISC 1 each by Does not apply route in the morning, at noon, and at bedtime. May substitute to any manufacturer covered by patient's insurance. 1 each 0   Lancets Misc. MISC 1 each by Does not apply route in the morning, at noon, and at bedtime. May substitute to any manufacturer covered by patient's insurance. 100 each 3   acetaminophen (TYLENOL) 500 MG tablet Take 2 tablets (1,000 mg total) by mouth every 6 (six) hours as needed. 60 tablet 0   amLODipine (NORVASC) 10 MG tablet Take 1 tablet (10 mg total) by mouth daily. 90 tablet 3   atorvastatin (LIPITOR) 40 MG tablet Take 1 tablet (40 mg total) by mouth daily. 90 tablet 2   Cholecalciferol (VITAMIN D) 50 MCG (2000 UT) CAPS Take 2,000 Units by mouth daily.      cyclobenzaprine (FLEXERIL) 5 MG tablet TAKE 1 TABLET BY MOUTH AT BEDTIME AS NEEDED FOR MUSCLE SPASMS. 30 tablet 2   dapagliflozin propanediol (FARXIGA) 10 MG TABS tablet Take 1 tablet (10 mg total) by mouth daily. 90 tablet 3   escitalopram (LEXAPRO) 20 MG tablet Take 1 tablet (20 mg total) by mouth daily. 90 tablet 3   fenofibrate (TRICOR) 145 MG tablet Take 1 tablet (145 mg total) by mouth daily. 90 tablet 1   hydroxychloroquine (PLAQUENIL) 200 MG tablet TAKE 2 TABLETS BY MOUTH EVERY DAY 60 tablet 0   Insulin Pen Needle (BD PEN NEEDLE NANO 2ND GEN) 32G X 4 MM MISC Use four times daily to inject insulin. 200 each 11   insulin regular human CONCENTRATED (HUMULIN R U-500 KWIKPEN) 500 UNIT/ML KwikPen INJECT 40 UNITS UNDER THE SKIN BEFORE BREAKFAST, 20-25 UNITS BEFORE LUNCH AND 40 UNITS AT SUPPER. 18 mL 6   Melatonin 5 MG TABS Take 5 mg by  mouth at bedtime as needed.     MIEBO 1.338 GM/ML SOLN Apply 1 drop to eye 4 (four) times daily.     montelukast (SINGULAIR) 10 MG tablet TAKE 1 TABLET BY MOUTH EVERYDAY AT BEDTIME 90 tablet 1   polyethylene glycol (MIRALAX /  GLYCOLAX) 17 g packet Take 17 g by mouth daily. (Patient not taking: Reported on 01/28/2024)     triamcinolone ointment (KENALOG) 0.5 % Apply 1 Application topically 2 (two) times daily as needed. 30 g 1   triamterene-hydrochlorothiazide (MAXZIDE-25) 37.5-25 MG tablet TAKE 1 TABLET BY MOUTH EVERY DAY (Patient not taking: Reported on 01/28/2024) 90 tablet 1   vitamin B-12 (CYANOCOBALAMIN) 1000 MCG tablet Take 1,000 mcg by mouth daily.     zinc gluconate 50 MG tablet Take 50 mg by mouth daily.      Current Facility-Administered Medications  Medication Dose Route Frequency Provider Last Rate Last Admin   0.9 %  sodium chloride infusion  500 mL Intravenous Once Jenel Lucks, MD        PHYSICAL EXAM: Vitals:   02/12/24 0819  BP: 116/70  Pulse: 97  Resp: 20  SpO2: 98%  Weight: 208 lb 9.6 oz (94.6 kg)  Height: 5\' 3"  (1.6 m)    Body mass index is 36.95 kg/m.  Wt Readings from Last 3 Encounters:  02/12/24 208 lb 9.6 oz (94.6 kg)  01/28/24 200 lb (90.7 kg)  11/12/23 203 lb 9.6 oz (92.4 kg)    General: Well developed, well nourished female in no apparent distress.  HEENT: AT/[place], no external lesions.  Eyes: Conjunctiva clear and no icterus. Neck: Neck supple  Lungs: Respirations not labored Neurologic: Alert, oriented, normal speech Extremities / Skin: Dry. No sores or rashes noted.  Psychiatric: Does not appear depressed or anxious  Diabetic Foot Exam - Simple   No data filed   LABS Reviewed Lab Results  Component Value Date   HGBA1C 12.0 (A) 02/12/2024   HGBA1C 12.0 (A) 11/12/2023   HGBA1C 14.5 (A) 07/12/2023   Lab Results  Component Value Date   FRUCTOSAMINE 307 (H) 04/14/2021   FRUCTOSAMINE 314 (H) 09/30/2020   FRUCTOSAMINE 408 (H) 04/09/2020   Lab Results  Component Value Date   CHOL 172 04/24/2023   HDL 42.10 04/24/2023   LDLCALC 85 07/11/2021   LDLDIRECT 102.0 04/24/2023   TRIG 250.0 (H) 04/24/2023   CHOLHDL 4 04/24/2023   Lab Results  Component Value  Date   MICRALBCREAT 3.8 10/02/2022   MICRALBCREAT 2.3 02/03/2021   Lab Results  Component Value Date   CREATININE 0.65 01/28/2024   Lab Results  Component Value Date   GFR 101.68 04/24/2023    ASSESSMENT / PLAN  1. Uncontrolled type 2 diabetes mellitus with hyperglycemia, with long-term current use of insulin (HCC)   2. Hematuria, unspecified type     Diabetes Mellitus type 2 poorly controlled.  Diabetic retinopathy. - Diabetic status / severity: Uncontrolled.  Lab Results  Component Value Date   HGBA1C 12.0 (A) 02/12/2024    - Hemoglobin A1c goal : <7%  Discussed about diabetes mellitus and potential chronic complications.  Discussed about importance of controlling blood sugar to prevent chronic complications.  Discussed about compliance with diabetic medication including insulin.  Discussed again today.  Concerned about compliance with insulin and adjustment of insulin dose based on meals and blood sugar.  She was not able to tolerate multiple GLP-1 receptor agonist in the past.  - Medications: See below  Continue [place] 10  mg daily. Increase Humulin U500 : 60 units with breakfast,30 units with lunch and 60 units with supper and 30 units at bedtime.  She mostly skip insulin at lunchtime.  Start glimepiride 4 mg daily.  - Home glucose testing: Before meals and bedtime 4 times a day.  Offered continuous glucose monitoring, she does not want to use CGM.  Sent new prescription for glucometer and test supplies.  - Discussed/ Gave Hypoglycemia treatment plan.  # Consult : not required at this time.   # Annual urine for microalbuminuria/ creatinine ratio, no microalbuminuria currently, continue ACE/ARB , not on ACE/ARB at this time.  Will check urine microalbumin creatinine ratio today. Last  Lab Results  Component Value Date   MICRALBCREAT 3.8 10/02/2022    # Foot check nightly.  # She has ? diabetic retinopathy.  Advised for regular follow-up with  ophthalmology.  - Diet: Make healthy diabetic food choices - Life style / activity / exercise: Discussed about regular exercise.  2. Blood pressure  -  BP Readings from Last 1 Encounters:  02/12/24 116/70    - Control is in target.  - No change in current plans.  3. Lipid status / Hyperlipidemia - Last  Lab Results  Component Value Date   LDLCALC 85 07/11/2021   - Continue atorvastatin 40 mg daily.  Managed by primary care provider.  # Hematuria -Patient reports gross hematuria randomly, last was 2 weeks ago.  She has history of hysterectomy.  Discussed about discussing with primary care provider when it happens. -Will check urinalysis today, if positive will route results to primary care provider.  Diagnoses and all orders for this visit:  Uncontrolled type 2 diabetes mellitus with hyperglycemia, with long-term current use of insulin (HCC) -     POCT glycosylated hemoglobin (Hb A1C) -     Microalbumin / creatinine urine ratio -     glimepiride (AMARYL) 4 MG tablet; Take 1 tablet (4 mg total) by mouth daily before breakfast. -     Blood Glucose Monitoring Suppl DEVI; 1 each by Does not apply route in the morning, at noon, and at bedtime. May substitute to any manufacturer covered by patient's insurance. -     Glucose Blood (BLOOD GLUCOSE TEST STRIPS) STRP; 1 each by In Vitro route in the morning, at noon, and at bedtime. May substitute to any manufacturer covered by patient's insurance. -     Lancet Device MISC; 1 each by Does not apply route in the morning, at noon, and at bedtime. May substitute to any manufacturer covered by patient's insurance. -     Lancets Misc. MISC; 1 each by Does not apply route in the morning, at noon, and at bedtime. May substitute to any manufacturer covered by patient's insurance.  Hematuria, unspecified type -     Urinalysis w microscopic + reflex cultur     DISPOSITION Follow up in clinic in 3  months suggested.   All questions answered and  patient verbalized understanding of the plan.  Holly Kahli Fitzgerald, MD [facility] Endocrinology [facility] [address] [place], [address] [place], [place] [address] Phone # [phone]  At least part of this note was generated using voice recognition software. Inadvertent word errors may have occurred, which were not recognized during the proofreading process.

## 2024-02-12 NOTE — Patient Instructions (Signed)
 Farxiga 10 mg daily.  Humulin U500 :60 units with breakfast,30 units with lunch and 60 units with supper and 30 for bedtime.  Start glimepiride 4 mg daily.   Bring glucose log in follow up.

## 2024-02-13 ENCOUNTER — Encounter (HOSPITAL_COMMUNITY): Payer: Self-pay

## 2024-02-13 ENCOUNTER — Ambulatory Visit (HOSPITAL_COMMUNITY): Admission: EM | Admit: 2024-02-13 | Discharge: 2024-02-13 | Disposition: A | Discharge status: Home / Self Care

## 2024-02-13 ENCOUNTER — Telehealth: Payer: Self-pay

## 2024-02-13 DIAGNOSIS — N3001 Acute cystitis with hematuria: Secondary | ICD-10-CM | POA: Diagnosis present

## 2024-02-13 LAB — POCT URINALYSIS DIP (MANUAL ENTRY)
Bilirubin, UA: NEGATIVE
Glucose, UA: >=1000 mg/dL — AB
Ketones, POC UA: NEGATIVE mg/dL
Nitrite, UA: NEGATIVE
Spec Grav, UA: 1.015
Urobilinogen, UA: 0.2 U/dL
pH, UA: 6

## 2024-02-13 MED ORDER — NITROFURANTOIN MONOHYD MACRO 100 MG PO CAPS
100.0000 mg | ORAL_CAPSULE | Freq: Two times a day (BID) | ORAL | 0 refills | Status: DC
Start: 2024-02-13 — End: 2024-02-25

## 2024-02-13 MED ORDER — PHENAZOPYRIDINE HCL 200 MG PO TABS
200.0000 mg | ORAL_TABLET | Freq: Three times a day (TID) | ORAL | 0 refills | Status: AC
Start: 2024-02-13 — End: ?

## 2024-02-13 NOTE — Discharge Instructions (Addendum)
  1. Acute cystitis with hematuria (Primary) - POC urinalysis dipstick completed in UC showing small leukocytes, large blood, no nitrite, these findings are strongly indicative of urinary tract infection - nitrofurantoin, macrocrystal-monohydrate, (MACROBID) 100 MG capsule; Take 1 capsule (100 mg total) by mouth 2 (two) times daily.   - phenazopyridine (PYRIDIUM) 200 MG tablet; Take 1 tablet (200 mg total) by mouth 3 (three) times daily.  - Urine Culture sent to lab for further testing results should be available in 24 to 48 hours -Continue to monitor symptoms for any change in severity if there is any escalation of symptoms or flank pain, severe abdominal pain, or high fever that is intractable follow-up in ER for further evaluation and management.

## 2024-02-13 NOTE — Telephone Encounter (Signed)
 Patient given results and medication changes as directed by MD.

## 2024-02-13 NOTE — Telephone Encounter (Signed)
-----   Message from [place] Thapa sent at 02/13/2024 10:19 AM EDT ----- Please notify patient of, preferably call the patient.  Recommend patient to stop Farxiga at this time due to UTI.  Urinalysis test is positive for UTI.  Ask patient to call primary care provider for the treatment of the UTI.  I have also forwarded results to her primary care provider.  Urine culture is awaited at this time.  Hi Dr. Doreene Burke, I have forwarded urinalysis test result to you for the management.  Refer to my office note from March 11 for the details.

## 2024-02-13 NOTE — ED Provider Notes (Signed)
 [facility]  Note:  This document was prepared using Dragon voice recognition software and may include unintentional dictation errors.  MRN: [id number] DOB: [birth year]  Subjective:   Holly Ortiz is a 58 y.o. female presenting for mild dysuria symptoms, increased urinary frequency x 2 days.  Patient reports that she was seen by her endocrinologist yesterday urinalysis was performed in clinic which showed possible UTI.  Endocrinologist contacted her today and advised her to follow-up with primary care provider for treatment.  Patient was unable to get in with her primary care this week so came to urgent care for evaluation and prescription for treatment.  Patient denies any significant abdominal pain, no diarrhea, no shortness of breath, chest pain, weakness, dizziness.   Current Facility-Administered Medications:    0.9 %  sodium chloride infusion, 500 mL, Intravenous, Once, Jenel Lucks, MD  Current Outpatient Medications:    nitrofurantoin, macrocrystal-monohydrate, (MACROBID) 100 MG capsule, Take 1 capsule (100 mg total) by mouth 2 (two) times daily., Disp: 10 capsule, Rfl: 0   phenazopyridine (PYRIDIUM) 200 MG tablet, Take 1 tablet (200 mg total) by mouth 3 (three) times daily., Disp: 6 tablet, Rfl: 0   acetaminophen (TYLENOL) 500 MG tablet, Take 2 tablets (1,000 mg total) by mouth every 6 (six) hours as needed., Disp: 60 tablet, Rfl: 0   amLODipine (NORVASC) 10 MG tablet, Take 1 tablet (10 mg total) by mouth daily., Disp: 90 tablet, Rfl: 3   atorvastatin (LIPITOR) 40 MG tablet, Take 1 tablet (40 mg total) by mouth daily., Disp: 90 tablet, Rfl: 2   Blood Glucose Monitoring Suppl DEVI, 1 each by Does not apply route in the morning, at noon, and at bedtime. May substitute to any manufacturer covered by patient's insurance., Disp: 1 each, Rfl: 0   Cholecalciferol (VITAMIN D) 50 MCG (2000 UT) CAPS, Take 2,000 Units by mouth daily. , Disp: , Rfl:    cyclobenzaprine  (FLEXERIL) 5 MG tablet, TAKE 1 TABLET BY MOUTH AT BEDTIME AS NEEDED FOR MUSCLE SPASMS., Disp: 30 tablet, Rfl: 2   dapagliflozin propanediol (FARXIGA) 10 MG TABS tablet, Take 1 tablet (10 mg total) by mouth daily., Disp: 90 tablet, Rfl: 3   escitalopram (LEXAPRO) 20 MG tablet, Take 1 tablet (20 mg total) by mouth daily., Disp: 90 tablet, Rfl: 3   fenofibrate (TRICOR) 145 MG tablet, Take 1 tablet (145 mg total) by mouth daily., Disp: 90 tablet, Rfl: 1   glimepiride (AMARYL) 4 MG tablet, Take 1 tablet (4 mg total) by mouth daily before breakfast., Disp: 90 tablet, Rfl: 3   Glucose Blood (BLOOD GLUCOSE TEST STRIPS) STRP, 1 each by In Vitro route in the morning, at noon, and at bedtime. May substitute to any manufacturer covered by patient's insurance., Disp: 100 each, Rfl: 3   hydroxychloroquine (PLAQUENIL) 200 MG tablet, TAKE 2 TABLETS BY MOUTH EVERY DAY, Disp: 60 tablet, Rfl: 0   Insulin Pen Needle (BD PEN NEEDLE NANO 2ND GEN) 32G X 4 MM MISC, Use four times daily to inject insulin., Disp: 200 each, Rfl: 11   insulin regular human CONCENTRATED (HUMULIN R U-500 KWIKPEN) 500 UNIT/ML KwikPen, INJECT 40 UNITS UNDER THE SKIN BEFORE BREAKFAST, 20-25 UNITS BEFORE LUNCH AND 40 UNITS AT SUPPER., Disp: 18 mL, Rfl: 6   Lancet Device MISC, 1 each by Does not apply route in the morning, at noon, and at bedtime. May substitute to any manufacturer covered by patient's insurance., Disp: 1 each, Rfl: 0   Lancets Misc. MISC, 1 each by  Does not apply route in the morning, at noon, and at bedtime. May substitute to any manufacturer covered by patient's insurance., Disp: 100 each, Rfl: 3   Melatonin 5 MG TABS, Take 5 mg by mouth at bedtime as needed., Disp: , Rfl:    MIEBO 1.338 GM/ML SOLN, Apply 1 drop to eye 4 (four) times daily., Disp: , Rfl:    montelukast (SINGULAIR) 10 MG tablet, TAKE 1 TABLET BY MOUTH EVERYDAY AT BEDTIME, Disp: 90 tablet, Rfl: 1   polyethylene glycol (MIRALAX / GLYCOLAX) 17 g packet, Take 17 g by  mouth daily. (Patient not taking: Reported on 01/28/2024), Disp: , Rfl:    triamcinolone ointment (KENALOG) 0.5 %, Apply 1 Application topically 2 (two) times daily as needed., Disp: 30 g, Rfl: 1   triamterene-hydrochlorothiazide (MAXZIDE-25) 37.5-25 MG tablet, TAKE 1 TABLET BY MOUTH EVERY DAY (Patient not taking: Reported on 01/28/2024), Disp: 90 tablet, Rfl: 1   vitamin B-12 (CYANOCOBALAMIN) 1000 MCG tablet, Take 1,000 mcg by mouth daily., Disp: , Rfl:    zinc gluconate 50 MG tablet, Take 50 mg by mouth daily. , Disp: , Rfl:    Allergies  Allergen Reactions   Ozempic (0.25 Or 0.5 Mg-Dose) [Semaglutide(0.25 Or 0.5mg -Dos)] Nausea And Vomiting   Actos [Pioglitazone Hydrochloride] Other (See Comments)    Wt gain   Lisinopril     Angioedema, including likely GI involvement   Metformin And Related Diarrhea and Nausea And Vomiting    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-I)    Anxiety    on meds   Asthma    childhood   Cataract 2015   bilateral   Constipation    takes Miralax daily   Depression    on meds   Diabetes mellitus    type II-on meds   Hyperlipidemia    on meds   Hypertension    on meds   Lupus 2022   dx 02/2021   Seasonal allergies    Urticaria, chronic    UTI (lower urinary tract infection)      Past Surgical History:  Procedure Laterality Date   ABDOMINAL HYSTERECTOMY  08/07/2019   BREAST ENHANCEMENT SURGERY  1993   breast lift/reduction   CATARACT EXTRACTION, BILATERAL Bilateral 2014   CATARACT EXTRACTION, BILATERAL Bilateral 04/2022   COLONOSCOPY  2014   in [place]   LAPAROSCOPIC VAGINAL HYSTERECTOMY WITH SALPINGO OOPHORECTOMY Bilateral 08/07/2019   Procedure: LAPAROSCOPIC ASSISTED VAGINAL HYSTERECTOMY WITH SALPINGO OOPHORECTOMY, possible abdominal hysterectomy;  Surgeon: Harold Hedge, MD;  Location: [facility] OR;  Service: Gynecology;  Laterality: Bilateral;  possible abdominal hysterectomy pt is  diabetic and hypertensive   SOFT TISSUE MASS EXCISION Right 2016   [place]   TUBAL LIGATION  1995   tummy tuck  1993    Family History  Problem Relation Age of Onset   Colon polyps Mother 69   Colon cancer Mother 43   Hypertension Mother    Diabetes Father    Hypertension Father    COPD Father        Cause of death   Hypertension Sister    Diabetes Sister    Cancer Sister        Brain glioblastoma   Diabetes Sister    Hypertension Brother    Hyperlipidemia Other    Heart disease Other    Esophageal cancer Neg Hx    Stomach cancer Neg Hx    Rectal cancer Neg Hx  Social History   Tobacco Use   Smoking status: Never    Passive exposure: Never   Smokeless tobacco: Never  Vaping Use   Vaping status: Never Used  Substance Use Topics   Alcohol use: No   Drug use: No    ROS Refer to HPI for ROS details.  Objective:   Vitals: BP (!) 142/62 (BP Location: Left Arm)   Pulse 100   Temp 98.2 F (36.8 C) (Oral)   Resp 18   LMP 05/13/2019 Comment: patient had not had her period in a year and it came back in May and June 2020  SpO2 95%   Physical Exam Vitals and nursing note reviewed.  Constitutional:      General: She is not in acute distress.    Appearance: Normal appearance. She is well-developed. She is not ill-appearing or toxic-appearing.  HENT:     Head: Normocephalic.  Cardiovascular:     Rate and Rhythm: Normal rate.  Pulmonary:     Effort: Pulmonary effort is normal. No respiratory distress.  Abdominal:     Palpations: Abdomen is soft.     Tenderness: There is abdominal tenderness. There is no right CVA tenderness, left CVA tenderness, guarding or rebound.     Comments: Mild suprapubic abdominal pressure on palpation.  Skin:    General: Skin is warm and dry.  Neurological:     General: No focal deficit present.     Mental Status: She is alert and oriented to person, place, and time.  Psychiatric:        Mood and Affect: Mood normal.      Procedures  Results for orders placed or performed during the hospital encounter of 02/13/24 (from the past 24 hours)  POC urinalysis dipstick     Status: Abnormal   Collection Time: 02/13/24  7:44 PM  Result Value Ref Range   Color, UA yellow    Clarity, UA clear    Glucose, UA >=1,000 (A) mg/dL   Bilirubin, UA negative    Ketones, POC UA negative mg/dL   Spec Grav, UA 1.610    Blood, UA large (A)    pH, UA 6.0    Protein Ur, POC trace (A) mg/dL   Urobilinogen, UA 0.2 E.U./dL   Nitrite, UA Negative    Leukocytes, UA Small (1+) (A)     Assessment and Plan :   PDMP not reviewed this encounter.  1. Acute cystitis with hematuria    1. Acute cystitis with hematuria (Primary) - POC urinalysis dipstick completed in UC showing small leukocytes, large blood, no nitrite, these findings are strongly indicative of urinary tract infection - nitrofurantoin, macrocrystal-monohydrate, (MACROBID) 100 MG capsule; Take 1 capsule (100 mg total) by mouth 2 (two) times daily.   - phenazopyridine (PYRIDIUM) 200 MG tablet; Take 1 tablet (200 mg total) by mouth 3 (three) times daily.  - Urine Culture sent to lab for further testing results should be available in 24 to 48 hours -Continue to monitor symptoms for any change in severity if there is any escalation of symptoms or flank pain, severe abdominal pain, or high fever that is intractable follow-up in ER for further evaluation and management.  Lucky Cowboy   [place], [place], [place] 02/13/24 1954

## 2024-02-13 NOTE — ED Triage Notes (Signed)
 Pt states saw her endocrinologist yesterday and dx with UTI. States told to see PCP for treatment.

## 2024-02-15 LAB — MICROALBUMIN / CREATININE URINE RATIO
Creatinine, Urine: 30 mg/dL (ref 20–275)
Microalb Creat Ratio: 67 mg/g{creat} — ABNORMAL HIGH (ref <30)
Microalb, Ur: 2 mg/dL

## 2024-02-15 LAB — URINALYSIS W MICROSCOPIC + REFLEX CULTURE
Bilirubin Urine: NEGATIVE
Hyaline Cast: NONE SEEN /LPF
Ketones, ur: NEGATIVE
Nitrites, Initial: POSITIVE — AB
RBC / HPF: NONE SEEN /HPF (ref 0–2)
Specific Gravity, Urine: 1.031 (ref 1.001–1.035)
Squamous Epithelial / HPF: NONE SEEN /HPF (ref <=5)
WBC, UA: >=60 /HPF — AB (ref 0–5)
pH: 6.5 (ref 5.0–8.0)

## 2024-02-15 LAB — URINE CULTURE
Culture: 80000 — AB
MICRO NUMBER:: 16186302
SPECIMEN QUALITY:: ADEQUATE
Special Requests: NORMAL

## 2024-02-15 LAB — CULTURE INDICATED

## 2024-02-16 ENCOUNTER — Other Ambulatory Visit: Payer: Self-pay | Admitting: Internal Medicine

## 2024-02-16 DIAGNOSIS — M359 Systemic involvement of connective tissue, unspecified: Secondary | ICD-10-CM

## 2024-02-18 NOTE — Telephone Encounter (Signed)
 Last Fill: 01/15/2024  Eye exam: 10/12/2022 WNL   Labs: 01/28/2024 Glucose 221 MCH 25.5 MCHC 30.9  Next Visit: 05/01/2024  Last Visit: 01/28/2024  FI:[id number] connective tissue disease (HCC)   Current Dose per office note 01/28/2024: hydroxychloroquine 400 mg daily   Attempted to contact the patient and left a message to call the office back about her PLQ eye exam.   Okay to refill Plaquenil?

## 2024-02-21 ENCOUNTER — Encounter: Payer: Self-pay | Admitting: Family Medicine

## 2024-02-21 ENCOUNTER — Ambulatory Visit: Admitting: Family Medicine

## 2024-02-21 VITALS — BP 126/66 | HR 95 | Temp 97.0°F | Ht 63.0 in | Wt 213.2 lb

## 2024-02-21 DIAGNOSIS — R1084 Generalized abdominal pain: Secondary | ICD-10-CM | POA: Diagnosis not present

## 2024-02-21 DIAGNOSIS — R3121 Asymptomatic microscopic hematuria: Secondary | ICD-10-CM | POA: Diagnosis not present

## 2024-02-21 LAB — POCT URINALYSIS DIPSTICK
Bilirubin, UA: NEGATIVE
Blood, UA: 10
Glucose, UA: NEGATIVE
Ketones, UA: NEGATIVE
Nitrite, UA: NEGATIVE
Protein, UA: POSITIVE — AB
Spec Grav, UA: 1.01 (ref 1.010–1.025)
Urobilinogen, UA: 0.2 U/dL
pH, UA: 6 (ref 5.0–8.0)

## 2024-02-21 LAB — COMPREHENSIVE METABOLIC PANEL
ALT: 13 U/L (ref 0–35)
AST: 10 U/L (ref 0–37)
Albumin: 4.1 g/dL (ref 3.5–5.2)
Alkaline Phosphatase: 70 U/L (ref 39–117)
BUN: 28 mg/dL — ABNORMAL HIGH (ref 6–23)
CO2: 30 meq/L (ref 19–32)
Calcium: 9.7 mg/dL (ref 8.4–10.5)
Chloride: 100 meq/L (ref 96–112)
Creatinine, Ser: 0.73 mg/dL (ref 0.40–1.20)
GFR: 91.49 mL/min (ref >60.00)
Glucose, Bld: 136 mg/dL — ABNORMAL HIGH (ref 70–99)
Potassium: 4.1 meq/L (ref 3.5–5.1)
Sodium: 139 meq/L (ref 135–145)
Total Bilirubin: 0.3 mg/dL (ref 0.2–1.2)
Total Protein: 7 g/dL (ref 6.0–8.3)

## 2024-02-21 LAB — CBC WITH DIFFERENTIAL/PLATELET
Basophils Absolute: 0 10*3/uL (ref 0.0–0.1)
Basophils Relative: 0.3 % (ref 0.0–3.0)
Eosinophils Absolute: 0.2 10*3/uL (ref 0.0–0.7)
Eosinophils Relative: 2.4 % (ref 0.0–5.0)
HCT: 36.5 % (ref 36.0–46.0)
Hemoglobin: 11.8 g/dL — ABNORMAL LOW (ref 12.0–15.0)
Lymphocytes Relative: 16.8 % (ref 12.0–46.0)
Lymphs Abs: 1.7 10*3/uL (ref 0.7–4.0)
MCHC: 32.4 g/dL (ref 30.0–36.0)
MCV: 80.6 fl (ref 78.0–100.0)
Monocytes Absolute: 1 10*3/uL (ref 0.1–1.0)
Monocytes Relative: 10 % (ref 3.0–12.0)
Neutro Abs: 7.2 10*3/uL (ref 1.4–7.7)
Neutrophils Relative %: 70.5 % (ref 43.0–77.0)
Platelets: 330 10*3/uL (ref 150.0–400.0)
RBC: 4.53 Mil/uL (ref 3.87–5.11)
RDW: 14.4 % (ref 11.5–15.5)
WBC: 10.3 10*3/uL (ref 4.0–10.5)

## 2024-02-21 MED ORDER — SULFAMETHOXAZOLE-TRIMETHOPRIM 800-160 MG PO TABS: 1.0000 | ORAL_TABLET | Freq: Two times a day (BID) | ORAL | 0 refills | Status: DC

## 2024-02-21 NOTE — Progress Notes (Signed)
 Established Patient Office Visit   Subjective:  Patient ID: Holly Ortiz, female    DOB: 15-May-1966  Age: 58 y.o. MRN: [id number]  Chief Complaint  Patient presents with   Hematuria    Pt complains of blood in her urine x 2 months. Pt was seen at [facility] that gave her antibiotic and azo. Pt took her last antibiotic pill on this past month.  Pt complains of abdominal discomfort and back pain. Flank pain. Headache and nausea Pt has Lupus.     Hematuria Irritative symptoms do not include frequency. Associated symptoms include nausea. Pertinent negatives include no abdominal pain, chills, dysuria, fever or vomiting.   Encounter Diagnoses  Name Primary?   Asymptomatic microscopic hematuria Yes   Generalized abdominal pain    For follow-up of a hemorrhagic cystitis treated with Macrobid.  Urine culture grew out 80,000 colony-forming units of E. coli with broad sensitivity.  She completed her course of therapy.  She has had no further dysuria.  She reports abdominal bloating and generalized tenderness.  There is some tenderness in her flank and lower back.  She has felt nausea but there has been no vomiting fever or chills.  History of constipation alternating with diarrhea.  Although she is seeing no blood in your urine.  POCT urine dip was positive for blood and leukocyte esterase was 2+ he has nausea.  Complete hysterectomy in 2020 for menorrhagia.  Normal colonoscopy in 2022.   Review of Systems  Constitutional: Negative.  Negative for chills and fever.  HENT: Negative.    Eyes:  Negative for blurred vision, discharge and redness.  Respiratory: Negative.    Cardiovascular: Negative.   Gastrointestinal:  Positive for nausea. Negative for abdominal pain and vomiting.  Genitourinary:  Negative for dysuria, frequency and hematuria.  Musculoskeletal: Negative.  Negative for myalgias.  Skin:  Negative for rash.  Neurological:  Negative for tingling, loss of consciousness and  weakness.  Endo/Heme/Allergies:  Negative for polydipsia.     Current Outpatient Medications:    acetaminophen (TYLENOL) 500 MG tablet, Take 2 tablets (1,000 mg total) by mouth every 6 (six) hours as needed., Disp: 60 tablet, Rfl: 0   amLODipine (NORVASC) 10 MG tablet, Take 1 tablet (10 mg total) by mouth daily., Disp: 90 tablet, Rfl: 3   atorvastatin (LIPITOR) 40 MG tablet, Take 1 tablet (40 mg total) by mouth daily., Disp: 90 tablet, Rfl: 2   Blood Glucose Monitoring Suppl DEVI, 1 each by Does not apply route in the morning, at noon, and at bedtime. May substitute to any manufacturer covered by patient's insurance., Disp: 1 each, Rfl: 0   Cholecalciferol (VITAMIN D) 50 MCG (2000 UT) CAPS, Take 2,000 Units by mouth daily. , Disp: , Rfl:    cyclobenzaprine (FLEXERIL) 5 MG tablet, TAKE 1 TABLET BY MOUTH AT BEDTIME AS NEEDED FOR MUSCLE SPASMS., Disp: 30 tablet, Rfl: 2   escitalopram (LEXAPRO) 20 MG tablet, Take 1 tablet (20 mg total) by mouth daily., Disp: 90 tablet, Rfl: 3   fenofibrate (TRICOR) 145 MG tablet, Take 1 tablet (145 mg total) by mouth daily., Disp: 90 tablet, Rfl: 1   glimepiride (AMARYL) 4 MG tablet, Take 1 tablet (4 mg total) by mouth daily before breakfast., Disp: 90 tablet, Rfl: 3   Glucose Blood (BLOOD GLUCOSE TEST STRIPS) STRP, 1 each by In Vitro route in the morning, at noon, and at bedtime. May substitute to any manufacturer covered by patient's insurance., Disp: 100 each, Rfl: 3  hydroxychloroquine (PLAQUENIL) 200 MG tablet, TAKE 2 TABLETS BY MOUTH EVERY DAY, Disp: 60 tablet, Rfl: 1   Insulin Pen Needle (BD PEN NEEDLE NANO 2ND GEN) 32G X 4 MM MISC, Use four times daily to inject insulin., Disp: 200 each, Rfl: 11   insulin regular human CONCENTRATED (HUMULIN R U-500 KWIKPEN) 500 UNIT/ML KwikPen, INJECT 40 UNITS UNDER THE SKIN BEFORE BREAKFAST, 20-25 UNITS BEFORE LUNCH AND 40 UNITS AT SUPPER., Disp: 18 mL, Rfl: 6   Lancet Device MISC, 1 each by Does not apply route in the  morning, at noon, and at bedtime. May substitute to any manufacturer covered by patient's insurance., Disp: 1 each, Rfl: 0   Lancets Misc. MISC, 1 each by Does not apply route in the morning, at noon, and at bedtime. May substitute to any manufacturer covered by patient's insurance., Disp: 100 each, Rfl: 3   Melatonin 5 MG TABS, Take 5 mg by mouth at bedtime as needed., Disp: , Rfl:    MIEBO 1.338 GM/ML SOLN, Apply 1 drop to eye 4 (four) times daily., Disp: , Rfl:    montelukast (SINGULAIR) 10 MG tablet, TAKE 1 TABLET BY MOUTH EVERYDAY AT BEDTIME, Disp: 90 tablet, Rfl: 1   phenazopyridine (PYRIDIUM) 200 MG tablet, Take 1 tablet (200 mg total) by mouth 3 (three) times daily., Disp: 6 tablet, Rfl: 0   polyethylene glycol (MIRALAX / GLYCOLAX) 17 g packet, Take 17 g by mouth daily., Disp: , Rfl:    sulfamethoxazole-trimethoprim (BACTRIM DS) 800-160 MG tablet, Take 1 tablet by mouth 2 (two) times daily for 7 days., Disp: 14 tablet, Rfl: 0   triamcinolone ointment (KENALOG) 0.5 %, Apply 1 Application topically 2 (two) times daily as needed., Disp: 30 g, Rfl: 1   triamterene-hydrochlorothiazide (MAXZIDE-25) 37.5-25 MG tablet, TAKE 1 TABLET BY MOUTH EVERY DAY, Disp: 90 tablet, Rfl: 1   vitamin B-12 (CYANOCOBALAMIN) 1000 MCG tablet, Take 1,000 mcg by mouth daily., Disp: , Rfl:    zinc gluconate 50 MG tablet, Take 50 mg by mouth daily. , Disp: , Rfl:    dapagliflozin propanediol (FARXIGA) 10 MG TABS tablet, Take 1 tablet (10 mg total) by mouth daily. (Patient not taking: Reported on 02/21/2024), Disp: 90 tablet, Rfl: 3   nitrofurantoin, macrocrystal-monohydrate, (MACROBID) 100 MG capsule, Take 1 capsule (100 mg total) by mouth 2 (two) times daily. (Patient not taking: Reported on 02/21/2024), Disp: 10 capsule, Rfl: 0  Current Facility-Administered Medications:    0.9 %  sodium chloride infusion, 500 mL, Intravenous, Once, Cunningham, Scott E, MD   Objective:     BP 126/66   Pulse 95   Temp (!) 97 F (36.1  C)   Ht 5\' 3"  (1.6 m)   Wt 213 lb 3.2 oz (96.7 kg)   LMP 05/13/2019 Comment: patient had not had her period in a year and it came back in May and June 2020  SpO2 97%   BMI 37.77 kg/m    Physical Exam Constitutional:      General: She is not in acute distress.    Appearance: Normal appearance. She is not ill-appearing, toxic-appearing or diaphoretic.  HENT:     Head: Normocephalic and atraumatic.     Right Ear: External ear normal.     Left Ear: External ear normal.     Mouth/Throat:     Mouth: Mucous membranes are moist.     Pharynx: Oropharynx is clear. No oropharyngeal exudate or posterior oropharyngeal erythema.  Eyes:     General: No scleral  icterus.       Right eye: No discharge.        Left eye: No discharge.     Extraocular Movements: Extraocular movements intact.     Conjunctiva/sclera: Conjunctivae normal.     Pupils: Pupils are equal, round, and reactive to light.  Cardiovascular:     Rate and Rhythm: Normal rate and regular rhythm.  Pulmonary:     Effort: Pulmonary effort is normal. No respiratory distress.     Breath sounds: Normal breath sounds. No wheezing or rales.  Abdominal:     General: Bowel sounds are normal.     Tenderness: There is abdominal tenderness (mild). There is right CVA tenderness (mild ttp) and left CVA tenderness (mild ttp). There is no guarding.  Musculoskeletal:     Cervical back: No rigidity or tenderness.  Skin:    General: Skin is warm and dry.  Neurological:     Mental Status: She is alert and oriented to person, place, and time.  Psychiatric:        Mood and Affect: Mood normal.        Behavior: Behavior normal.      No results found for any visits on 02/21/24.    The 10-year ASCVD risk score (Arnett DK, et al., 2019) is: 6.5%    Assessment & Plan:   Asymptomatic microscopic hematuria -     POCT urinalysis dipstick -     Urine Culture -     Sulfamethoxazole-Trimethoprim; Take 1 tablet by mouth 2 (two) times daily for  7 days.  Dispense: 14 tablet; Refill: 0  Generalized abdominal pain -     CBC with Differential/Platelet -     Comprehensive metabolic panel    Return Please proceed to emergency room with increasing signs and symptoms..  Rechecking urine culture.  Will retreat with Septra for bactericidal properties.   Mliss Sax, MD

## 2024-02-22 LAB — URINE CULTURE
MICRO NUMBER:: 16226714
SPECIMEN QUALITY:: ADEQUATE

## 2024-02-24 ENCOUNTER — Emergency Department (HOSPITAL_COMMUNITY)

## 2024-02-24 ENCOUNTER — Observation Stay (HOSPITAL_COMMUNITY)
Admission: EM | Admit: 2024-02-24 | Discharge: 2024-02-25 | Disposition: A | Discharge status: Home / Self Care | Attending: Internal Medicine | Admitting: Internal Medicine

## 2024-02-24 ENCOUNTER — Other Ambulatory Visit: Payer: Self-pay

## 2024-02-24 ENCOUNTER — Encounter (HOSPITAL_COMMUNITY): Payer: Self-pay

## 2024-02-24 DIAGNOSIS — M3210 Systemic lupus erythematosus, organ or system involvement unspecified: Secondary | ICD-10-CM | POA: Diagnosis not present

## 2024-02-24 DIAGNOSIS — N200 Calculus of kidney: Secondary | ICD-10-CM | POA: Insufficient documentation

## 2024-02-24 DIAGNOSIS — A4189 Other specified sepsis: Secondary | ICD-10-CM | POA: Insufficient documentation

## 2024-02-24 DIAGNOSIS — F341 Dysthymic disorder: Secondary | ICD-10-CM | POA: Diagnosis not present

## 2024-02-24 DIAGNOSIS — R0902 Hypoxemia: Secondary | ICD-10-CM

## 2024-02-24 DIAGNOSIS — E119 Type 2 diabetes mellitus without complications: Secondary | ICD-10-CM | POA: Diagnosis not present

## 2024-02-24 DIAGNOSIS — I10 Essential (primary) hypertension: Secondary | ICD-10-CM | POA: Diagnosis not present

## 2024-02-24 DIAGNOSIS — U071 COVID-19: Principal | ICD-10-CM | POA: Insufficient documentation

## 2024-02-24 DIAGNOSIS — E785 Hyperlipidemia, unspecified: Secondary | ICD-10-CM | POA: Insufficient documentation

## 2024-02-24 DIAGNOSIS — J9601 Acute respiratory failure with hypoxia: Principal | ICD-10-CM | POA: Insufficient documentation

## 2024-02-24 DIAGNOSIS — R509 Fever, unspecified: Secondary | ICD-10-CM | POA: Diagnosis present

## 2024-02-24 LAB — URINALYSIS, ROUTINE W REFLEX MICROSCOPIC
Bilirubin Urine: NEGATIVE
Glucose, UA: NEGATIVE mg/dL
Hgb urine dipstick: NEGATIVE
Ketones, ur: NEGATIVE mg/dL
Nitrite: NEGATIVE
Protein, ur: NEGATIVE mg/dL
Specific Gravity, Urine: 1.012 (ref 1.005–1.030)
pH: 5 (ref 5.0–8.0)

## 2024-02-24 LAB — CBC WITH DIFFERENTIAL/PLATELET
Abs Immature Granulocytes: 0.04 10*3/uL (ref 0.00–0.07)
Basophils Absolute: 0 10*3/uL (ref 0.0–0.1)
Basophils Relative: 0 %
Eosinophils Absolute: 0.1 10*3/uL (ref 0.0–0.5)
Eosinophils Relative: 2 %
HCT: 35.4 % — ABNORMAL LOW (ref 36.0–46.0)
Hemoglobin: 11 g/dL — ABNORMAL LOW (ref 12.0–15.0)
Immature Granulocytes: 1 %
Lymphocytes Relative: 8 %
Lymphs Abs: 0.5 10*3/uL — ABNORMAL LOW (ref 0.7–4.0)
MCH: 26.1 pg (ref 26.0–34.0)
MCHC: 31.1 g/dL (ref 30.0–36.0)
MCV: 84.1 fL (ref 80.0–100.0)
Monocytes Absolute: 0.7 10*3/uL (ref 0.1–1.0)
Monocytes Relative: 11 %
Neutro Abs: 5 10*3/uL (ref 1.7–7.7)
Neutrophils Relative %: 78 %
Platelets: 243 10*3/uL (ref 150–400)
RBC: 4.21 MIL/uL (ref 3.87–5.11)
RDW: 13.8 % (ref 11.5–15.5)
WBC: 6.3 10*3/uL (ref 4.0–10.5)
nRBC: 0 % (ref 0.0–0.2)

## 2024-02-24 LAB — GLUCOSE, CAPILLARY
Glucose-Capillary: 200 mg/dL — ABNORMAL HIGH (ref 70–99)
Glucose-Capillary: 540 mg/dL (ref 70–99)
Glucose-Capillary: 542 mg/dL (ref 70–99)

## 2024-02-24 LAB — COMPREHENSIVE METABOLIC PANEL
ALT: 24 U/L (ref 0–44)
AST: 22 U/L (ref 15–41)
Albumin: 3.5 g/dL (ref 3.5–5.0)
Alkaline Phosphatase: 62 U/L (ref 38–126)
Anion gap: 8 (ref 5–15)
BUN: 29 mg/dL — ABNORMAL HIGH (ref 6–20)
CO2: 28 mmol/L (ref 22–32)
Calcium: 9.3 mg/dL (ref 8.9–10.3)
Chloride: 103 mmol/L (ref 98–111)
Creatinine, Ser: 0.99 mg/dL (ref 0.44–1.00)
GFR, Estimated: >60 mL/min (ref >60)
Glucose, Bld: 143 mg/dL — ABNORMAL HIGH (ref 70–99)
Potassium: 4.4 mmol/L (ref 3.5–5.1)
Sodium: 139 mmol/L (ref 135–145)
Total Bilirubin: 0.3 mg/dL (ref 0.0–1.2)
Total Protein: 7.3 g/dL (ref 6.5–8.1)

## 2024-02-24 LAB — HIV ANTIBODY (ROUTINE TESTING W REFLEX): HIV Screen 4th Generation wRfx: NONREACTIVE

## 2024-02-24 LAB — LACTIC ACID, PLASMA
Lactic Acid, Venous: 0.8 mmol/L (ref 0.5–1.9)
Lactic Acid, Venous: 1.2 mmol/L (ref 0.5–1.9)

## 2024-02-24 LAB — RESP PANEL BY RT-PCR (RSV, FLU A&B, COVID)  RVPGX2
Influenza A by PCR: NEGATIVE
Influenza B by PCR: NEGATIVE
Resp Syncytial Virus by PCR: NEGATIVE
SARS Coronavirus 2 by RT PCR: POSITIVE — AB

## 2024-02-24 LAB — LIPASE, BLOOD: Lipase: 29 U/L (ref 11–51)

## 2024-02-24 MED ORDER — SODIUM CHLORIDE 0.9 % IV SOLN
2.0000 g | Freq: Once | INTRAVENOUS | Status: AC
  Administered 2024-02-24: 2 g via INTRAVENOUS
  Filled 2024-02-24: qty 20

## 2024-02-24 MED ORDER — ATORVASTATIN CALCIUM 40 MG PO TABS
40.0000 mg | ORAL_TABLET | Freq: Every day | ORAL | Status: DC
  Administered 2024-02-25: 40 mg via ORAL
  Filled 2024-02-24: qty 1

## 2024-02-24 MED ORDER — ALBUTEROL SULFATE (2.5 MG/3ML) 0.083% IN NEBU: 2.5000 mg | INHALATION_SOLUTION | RESPIRATORY_TRACT | Status: DC | PRN

## 2024-02-24 MED ORDER — DIPHENHYDRAMINE HCL 50 MG/ML IJ SOLN
25.0000 mg | Freq: Once | INTRAMUSCULAR | Status: AC
  Administered 2024-02-24: 25 mg via INTRAVENOUS
  Filled 2024-02-24: qty 1

## 2024-02-24 MED ORDER — INSULIN REGULAR HUMAN (CONC) 500 UNIT/ML ~~LOC~~ SOPN
30.0000 [IU] | PEN_INJECTOR | Freq: Three times a day (TID) | SUBCUTANEOUS | Status: DC
  Administered 2024-02-25 (×2): 30 [IU] via SUBCUTANEOUS
  Filled 2024-02-24: qty 3

## 2024-02-24 MED ORDER — HYDROXYCHLOROQUINE SULFATE 200 MG PO TABS
400.0000 mg | ORAL_TABLET | Freq: Every day | ORAL | Status: DC
  Administered 2024-02-24 – 2024-02-25 (×2): 400 mg via ORAL
  Filled 2024-02-24 (×2): qty 2

## 2024-02-24 MED ORDER — KETOROLAC TROMETHAMINE 30 MG/ML IJ SOLN
30.0000 mg | Freq: Once | INTRAMUSCULAR | Status: AC
  Administered 2024-02-24: 30 mg via INTRAVENOUS
  Filled 2024-02-24: qty 1

## 2024-02-24 MED ORDER — MONTELUKAST SODIUM 10 MG PO TABS
10.0000 mg | ORAL_TABLET | Freq: Every day | ORAL | Status: DC
  Administered 2024-02-24: 10 mg via ORAL
  Filled 2024-02-24: qty 1

## 2024-02-24 MED ORDER — AMLODIPINE BESYLATE 5 MG PO TABS
10.0000 mg | ORAL_TABLET | Freq: Every day | ORAL | Status: DC
  Administered 2024-02-24 – 2024-02-25 (×2): 10 mg via ORAL
  Filled 2024-02-24 (×2): qty 2

## 2024-02-24 MED ORDER — ENOXAPARIN SODIUM 40 MG/0.4ML IJ SOSY
40.0000 mg | PREFILLED_SYRINGE | INTRAMUSCULAR | Status: DC
  Administered 2024-02-24: 40 mg via SUBCUTANEOUS
  Filled 2024-02-24: qty 0.4

## 2024-02-24 MED ORDER — ONDANSETRON HCL 4 MG PO TABS: 4.0000 mg | ORAL_TABLET | Freq: Four times a day (QID) | ORAL | Status: DC | PRN

## 2024-02-24 MED ORDER — TRIAMTERENE-HCTZ 37.5-25 MG PO TABS
1.0000 | ORAL_TABLET | Freq: Every day | ORAL | Status: DC
  Administered 2024-02-25: 1 via ORAL
  Filled 2024-02-24: qty 1

## 2024-02-24 MED ORDER — SULFAMETHOXAZOLE-TRIMETHOPRIM 800-160 MG PO TABS: 1.0000 | ORAL_TABLET | Freq: Two times a day (BID) | ORAL | Status: DC

## 2024-02-24 MED ORDER — INSULIN ASPART 100 UNIT/ML IJ SOLN
0.0000 [IU] | Freq: Three times a day (TID) | INTRAMUSCULAR | Status: DC
  Administered 2024-02-24: 4 [IU] via SUBCUTANEOUS
  Administered 2024-02-25 (×2): 11 [IU] via SUBCUTANEOUS
  Filled 2024-02-24: qty 0.2

## 2024-02-24 MED ORDER — ONDANSETRON HCL 4 MG/2ML IJ SOLN: 4.0000 mg | Freq: Four times a day (QID) | INTRAMUSCULAR | Status: DC | PRN

## 2024-02-24 MED ORDER — ACETAMINOPHEN 325 MG PO TABS
650.0000 mg | ORAL_TABLET | Freq: Once | ORAL | Status: AC
  Administered 2024-02-24: 650 mg via ORAL
  Filled 2024-02-24: qty 2

## 2024-02-24 MED ORDER — IPRATROPIUM-ALBUTEROL 0.5-2.5 (3) MG/3ML IN SOLN: 3.0000 mL | Freq: Once | RESPIRATORY_TRACT | Status: DC

## 2024-02-24 MED ORDER — INSULIN ASPART 100 UNIT/ML IJ SOLN
0.0000 [IU] | Freq: Every day | INTRAMUSCULAR | Status: DC
  Filled 2024-02-24: qty 0.05

## 2024-02-24 MED ORDER — ACETAMINOPHEN 650 MG RE SUPP: 650.0000 mg | Freq: Four times a day (QID) | RECTAL | Status: DC | PRN

## 2024-02-24 MED ORDER — ONDANSETRON HCL 4 MG/2ML IJ SOLN
4.0000 mg | Freq: Once | INTRAMUSCULAR | Status: AC
  Administered 2024-02-24: 4 mg via INTRAVENOUS
  Filled 2024-02-24: qty 2

## 2024-02-24 MED ORDER — ACETAMINOPHEN 325 MG PO TABS
650.0000 mg | ORAL_TABLET | Freq: Four times a day (QID) | ORAL | Status: DC | PRN
  Administered 2024-02-24: 650 mg via ORAL
  Filled 2024-02-24: qty 2

## 2024-02-24 MED ORDER — ALBUTEROL SULFATE (2.5 MG/3ML) 0.083% IN NEBU: 2.5000 mg | INHALATION_SOLUTION | Freq: Once | RESPIRATORY_TRACT | Status: DC

## 2024-02-24 MED ORDER — TRAZODONE HCL 50 MG PO TABS: 25.0000 mg | ORAL_TABLET | Freq: Every evening | ORAL | Status: DC | PRN

## 2024-02-24 MED ORDER — DEXAMETHASONE SODIUM PHOSPHATE 10 MG/ML IJ SOLN
6.0000 mg | Freq: Every day | INTRAMUSCULAR | Status: DC
  Administered 2024-02-24 – 2024-02-25 (×2): 6 mg via INTRAVENOUS
  Filled 2024-02-24 (×2): qty 1

## 2024-02-24 MED ORDER — FAMOTIDINE IN NACL 20-0.9 MG/50ML-% IV SOLN
20.0000 mg | Freq: Once | INTRAVENOUS | Status: AC
Start: 2024-02-24 — End: 2024-02-24
  Administered 2024-02-24: 20 mg via INTRAVENOUS
  Filled 2024-02-24: qty 50

## 2024-02-24 MED ORDER — CYCLOBENZAPRINE HCL 10 MG PO TABS: 5.0000 mg | ORAL_TABLET | Freq: Every evening | ORAL | Status: DC | PRN

## 2024-02-24 MED ORDER — ESCITALOPRAM OXALATE 10 MG PO TABS
20.0000 mg | ORAL_TABLET | Freq: Every day | ORAL | Status: DC
  Administered 2024-02-25: 20 mg via ORAL
  Filled 2024-02-24: qty 2

## 2024-02-24 MED ORDER — INSULIN ASPART 100 UNIT/ML IJ SOLN
7.0000 [IU] | Freq: Once | INTRAMUSCULAR | Status: AC
  Administered 2024-02-24: 7 [IU] via SUBCUTANEOUS

## 2024-02-24 NOTE — ED Triage Notes (Signed)
 Pt arrived reporting intermittent hematuria and UTI for the past 3 weeks. Has been treated twice for UTI. BLE flank pain. Hx of lupus and feels she may also be having a flare with hives.

## 2024-02-24 NOTE — ED Notes (Addendum)
 Pt de-satted down to 84% on room air upon standing  [place] was applied at 3LPM O2 improved to 92%

## 2024-02-24 NOTE — ED Provider Notes (Signed)
 [place] EMERGENCY DEPARTMENT AT [facility] Provider Note   CSN: [id number] Arrival date & time: 02/24/24  9629     History {Add pertinent medical, surgical, social history, OB history to HPI:1} Chief Complaint  Patient presents with   Hematuria   Nausea   Back Pain    Holly Ortiz is a 58 y.o. female.  Patient presents with fever and chills for a number days.  Patient has had a urinary tract infection.  She also complains of some shortness of breath   Hematuria  Back Pain      Home Medications Prior to Admission medications   Medication Sig Start Date End Date Taking? Authorizing Provider  acetaminophen (TYLENOL) 500 MG tablet Take 2 tablets (1,000 mg total) by mouth every 6 (six) hours as needed. 08/08/19   Harold Hedge, MD  amLODipine (NORVASC) 10 MG tablet Take 1 tablet (10 mg total) by mouth daily. 04/24/23   Mliss Sax, MD  atorvastatin (LIPITOR) 40 MG tablet Take 1 tablet (40 mg total) by mouth daily. 04/24/23   Mliss Sax, MD  Blood Glucose Monitoring Suppl DEVI 1 each by Does not apply route in the morning, at noon, and at bedtime. May substitute to any manufacturer covered by patient's insurance. 02/12/24   Thapa, [place], MD  Cholecalciferol (VITAMIN D) 50 MCG (2000 UT) CAPS Take 2,000 Units by mouth daily.     [provider]  cyclobenzaprine (FLEXERIL) 5 MG tablet TAKE 1 TABLET BY MOUTH AT BEDTIME AS NEEDED FOR MUSCLE SPASMS. 09/03/23   Rice, Jamesetta Orleans, MD  dapagliflozin propanediol (FARXIGA) 10 MG TABS tablet Take 1 tablet (10 mg total) by mouth daily. Patient not taking: Reported on 02/21/2024 07/12/23   Thapa, [place], MD  escitalopram (LEXAPRO) 20 MG tablet Take 1 tablet (20 mg total) by mouth daily. 04/24/23   Mliss Sax, MD  fenofibrate (TRICOR) 145 MG tablet Take 1 tablet (145 mg total) by mouth daily. 02/08/21   Reather Littler, MD  glimepiride (AMARYL) 4 MG tablet Take 1 tablet (4 mg total) by mouth daily  before breakfast. 02/12/24   Thapa, [place], MD  Glucose Blood (BLOOD GLUCOSE TEST STRIPS) STRP 1 each by In Vitro route in the morning, at noon, and at bedtime. May substitute to any manufacturer covered by patient's insurance. 02/12/24 06/24/24  Thapa, [place], MD  hydroxychloroquine (PLAQUENIL) 200 MG tablet TAKE 2 TABLETS BY MOUTH EVERY DAY 02/18/24   Rice, Jamesetta Orleans, MD  Insulin Pen Needle (BD PEN NEEDLE NANO 2ND GEN) 32G X 4 MM MISC Use four times daily to inject insulin. 07/30/19   Reather Littler, MD  insulin regular human CONCENTRATED (HUMULIN R U-500 KWIKPEN) 500 UNIT/ML KwikPen INJECT 40 UNITS UNDER THE SKIN BEFORE BREAKFAST, 20-25 UNITS BEFORE LUNCH AND 40 UNITS AT SUPPER. 07/12/23   Thapa, [place], MD  Lancet Device MISC 1 each by Does not apply route in the morning, at noon, and at bedtime. May substitute to any manufacturer covered by patient's insurance. 02/12/24 03/13/24  Thapa, [place], MD  Lancets Misc. MISC 1 each by Does not apply route in the morning, at noon, and at bedtime. May substitute to any manufacturer covered by patient's insurance. 02/12/24 03/13/24  Thapa, [place], MD  Melatonin 5 MG TABS Take 5 mg by mouth at bedtime as needed.    [provider]  MIEBO 1.338 GM/ML SOLN Apply 1 drop to eye 4 (four) times daily. 11/10/22   [provider]  montelukast (SINGULAIR) 10 MG  tablet TAKE 1 TABLET BY MOUTH EVERYDAY AT BEDTIME 10/17/23   Mliss Sax, MD  nitrofurantoin, macrocrystal-monohydrate, (MACROBID) 100 MG capsule Take 1 capsule (100 mg total) by mouth 2 (two) times daily. Patient not taking: Reported on 02/21/2024 02/13/24   Pola Corn B, NP  phenazopyridine (PYRIDIUM) 200 MG tablet Take 1 tablet (200 mg total) by mouth 3 (three) times daily. 02/13/24   Reddick, Nicola Girt B, NP  polyethylene glycol (MIRALAX / GLYCOLAX) 17 g packet Take 17 g by mouth daily.    [provider]  sulfamethoxazole-trimethoprim (BACTRIM DS) 800-160 MG tablet Take 1 tablet  by mouth 2 (two) times daily for 7 days. 02/21/24 02/28/24  Mliss Sax, MD  triamcinolone ointment (KENALOG) 0.5 % Apply 1 Application topically 2 (two) times daily as needed. 08/07/23   Fuller Plan, MD  triamterene-hydrochlorothiazide (MAXZIDE-25) 37.5-25 MG tablet TAKE 1 TABLET BY MOUTH EVERY DAY 10/17/23   Mliss Sax, MD  vitamin B-12 (CYANOCOBALAMIN) 1000 MCG tablet Take 1,000 mcg by mouth daily.    [provider]  zinc gluconate 50 MG tablet Take 50 mg by mouth daily.     [provider]      Allergies    Ozempic (0.25 or 0.5 mg-dose) [semaglutide(0.25 or 0.5mg -dos)], Actos [pioglitazone hydrochloride], Lisinopril, and Metformin and related    Review of Systems   Review of Systems  Genitourinary:  Positive for hematuria.  Musculoskeletal:  Positive for back pain.    Physical Exam Updated Vital Signs BP (!) 142/48 (BP Location: Left Arm)   Pulse (!) 116   Temp (!) 100.5 F (38.1 C) (Oral)   Resp (!) 27   Ht 5\' 3"  (1.6 m)   Wt 96.6 kg   LMP 05/13/2019 Comment: patient had not had her period in a year and it came back in May and June 2020  SpO2 (!) 86%   BMI 37.73 kg/m  Physical Exam  ED Results / Procedures / Treatments   Labs (all labs ordered are listed, but only abnormal results are displayed) Labs Reviewed  RESP PANEL BY RT-PCR (RSV, FLU A&B, COVID)  [id number] - Abnormal; Notable for the following components:      Result Value   SARS Coronavirus 2 by RT PCR POSITIVE (*)    All other components within normal limits  CBC WITH DIFFERENTIAL/PLATELET - Abnormal; Notable for the following components:   Hemoglobin 11.0 (*)    HCT 35.4 (*)    Lymphs Abs 0.5 (*)    All other components within normal limits  COMPREHENSIVE METABOLIC PANEL - Abnormal; Notable for the following components:   Glucose, Bld 143 (*)    BUN 29 (*)    All other components within normal limits  URINALYSIS, ROUTINE W REFLEX MICROSCOPIC - Abnormal; Notable  for the following components:   Color, Urine STRAW (*)    Leukocytes,Ua MODERATE (*)    Bacteria, UA RARE (*)    All other components within normal limits  URINE CULTURE  LIPASE, BLOOD    EKG EKG Interpretation Date/Time:  Sunday February 24 2024 14:15:54 EDT Ventricular Rate:  114 PR Interval:  165 QRS Duration:  98 QT Interval:  343 QTC Calculation: 473 R Axis:   57  Text Interpretation: Sinus tachycardia Confirmed by Vanetta Mulders [phone]) on 02/24/2024 2:18:25 PM  Radiology DG Chest Port 1 View Result Date: 02/24/2024 CLINICAL DATA:  Shortness of breath EXAM: PORTABLE CHEST 1 VIEW COMPARISON:  Chest radiograph dated 12/23/2018 FINDINGS: Bilateral costophrenic angles  are not included within the field of view. Normal lung volumes. Mild bilateral interstitial and bibasilar patchy opacities. No pneumothorax. Enlarged cardiomediastinal silhouette is likely projectional. No acute osseous abnormality. IMPRESSION: Mild bilateral interstitial and bibasilar patchy opacities, which may represent pulmonary edema or atypical infection. Electronically Signed   By: Agustin Cree M.D.   On: 02/24/2024 15:01   CT Renal Stone Study Result Date: 02/24/2024 CLINICAL DATA:  Intermittent hematuria, UTI, bilateral flank pain. Lupus. EXAM: CT ABDOMEN AND PELVIS WITHOUT CONTRAST TECHNIQUE: Multidetector CT imaging of the abdomen and pelvis was performed following the standard protocol without IV contrast. RADIATION DOSE REDUCTION: This exam was performed according to the departmental dose-optimization program which includes automated exposure control, adjustment of the mA and/or kV according to patient size and/or use of iterative reconstruction technique. COMPARISON:  None Available. FINDINGS: Lower chest: No acute findings. Heart is at the upper limits of normal in size to mildly enlarged. No pericardial or pleural effusion. Air in the esophagus can be seen with dysmotility. Hepatobiliary: Liver is decreased in  attenuation diffusely and is enlarged, 22.0 cm. Liver and gallbladder are otherwise grossly unremarkable. No biliary ductal dilatation. Pancreas: Negative. Spleen: Negative. Adrenals/Urinary Tract: Adrenal glands and right kidney are unremarkable. 7 mm stone in the lower pole left kidney. Ureters are decompressed. Bladder is grossly unremarkable. Stomach/Bowel: Stomach, small bowel, appendix and colon are unremarkable. Vascular/Lymphatic: Atherosclerotic calcification of the aorta. No pathologically enlarged lymph nodes. Reproductive: Hysterectomy.  No adnexal mass. Other: No free fluid.  Mesenteries and peritoneum are unremarkable. Musculoskeletal: Minimal degenerative change in the spine. IMPRESSION: 1. No acute findings. 2. Nonobstructing left renal stone. 3. Steatotic enlarged liver. 4.  Aortic atherosclerosis (ICD10-I70.0). Electronically Signed   By: Leanna Battles M.D.   On: 02/24/2024 11:05    Procedures Procedures  {Document cardiac monitor, telemetry assessment procedure when appropriate:1}  Medications Ordered in ED Medications  ipratropium-albuterol (DUONEB) 0.5-2.5 (3) MG/3ML nebulizer solution 3 mL (has no administration in time range)  albuterol (PROVENTIL) (2.5 MG/3ML) 0.083% nebulizer solution 2.5 mg (has no administration in time range)  ketorolac (TORADOL) 30 MG/ML injection 30 mg (30 mg Intravenous Given 02/24/24 1109)  diphenhydrAMINE (BENADRYL) injection 25 mg (25 mg Intravenous Given 02/24/24 1109)  famotidine (PEPCID) IVPB 20 mg premix (0 mg Intravenous Stopped 02/24/24 1217)  acetaminophen (TYLENOL) tablet 650 mg (650 mg Oral Given 02/24/24 1329)  ondansetron (ZOFRAN) injection 4 mg (4 mg Intravenous Given 02/24/24 1414)  cefTRIAXone (ROCEPHIN) 2 g in sodium chloride 0.9 % 100 mL IVPB (2 g Intravenous New Bag/Given 02/24/24 1429)    ED Course/ Medical Decision Making/ A&P   {   Click here for ABCD2, HEART and other calculatorsREFRESH Note before signing :1}                               Medical Decision Making Amount and/or Complexity of Data Reviewed Labs: ordered. Radiology: ordered.  Risk OTC drugs. Prescription drug management. Decision regarding hospitalization.   Patient with hypoxia from COVID.  She will be admitted to medicine  {Document critical care time when appropriate:1} {Document review of labs and clinical decision tools ie heart score, Chads2Vasc2 etc:1}  {Document your independent review of radiology images, and any outside records:1} {Document your discussion with family members, caretakers, and with consultants:1} {Document social determinants of health affecting pt's care:1} {Document your decision making why or why not admission, treatments were needed:1} Final Clinical Impression(s) / ED Diagnoses  Final diagnoses:  COVID-19  Hypoxia    Rx / DC Orders ED Discharge Orders     None

## 2024-02-24 NOTE — ED Notes (Signed)
 Pt desatted to

## 2024-02-24 NOTE — ED Notes (Signed)
 pt de-sated while attempting to ambulate

## 2024-02-24 NOTE — ED Notes (Signed)
 X-ray at bedside

## 2024-02-24 NOTE — H&P (Signed)
 History and Physical  Holly Ortiz ZOX:[id number] DOB: 1966/09/16 DOA: 02/24/2024  PCP: Mliss Sax, MD   Chief Complaint: Lethargy, body aches  HPI: Holly Ortiz is a 58 y.o. female with medical history significant for poorly controlled type 2 diabetes, hypertension, hyperlipidemia, lupus being admitted to the hospital with sepsis due to COVID-19.  Patient has had intermittent dysuria with gross hematuria for a couple of months.  She was seen in urgent care, treated with Azo and Macrobid.  Urine culture done in urgent care grew 80,000 colonies of E. coli.  She completed the course of therapy, her dysuria went away but she had acute abdominal bloating, generalized tenderness.  She also had some nausea without vomiting.  On 3/20 her PCP started her on an empiric course of Bactrim and urine culture was obtained once again.  Urine culture from 3/20 with mixed vaginal flora.  Today she came to the ER because she has continued to feel unwell, she started to feel worse particularly about 2 to 3 days ago.  She has body aches, bilateral flank pain, nausea, severe lethargy and weakness.  She also has pain and swelling in her legs with ambulation.  Some of the symptoms happen when she gets a flare of her lupus.  She denies any cough, shortness of breath, dysuria, urgency or frequency of urination.  Currently today she does not have any gross hematuria.  In the emergency department, workup as noted below shows evidence of bilateral patchy opacities on chest x-ray, she is meeting sepsis criteria and tested positive for COVID.  Review of Systems: Please see HPI for pertinent positives and negatives. A complete 10 system review of systems are otherwise negative.  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-I)    Anxiety    on meds   Asthma    childhood   Cataract 2015   bilateral   Constipation    takes Miralax daily   Depression     on meds   Diabetes mellitus    type II-on meds   Hyperlipidemia    on meds   Hypertension    on meds   Lupus 2022   dx 02/2021   Seasonal allergies    Urticaria, chronic    UTI (lower urinary tract infection)    Past Surgical History:  Procedure Laterality Date   ABDOMINAL HYSTERECTOMY  08/07/2019   BREAST ENHANCEMENT SURGERY  1993   breast lift/reduction   CATARACT EXTRACTION, BILATERAL Bilateral 2014   CATARACT EXTRACTION, BILATERAL Bilateral 04/2022   COLONOSCOPY  2014   in [place]   LAPAROSCOPIC VAGINAL HYSTERECTOMY WITH SALPINGO OOPHORECTOMY Bilateral 08/07/2019   Procedure: LAPAROSCOPIC ASSISTED VAGINAL HYSTERECTOMY WITH SALPINGO OOPHORECTOMY, possible abdominal hysterectomy;  Surgeon: Harold Hedge, MD;  Location: [facility] OR;  Service: Gynecology;  Laterality: Bilateral;  possible abdominal hysterectomy pt is diabetic and hypertensive   SOFT TISSUE MASS EXCISION Right 2016   [place]   TUBAL LIGATION  1995   tummy tuck  1993   Social History:  reports that she has never smoked. She has never been exposed to tobacco smoke. She has never used smokeless tobacco. She reports that she does not drink alcohol and does not use drugs.  Allergies  Allergen Reactions   Ozempic (0.25 Or 0.5 Mg-Dose) [Semaglutide(0.25 Or 0.5mg -Dos)] Nausea And Vomiting   Actos [Pioglitazone Hydrochloride] Other (See Comments)    Wt gain   Lisinopril     Angioedema, including likely GI  involvement   Metformin And Related Diarrhea and Nausea And Vomiting    Family History  Problem Relation Age of Onset   Colon polyps Mother 93   Colon cancer Mother 51   Hypertension Mother    Diabetes Father    Hypertension Father    COPD Father        Cause of death   Hypertension Sister    Diabetes Sister    Cancer Sister        Brain glioblastoma   Diabetes Sister    Hypertension Brother    Hyperlipidemia Other    Heart disease Other    Esophageal cancer Neg Hx    Stomach cancer Neg Hx    Rectal  cancer Neg Hx      Prior to Admission medications   Medication Sig Start Date End Date Taking? Authorizing Provider  acetaminophen (TYLENOL) 500 MG tablet Take 2 tablets (1,000 mg total) by mouth every 6 (six) hours as needed. 08/08/19   Harold Hedge, MD  amLODipine (NORVASC) 10 MG tablet Take 1 tablet (10 mg total) by mouth daily. 04/24/23   Mliss Sax, MD  atorvastatin (LIPITOR) 40 MG tablet Take 1 tablet (40 mg total) by mouth daily. 04/24/23   Mliss Sax, MD  Blood Glucose Monitoring Suppl DEVI 1 each by Does not apply route in the morning, at noon, and at bedtime. May substitute to any manufacturer covered by patient's insurance. 02/12/24   Thapa, [place], MD  Cholecalciferol (VITAMIN D) 50 MCG (2000 UT) CAPS Take 2,000 Units by mouth daily.     [provider]  cyclobenzaprine (FLEXERIL) 5 MG tablet TAKE 1 TABLET BY MOUTH AT BEDTIME AS NEEDED FOR MUSCLE SPASMS. 09/03/23   Rice, Jamesetta Orleans, MD  dapagliflozin propanediol (FARXIGA) 10 MG TABS tablet Take 1 tablet (10 mg total) by mouth daily. Patient not taking: Reported on 02/21/2024 07/12/23   Thapa, [place], MD  escitalopram (LEXAPRO) 20 MG tablet Take 1 tablet (20 mg total) by mouth daily. 04/24/23   Mliss Sax, MD  fenofibrate (TRICOR) 145 MG tablet Take 1 tablet (145 mg total) by mouth daily. 02/08/21   Reather Littler, MD  glimepiride (AMARYL) 4 MG tablet Take 1 tablet (4 mg total) by mouth daily before breakfast. 02/12/24   Thapa, [place], MD  Glucose Blood (BLOOD GLUCOSE TEST STRIPS) STRP 1 each by In Vitro route in the morning, at noon, and at bedtime. May substitute to any manufacturer covered by patient's insurance. 02/12/24 06/24/24  Thapa, [place], MD  hydroxychloroquine (PLAQUENIL) 200 MG tablet TAKE 2 TABLETS BY MOUTH EVERY DAY 02/18/24   Rice, Jamesetta Orleans, MD  Insulin Pen Needle (BD PEN NEEDLE NANO 2ND GEN) 32G X 4 MM MISC Use four times daily to inject insulin. 07/30/19   Reather Littler, MD  insulin regular  human CONCENTRATED (HUMULIN R U-500 KWIKPEN) 500 UNIT/ML KwikPen INJECT 40 UNITS UNDER THE SKIN BEFORE BREAKFAST, 20-25 UNITS BEFORE LUNCH AND 40 UNITS AT SUPPER. 07/12/23   Thapa, [place], MD  Lancet Device MISC 1 each by Does not apply route in the morning, at noon, and at bedtime. May substitute to any manufacturer covered by patient's insurance. 02/12/24 03/13/24  Thapa, [place], MD  Lancets Misc. MISC 1 each by Does not apply route in the morning, at noon, and at bedtime. May substitute to any manufacturer covered by patient's insurance. 02/12/24 03/13/24  Thapa, [place], MD  Melatonin 5 MG TABS Take 5 mg by mouth at bedtime as needed.  [provider]  MIEBO 1.338 GM/ML SOLN Apply 1 drop to eye 4 (four) times daily. 11/10/22   [provider]  montelukast (SINGULAIR) 10 MG tablet TAKE 1 TABLET BY MOUTH EVERYDAY AT BEDTIME 10/17/23   Mliss Sax, MD  nitrofurantoin, macrocrystal-monohydrate, (MACROBID) 100 MG capsule Take 1 capsule (100 mg total) by mouth 2 (two) times daily. Patient not taking: Reported on 02/21/2024 02/13/24   Pola Corn B, NP  phenazopyridine (PYRIDIUM) 200 MG tablet Take 1 tablet (200 mg total) by mouth 3 (three) times daily. 02/13/24   Reddick, Nicola Girt B, NP  polyethylene glycol (MIRALAX / GLYCOLAX) 17 g packet Take 17 g by mouth daily.    [provider]  sulfamethoxazole-trimethoprim (BACTRIM DS) 800-160 MG tablet Take 1 tablet by mouth 2 (two) times daily for 7 days. 02/21/24 02/28/24  Mliss Sax, MD  triamcinolone ointment (KENALOG) 0.5 % Apply 1 Application topically 2 (two) times daily as needed. 08/07/23   Fuller Plan, MD  triamterene-hydrochlorothiazide (MAXZIDE-25) 37.5-25 MG tablet TAKE 1 TABLET BY MOUTH EVERY DAY 10/17/23   Mliss Sax, MD  vitamin B-12 (CYANOCOBALAMIN) 1000 MCG tablet Take 1,000 mcg by mouth daily.    [provider]  zinc gluconate 50 MG tablet Take 50 mg by mouth daily.      [provider]    Physical Exam: BP (!) 142/48 (BP Location: Left Arm)   Pulse (!) 116   Temp (!) 100.5 F (38.1 C) (Oral)   Resp (!) 27   Ht 5\' 3"  (1.6 m)   Wt 96.6 kg   LMP 05/13/2019 Comment: patient had not had her period in a year and it came back in May and June 2020  SpO2 (!) 86%   BMI 37.73 kg/m  General:  Alert, oriented, calm, in no acute distress, wearing 2 L nasal cannula oxygen.  Her husband is at the bedside.  Patient looks nontoxic, but she does look tired.  She has no cough, speaking in full sentences without any distress. Eyes: EOMI, clear conjuctivae, white sclerea Neck: supple, no masses, trachea mildline  Cardiovascular: RRR, no murmurs or rubs, no peripheral edema  Respiratory: clear to auscultation bilaterally, no wheezes, no crackles  Abdomen: soft, nontender, nondistended, normal bowel tones heard  Skin: dry, no rashes  Musculoskeletal: no joint effusions, normal range of motion  Psychiatric: appropriate affect, normal speech  Neurologic: extraocular muscles intact, clear speech, moving all extremities with intact sensorium         Labs on Admission:  Basic Metabolic Panel: Recent Labs  Lab 02/21/24 1428 02/24/24 1043  NA 139 139  K 4.1 4.4  CL 100 103  CO2 30 28  GLUCOSE 136* 143*  BUN 28* 29*  CREATININE 0.73 0.99  CALCIUM 9.7 9.3   Liver Function Tests: Recent Labs  Lab 02/21/24 1428 02/24/24 1043  AST 10 22  ALT 13 24  ALKPHOS 70 62  BILITOT 0.3 0.3  PROT 7.0 7.3  ALBUMIN 4.1 3.5   Recent Labs  Lab 02/24/24 1043  LIPASE 29   No results for input(s): "AMMONIA" in the last 168 hours. CBC: Recent Labs  Lab 02/21/24 1428 02/24/24 1043  WBC 10.3 6.3  NEUTROABS 7.2 5.0  HGB 11.8* 11.0*  HCT 36.5 35.4*  MCV 80.6 84.1  PLT 330.0 243   Cardiac Enzymes: No results for input(s): "CKTOTAL", "CKMB", "CKMBINDEX", "TROPONINI" in the last 168 hours. BNP (last 3 results) No results for input(s): "BNP" in the last  8760  hours.  ProBNP (last 3 results) No results for input(s): "PROBNP" in the last 8760 hours.  CBG: No results for input(s): "GLUCAP" in the last 168 hours.  Radiological Exams on Admission: DG Chest Port 1 View Result Date: 02/24/2024 CLINICAL DATA:  Shortness of breath EXAM: PORTABLE CHEST 1 VIEW COMPARISON:  Chest radiograph dated 12/23/2018 FINDINGS: Bilateral costophrenic angles are not included within the field of view. Normal lung volumes. Mild bilateral interstitial and bibasilar patchy opacities. No pneumothorax. Enlarged cardiomediastinal silhouette is likely projectional. No acute osseous abnormality. IMPRESSION: Mild bilateral interstitial and bibasilar patchy opacities, which may represent pulmonary edema or atypical infection. Electronically Signed   By: Agustin Cree M.D.   On: 02/24/2024 15:01   CT Renal Stone Study Result Date: 02/24/2024 CLINICAL DATA:  Intermittent hematuria, UTI, bilateral flank pain. Lupus. EXAM: CT ABDOMEN AND PELVIS WITHOUT CONTRAST TECHNIQUE: Multidetector CT imaging of the abdomen and pelvis was performed following the standard protocol without IV contrast. RADIATION DOSE REDUCTION: This exam was performed according to the departmental dose-optimization program which includes automated exposure control, adjustment of the mA and/or kV according to patient size and/or use of iterative reconstruction technique. COMPARISON:  None Available. FINDINGS: Lower chest: No acute findings. Heart is at the upper limits of normal in size to mildly enlarged. No pericardial or pleural effusion. Air in the esophagus can be seen with dysmotility. Hepatobiliary: Liver is decreased in attenuation diffusely and is enlarged, 22.0 cm. Liver and gallbladder are otherwise grossly unremarkable. No biliary ductal dilatation. Pancreas: Negative. Spleen: Negative. Adrenals/Urinary Tract: Adrenal glands and right kidney are unremarkable. 7 mm stone in the lower pole left kidney. Ureters are  decompressed. Bladder is grossly unremarkable. Stomach/Bowel: Stomach, small bowel, appendix and colon are unremarkable. Vascular/Lymphatic: Atherosclerotic calcification of the aorta. No pathologically enlarged lymph nodes. Reproductive: Hysterectomy.  No adnexal mass. Other: No free fluid.  Mesenteries and peritoneum are unremarkable. Musculoskeletal: Minimal degenerative change in the spine. IMPRESSION: 1. No acute findings. 2. Nonobstructing left renal stone. 3. Steatotic enlarged liver. 4.  Aortic atherosclerosis (ICD10-I70.0). Electronically Signed   By: Leanna Battles M.D.   On: 02/24/2024 11:05   Assessment/Plan Holly Ortiz is a 58 y.o. female with medical history significant for poorly controlled type 2 diabetes, hypertension, hyperlipidemia, lupus being admitted to the hospital with sepsis due to COVID-19.   Sepsis-meeting criteria with tachycardia, tachypnea, fever.  Source is COVID-19. -Observation admission to progressive -Check stat lactic acid and trend -Follow-up blood culture, urine culture  COVID-19-would explain the patient's subjective fevers, chills, lethargy, body aches.  Currently she is requiring 2 L nasal cannula oxygen due to associated acute hypoxic respiratory failure.  Technically she meets criteria for remdesivir, however she appears stable. -IV Decadron x 10 days -Consider remdesivir if not improving  Acute hypoxic respiratory failure-likely due to COVID-19.  I doubt bacterial pneumonia, PE or other cause due to lack of leukocytosis, productive cough, allergic chest pain.  Will treat as above for now, however if she develops any of the symptoms, or she has persistent hypoxia and tachycardia would consider rule out PE.  Suspected UTI-recent outpatient urine culture with mixed genital flora, will discontinue further antibiotics  Lupus-continue Plaquenil  Poorly controlled type 2 diabetes-last hemoglobin A1c earlier this month was 12.0.  Anticipate she will have  some difficult to control blood sugars in the setting of steroids -Carb modified diet -Humulin R U-500 at 30 units 3 times daily with meals, with additional sliding scale as needed  Hypertension-continue  home amlodipine and Maxide  DVT prophylaxis: Lovenox     Code Status: Full Code  Consults called: None  Admission status: Observation   Time spent: 48 minutes  Kisean Rollo Sharlette Dense MD [facility] Pager [phone]  If 7PM-7AM, please contact night-coverage [email or web address] Password [email or web address]  02/24/2024, 3:49 PM

## 2024-02-25 ENCOUNTER — Other Ambulatory Visit (HOSPITAL_COMMUNITY): Payer: Self-pay

## 2024-02-25 DIAGNOSIS — J9601 Acute respiratory failure with hypoxia: Secondary | ICD-10-CM | POA: Diagnosis not present

## 2024-02-25 LAB — CBC
HCT: 35.8 % — ABNORMAL LOW (ref 36.0–46.0)
Hemoglobin: 10.9 g/dL — ABNORMAL LOW (ref 12.0–15.0)
MCH: 25.5 pg — ABNORMAL LOW (ref 26.0–34.0)
MCHC: 30.4 g/dL (ref 30.0–36.0)
MCV: 83.8 fL (ref 80.0–100.0)
Platelets: 262 10*3/uL (ref 150–400)
RBC: 4.27 MIL/uL (ref 3.87–5.11)
RDW: 13.6 % (ref 11.5–15.5)
WBC: 4.6 10*3/uL (ref 4.0–10.5)
nRBC: 0 % (ref 0.0–0.2)

## 2024-02-25 LAB — BASIC METABOLIC PANEL
Anion gap: 10 (ref 5–15)
BUN: 27 mg/dL — ABNORMAL HIGH (ref 6–20)
CO2: 27 mmol/L (ref 22–32)
Calcium: 8.8 mg/dL — ABNORMAL LOW (ref 8.9–10.3)
Chloride: 100 mmol/L (ref 98–111)
Creatinine, Ser: 0.85 mg/dL (ref 0.44–1.00)
GFR, Estimated: >60 mL/min (ref >60)
Glucose, Bld: 391 mg/dL — ABNORMAL HIGH (ref 70–99)
Potassium: 4 mmol/L (ref 3.5–5.1)
Sodium: 137 mmol/L (ref 135–145)

## 2024-02-25 LAB — GLUCOSE, CAPILLARY
Glucose-Capillary: 504 mg/dL (ref 70–99)
Glucose-Capillary: 268 mg/dL — ABNORMAL HIGH (ref 70–99)
Glucose-Capillary: 290 mg/dL — ABNORMAL HIGH (ref 70–99)

## 2024-02-25 MED ORDER — GUAIFENESIN ER 600 MG PO TB12
1200.0000 mg | ORAL_TABLET | Freq: Two times a day (BID) | ORAL | Status: DC
  Administered 2024-02-25: 1200 mg via ORAL
  Filled 2024-02-25: qty 2

## 2024-02-25 MED ORDER — METOPROLOL TARTRATE 5 MG/5ML IV SOLN: 5.0000 mg | INTRAVENOUS | Status: DC | PRN

## 2024-02-25 MED ORDER — GLIMEPIRIDE 4 MG PO TABS
4.0000 mg | ORAL_TABLET | Freq: Every day | ORAL | Status: DC
  Administered 2024-02-25: 4 mg via ORAL
  Filled 2024-02-25: qty 1

## 2024-02-25 MED ORDER — TRAZODONE HCL 50 MG PO TABS: 50.0000 mg | ORAL_TABLET | Freq: Every evening | ORAL | Status: DC | PRN

## 2024-02-25 MED ORDER — IPRATROPIUM-ALBUTEROL 0.5-2.5 (3) MG/3ML IN SOLN: 3.0000 mL | RESPIRATORY_TRACT | Status: DC | PRN

## 2024-02-25 MED ORDER — ALBUTEROL SULFATE HFA 108 (90 BASE) MCG/ACT IN AERS
2.0000 | INHALATION_SPRAY | Freq: Four times a day (QID) | RESPIRATORY_TRACT | 0 refills | Status: AC | PRN
  Filled 2024-02-25: qty 6.7, 25d supply, fill #0

## 2024-02-25 MED ORDER — HYDRALAZINE HCL 20 MG/ML IJ SOLN: 10.0000 mg | INTRAMUSCULAR | Status: DC | PRN

## 2024-02-25 MED ORDER — INSULIN ASPART 100 UNIT/ML IJ SOLN
10.0000 [IU] | Freq: Once | INTRAMUSCULAR | Status: AC
Start: 2024-02-25 — End: 2024-02-25
  Administered 2024-02-25: 10 [IU] via SUBCUTANEOUS

## 2024-02-25 NOTE — Hospital Course (Addendum)
 Brief Narrative:  58 year old with history of poorly controlled DM2, HTN, HLD, lupus admitted to the hospital secondary to sepsis from COVID-19.  She reported also of dysuria ongoing for couple months now which was treated outpatient. This morning feeling significantly well on room air.  Ambulating without any signs of hypoxia.  Will discharge on as needed bronchodilators, I-S and flutter valve.  Due to concerns of severe hyperglycemia, will discontinue steroids   Assessment & Plan:  Principal Problem:   Acute respiratory failure with hypoxia (HCC)   Sepsis secondary to COVID-19 infection Acute mild hypoxia - This morning feeling significantly well on room air.  Ambulating without any signs of hypoxia.  Will discharge on as needed bronchodilators, I-S and flutter valve.  Due to concerns of severe hyperglycemia, will discontinue steroids  Lupus -Continue Plaquenil  Nonobstructive left renal stone - Advised oral hydration  Poorly controlled DM2, insulin-dependent - Resume home regimen.  Will discontinue steroid use  Essential hypertension -Continue Norvasc and Maxide.  IV as needed  Hyperlipidemia - Statin  Depression - Celexa    DVT prophylaxis: enoxaparin (LOVENOX) injection 40 mg Start: 02/24/24 2200      Code Status: Full Code Family Communication:   Discharge    Subjective:  Feeling well no complaints.  Ambulating without any complaints or shortness of breath.  Denies any dysuria.  Wishing to go home.  Examination:  General exam: Appears calm and comfortable  Respiratory system: Clear to auscultation. Respiratory effort normal. Cardiovascular system: S1 & S2 heard, RRR. No JVD, murmurs, rubs, gallops or clicks. No pedal edema. Gastrointestinal system: Abdomen is nondistended, soft and nontender. No organomegaly or masses felt. Normal bowel sounds heard. Central nervous system: Alert and oriented. No focal neurological deficits. Extremities: Symmetric 5 x 5  power. Skin: No rashes, lesions or ulcers Psychiatry: Judgement and insight appear normal. Mood & affect appropriate.

## 2024-02-25 NOTE — TOC Transition Note (Signed)
 Transition of Care [facility]) - Discharge Note   Patient Details  Name: Holly Ortiz MRN: [id number] Date of Birth: Jun 21, 1966  Transition of Care [facility] CM/SW Contact:  Lanier Clam, RN Phone Number: 02/25/2024, 10:30 AM   Clinical Narrative:d/c home no CM needs.       Final next level of care: Home/Self Care Barriers to Discharge: No Barriers Identified   Patient Goals and CMS Choice Patient states their goals for this hospitalization and ongoing recovery are:: Home CMS Medicare.gov Compare Post Acute Care list provided to:: Patient Choice offered to / list presented to : Patient [place] ownership interest in [facility].provided to:: Patient    Discharge Placement                       Discharge Plan and Services Additional resources added to the After Visit Summary for                                       Social Drivers of Health (SDOH) Interventions SDOH Screenings   Food Insecurity: No Food Insecurity (02/24/2024)  Housing: Low Risk  (02/24/2024)  Transportation Needs: No Transportation Needs (02/24/2024)  Utilities: Not At Risk (02/24/2024)  Depression (PHQ2-9): Low Risk  (04/24/2023)  Financial Resource Strain: Low Risk  (10/25/2023)  Physical Activity: Unknown (10/25/2023)  Social Connections: Moderately Integrated (10/25/2023)  Stress: Patient Declined (10/25/2023)  Tobacco Use: Low Risk  (02/24/2024)     Readmission Risk Interventions     No data to display

## 2024-02-25 NOTE — Discharge Summary (Signed)
 Physician Discharge Summary  Holly Ortiz MWU:[id number] DOB: 03-15-1966 DOA: 02/24/2024  PCP: Mliss Sax, MD  Admit date: 02/24/2024 Discharge date: 02/25/2024  Admitted From: Home Disposition: Home  Recommendations for Outpatient Follow-up:  Follow up with PCP in 1-2 weeks Please obtain BMP/CBC in one week your next doctors visit.  As needed albuterol Continue home isolation until March 06, 2024   Discharge Condition: Stable CODE STATUS: Full code Diet recommendation: Diabetic  Brief/Interim Summary: Brief Narrative:  58 year old with history of poorly controlled DM2, HTN, HLD, lupus admitted to the hospital secondary to sepsis from COVID-19.  She reported also of dysuria ongoing for couple months now which was treated outpatient. This morning feeling significantly well on room air.  Ambulating without any signs of hypoxia.  Will discharge on as needed bronchodilators, I-S and flutter valve.  Due to concerns of severe hyperglycemia, will discontinue steroids   Assessment & Plan:  Principal Problem:   Acute respiratory failure with hypoxia (HCC)   Sepsis secondary to COVID-19 infection Acute mild hypoxia - This morning feeling significantly well on room air.  Ambulating without any signs of hypoxia.  Will discharge on as needed bronchodilators, I-S and flutter valve.  Due to concerns of severe hyperglycemia, will discontinue steroids  Lupus -Continue Plaquenil  Nonobstructive left renal stone - Advised oral hydration  Poorly controlled DM2, insulin-dependent - Resume home regimen.  Will discontinue steroid use  Essential hypertension -Continue Norvasc and Maxide.  IV as needed  Hyperlipidemia - Statin  Depression - Celexa    DVT prophylaxis: enoxaparin (LOVENOX) injection 40 mg Start: 02/24/24 2200      Code Status: Full Code Family Communication:   Discharge    Subjective:  Feeling well no complaints.  Ambulating without any complaints or  shortness of breath.  Denies any dysuria.  Wishing to go home.  Examination:  General exam: Appears calm and comfortable  Respiratory system: Clear to auscultation. Respiratory effort normal. Cardiovascular system: S1 & S2 heard, RRR. No JVD, murmurs, rubs, gallops or clicks. No pedal edema. Gastrointestinal system: Abdomen is nondistended, soft and nontender. No organomegaly or masses felt. Normal bowel sounds heard. Central nervous system: Alert and oriented. No focal neurological deficits. Extremities: Symmetric 5 x 5 power. Skin: No rashes, lesions or ulcers Psychiatry: Judgement and insight appear normal. Mood & affect appropriate.    Discharge Diagnoses:  Principal Problem:   Acute respiratory failure with hypoxia [facility])      Discharge Exam: Vitals:   02/25/24 0843 02/25/24 0948  BP:    Pulse:    Resp:    Temp:    SpO2: 98% 97%   Vitals:   02/25/24 0132 02/25/24 0457 02/25/24 0843 02/25/24 0948  BP: (!) 111/47 (!) 159/69    Pulse: 84 91    Resp: 20 20    Temp: 98.9 F (37.2 C) 98.4 F (36.9 C)    TempSrc: Oral Oral    SpO2: 98% 99% 98% 97%  Weight:      Height:          Discharge Instructions   Allergies as of 02/25/2024       Reactions   Ozempic (0.25 Or 0.5 Mg-dose) [semaglutide(0.25 Or 0.5mg -dos)] Nausea And Vomiting   Lisinopril Swelling, Other (See Comments)   Angioedema, including likely GI involvement   Metformin And Related Diarrhea, Nausea And Vomiting   Pioglitazone Hydrochloride Other (See Comments)   Reaction not stated        Medication List  STOP taking these medications    dapagliflozin propanediol 10 MG Tabs tablet Commonly known as: Farxiga   nitrofurantoin (macrocrystal-monohydrate) 100 MG capsule Commonly known as: MACROBID   sulfamethoxazole-trimethoprim 800-160 MG tablet Commonly known as: BACTRIM DS       TAKE these medications    acetaminophen 500 MG tablet Commonly known as: TYLENOL Take 2 tablets  (1,000 mg total) by mouth every 6 (six) hours as needed. What changed: reasons to take this   albuterol 108 (90 Base) MCG/ACT inhaler Commonly known as: VENTOLIN HFA Inhale 2 puffs into the lungs every 6 (six) hours as needed for wheezing or shortness of breath.   amLODipine 10 MG tablet Commonly known as: NORVASC Take 1 tablet (10 mg total) by mouth daily.   atorvastatin 40 MG tablet Commonly known as: LIPITOR Take 1 tablet (40 mg total) by mouth daily.   BD Pen Needle Nano 2nd Gen 32G X 4 MM Misc Generic drug: Insulin Pen Needle Use four times daily to inject insulin.   Blood Glucose Monitoring Suppl Devi 1 each by Does not apply route in the morning, at noon, and at bedtime. May substitute to any manufacturer covered by patient's insurance.   BLOOD GLUCOSE TEST STRIPS Strp 1 each by In Vitro route in the morning, at noon, and at bedtime. May substitute to any manufacturer covered by patient's insurance.   cyanocobalamin 1000 MCG tablet Commonly known as: VITAMIN B12 Take 1,000 mcg by mouth daily.   cyclobenzaprine 5 MG tablet Commonly known as: FLEXERIL TAKE 1 TABLET BY MOUTH AT BEDTIME AS NEEDED FOR MUSCLE SPASMS.   escitalopram 20 MG tablet Commonly known as: LEXAPRO Take 1 tablet (20 mg total) by mouth daily.   fenofibrate 145 MG tablet Commonly known as: Tricor Take 1 tablet (145 mg total) by mouth daily.   glimepiride 4 MG tablet Commonly known as: AMARYL Take 1 tablet (4 mg total) by mouth daily before breakfast.   HumuLIN R U-500 KwikPen 500 UNIT/ML KwikPen Generic drug: insulin regular human CONCENTRATED INJECT 40 UNITS UNDER THE SKIN BEFORE BREAKFAST, 20-25 UNITS BEFORE LUNCH AND 40 UNITS AT SUPPER. What changed:  how much to take how to take this when to take this additional instructions   hydroxychloroquine 200 MG tablet Commonly known as: PLAQUENIL TAKE 2 TABLETS BY MOUTH EVERY DAY   Lancet Device Misc 1 each by Does not apply route in the  morning, at noon, and at bedtime. May substitute to any manufacturer covered by patient's insurance.   Lancets Misc. Misc 1 each by Does not apply route in the morning, at noon, and at bedtime. May substitute to any manufacturer covered by patient's insurance.   melatonin 5 MG Tabs Take 5 mg by mouth at bedtime as needed (FOR SLEEP).   Miebo 1.338 GM/ML Soln Generic drug: Perfluorohexyloctane Place 1 drop into both eyes 2 (two) times daily.   montelukast 10 MG tablet Commonly known as: SINGULAIR TAKE 1 TABLET BY MOUTH EVERYDAY AT BEDTIME What changed: See the new instructions.   phenazopyridine 200 MG tablet Commonly known as: PYRIDIUM Take 1 tablet (200 mg total) by mouth 3 (three) times daily. What changed:  when to take this reasons to take this   polyethylene glycol 17 g packet Commonly known as: MIRALAX / GLYCOLAX Take 17 g by mouth daily as needed for mild constipation.   triamcinolone ointment 0.5 % Commonly known as: KENALOG Apply 1 Application topically 2 (two) times daily as needed.   triamterene-hydrochlorothiazide 37.5-25 MG  tablet Commonly known as: MAXZIDE-25 TAKE 1 TABLET BY MOUTH EVERY DAY   Vitamin D3 50 MCG (2000 UT) Tabs Take 2,000 Units by mouth daily.   zinc gluconate 50 MG tablet Take 50 mg by mouth daily.        Allergies  Allergen Reactions   Ozempic (0.25 Or 0.5 Mg-Dose) [Semaglutide(0.25 Or 0.5mg -Dos)] Nausea And Vomiting   Lisinopril Swelling and Other (See Comments)    Angioedema, including likely GI involvement   Metformin And Related Diarrhea and Nausea And Vomiting   Pioglitazone Hydrochloride Other (See Comments)    Reaction not stated    You were cared for by a hospitalist during your hospital stay. If you have any questions about your discharge medications or the care you received while you were in the hospital after you are discharged, you can call the unit and asked to speak with the hospitalist on call if the hospitalist  that took care of you is not available. Once you are discharged, your primary care physician will handle any further medical issues. Please note that no refills for any discharge medications will be authorized once you are discharged, as it is imperative that you return to your primary care physician (or establish a relationship with a primary care physician if you do not have one) for your aftercare needs so that they can reassess your need for medications and monitor your lab values.  You were cared for by a hospitalist during your hospital stay. If you have any questions about your discharge medications or the care you received while you were in the hospital after you are discharged, you can call the unit and asked to speak with the hospitalist on call if the hospitalist that took care of you is not available. Once you are discharged, your primary care physician will handle any further medical issues. Please note that NO REFILLS for any discharge medications will be authorized once you are discharged, as it is imperative that you return to your primary care physician (or establish a relationship with a primary care physician if you do not have one) for your aftercare needs so that they can reassess your need for medications and monitor your lab values.  Please request your Prim.MD to go over all Hospital Tests and Procedure/Radiological results at the follow up, please get all Hospital records sent to your Prim MD by signing hospital release before you go home.  Get CBC, CMP, 2 view Chest X ray checked  by Primary MD during your next visit or SNF MD in 5-7 days ( we routinely change or add medications that can affect your baseline labs and fluid status, therefore we recommend that you get the mentioned basic workup next visit with your PCP, your PCP may decide not to get them or add new tests based on their clinical decision)  On your next visit with your primary care physician please Get Medicines  reviewed and adjusted.  If you experience worsening of your admission symptoms, develop shortness of breath, life threatening emergency, suicidal or homicidal thoughts you must seek medical attention immediately by calling 911 or calling your MD immediately  if symptoms less severe.  You Must read complete instructions/literature along with all the possible adverse reactions/side effects for all the Medicines you take and that have been prescribed to you. Take any new Medicines after you have completely understood and accpet all the possible adverse reactions/side effects.   Do not drive, operate heavy machinery, perform activities at heights, swimming or  participation in water activities or provide baby sitting services if your were admitted for syncope or siezures until you have seen by Primary MD or a Neurologist and advised to do so again.  Do not drive when taking Pain medications.   Procedures/Studies: DG Chest Port 1 View Result Date: 02/24/2024 CLINICAL DATA:  Shortness of breath EXAM: PORTABLE CHEST 1 VIEW COMPARISON:  Chest radiograph dated 12/23/2018 FINDINGS: Bilateral costophrenic angles are not included within the field of view. Normal lung volumes. Mild bilateral interstitial and bibasilar patchy opacities. No pneumothorax. Enlarged cardiomediastinal silhouette is likely projectional. No acute osseous abnormality. IMPRESSION: Mild bilateral interstitial and bibasilar patchy opacities, which may represent pulmonary edema or atypical infection. Electronically Signed   By: Agustin Cree M.D.   On: 02/24/2024 15:01   CT Renal Stone Study Result Date: 02/24/2024 CLINICAL DATA:  Intermittent hematuria, UTI, bilateral flank pain. Lupus. EXAM: CT ABDOMEN AND PELVIS WITHOUT CONTRAST TECHNIQUE: Multidetector CT imaging of the abdomen and pelvis was performed following the standard protocol without IV contrast. RADIATION DOSE REDUCTION: This exam was performed according to the departmental  dose-optimization program which includes automated exposure control, adjustment of the mA and/or kV according to patient size and/or use of iterative reconstruction technique. COMPARISON:  None Available. FINDINGS: Lower chest: No acute findings. Heart is at the upper limits of normal in size to mildly enlarged. No pericardial or pleural effusion. Air in the esophagus can be seen with dysmotility. Hepatobiliary: Liver is decreased in attenuation diffusely and is enlarged, 22.0 cm. Liver and gallbladder are otherwise grossly unremarkable. No biliary ductal dilatation. Pancreas: Negative. Spleen: Negative. Adrenals/Urinary Tract: Adrenal glands and right kidney are unremarkable. 7 mm stone in the lower pole left kidney. Ureters are decompressed. Bladder is grossly unremarkable. Stomach/Bowel: Stomach, small bowel, appendix and colon are unremarkable. Vascular/Lymphatic: Atherosclerotic calcification of the aorta. No pathologically enlarged lymph nodes. Reproductive: Hysterectomy.  No adnexal mass. Other: No free fluid.  Mesenteries and peritoneum are unremarkable. Musculoskeletal: Minimal degenerative change in the spine. IMPRESSION: 1. No acute findings. 2. Nonobstructing left renal stone. 3. Steatotic enlarged liver. 4.  Aortic atherosclerosis (ICD10-I70.0). Electronically Signed   By: Leanna Battles M.D.   On: 02/24/2024 11:05     The results of significant diagnostics from this hospitalization (including imaging, microbiology, ancillary and laboratory) are listed below for reference.     Microbiology: Recent Results (from the past 240 hours)  Urine Culture     Status: None   Collection Time: 02/21/24  2:28 PM   Specimen: Blood  Result Value Ref Range Status   MICRO NUMBER: [id number]  Final   SPECIMEN QUALITY: Adequate  Final   Sample Source NOT GIVEN  Final   STATUS: FINAL  Final   Result:   Final    Mixed genital flora isolated. These superficial bacteria are not indicative of a urinary tract  infection. No further organism identification is warranted on this specimen. If clinically indicated, recollect clean-catch, mid-stream urine and transfer  immediately to Urine Culture Transport Tube.   Resp panel by RT-PCR (RSV, Flu A&B, Covid) Anterior Nasal Swab     Status: Abnormal   Collection Time: 02/24/24 10:43 AM   Specimen: Anterior Nasal Swab  Result Value Ref Range Status   SARS Coronavirus 2 by RT PCR POSITIVE (A) NEGATIVE Final    Comment: (NOTE) SARS-CoV-2 target nucleic acids are DETECTED.  The SARS-CoV-2 RNA is generally detectable in upper respiratory specimens during the acute phase of infection. Positive results are indicative of  the presence of the identified virus, but do not rule out bacterial infection or co-infection with other pathogens not detected by the test. Clinical correlation with patient history and other diagnostic information is necessary to determine patient infection status. The expected result is Negative.  Fact Sheet for Patients: BloggerCourse.com  Fact Sheet for Healthcare Providers: SeriousBroker.it  This test is not yet approved or cleared by the [place] FDA and  has been authorized for detection and/or diagnosis of SARS-CoV-2 by FDA under an Emergency Use Authorization (EUA).  This EUA will remain in effect (meaning this test can be used) for the duration of  the COVID-19 declaration under Section 564(b)(1) of the [place] ct, 21 U.S.C. section 360bbb-3(b)(1), unless the authorization is terminated or revoked sooner.     Influenza A by PCR NEGATIVE NEGATIVE Final   Influenza B by PCR NEGATIVE NEGATIVE Final    Comment: (NOTE) The Xpert Xpress SARS-CoV-2/FLU/RSV plus assay is intended as an aid in the diagnosis of influenza from Nasopharyngeal swab specimens and should not be used as a sole basis for treatment. Nasal washings and aspirates are unacceptable for Xpert Xpress  SARS-CoV-2/FLU/RSV testing.  Fact Sheet for Patients: BloggerCourse.com  Fact Sheet for Healthcare Providers: SeriousBroker.it  This test is not yet approved or cleared by the [place] FDA and has been authorized for detection and/or diagnosis of SARS-CoV-2 by FDA under an Emergency Use Authorization (EUA). This EUA will remain in effect (meaning this test can be used) for the duration of the COVID-19 declaration under Section 564(b)(1) of the Act, 21 U.S.C. section 360bbb-3(b)(1), unless the authorization is terminated or revoked.     Resp Syncytial Virus by PCR NEGATIVE NEGATIVE Final    Comment: (NOTE) Fact Sheet for Patients: BloggerCourse.com  Fact Sheet for Healthcare Providers: SeriousBroker.it  This test is not yet approved or cleared by the [place] FDA and has been authorized for detection and/or diagnosis of SARS-CoV-2 by FDA under an Emergency Use Authorization (EUA). This EUA will remain in effect (meaning this test can be used) for the duration of the COVID-19 declaration under Section 564(b)(1) of the Act, 21 U.S.C. section 360bbb-3(b)(1), unless the authorization is terminated or revoked.  Performed at [facility], [address], [place], [place] [address]   Urine Culture     Status: Abnormal (Preliminary result)   Collection Time: 02/24/24  1:38 PM   Specimen: Urine, Clean Catch  Result Value Ref Range Status   Specimen Description   Final    URINE, CLEAN CATCH Performed at [facility], [address] [place], [place] [address]    Special Requests   Final    NONE Performed at [facility], [address] [place], [place] [address]    Culture (A)  Final    20,000 COLONIES/mL ESCHERICHIA COLI SUSCEPTIBILITIES TO FOLLOW Performed at [facility], [address],  [place], [place] [address]    Report Status PENDING  Incomplete     Labs: BNP (last 3 results) No results for input(s): "BNP" in the last 8760 hours. Basic Metabolic Panel: Recent Labs  Lab 02/21/24 1428 02/24/24 1043 02/25/24 0447  NA 139 139 137  K 4.1 4.4 4.0  CL 100 103 100  CO2 30 28 27   GLUCOSE 136* 143* 391*  BUN 28* 29* 27*  CREATININE 0.73 0.99 0.85  CALCIUM 9.7 9.3 8.8*   Liver Function Tests: Recent Labs  Lab 02/21/24 1428 02/24/24 1043  AST 10  22  ALT 13 24  ALKPHOS 70 62  BILITOT 0.3 0.3  PROT 7.0 7.3  ALBUMIN 4.1 3.5   Recent Labs  Lab 02/24/24 1043  LIPASE 29   No results for input(s): "AMMONIA" in the last 168 hours. CBC: Recent Labs  Lab 02/21/24 1428 02/24/24 1043 02/25/24 0447  WBC 10.3 6.3 4.6  NEUTROABS 7.2 5.0  --   HGB 11.8* 11.0* 10.9*  HCT 36.5 35.4* 35.8*  MCV 80.6 84.1 83.8  PLT 330.0 243 262   Cardiac Enzymes: No results for input(s): "CKTOTAL", "CKMB", "CKMBINDEX", "TROPONINI" in the last 168 hours. BNP: Invalid input(s): "POCBNP" CBG: Recent Labs  Lab 02/24/24 1721 02/24/24 2116 02/24/24 2123 02/25/24 0129 02/25/24 0801  GLUCAP 200* 542* 540* 504* 268*   D-Dimer No results for input(s): "DDIMER" in the last 72 hours. Hgb A1c No results for input(s): "HGBA1C" in the last 72 hours. Lipid Profile No results for input(s): "CHOL", "HDL", "LDLCALC", "TRIG", "CHOLHDL", "LDLDIRECT" in the last 72 hours. Thyroid function studies No results for input(s): "TSH", "T4TOTAL", "T3FREE", "THYROIDAB" in the last 72 hours.  Invalid input(s): "FREET3" Anemia work up No results for input(s): "VITAMINB12", "FOLATE", "FERRITIN", "TIBC", "IRON", "RETICCTPCT" in the last 72 hours. Urinalysis    Component Value Date/Time   COLORURINE STRAW (A) 02/24/2024 1122   APPEARANCEUR CLEAR 02/24/2024 1122   LABSPEC 1.012 02/24/2024 1122   PHURINE 5.0 02/24/2024 1122   GLUCOSEU NEGATIVE 02/24/2024 1122   GLUCOSEU >=1000 (A) 03/28/2022 0755    HGBUR NEGATIVE 02/24/2024 1122   BILIRUBINUR NEGATIVE 02/24/2024 1122   BILIRUBINUR neg 02/21/2024 1519   KETONESUR NEGATIVE 02/24/2024 1122   PROTEINUR NEGATIVE 02/24/2024 1122   UROBILINOGEN 0.2 02/21/2024 1519   UROBILINOGEN 0.2 03/28/2022 0755   NITRITE NEGATIVE 02/24/2024 1122   LEUKOCYTESUR MODERATE (A) 02/24/2024 1122   Sepsis Labs Recent Labs  Lab 02/21/24 1428 02/24/24 1043 02/25/24 0447  WBC 10.3 6.3 4.6   Microbiology Recent Results (from the past 240 hours)  Urine Culture     Status: None   Collection Time: 02/21/24  2:28 PM   Specimen: Blood  Result Value Ref Range Status   MICRO NUMBER: [id number]  Final   SPECIMEN QUALITY: Adequate  Final   Sample Source NOT GIVEN  Final   STATUS: FINAL  Final   Result:   Final    Mixed genital flora isolated. These superficial bacteria are not indicative of a urinary tract infection. No further organism identification is warranted on this specimen. If clinically indicated, recollect clean-catch, mid-stream urine and transfer  immediately to Urine Culture Transport Tube.   Resp panel by RT-PCR (RSV, Flu A&B, Covid) Anterior Nasal Swab     Status: Abnormal   Collection Time: 02/24/24 10:43 AM   Specimen: Anterior Nasal Swab  Result Value Ref Range Status   SARS Coronavirus 2 by RT PCR POSITIVE (A) NEGATIVE Final    Comment: (NOTE) SARS-CoV-2 target nucleic acids are DETECTED.  The SARS-CoV-2 RNA is generally detectable in upper respiratory specimens during the acute phase of infection. Positive results are indicative of the presence of the identified virus, but do not rule out bacterial infection or co-infection with other pathogens not detected by the test. Clinical correlation with patient history and other diagnostic information is necessary to determine patient infection status. The expected result is Negative.  Fact Sheet for Patients: BloggerCourse.com  Fact Sheet for Healthcare  Providers: SeriousBroker.it  This test is not yet approved or cleared by the [place] and  has been authorized for detection and/or diagnosis of SARS-CoV-2 by FDA under an Emergency Use Authorization (EUA).  This EUA will remain in effect (meaning this test can be used) for the duration of  the COVID-19 declaration under Section 564(b)(1) of the A ct, 21 U.S.C. section 360bbb-3(b)(1), unless the authorization is terminated or revoked sooner.     Influenza A by PCR NEGATIVE NEGATIVE Final   Influenza B by PCR NEGATIVE NEGATIVE Final    Comment: (NOTE) The Xpert Xpress SARS-CoV-2/FLU/RSV plus assay is intended as an aid in the diagnosis of influenza from Nasopharyngeal swab specimens and should not be used as a sole basis for treatment. Nasal washings and aspirates are unacceptable for Xpert Xpress SARS-CoV-2/FLU/RSV testing.  Fact Sheet for Patients: BloggerCourse.com  Fact Sheet for Healthcare Providers: SeriousBroker.it  This test is not yet approved or cleared by the [place] FDA and has been authorized for detection and/or diagnosis of SARS-CoV-2 by FDA under an Emergency Use Authorization (EUA). This EUA will remain in effect (meaning this test can be used) for the duration of the COVID-19 declaration under Section 564(b)(1) of the Act, 21 U.S.C. section 360bbb-3(b)(1), unless the authorization is terminated or revoked.     Resp Syncytial Virus by PCR NEGATIVE NEGATIVE Final    Comment: (NOTE) Fact Sheet for Patients: BloggerCourse.com  Fact Sheet for Healthcare Providers: SeriousBroker.it  This test is not yet approved or cleared by the [place] FDA and has been authorized for detection and/or diagnosis of SARS-CoV-2 by FDA under an Emergency Use Authorization (EUA). This EUA will remain in effect (meaning this test can be  used) for the duration of the COVID-19 declaration under Section 564(b)(1) of the Act, 21 U.S.C. section 360bbb-3(b)(1), unless the authorization is terminated or revoked.  Performed at [facility], [address], [place], [place] [address]   Urine Culture     Status: Abnormal (Preliminary result)   Collection Time: 02/24/24  1:38 PM   Specimen: Urine, Clean Catch  Result Value Ref Range Status   Specimen Description   Final    URINE, CLEAN CATCH Performed at [facility], [address], [place], [place] [address]    Special Requests   Final    NONE Performed at [facility], [address], [place], [place] [address]    Culture (A)  Final    20,000 COLONIES/mL ESCHERICHIA COLI SUSCEPTIBILITIES TO FOLLOW Performed at [facility], [address], [place], [place] [address]    Report Status PENDING  Incomplete     Time coordinating discharge:  I have spent 35 minutes face to face with the patient and on the ward discussing the patients care, assessment, plan and disposition with other care givers. >50% of the time was devoted counseling the patient about the risks and benefits of treatment/Discharge disposition and coordinating care.   SIGNED:   Miguel Rota, MD  [facility] 02/25/2024, 11:23 AM   If 7PM-7AM, please contact night-coverage

## 2024-02-26 ENCOUNTER — Encounter: Payer: Self-pay | Admitting: Family Medicine

## 2024-02-26 LAB — URINE CULTURE: Culture: 20000 — AB

## 2024-03-16 ENCOUNTER — Other Ambulatory Visit: Payer: Self-pay | Admitting: Internal Medicine

## 2024-03-16 DIAGNOSIS — G8929 Other chronic pain: Secondary | ICD-10-CM

## 2024-03-17 NOTE — Telephone Encounter (Signed)
 Last Fill: 09/03/2023  Next Visit: 05/01/2024  Last Visit: 01/28/2024  Dx: Undifferentiated connective tissue disease   Current Dose per office note on 01/28/2024: when really bad sometimes flexeril   Okay to refill Flexeril?

## 2024-04-16 ENCOUNTER — Other Ambulatory Visit: Payer: Self-pay | Admitting: Family Medicine

## 2024-04-16 DIAGNOSIS — E7800 Pure hypercholesterolemia, unspecified: Secondary | ICD-10-CM

## 2024-04-17 NOTE — Progress Notes (Signed)
 Office Visit Note  Patient: Holly Ortiz             Date of Birth: 09/20/1966           MRN: [id number]             PCP: Tonna Frederic, MD Referring: Tonna Frederic,* Visit Date: 05/01/2024   Subjective:  Follow-up (Patient states she feels she is making a turn for the worse. Patient states it seems since November of last year it has gone from bad to worse. )   Discussed the use of AI scribe software for clinical note transcription with the patient, who gave verbal consent to proceed.  History of Present Illness   Holly Ortiz is a 58 y.o. 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    She has experienced episodes of hematuria, initially reported to her endocrinologist. A subsequent urinalysis revealed a urinary tract infection, for which she was prescribed antibiotics. She has a history of a large kidney stone in her left kidney, discovered during a hospital visit due to severe pain. The stone was not treated at the time, and she was discharged. During her hospital stay, she contracted COVID-19, leading to a week-long absence from work. She continues to experience pain, which was particularly severe during a recent vacation.  She describes persistent and worsening pain in her hips, radiating downwards and associated with significant weakness. This pain has severely impacted her mobility, necessitating the use of a wheelchair during a recent vacation. She experiences frequent falls due to lack of strength, with a notable incident occurring at work two weeks prior to the visit. The difficulty in rising from seated positions has been ongoing for six to seven years but has worsened recently.  She notes swelling in her feet, describing them as resembling 'elephant feet' at times, although they are less swollen today. She also reports a sensation of movement within her feet when they are swollen.  Her current  medications include hydroxychloroquine  and Claritin  for hives. She denies any issues with hydroxychloroquine  and takes Claritin  as needed for hives, which she experienced upon waking today. She has not been taking any over-the-counter medications for inflammation.   Previous HPI 01/28/2024 Holly Ortiz is a 58 y.o. 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     She has been experiencing chronic hives with intermittent episodes and a brief period of improvement lasting about a week. Benadryl  provides some relief but causes significant sedation. The hives are less intense today, likely due to taking Benadryl  over the weekend.   She describes severe fatigue, with energy only sufficient for work and returning home. Pain starts behind her neck and radiates to her toes, described as the worst pain she has ever experienced. Swelling is noted, particularly in her hands, affecting how her clothes fit. She also feels her abdomen is hard.   She has experienced several falls, often losing balance, particularly when getting out of her car or doing housework. Recent falls include one while mopping the floor and another significant fall down the stairs. No neuropathy is reported, and her endocrinologist does not suspect it either.   Urinary incontinence has been present for over a year, with increased frequency in the past two to three years. She has not been evaluated by a urologist. Occasional bowel incontinence is also reported.   She reports skin changes, describing 'fish skin' that becomes  hard and flaky, and notes her hair has become very dry and thin, with significant hair loss. She takes Advil  for pain, typically three tablets before work and two after dinner, but sometimes takes four tablets at a time.   Anxiety is managed with Lexapro , but she notes it has been 'crazy' at times recently.      Previous HPI 07/26/2023 Holly Ortiz  is a 58 y.o. 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  After the last visit she tried resuming the duloxetine  30 mg daily but felt some intermittent increased anxiety or agitation with the medicine so stopped taking it and the symptoms improved.  At least 1 episode of increased joint swelling most prominent at her right hand and the third finger this was about 3 weeks ago and resolved over the course of about 1 week.  Some itching and rashes in a few areas most persistently on the right elbow with a patch of red itchy skin this is partially improved with topical emollients. Currently she feels very poorly was started on Mounjaro  by her endocrinologist took the first dose 2 weeks ago.  Since she started this experience severe nausea loss of appetite and loose watery diarrhea with multiple small bowel movements after any solid oral intake.  Took the second dose 1 week ago and symptoms have been even worse often not even tolerating broth or other bland liquids.  Feels very drained and fatigued now.  Broke out in hives throughout her torso and worsening of skin rash on the arms for the past few days.  She says this is pretty typical reaction for her whenever she gets sick.   Previous HPI 05/03/2023 Holly Ortiz is a 58 y.o. 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  Her previously worsened widespread symptoms of fatigue did get somewhat better after our visit last month feels the duloxetine  was beneficial for joint pains also with her brain fog.  Her prescription ran out before this visit and so she came off of it and noticed some worsening in pain again.  But she suffered a fall 3 weeks ago down the stairs and hit the back of her head.  Initially did not think much of it subsequently has been feeling worsening headaches in the back of the head and pain in her neck area.  Also is  noticing numbness radiating down to the fourth and fifth fingers in both hands.  She was seen at the ED and had a CT scan with no evidence of cervical spine injury and no hemorrhage.   Previous HPI 03/12/23 Holly Ortiz is a 58 y.o. female here for follow up for UCTD on hydroxychloroquine  400 mg daily symptoms currently in exacerbation for the past 3 weeks.  She is having increased pain in multiple areas neck shoulders arms as well as the low back and tailbone region.  She is feeling persistently fatigued every day also increased headaches.  She notices some more swelling in both hands then has been present for at least the past year.  She does describe very poor sleep during these few weeks and a very high level of work stress contributing.   Previous HPI 01/25/23 Holly Ortiz is a 58 y.o. female here for follow up for UCTD with inflammatory arthritis and chronic urticaria on hydroxychloroquine  400 mg daily.  Past few months she reports worsening fatigue and concentration and short-term memory problems.  Also  increased pain especially in the low back bilaterally.  Does not have pain or numbness radiation into the legs.  She is experienced some weakness and difficulty standing back up from the floor or crouched position.  Frequently wakes up with low back pain that improves after taking 2 regular Advil  and moving around for a while.  Has not had severe outbreak of hives but keeps getting a small number of these around the neck and back of the scalp.  Follow-up with primary care office in December still had poorly controlled diabetes with hemoglobin A1c 11.8.  Skin rash behind the right knee is unchanged has a new erythematous rash in the right elbow. She has been under increased stress with work changes after her [employer] branch angry and was close now working in [place] still getting used to the change in location and coworkers.   Previous HPI 07/31/22 Holly Ortiz is a 58 y.o. female  here for follow up for UCTD with symptoms of inflammatory arthritis and chronic urticaria on HCQ 400 mg daily. She has generally been doing well without severe flare ups. She has followed up with her ophthalmologist but not remembered to specifically address her HCQ screening exam. Since last week she is having new left flank pain radiating around the side somewhat along bottom of ribs. She does not recall any preceding illness or injury. She does notice increased urinary frequency during this time. No dysuria or gross hematuria. She has a history of a kidney stone once about 30 years ago during pregnancy and last known UTI about 10 years ago. She has a new dog and has a lot of new scratches from this.   Previous HPI 01/31/2022 Holly Ortiz is a 58 y.o. female here for follow up for inflammatory arthritis and chronic urticaria on HCQ 400 mg daily. She had a significant flare up of symptoms around Thanksgiving that resolved after a couple weeks without specific intervention. She takes advil  as needed when joint pain and swelling are worse and when really bad sometimes flexeril . She had another shorter episode of symptoms for about 4 days earlier this year. Most common joint swelling lately is at the 3rd knuckle of each hand, knee swelling has been less common. She did not have many episodes of hives outside of these flares. She is starting to have nasal congestion and hoarseness of voice she thinks is seasonal allergies returning.   Previous HPI 08/02/21 Holly Ortiz is a 58 y.o. female here for follow up for inflammatory arthritis and chronic urticarial rashes on hydroxychloroquine  400 mg PO daily. Due to low back and knee pain that seemed noninflammatory also recommended trial of oral muscle relaxant as needed at night.  So far she feels a very good improvement in her overall symptoms.  She has not had any recurrence of skin rash since at least 2 months ago.  Joint pains are doing well.  She still  has some of the fatigue and concentration difficulty though this is also partially better.   02/22/21 Holly Ortiz is a 58 y.o. female here for evaluation of positive ANA with chronic joint pains, fatigue, and intermittent hives for years. She describes symptoms starting since around 2007. She has had pain in joints and muscles at various areas to some extent most of the time. She also developed a problem with recurrent urticaria rashes at multiple areas, most often after increased stress or other provocative events such as infection and antibiotic use. Since that time she has had  workup with allergy and immunology clinics and dermatology clinic with no specific cause able to be found. A more recent symptom is change in sensation in her legs and feet. She notices swelling and discomfort after prolonged standing or prolonged sitting. She had a few episodes of becoming dizzy and unstable after standing and a few falls.   Labs reviewed 01/2021 ANA 1:80 homogenous, speckled CBC wnl TSH wnl CK wnl CRP wnl   Review of Systems  Constitutional:  Positive for fatigue.  HENT:  Positive for mouth dryness. Negative for mouth sores.   Eyes:  Positive for dryness.  Respiratory:  Positive for shortness of breath.   Cardiovascular:  Positive for palpitations. Negative for chest pain.  Gastrointestinal:  Positive for constipation and diarrhea. Negative for blood in stool.  Endocrine: Negative for increased urination.  Genitourinary:  Positive for involuntary urination.  Musculoskeletal:  Positive for joint pain, gait problem, joint pain, joint swelling, myalgias, muscle weakness, morning stiffness, muscle tenderness and myalgias.  Skin:  Positive for color change, rash, hair loss and sensitivity to sunlight.  Allergic/Immunologic: Negative for susceptible to infections.  Neurological:  Positive for dizziness and headaches.  Hematological:  Positive for swollen glands.  Psychiatric/Behavioral:  Negative  for depressed mood and sleep disturbance. The patient is nervous/anxious.     PMFS History:  Patient Active Problem List   Diagnosis Date Noted   IT band syndrome 05/01/2024   Acute respiratory failure with hypoxia (HCC) 02/24/2024   Headache 05/03/2023   Other fatigue 01/25/2023   Rash and other nonspecific skin eruption 01/25/2023   Urinary frequency 08/02/2022   Left flank pain 07/31/2022   Pain in left knee 05/03/2021   Arthralgia 02/24/2021   Undifferentiated connective tissue disease (HCC) 02/22/2021   Iron  deficiency 07/07/2020   Left low back pain 01/14/2020   Anemia 12/02/2019   Lightheadedness 12/02/2019   Menorrhagia 08/07/2019   Abnormal uterine bleeding 08/07/2019   Anxiety and depression 07/03/2018   DKA (diabetic ketoacidosis) (HCC) 03/28/2018   Tachycardia 03/28/2018   Urticaria 03/28/2018   Hyperosmolar hyperglycemia 01/04/2018   AKI (acute kidney injury) (HCC) 01/04/2018   Hyperglycemia 01/03/2018   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-I)    Asthma    Type 2 diabetes mellitus with complication, with long-term current use of insulin  (HCC)    Elevated cholesterol    Essential hypertension     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-I)    Anxiety    on meds   Asthma    childhood   Cataract 2015   bilateral   Constipation    takes Miralax  daily   Depression    on meds   Diabetes mellitus    type II-on meds   Hyperlipidemia    on meds   Hypertension    on meds   Kidney stone    Lupus 2022   dx 02/2021   Seasonal allergies    Urticaria, chronic    UTI (lower urinary tract infection)     Family History  Problem Relation Age of Onset   Colon polyps Mother 3   Colon cancer Mother 17   Hypertension Mother    Diabetes Father    Hypertension Father    COPD Father        Cause of death   Hypertension Sister    Diabetes Sister    Cancer Sister         Brain  glioblastoma   Diabetes Sister    Hypertension Brother    Hyperlipidemia Other    Heart disease Other    Esophageal cancer Neg Hx    Stomach cancer Neg Hx    Rectal cancer Neg Hx    Past Surgical History:  Procedure Laterality Date   ABDOMINAL HYSTERECTOMY  08/07/2019   BREAST ENHANCEMENT SURGERY  1993   breast lift/reduction   CATARACT EXTRACTION, BILATERAL Bilateral 2014   CATARACT EXTRACTION, BILATERAL Bilateral 04/2022   COLONOSCOPY  2014   in [place]   LAPAROSCOPIC VAGINAL HYSTERECTOMY WITH SALPINGO OOPHORECTOMY Bilateral 08/07/2019   Procedure: LAPAROSCOPIC ASSISTED VAGINAL HYSTERECTOMY WITH SALPINGO OOPHORECTOMY, possible abdominal hysterectomy;  Surgeon: Thora Flint, MD;  Location: [facility] OR;  Service: Gynecology;  Laterality: Bilateral;  possible abdominal hysterectomy pt is diabetic and hypertensive   SOFT TISSUE MASS EXCISION Right 2016   [place]   TUBAL LIGATION  1995   tummy tuck  1993   Social History   Social History Narrative   Not on file   Immunization History  Administered Date(s) Administered   DTaP 07/04/2009   Influenza,inj,Quad PF,6+ Mos 01/05/2018, 08/28/2018, 09/05/2019, 10/05/2020   PFIZER(Purple Top)SARS-COV-2 Vaccination 02/03/2020, 03/02/2020   Pneumococcal Polysaccharide-23 01/05/2018   Tdap 12/04/2012   Zoster Recombinant(Shingrix) 06/29/2021     Objective: Vital Signs: BP 138/79 (BP Location: Left Arm, Patient Position: Sitting, Cuff Size: Normal)   Pulse 96   Resp 14   Ht 5\' 3"  (1.6 m)   Wt 217 lb (98.4 kg)   LMP 05/13/2019 Comment: patient had not had her period in a year and it came back in May and June 2020  BMI 38.44 kg/m    Physical Exam Constitutional:      Appearance: She is obese.  Eyes:     Conjunctiva/sclera: Conjunctivae normal.  Cardiovascular:     Rate and Rhythm: Normal rate and regular rhythm.  Pulmonary:     Effort: Pulmonary effort is normal.     Breath sounds: Normal breath sounds.  Lymphadenopathy:      Cervical: No cervical adenopathy.  Skin:    General: Skin is warm and dry.     Findings: Rash present.     Comments: Large patch of faintly erythematous skin with overlying flaking and scaly skin on right elbow extensor surface and left knee flexor surfaces Right foot and ankle warm, mildly erythematous, blanching skin patch  Neurological:     Mental Status: She is alert.  Psychiatric:        Mood and Affect: Mood normal.      Musculoskeletal Exam:  Shoulders full ROM no tenderness or swelling Elbows full ROM no tenderness or swelling Wrists full ROM no tenderness or swelling Fingers full ROM no tenderness or swelling Low back and lateral hip tenderness to pressure b/l, hip internal rotation very limited with lateral hip pain and stiffness Knees full ROM no tenderness or swelling Ankles full ROM no tenderness or swelling  Investigation: No additional findings.  Imaging: No results found.  Recent Labs: Lab Results  Component Value Date   WBC 4.6 02/25/2024   HGB 10.9 (L) 02/25/2024   PLT 262 02/25/2024   NA 137 02/25/2024   K 4.0 02/25/2024   CL 100 02/25/2024   CO2 27 02/25/2024   GLUCOSE 391 (H) 02/25/2024   BUN 27 (H) 02/25/2024   CREATININE 0.85 02/25/2024   BILITOT 0.3 02/24/2024   ALKPHOS 62 02/24/2024   AST 22 02/24/2024   ALT 24 02/24/2024  PROT 7.3 02/24/2024   ALBUMIN  3.5 02/24/2024   CALCIUM  8.8 (L) 02/25/2024   GFRAA >60 07/30/2019    Speciality Comments: PLQ Eye Exam: 10/12/2022 WNL @ [facility] Patient will schedule Eye Exam-patient to call and schedule  Procedures:  Large Joint Inj: bilateral greater trochanter on 05/01/2024 8:35 AM Indications: pain Details: 27 G 1.5 in needle, lateral approach Medications (Right): 2 mL lidocaine  1 %; 40 mg triamcinolone  acetonide 40 MG/ML Medications (Left): 2 mL lidocaine  1 %; 40 mg triamcinolone  acetonide 40 MG/ML Procedure, treatment alternatives, risks and benefits explained, specific  risks discussed. Consent was given by the patient. Immediately prior to procedure a time out was called to verify the correct patient, procedure, equipment, support staff and site/side marked as required. Patient was prepped and draped in the usual sterile fashion.     Allergies: Ozempic  (0.25 or 0.5 mg-dose) [semaglutide (0.25 or 0.5mg -dos)], Lisinopril , Metformin  and related, and Pioglitazone  hydrochloride   Assessment / Plan:     Visit Diagnoses: Undifferentiated connective tissue disease (HCC) - Plan: hydroxychloroquine  (PLAQUENIL ) 200 MG tablet, Sedimentation rate, C3 and C4, Anti-DNA antibody, double-stranded Describes overall worsening of symptoms particularly with the fatigue and joint problems mostly in hips there is no peripheral synovitis on exam today.  Does have some increase in skin rashes look most like eczema involving around the elbows and knees.  No severe exacerbation of urticarial rashes. - Recheck double-stranded ENA complements and sed rate for disease activity monitoring - Continue hydroxychloroquine  400 mg daily  Iliotibial band syndrome, unspecified laterality - Plan: diclofenac (VOLTAREN) 75 MG EC tablet Hip pain and IT band syndrome Severe hip pain with limited mobility and frequent falls due to tight IT band. Discussed benefits of hip injection and physical therapy. - Administer hip injection for immediate relief. - Recommend self-directed exercises, can refer to PT if not improving after next few weeks - Prescribe diclofenac 75 mg BID PRN for short-term anti-inflammatory treatment.  High risk medication use - hydroxychloroquine  400 mg daily, PLQ Eye Exam: 10/12/2022 WNL. Needs updated PLQ eye exam. 1 episode of interval infection with UTI and had a positive COVID test but nonsymptomatic.  Rash and other nonspecific skin eruption - Plan: triamcinolone  ointment (KENALOG ) 0.5 % New prescription for triamcinolone  0.5% ran out of the previous.  Also discussed need for  regular use of emollients on affected areas.  Urticaria - high-dose Cetirizine (Zyrtec) and Famotidine  (Pepcid ) regimen  Kidney stone Large left kidney stone causing significant pain. Discussed potential urology evaluation would be beneficial if symptomatic.   Orders: Orders Placed This Encounter  Procedures   Large Joint Inj   Sedimentation rate   C3 and C4   Anti-DNA antibody, double-stranded   Meds ordered this encounter  Medications   diclofenac (VOLTAREN) 75 MG EC tablet    Sig: Take 1 tablet (75 mg total) by mouth 2 (two) times daily as needed.    Dispense:  30 tablet    Refill:  0   triamcinolone  ointment (KENALOG ) 0.5 %    Sig: Apply 1 Application topically 2 (two) times daily as needed.    Dispense:  30 g    Refill:  1   hydroxychloroquine  (PLAQUENIL ) 200 MG tablet    Sig: Take 2 tablets (400 mg total) by mouth daily.    Dispense:  180 tablet    Refill:  0     Follow-Up Instructions: Return in about 3 months (around 08/01/2024) for SLE/hip on HCQ/NSAID/inj f/u 3mos.   Haig Levan  Belia Febo, MD  Note - This record has been created using Animal nutritionist.  Chart creation errors have been sought, but may not always  have been located. Such creation errors do not reflect on  the standard of medical care.

## 2024-04-20 ENCOUNTER — Other Ambulatory Visit: Payer: Self-pay | Admitting: Internal Medicine

## 2024-04-20 DIAGNOSIS — M359 Systemic involvement of connective tissue, unspecified: Secondary | ICD-10-CM

## 2024-04-21 NOTE — Telephone Encounter (Signed)
 Last Fill: 02/18/2024  Eye exam: 10/12/2022 WNL   Labs: 02/25/2024 CBC and BMP Glucose 391 BUN 27 Calcium  8.8 Hemoglobin 10.9 HCT 35.8 MCH 25.5  Next Visit: 05/01/2024  Last Visit: 01/28/2024  [id number] connective tissue disease (HCC)   Current Dose per office note 01/28/2024: hydroxychloroquine  400 mg daily   Attempted to contact the patient and left a message to call the office back regarding the PLQ eye exam.   Okay to refill Plaquenil ?

## 2024-05-01 ENCOUNTER — Encounter: Payer: Self-pay | Admitting: Internal Medicine

## 2024-05-01 ENCOUNTER — Ambulatory Visit: Payer: 59 | Attending: Internal Medicine | Admitting: Internal Medicine

## 2024-05-01 VITALS — BP 138/79 | HR 96 | Resp 14 | Ht 63.0 in | Wt 217.0 lb

## 2024-05-01 DIAGNOSIS — M2550 Pain in unspecified joint: Secondary | ICD-10-CM

## 2024-05-01 DIAGNOSIS — M359 Systemic involvement of connective tissue, unspecified: Secondary | ICD-10-CM | POA: Diagnosis not present

## 2024-05-01 DIAGNOSIS — Z79899 Other long term (current) drug therapy: Secondary | ICD-10-CM

## 2024-05-01 DIAGNOSIS — M7630 Iliotibial band syndrome, unspecified leg: Secondary | ICD-10-CM | POA: Diagnosis not present

## 2024-05-01 DIAGNOSIS — L509 Urticaria, unspecified: Secondary | ICD-10-CM | POA: Diagnosis not present

## 2024-05-01 DIAGNOSIS — R21 Rash and other nonspecific skin eruption: Secondary | ICD-10-CM | POA: Diagnosis not present

## 2024-05-01 DIAGNOSIS — R350 Frequency of micturition: Secondary | ICD-10-CM

## 2024-05-01 MED ORDER — LIDOCAINE HCL 1 % IJ SOLN
2.0000 mL | INTRAMUSCULAR | Status: AC | PRN
  Administered 2024-05-01: 2 mL

## 2024-05-01 MED ORDER — HYDROXYCHLOROQUINE SULFATE 200 MG PO TABS: 400.0000 mg | ORAL_TABLET | Freq: Every day | ORAL | 0 refills | Status: DC

## 2024-05-01 MED ORDER — DICLOFENAC SODIUM 75 MG PO TBEC: 75.0000 mg | DELAYED_RELEASE_TABLET | Freq: Two times a day (BID) | ORAL | 0 refills | Status: DC | PRN

## 2024-05-01 MED ORDER — TRIAMCINOLONE ACETONIDE 40 MG/ML IJ SUSP
40.0000 mg | INTRAMUSCULAR | Status: AC | PRN
  Administered 2024-05-01: 40 mg via INTRA_ARTICULAR

## 2024-05-01 MED ORDER — TRIAMCINOLONE ACETONIDE 40 MG/ML IJ SUSP
40.0000 mg | INTRAMUSCULAR | Status: AC | PRN
Start: 2024-05-01 — End: 2024-05-01
  Administered 2024-05-01: 40 mg via INTRA_ARTICULAR

## 2024-05-01 MED ORDER — TRIAMCINOLONE ACETONIDE 0.5 % EX OINT: 1.0000 | TOPICAL_OINTMENT | Freq: Two times a day (BID) | CUTANEOUS | 1 refills | Status: DC | PRN

## 2024-05-12 ENCOUNTER — Ambulatory Visit (INDEPENDENT_AMBULATORY_CARE_PROVIDER_SITE_OTHER): Admitting: Family Medicine

## 2024-05-12 ENCOUNTER — Encounter: Payer: Self-pay | Admitting: Family Medicine

## 2024-05-12 VITALS — BP 126/78 | HR 91 | Temp 96.6°F | Ht 63.0 in | Wt 219.0 lb

## 2024-05-12 DIAGNOSIS — E7800 Pure hypercholesterolemia, unspecified: Secondary | ICD-10-CM | POA: Diagnosis not present

## 2024-05-12 DIAGNOSIS — D638 Anemia in other chronic diseases classified elsewhere: Secondary | ICD-10-CM

## 2024-05-12 DIAGNOSIS — E538 Deficiency of other specified B group vitamins: Secondary | ICD-10-CM

## 2024-05-12 DIAGNOSIS — R8290 Unspecified abnormal findings in urine: Secondary | ICD-10-CM

## 2024-05-12 DIAGNOSIS — L509 Urticaria, unspecified: Secondary | ICD-10-CM | POA: Diagnosis not present

## 2024-05-12 DIAGNOSIS — I10 Essential (primary) hypertension: Secondary | ICD-10-CM | POA: Diagnosis not present

## 2024-05-12 DIAGNOSIS — F32A Depression, unspecified: Secondary | ICD-10-CM

## 2024-05-12 DIAGNOSIS — F419 Anxiety disorder, unspecified: Secondary | ICD-10-CM | POA: Diagnosis not present

## 2024-05-12 LAB — URINALYSIS, ROUTINE W REFLEX MICROSCOPIC
Bilirubin Urine: NEGATIVE
Hgb urine dipstick: NEGATIVE
Ketones, ur: NEGATIVE
Nitrite: POSITIVE — AB
Specific Gravity, Urine: 1.02 (ref 1.000–1.030)
Total Protein, Urine: NEGATIVE
Urine Glucose: NEGATIVE
Urobilinogen, UA: 0.2 (ref 0.0–1.0)
pH: 6.5 (ref 5.0–8.0)

## 2024-05-12 LAB — COMPREHENSIVE METABOLIC PANEL WITH GFR
ALT: 15 U/L (ref 0–35)
AST: 12 U/L (ref 0–37)
Albumin: 3.9 g/dL (ref 3.5–5.2)
Alkaline Phosphatase: 60 U/L (ref 39–117)
BUN: 28 mg/dL — ABNORMAL HIGH (ref 6–23)
CO2: 32 meq/L (ref 19–32)
Calcium: 9.1 mg/dL (ref 8.4–10.5)
Chloride: 101 meq/L (ref 96–112)
Creatinine, Ser: 0.92 mg/dL (ref 0.40–1.20)
GFR: 69.2 mL/min (ref >60.00)
Glucose, Bld: 182 mg/dL — ABNORMAL HIGH (ref 70–99)
Potassium: 4.1 meq/L (ref 3.5–5.1)
Sodium: 139 meq/L (ref 135–145)
Total Bilirubin: 0.3 mg/dL (ref 0.2–1.2)
Total Protein: 7 g/dL (ref 6.0–8.3)

## 2024-05-12 LAB — CBC
HCT: 32 % — ABNORMAL LOW (ref 36.0–46.0)
Hemoglobin: 10.2 g/dL — ABNORMAL LOW (ref 12.0–15.0)
MCHC: 32 g/dL (ref 30.0–36.0)
MCV: 80.3 fl (ref 78.0–100.0)
Platelets: 383 10*3/uL (ref 150.0–400.0)
RBC: 3.98 Mil/uL (ref 3.87–5.11)
RDW: 15.2 % (ref 11.5–15.5)
WBC: 10.5 10*3/uL (ref 4.0–10.5)

## 2024-05-12 LAB — LDL CHOLESTEROL, DIRECT: Direct LDL: 70 mg/dL

## 2024-05-12 NOTE — Progress Notes (Addendum)
 Established Patient Office Visit   Subjective:  Patient ID: Holly Ortiz, female    DOB: 02-26-1966  Age: 58 y.o. MRN: [id number]  Chief Complaint  Patient presents with   Medical Management of Chronic Issues    Follow up. Pt is not fasting. Refused vaccines. Due for pap, eye exam, foot exam.     HPI Encounter Diagnoses  Name Primary?   Essential hypertension Yes   Anxiety and depression    Elevated cholesterol    Urticaria    B12 deficiency    Abnormal urinalysis    Anemia of chronic disease    For follow-up of above.  Nonfasting today.  Exercising is difficult for her secondary to lupus.  Recent lupus exacerbation.  Continues amlodipine  and Maxide for well-controlled blood pressure.  Continues fenofibrate  and atorvastatin  for elevated cholesterol.  Continues montelukast  without issue for chronic urticaria.  History of an may have chronic disease associated with lupus.  Colonoscopy up-to-date.  Continues with high-dose daily B12 for B12 deficiency.  Seeing GYN for well woman care.  Endocrinology has been checking her feet.  She is here with her husband.   Review of Systems  Constitutional: Negative.   HENT: Negative.    Eyes:  Negative for blurred vision, discharge and redness.  Respiratory: Negative.    Cardiovascular: Negative.   Gastrointestinal:  Negative for abdominal pain.  Genitourinary: Negative.   Musculoskeletal: Negative.  Negative for myalgias.  Skin:  Negative for rash.  Neurological:  Negative for tingling, loss of consciousness and weakness.  Endo/Heme/Allergies:  Negative for polydipsia.     Current Outpatient Medications:    Accu-Chek Softclix Lancets lancets, 1 each 3 (three) times daily., Disp: , Rfl:    acetaminophen  (TYLENOL ) 500 MG tablet, Take 2 tablets (1,000 mg total) by mouth every 6 (six) hours as needed. (Patient taking differently: Take 1,000 mg by mouth every 6 (six) hours as needed for mild pain (pain score 1-3) or headache.), Disp: 60  tablet, Rfl: 0   albuterol  (VENTOLIN  HFA) 108 (90 Base) MCG/ACT inhaler, Inhale 2 puffs into the lungs every 6 (six) hours as needed for wheezing or shortness of breath., Disp: 8 g, Rfl: 0   amLODipine  (NORVASC ) 10 MG tablet, Take 1 tablet (10 mg total) by mouth daily., Disp: 90 tablet, Rfl: 3   atorvastatin  (LIPITOR) 40 MG tablet, Take 1 tablet (40 mg total) by mouth daily., Disp: 90 tablet, Rfl: 2   Blood Glucose Monitoring Suppl DEVI, 1 each by Does not apply route in the morning, at noon, and at bedtime. May substitute to any manufacturer covered by patient's insurance., Disp: 1 each, Rfl: 0   Cholecalciferol (VITAMIN D3) 50 MCG (2000 UT) TABS, Take 2,000 Units by mouth daily., Disp: , Rfl:    cyclobenzaprine  (FLEXERIL ) 5 MG tablet, TAKE 1 TABLET BY MOUTH AT BEDTIME AS NEEDED FOR MUSCLE SPASMS., Disp: 30 tablet, Rfl: 1   diclofenac  (VOLTAREN ) 75 MG EC tablet, Take 1 tablet (75 mg total) by mouth 2 (two) times daily as needed., Disp: 30 tablet, Rfl: 0   escitalopram  (LEXAPRO ) 20 MG tablet, Take 1 tablet (20 mg total) by mouth daily., Disp: 90 tablet, Rfl: 3   fenofibrate  (TRICOR ) 145 MG tablet, Take 1 tablet (145 mg total) by mouth daily., Disp: 90 tablet, Rfl: 1   glimepiride  (AMARYL ) 4 MG tablet, Take 1 tablet (4 mg total) by mouth daily before breakfast., Disp: 90 tablet, Rfl: 3   Glucose Blood (BLOOD GLUCOSE TEST STRIPS) STRP, 1 each  by In Vitro route in the morning, at noon, and at bedtime. May substitute to any manufacturer covered by patient's insurance., Disp: 100 each, Rfl: 3   hydroxychloroquine  (PLAQUENIL ) 200 MG tablet, Take 2 tablets (400 mg total) by mouth daily., Disp: 180 tablet, Rfl: 0   Insulin  Pen Needle (BD PEN NEEDLE NANO 2ND GEN) 32G X 4 MM MISC, Use four times daily to inject insulin ., Disp: 200 each, Rfl: 11   insulin  regular human CONCENTRATED (HUMULIN  R U-500 KWIKPEN) 500 UNIT/ML KwikPen, INJECT 40 UNITS UNDER THE SKIN BEFORE BREAKFAST, 20-25 UNITS BEFORE LUNCH AND 40 UNITS  AT SUPPER. (Patient taking differently: Inject 30-60 Units into the skin See admin instructions. Inject 60 units into the skin before breakfast, 30 units before lunch, 60 units before supper, and 30 units at bedtime), Disp: 18 mL, Rfl: 6   Melatonin 5 MG TABS, Take 5 mg by mouth at bedtime as needed (FOR SLEEP)., Disp: , Rfl:    MIEBO 1.338 GM/ML SOLN, Place 1 drop into both eyes 2 (two) times daily., Disp: , Rfl:    montelukast  (SINGULAIR ) 10 MG tablet, TAKE 1 TABLET BY MOUTH EVERYDAY AT BEDTIME (Patient taking differently: Take 10 mg by mouth in the morning.), Disp: 90 tablet, Rfl: 1   phenazopyridine  (PYRIDIUM ) 200 MG tablet, Take 1 tablet (200 mg total) by mouth 3 (three) times daily. (Patient taking differently: Take 200 mg by mouth 3 (three) times daily as needed for pain.), Disp: 6 tablet, Rfl: 0   polyethylene glycol (MIRALAX  / GLYCOLAX ) 17 g packet, Take 17 g by mouth daily as needed for mild constipation., Disp: , Rfl:    triamcinolone  ointment (KENALOG ) 0.5 %, Apply 1 Application topically 2 (two) times daily as needed., Disp: 30 g, Rfl: 1   triamterene -hydrochlorothiazide  (MAXZIDE -25) 37.5-25 MG tablet, TAKE 1 TABLET BY MOUTH EVERY DAY, Disp: 90 tablet, Rfl: 1   vitamin B-12 (CYANOCOBALAMIN ) 1000 MCG tablet, Take 1,000 mcg by mouth daily., Disp: , Rfl:    zinc gluconate 50 MG tablet, Take 50 mg by mouth daily. , Disp: , Rfl:   Current Facility-Administered Medications:    0.9 %  sodium chloride  infusion, 500 mL, Intravenous, Once, Cunningham, Glendia BRAVO, MD   Objective:     BP 126/78 (Cuff Size: Large)   Pulse 91   Temp (!) 96.6 F (35.9 C) (Temporal)   Ht 5' 3 (1.6 m)   Wt 219 lb (99.3 kg)   LMP 05/13/2019 Comment: patient had not had her period in a year and it came back in May and June 2020  SpO2 94%   BMI 38.79 kg/m  BP Readings from Last 3 Encounters:  05/12/24 126/78  05/01/24 138/79  02/25/24 (!) 159/69   Wt Readings from Last 3 Encounters:  05/12/24 219 lb (99.3 kg)   05/01/24 217 lb (98.4 kg)  02/24/24 208 lb 5.4 oz (94.5 kg)      Physical Exam Constitutional:      General: She is not in acute distress.    Appearance: Normal appearance. She is not ill-appearing, toxic-appearing or diaphoretic.  HENT:     Head: Normocephalic and atraumatic.     Right Ear: External ear normal.     Left Ear: External ear normal.     Mouth/Throat:     Mouth: Mucous membranes are moist.     Pharynx: Oropharynx is clear. No oropharyngeal exudate or posterior oropharyngeal erythema.   Eyes:     General: No scleral icterus.  Right eye: No discharge.        Left eye: No discharge.     Extraocular Movements: Extraocular movements intact.     Conjunctiva/sclera: Conjunctivae normal.     Pupils: Pupils are equal, round, and reactive to light.    Cardiovascular:     Rate and Rhythm: Normal rate and regular rhythm.  Pulmonary:     Effort: Pulmonary effort is normal. No respiratory distress.     Breath sounds: Normal breath sounds. No wheezing, rhonchi or rales.   Musculoskeletal:     Cervical back: No rigidity or tenderness.  Lymphadenopathy:     Cervical: No cervical adenopathy.   Skin:    General: Skin is warm and dry.   Neurological:     Mental Status: She is alert and oriented to person, place, and time.   Psychiatric:        Mood and Affect: Mood normal.        Behavior: Behavior normal.    Diabetic Foot Exam - Simple   No data filed      Results for orders placed or performed in visit on 05/12/24  CBC  Result Value Ref Range   WBC 10.5 4.0 - 10.5 K/uL   RBC 3.98 3.87 - 5.11 Mil/uL   Platelets 383.0 150.0 - 400.0 K/uL   Hemoglobin 10.2 (L) 12.0 - 15.0 g/dL   HCT 67.9 (L) 63.9 - 53.9 %   MCV 80.3 78.0 - 100.0 fl   MCHC 32.0 30.0 - 36.0 g/dL   RDW 84.7 88.4 - 84.4 %  Comprehensive metabolic panel with GFR  Result Value Ref Range   Sodium 139 135 - 145 mEq/L   Potassium 4.1 3.5 - 5.1 mEq/L   Chloride 101 96 - 112 mEq/L   CO2 32 19 -  32 mEq/L   Glucose, Bld 182 (H) 70 - 99 mg/dL   BUN 28 (H) 6 - 23 mg/dL   Creatinine, Ser 9.07 0.40 - 1.20 mg/dL   Total Bilirubin 0.3 0.2 - 1.2 mg/dL   Alkaline Phosphatase 60 39 - 117 U/L   AST 12 0 - 37 U/L   ALT 15 0 - 35 U/L   Total Protein 7.0 6.0 - 8.3 g/dL   Albumin  3.9 3.5 - 5.2 g/dL   GFR 30.79 >39.99 mL/min   Calcium  9.1 8.4 - 10.5 mg/dL  TSH  Result Value Ref Range   TSH 1.41 0.35 - 5.50 uIU/mL  Urinalysis, Routine w reflex microscopic  Result Value Ref Range   Color, Urine YELLOW Yellow;Lt. Yellow;Straw;Dark Yellow;Amber;Green;Red;Brown   APPearance Sl Cloudy (A) Clear;Turbid;Slightly Cloudy;Cloudy   Specific Gravity, Urine 1.020 1.000 - 1.030   pH 6.5 5.0 - 8.0   Total Protein, Urine NEGATIVE Negative   Urine Glucose NEGATIVE Negative   Ketones, ur NEGATIVE Negative   Bilirubin Urine NEGATIVE Negative   Hgb urine dipstick NEGATIVE Negative   Urobilinogen, UA 0.2 0.0 - 1.0   Leukocytes,Ua SMALL (A) Negative   Nitrite POSITIVE (A) Negative   WBC, UA 11-20/hpf (A) 0-2/hpf   RBC / HPF 0-2/hpf 0-2/hpf   Mucus, UA Presence of (A) None   Squamous Epithelial / HPF Rare(0-4/hpf) Rare(0-4/hpf)   Bacteria, UA Few(10-50/hpf) (A) None  LDL cholesterol, direct  Result Value Ref Range   Direct LDL 70.0 mg/dL      The 89-bzjm ASCVD risk score (Arnett DK, et al., 2019) is: 6.5%    Assessment & Plan:   Essential hypertension -     Comprehensive  metabolic panel with GFR -     Urinalysis, Routine w reflex microscopic  Anxiety and depression -     TSH  Elevated cholesterol -     Comprehensive metabolic panel with GFR -     LDL cholesterol, direct  Urticaria  B12 deficiency -     Vitamin B12  Abnormal urinalysis -     Urine Culture; Future  Anemia of chronic disease -     CBC    Return in about 1 year (around 05/12/2025), or if symptoms worsen or fail to improve.  Advised patient to have the Tdap and Prevnar 20.  She declines.  Encouraged regular exercise  to the extent that she is able.  She will schedule follow-up with her GYN for well woman care.  She will schedule an eye check through her ophthalmologist for herself and her husband.  Elsie Sim Lent, MD

## 2024-05-15 ENCOUNTER — Other Ambulatory Visit: Payer: Self-pay | Admitting: Family Medicine

## 2024-05-15 DIAGNOSIS — Z1231 Encounter for screening mammogram for malignant neoplasm of breast: Secondary | ICD-10-CM

## 2024-05-15 LAB — TSH: TSH: 1.41 u[IU]/mL (ref 0.35–5.50)

## 2024-05-19 ENCOUNTER — Other Ambulatory Visit: Payer: Self-pay

## 2024-05-19 ENCOUNTER — Ambulatory Visit: Payer: Self-pay | Admitting: Family Medicine

## 2024-05-19 DIAGNOSIS — E538 Deficiency of other specified B group vitamins: Secondary | ICD-10-CM

## 2024-05-19 LAB — C3 AND C4
C3 Complement: 182 mg/dL (ref 83–193)
C4 Complement: 44 mg/dL (ref 15–57)

## 2024-05-19 LAB — SEDIMENTATION RATE: Sed Rate: 55 mm/h — ABNORMAL HIGH (ref 0–30)

## 2024-05-19 LAB — ANTI-DNA ANTIBODY, DOUBLE-STRANDED

## 2024-05-19 NOTE — Addendum Note (Signed)
 Addended by: Delene Feinstein on: 05/19/2024 12:28 PM   Modules accepted: Orders

## 2024-05-20 ENCOUNTER — Ambulatory Visit (INDEPENDENT_AMBULATORY_CARE_PROVIDER_SITE_OTHER): Admitting: Endocrinology

## 2024-05-20 ENCOUNTER — Ambulatory Visit
Admission: RE | Admit: 2024-05-20 | Discharge: 2024-05-20 | Disposition: A | Discharge status: Home / Self Care | Source: Ambulatory Visit | Attending: Family Medicine | Admitting: Family Medicine

## 2024-05-20 ENCOUNTER — Ambulatory Visit: Payer: Self-pay | Admitting: Endocrinology

## 2024-05-20 ENCOUNTER — Encounter: Payer: Self-pay | Admitting: Endocrinology

## 2024-05-20 VITALS — BP 132/70 | HR 92 | Resp 16 | Ht 63.0 in | Wt 220.0 lb

## 2024-05-20 DIAGNOSIS — Z794 Long term (current) use of insulin: Secondary | ICD-10-CM

## 2024-05-20 DIAGNOSIS — E1165 Type 2 diabetes mellitus with hyperglycemia: Secondary | ICD-10-CM

## 2024-05-20 DIAGNOSIS — Z1231 Encounter for screening mammogram for malignant neoplasm of breast: Secondary | ICD-10-CM

## 2024-05-20 LAB — POCT GLYCOSYLATED HEMOGLOBIN (HGB A1C): Hemoglobin A1C: 10.2 % — AB (ref 4.0–5.6)

## 2024-05-20 MED ORDER — HUMULIN R U-500 KWIKPEN 500 UNIT/ML ~~LOC~~ SOPN: PEN_INJECTOR | SUBCUTANEOUS | 6 refills | Status: AC

## 2024-05-20 MED ORDER — GLIMEPIRIDE 4 MG PO TABS
6.0000 mg | ORAL_TABLET | Freq: Every day | ORAL | 3 refills | Status: DC
Start: 2024-05-20 — End: 2024-09-16

## 2024-05-20 NOTE — Patient Instructions (Addendum)
  Humulin  U500, adjust as take + 5 units more than what you are doing now, take 30 minutes before eating.   Increase glimepiride  6 mg daily.

## 2024-05-20 NOTE — Progress Notes (Signed)
 Outpatient Endocrinology Note [place] Saahir Prude, MD  05/20/24  Patient Name: Holly Ortiz   DOB : 09-02-1966 MRN # [id number]                                                    REASON OF VISIT: Follow up of type 2 diabetes mellitus  PCP: Tonna Frederic, MD  HISTORY OF PRESENT ILLNESS:   Holly Ortiz is a 58 y.o. old female with past medical history listed below, is here for follow up of type 2 diabetes mellitus.   Pertinent Diabetes History: Patient was previously seen by Dr. Hubert Madden and was last time seen in August 2022. _Diagnosed as type 2 at the age of 27 years.  She was initially treated with oral antidiabetic medications including metformin .  Insulin  therapy was started around 2015 timeframe.  Patient has uncontrolled type 2 diabetes mellitus.    Chronic Diabetes Complications : Retinopathy: yes, no records available unknown. Last ophthalmology exam was done on Due, reports following regularly. Nephropathy: no Peripheral neuropathy: no Coronary artery disease: no Stroke: no  Relevant comorbidities and cardiovascular risk factors: Obesity: yes Body mass index is 38.97 kg/m.  Hypertension: yes Hyperlipidemia. Yes, on a statin.  Current / Home Diabetic regimen includes:  Humulin  U500 :30-50 units with breakfast, 0-30 units with lunch (mostly does not take) and 40 -60 units with supper, 20-40 units at bedtime. Glimepiride  4mg  daily.  Prior diabetic medications: Ozempic  is stopped due to nausea and vomiting. Metformin  is stopped due to GI intolerance. She used to be on Amaryl  and Januvia  in the past. Mounjaro  : Was tried after visit in August 2024, and stopped due to nausea vomiting and diarrhea. Farxiga  was stopped in March 2025 due to UTI.  Glycemic data:   Accu-Chek guide glucometer download from June 22 May 20, 2024 average blood sugar 238.  Highest blood sugar 427, lowest blood sugar 103.  She has been checking blood sugars 1-2 times a day, in the morning  fasting and at bedtime and occasionally before supper.  Occasionally blood sugar in low 100, mostly blood sugar in 200s and sometimes up to 300-400 range.  She declined CGM in the past.  Hypoglycemia: Patient has no hypoglycemic episodes. Patient has hypoglycemia awareness.  Factors modifying glucose control: 1.  Diabetic diet assessment: Typically eats large breakfast and dinner and smaller portion of lunch.  Occasionally eats bedtime snack usually yogurts. Food includes rice / pasta etc.  2.  Staying active or exercising: No formal exercise.  3.  Medication compliance: compliant some of the time.  Interval history  Hemoglobin A1c improved to 10.2% from 12%, congratulated her.  Diabetes regimen is reviewed and noted above.  She adjusts her dose of Humulin  U-500 based on meal size and blood sugar around that time.  She mostly does not take insulin  at low-dose as she is quite busy at goal.  She is liking glimepiride  and thinks it is helping.  She does not take Farxiga  anymore.  She reports she had a kidney stone and going to follow-up with urology.  She was treated for UTI in March.  No other complaints today.  REVIEW OF SYSTEMS As per history of present illness.   PAST MEDICAL HISTORY: 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-I)    Anxiety  on meds   Asthma    childhood   Cataract 2015   bilateral   Constipation    takes Miralax  daily   Depression    on meds   Diabetes mellitus    type II-on meds   Hyperlipidemia    on meds   Hypertension    on meds   Kidney stone    Lupus 2022   dx 02/2021   Seasonal allergies    Urticaria, chronic    UTI (lower urinary tract infection)     PAST SURGICAL HISTORY: Past Surgical History:  Procedure Laterality Date   ABDOMINAL HYSTERECTOMY  08/07/2019   BREAST ENHANCEMENT SURGERY  1993   breast lift/reduction   CATARACT EXTRACTION, BILATERAL Bilateral 2014    CATARACT EXTRACTION, BILATERAL Bilateral 04/2022   COLONOSCOPY  2014   in [place]   LAPAROSCOPIC VAGINAL HYSTERECTOMY WITH SALPINGO OOPHORECTOMY Bilateral 08/07/2019   Procedure: LAPAROSCOPIC ASSISTED VAGINAL HYSTERECTOMY WITH SALPINGO OOPHORECTOMY, possible abdominal hysterectomy;  Surgeon: Thora Flint, MD;  Location[facility] OR;  Service: Gynecology;  Laterality: Bilateral;  possible abdominal hysterectomy pt is diabetic and hypertensive   SOFT TISSUE MASS EXCISION Right 2016   [place]   TUBAL LIGATION  1995   tummy tuck  1993    ALLERGIES: Allergies  Allergen Reactions   Ozempic  (0.25 Or 0.5 Mg-Dose) [Semaglutide (0.25 Or 0.5mg -Dos)] Nausea And Vomiting   Lisinopril  Swelling and Other (See Comments)    Angioedema, including likely GI involvement   Metformin  And Related Diarrhea and Nausea And Vomiting   Pioglitazone  Hydrochloride Other (See Comments)    Reaction not stated    FAMILY HISTORY:  Family History  Problem Relation Age of Onset   Colon polyps Mother 18   Colon cancer Mother 54   Hypertension Mother    Diabetes Father    Hypertension Father    COPD Father        Cause of death   Hypertension Sister    Diabetes Sister    Cancer Sister        Brain glioblastoma   Diabetes Sister    Hypertension Brother    Hyperlipidemia Other    Heart disease Other    Esophageal cancer Neg Hx    Stomach cancer Neg Hx    Rectal cancer Neg Hx     SOCIAL HISTORY: Social History   Socioeconomic History   Marital status: Married    Spouse name: Merrily Able   Number of children: 4   Years of education: 13--1.5 years of college   Highest education level: Some college, no degree  Occupational History   Occupation: banking  Tobacco Use   Smoking status: Never    Passive exposure: Never   Smokeless tobacco: Never  Vaping Use   Vaping status: Never Used  Substance and Sexual Activity   Alcohol use: No   Drug use: No   Sexual activity: Yes    Birth control/protection: Surgical     Comment: BTL  Other Topics Concern   Not on file  Social History Narrative   Not on file   Social Drivers of Health   Financial Resource Strain: Low Risk  (10/25/2023)   Overall Financial Resource Strain (CARDIA)    Difficulty of Paying Living Expenses: Not very hard  Food Insecurity: No Food Insecurity (02/24/2024)   Hunger Vital Sign    Worried About Running Out of Food in the Last Year: Never true    Ran Out of Food in the Last Year: Never true  Transportation Needs: No Transportation Needs (02/24/2024)   PRAPARE - Administrator, Civil Service (Medical): No    Lack of Transportation (Non-Medical): No  Physical Activity: Unknown (10/25/2023)   Exercise Vital Sign    Days of Exercise per Week: 0 days    Minutes of Exercise per Session: Not on file  Stress: Patient Declined (10/25/2023)   [employer] of Occupational Health - Occupational Stress Questionnaire    Feeling of Stress : Patient declined  Social Connections: Moderately Integrated (05/12/2024)   Social Connection and Isolation Panel    Frequency of Communication with Friends and Family: More than three times a week    Frequency of Social Gatherings with Friends and Family: Three times a week    Attends Religious Services: More than 4 times per year    Active Member of Clubs or Organizations: No    Attends Engineer, structural: Not on file    Marital Status: Married    MEDICATIONS:  Current Outpatient Medications  Medication Sig Dispense Refill   Accu-Chek Softclix Lancets lancets 1 each 3 (three) times daily.     acetaminophen  (TYLENOL ) 500 MG tablet Take 2 tablets (1,000 mg total) by mouth every 6 (six) hours as needed. 60 tablet 0   albuterol  (VENTOLIN  HFA) 108 (90 Base) MCG/ACT inhaler Inhale 2 puffs into the lungs every 6 (six) hours as needed for wheezing or shortness of breath. 8 g 0   amLODipine  (NORVASC ) 10 MG tablet Take 1 tablet (10 mg total) by mouth daily. 90 tablet 3    atorvastatin  (LIPITOR) 40 MG tablet Take 1 tablet (40 mg total) by mouth daily. 90 tablet 2   Blood Glucose Monitoring Suppl DEVI 1 each by Does not apply route in the morning, at noon, and at bedtime. May substitute to any manufacturer covered by patient's insurance. 1 each 0   Cholecalciferol (VITAMIN D3) 50 MCG (2000 UT) TABS Take 2,000 Units by mouth daily.     cyclobenzaprine  (FLEXERIL ) 5 MG tablet TAKE 1 TABLET BY MOUTH AT BEDTIME AS NEEDED FOR MUSCLE SPASMS. 30 tablet 1   diclofenac  (VOLTAREN ) 75 MG EC tablet Take 1 tablet (75 mg total) by mouth 2 (two) times daily as needed. 30 tablet 0   escitalopram  (LEXAPRO ) 20 MG tablet Take 1 tablet (20 mg total) by mouth daily. 90 tablet 3   fenofibrate  (TRICOR ) 145 MG tablet Take 1 tablet (145 mg total) by mouth daily. 90 tablet 1   Glucose Blood (BLOOD GLUCOSE TEST STRIPS) STRP 1 each by In Vitro route in the morning, at noon, and at bedtime. May substitute to any manufacturer covered by patient's insurance. 100 each 3   hydroxychloroquine  (PLAQUENIL ) 200 MG tablet Take 2 tablets (400 mg total) by mouth daily. 180 tablet 0   Insulin  Pen Needle (BD PEN NEEDLE NANO 2ND GEN) 32G X 4 MM MISC Use four times daily to inject insulin . 200 each 11   Melatonin 5 MG TABS Take 5 mg by mouth at bedtime as needed (FOR SLEEP).     MIEBO 1.338 GM/ML SOLN Place 1 drop into both eyes 2 (two) times daily.     montelukast  (SINGULAIR ) 10 MG tablet TAKE 1 TABLET BY MOUTH EVERYDAY AT BEDTIME 90 tablet 1   phenazopyridine  (PYRIDIUM ) 200 MG tablet Take 1 tablet (200 mg total) by mouth 3 (three) times daily. 6 tablet 0   polyethylene glycol (MIRALAX  / GLYCOLAX ) 17 g packet Take 17 g by mouth daily as needed for  mild constipation.     triamcinolone  ointment (KENALOG ) 0.5 % Apply 1 Application topically 2 (two) times daily as needed. 30 g 1   triamterene -hydrochlorothiazide  (MAXZIDE -25) 37.5-25 MG tablet TAKE 1 TABLET BY MOUTH EVERY DAY 90 tablet 1   vitamin B-12  (CYANOCOBALAMIN) 1000 MCG tablet Take 1,000 mcg by mouth daily.     zinc gluconate 50 MG tablet Take 50 mg by mouth daily.      glimepiride  (AMARYL ) 4 MG tablet Take 1.5 tablets (6 mg total) by mouth daily before breakfast. 270 tablet 3   insulin  regular human CONCENTRATED (HUMULIN  R U-500 KWIKPEN) 500 UNIT/ML KwikPen INJECT 40 UNITS UNDER THE SKIN BEFORE BREAKFAST, 20-25 UNITS BEFORE LUNCH AND 40 - 60 UNITS AT SUPPER. Maximum 125 units/day. 18 mL 6   Current Facility-Administered Medications  Medication Dose Route Frequency Provider Last Rate Last Admin   0.9 %  sodium chloride  infusion  500 mL Intravenous Once Elois Hair, MD        PHYSICAL EXAM: Vitals:   05/20/24 0817  BP: 132/70  Pulse: 92  Resp: 16  SpO2: 97%  Weight: 220 lb (99.8 kg)  Height: 5' 3 (1.6 m)     Body mass index is 38.97 kg/m.  Wt Readings from Last 3 Encounters:  05/20/24 220 lb (99.8 kg)  05/12/24 219 lb (99.3 kg)  05/01/24 217 lb (98.4 kg)    General: Well developed, well nourished female in no apparent distress.  HEENT: AT/[place], no external lesions.  Eyes: Conjunctiva clear and no icterus. Neck: Neck supple  Lungs: Respirations not labored Neurologic: Alert, oriented, normal speech Extremities / Skin: Dry. No sores or rashes noted.  Psychiatric: Does not appear depressed or anxious  Diabetic Foot Exam - Simple   Simple Foot Form Diabetic Foot exam was performed with the following findings: Yes 05/20/2024  8:40 AM  Visual Inspection No deformities, no ulcerations, no other skin breakdown bilaterally: Yes See comments: Yes Sensation Testing Intact to touch and monofilament testing bilaterally: Yes Pulse Check Posterior Tibialis and Dorsalis pulse intact bilaterally: Yes Comments Right foot callus on medical great toe.    LABS Reviewed Lab Results  Component Value Date   HGBA1C 10.2 (A) 05/20/2024   HGBA1C 12.0 (A) 02/12/2024   HGBA1C 12.0 (A) 11/12/2023   Lab Results  Component  Value Date   FRUCTOSAMINE 307 (H) 04/14/2021   FRUCTOSAMINE 314 (H) 09/30/2020   FRUCTOSAMINE 408 (H) 04/09/2020   Lab Results  Component Value Date   CHOL 172 04/24/2023   HDL 42.10 04/24/2023   LDLCALC 85 07/11/2021   LDLDIRECT 70.0 05/12/2024   TRIG 250.0 (H) 04/24/2023   CHOLHDL 4 04/24/2023   Lab Results  Component Value Date   MICRALBCREAT 67 (H) 02/12/2024   MICRALBCREAT 3.8 10/02/2022   Lab Results  Component Value Date   CREATININE 0.92 05/12/2024   Lab Results  Component Value Date   GFR 69.20 05/12/2024    ASSESSMENT / PLAN  1. Uncontrolled type 2 diabetes mellitus with hyperglycemia, with long-term current use of insulin  (HCC)    Diabetes Mellitus type 2 poorly controlled.  Diabetic retinopathy. - Diabetic status / severity: Uncontrolled.  Improving.  Lab Results  Component Value Date   HGBA1C 10.2 (A) 05/20/2024    - Hemoglobin A1c goal : <6.5%  Discussed about diabetes mellitus and potential chronic complications.  Discussed about importance of controlling blood sugar to prevent chronic complications.  Discussed about compliance with diabetic medication including insulin .   She  was not able to tolerate multiple GLP-1 receptor agonist in the past.  She had a wide range of variation on using Humulin  U-500 but she feels comfortable this way she will adjust based on meal size and blood sugar.  - Medications: See below  Continue Farxiga  10 mg daily. Increase Humulin  U500 : 30 - 50 units with breakfast,0- 30 units with lunch and 40-60 units with supper and 20-40 units at bedtime.  She mostly skip insulin  at lunchtime.  Advised to take 5 units more than what she is taking currently to avoid hypoglycemia.  Increase glimepiride  4 mg daily, to 6 mg daily.  - Home glucose testing: Before meals and bedtime 4 times a day.   - Discussed/ Gave Hypoglycemia treatment plan.  # Consult : not required at this time.   # Annual urine for microalbuminuria/  creatinine ratio, + microalbuminuria currently, continue ACE/ARB , not on ACE/ARB at this time.  Not on SGLT2 inhibitor due to UTI. Last  Lab Results  Component Value Date   MICRALBCREAT 67 (H) 02/12/2024    # Foot check nightly.  # She has ? diabetic retinopathy.  Advised for regular follow-up with ophthalmology.  - Diet: Make healthy diabetic food choices - Life style / activity / exercise: Discussed about regular exercise.  2. Blood pressure  -  BP Readings from Last 1 Encounters:  05/20/24 132/70    - Control is in target.  - No change in current plans.  3. Lipid status / Hyperlipidemia - Last  Lab Results  Component Value Date   LDLCALC 85 07/11/2021   - Continue atorvastatin  40 mg daily.  Managed by primary care provider.   Diagnoses and all orders for this visit:  Uncontrolled type 2 diabetes mellitus with hyperglycemia, with long-term current use of insulin  (HCC) -     POCT glycosylated hemoglobin (Hb A1C) -     glimepiride  (AMARYL ) 4 MG tablet; Take 1.5 tablets (6 mg total) by mouth daily before breakfast. -     insulin  regular human CONCENTRATED (HUMULIN  R U-500 KWIKPEN) 500 UNIT/ML KwikPen; INJECT 40 UNITS UNDER THE SKIN BEFORE BREAKFAST, 20-25 UNITS BEFORE LUNCH AND 40 - 60 UNITS AT SUPPER. Maximum 125 units/day.    DISPOSITION Follow up in clinic in 3 months suggested.   All questions answered and patient verbalized understanding of the plan.  [place] Cydnie Deason, MD [facility] Endocrinology [facility] [address] [place], [address] [place], [place] [address] Phone # [phone]  At least part of this note was generated using voice recognition software. Inadvertent word errors may have occurred, which were not recognized during the proofreading process.

## 2024-05-22 ENCOUNTER — Other Ambulatory Visit (INDEPENDENT_AMBULATORY_CARE_PROVIDER_SITE_OTHER)

## 2024-05-22 DIAGNOSIS — E538 Deficiency of other specified B group vitamins: Secondary | ICD-10-CM | POA: Diagnosis not present

## 2024-05-22 DIAGNOSIS — R8290 Unspecified abnormal findings in urine: Secondary | ICD-10-CM

## 2024-05-22 LAB — VITAMIN B12: Vitamin B-12: 218 pg/mL (ref 211–911)

## 2024-05-23 LAB — URINE CULTURE
MICRO NUMBER:: 16601324
SPECIMEN QUALITY:: ADEQUATE

## 2024-05-26 ENCOUNTER — Other Ambulatory Visit: Payer: Self-pay | Admitting: Internal Medicine

## 2024-05-26 DIAGNOSIS — M545 Low back pain, unspecified: Secondary | ICD-10-CM

## 2024-05-26 NOTE — Telephone Encounter (Signed)
 Last Fill: 03/17/2024  Next Visit: 08/01/2024  Last Visit: 05/01/2024  Dx: Iliotibial band syndrome   Current Dose per office note on 05/01/2024: not discussed  Okay to refill Flexeril ?

## 2024-06-05 ENCOUNTER — Other Ambulatory Visit: Payer: Self-pay | Admitting: Urology

## 2024-06-11 NOTE — Progress Notes (Signed)
 Left voicemail to call [phone] up until 2:00 pm today

## 2024-06-11 NOTE — Progress Notes (Signed)
 Left voicemail to call [phone] for instructions for procedure Monday June 16, 2024.

## 2024-06-12 NOTE — Progress Notes (Signed)
 Left voicemail with instructions for nothing to eat or drink after midnight on Sunday. May take asthma medications, amlodipine  Monday morning with a sip of water. Arrival time to [facility] is 6:45 AM Monday 06/16/2024.  Wear comfortable clothes without metal,belt buckles, buttons, or zippers. Bring blue folder on Monday. Stop voltaren , vitamins and supplements today as well as other aspirin /aspirin  products and nonsteroidal anti inflammatory meds such as advil , motrin , aleve, and ibuprofen . Refer to list in blue folder for any questions concerning these medications. Left phone number to call back 06/12/2024 [phone] and 06/13/2024 call [phone].

## 2024-06-13 NOTE — Progress Notes (Signed)
 Phone went straight to voicemail. Left message for arrival time Monday, June 16, 2024 6:45 AM. Refer to information in blue folder for meds to take and avoid prior to procedure. Instructed to bring blue folder for the procedure.

## 2024-06-16 ENCOUNTER — Encounter (HOSPITAL_COMMUNITY): Payer: Self-pay | Admitting: Urology

## 2024-06-16 ENCOUNTER — Ambulatory Visit (HOSPITAL_COMMUNITY)
Admission: RE | Admit: 2024-06-16 | Discharge: 2024-06-16 | Disposition: A | Discharge status: Home / Self Care | Attending: Urology | Admitting: Urology

## 2024-06-16 ENCOUNTER — Encounter (HOSPITAL_COMMUNITY)
Admission: RE | Disposition: A | Discharge status: Home / Self Care | Payer: Self-pay | Source: Home / Self Care | Attending: Urology

## 2024-06-16 ENCOUNTER — Other Ambulatory Visit: Payer: Self-pay

## 2024-06-16 ENCOUNTER — Ambulatory Visit (HOSPITAL_COMMUNITY)

## 2024-06-16 DIAGNOSIS — Z7984 Long term (current) use of oral hypoglycemic drugs: Secondary | ICD-10-CM | POA: Diagnosis not present

## 2024-06-16 DIAGNOSIS — Z8673 Personal history of transient ischemic attack (TIA), and cerebral infarction without residual deficits: Secondary | ICD-10-CM | POA: Diagnosis not present

## 2024-06-16 DIAGNOSIS — I10 Essential (primary) hypertension: Secondary | ICD-10-CM | POA: Insufficient documentation

## 2024-06-16 DIAGNOSIS — E119 Type 2 diabetes mellitus without complications: Secondary | ICD-10-CM | POA: Diagnosis not present

## 2024-06-16 DIAGNOSIS — J45909 Unspecified asthma, uncomplicated: Secondary | ICD-10-CM | POA: Insufficient documentation

## 2024-06-16 DIAGNOSIS — N200 Calculus of kidney: Secondary | ICD-10-CM | POA: Diagnosis present

## 2024-06-16 HISTORY — PX: EXTRACORPOREAL SHOCK WAVE LITHOTRIPSY: SHX1557

## 2024-06-16 LAB — GLUCOSE, CAPILLARY: Glucose-Capillary: 128 mg/dL — ABNORMAL HIGH (ref 70–99)

## 2024-06-16 SURGERY — LITHOTRIPSY, ESWL
Anesthesia: LOCAL | Laterality: Left

## 2024-06-16 MED ORDER — DIAZEPAM 5 MG PO TABS
10.0000 mg | ORAL_TABLET | ORAL | Status: AC
  Administered 2024-06-16: 10 mg via ORAL

## 2024-06-16 MED ORDER — CIPROFLOXACIN HCL 500 MG PO TABS
ORAL_TABLET | ORAL | Status: AC
  Filled 2024-06-16: qty 1

## 2024-06-16 MED ORDER — SODIUM CHLORIDE 0.9 % IV SOLN
INTRAVENOUS | Status: DC
  Administered 2024-06-16: 1000 mL via INTRAVENOUS

## 2024-06-16 MED ORDER — DIAZEPAM 5 MG PO TABS
ORAL_TABLET | ORAL | Status: AC
  Filled 2024-06-16: qty 2

## 2024-06-16 MED ORDER — TAMSULOSIN HCL 0.4 MG PO CAPS: 0.4000 mg | ORAL_CAPSULE | Freq: Every day | ORAL | 0 refills | Status: DC

## 2024-06-16 MED ORDER — CIPROFLOXACIN HCL 500 MG PO TABS
500.0000 mg | ORAL_TABLET | ORAL | Status: AC
  Administered 2024-06-16: 500 mg via ORAL

## 2024-06-16 MED ORDER — DIPHENHYDRAMINE HCL 25 MG PO CAPS
25.0000 mg | ORAL_CAPSULE | ORAL | Status: AC
  Administered 2024-06-16: 25 mg via ORAL

## 2024-06-16 MED ORDER — TRAMADOL HCL 50 MG PO TABS: 50.0000 mg | ORAL_TABLET | Freq: Four times a day (QID) | ORAL | 0 refills | Status: AC | PRN

## 2024-06-16 MED ORDER — DIPHENHYDRAMINE HCL 25 MG PO CAPS
ORAL_CAPSULE | ORAL | Status: AC
  Filled 2024-06-16: qty 1

## 2024-06-16 NOTE — H&P (Signed)
 H&P  History of Present Illness: Holly Ortiz is a 58 y.o. year old F who presents today for treatment of a left renal stone  No acute complaints  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-I)    Anxiety    on meds   Asthma    childhood   Cataract 2015   bilateral   Constipation    takes Miralax  daily   Depression    on meds   Diabetes mellitus    type II-on meds   Hyperlipidemia    on meds   Hypertension    on meds   Kidney stone    Lupus 2022   dx 02/2021   Seasonal allergies    Urticaria, chronic    UTI (lower urinary tract infection)     Past Surgical History:  Procedure Laterality Date   ABDOMINAL HYSTERECTOMY  08/07/2019   BREAST ENHANCEMENT SURGERY  1993   breast lift/reduction   CATARACT EXTRACTION, BILATERAL Bilateral 2014   CATARACT EXTRACTION, BILATERAL Bilateral 04/2022   [place]ON[place]COPY  2014   in [place]   LAPAROSCOPIC VAGINAL HYSTERECTOMY WITH SALPINGO OOPHORECTOMY Bilateral 08/07/2019   Procedure: LAPAROSCOPIC ASSISTED VAGINAL HYSTERECTOMY WITH SALPINGO OOPHORECTOMY, possible abdominal hysterectomy;  Surgeon: Curlene Agent, MD;  Location: [facility] OR;  Service: Gynecology;  Laterality: Bilateral;  possible abdominal hysterectomy pt is diabetic and hypertensive   SOFT TISSUE MASS EXCISION Right 2016   [place]   TUBAL LIGATION  1995   tummy tuck  1993    Home Medications:  Current Facility-Administered Medications for the 06/16/24 encounter [facility] Encounter)  Medication   0.9 %  sodium chloride  infusion   Current Meds  Medication Sig   Accu-Chek Softclix Lancets lancets 1 each 3 (three) times daily.   albuterol  (VENTOLIN  HFA) 108 (90 Base) MCG/ACT inhaler Inhale 2 puffs into the lungs every 6 (six) hours as needed for wheezing or shortness of breath.   amLODipine  (NORVASC ) 10 MG tablet Take 1 tablet (10 mg total) by mouth daily.   atorvastatin  (LIPITOR) 40 MG tablet Take 1 tablet (40 mg  total) by mouth daily.   Blood Glucose Monitoring Suppl DEVI 1 each by Does not apply route in the morning, at noon, and at bedtime. May substitute to any manufacturer covered by patient's insurance.   cyclobenzaprine  (FLEXERIL ) 5 MG tablet TAKE 1 TABLET BY MOUTH AT BEDTIME AS NEEDED FOR MUSCLE SPASMS.   escitalopram  (LEXAPRO ) 20 MG tablet Take 1 tablet (20 mg total) by mouth daily.   fenofibrate  (TRICOR ) 145 MG tablet Take 1 tablet (145 mg total) by mouth daily.   glimepiride  (AMARYL ) 4 MG tablet Take 1.5 tablets (6 mg total) by mouth daily before breakfast.   Glucose Blood (BLOOD GLUCOSE TEST STRIPS) STRP 1 each by In Vitro route in the morning, at noon, and at bedtime. May substitute to any manufacturer covered by patient's insurance.   hydroxychloroquine  (PLAQUENIL ) 200 MG tablet Take 2 tablets (400 mg total) by mouth daily.   insulin  regular human CONCENTRATED (HUMULIN  R U-500 KWIKPEN) 500 UNIT/ML KwikPen INJECT 40 UNITS UNDER THE SKIN BEFORE BREAKFAST, 20-25 UNITS BEFORE LUNCH AND 40 - 60 UNITS AT SUPPER. Maximum 125 units/day.   montelukast  (SINGULAIR ) 10 MG tablet TAKE 1 TABLET BY MOUTH EVERYDAY AT BEDTIME    Allergies:  Allergies  Allergen Reactions   Ozempic  (0.25 Or 0.5 Mg-Dose) [Semaglutide (0.25 Or 0.5mg -Dos)] Nausea And Vomiting   Lisinopril  Swelling and Other (See Comments)  Angioedema, including likely GI involvement   Metformin  And Related Diarrhea and Nausea And Vomiting   Pioglitazone  Hydrochloride Other (See Comments)    Reaction not stated    Family History  Problem Relation Age of Onset   Colon polyps Mother 70   Colon cancer Mother 51   Hypertension Mother    Diabetes Father    Hypertension Father    COPD Father        Cause of death   Hypertension Sister    Diabetes Sister    Cancer Sister        Brain glioblastoma   Diabetes Sister    Hypertension Brother    Hyperlipidemia Other    Heart disease Other    Esophageal cancer Neg Hx    Stomach cancer Neg  Hx    Rectal cancer Neg Hx     Social History:  reports that she has never smoked. She has never been exposed to tobacco smoke. She has never used smokeless tobacco. She reports that she does not drink alcohol and does not use drugs.  ROS: A complete review of systems was performed.  All systems are negative except for pertinent findings as noted.  Physical Exam:  Vital signs in last 24 hours: Temp:  [99 F (37.2 C)] 99 F (37.2 C) (07/14 0723) Pulse Rate:  [93] 93 (07/14 0723) Resp:  [16] 16 (07/14 0723) BP: (134)/(61) 134/61 (07/14 0723) SpO2:  [98 %] 98 % (07/14 0723) Weight:  [99.8 kg] 99.8 kg (07/14 0723) Constitutional:  Alert and oriented, No acute distress Cardiovascular: Regular rate and rhythm Respiratory: Normal respiratory effort, Lungs clear bilaterally GI: Abdomen is soft, nontender, nondistended, no abdominal masses Lymphatic: No lymphadenopathy Neurologic: Grossly intact, no focal deficits Psychiatric: Normal mood and affect   Laboratory Data:  No results for input(s): WBC, HGB, HCT, PLT in the last 72 hours.  No results for input(s): NA, K, CL, GLUCOSE, BUN, CALCIUM , CREATININE in the last 72 hours.  Invalid input(s): CO3   No results found for this or any previous visit (from the past 24 hours). No results found for this or any previous visit (from the past 240 hours).  Renal Function: No results for input(s): CREATININE in the last 168 hours. CrCl cannot be calculated (Patient's most recent lab result is older than the maximum 21 days allowed.).  Radiologic Imaging: No results found.  Assessment:  Holly Ortiz is a 58 y.o. year old F with left reanl stone   Plan:  --to OR as planned for L ESWL. Procedure and risks reviewed, including but not limited to hematuria, infection, sepsis, damage to GU tract, failure to complete procedure, retained stone fragments, need for future procedures, steinstrasse, pain, failure to  pass fragments  Herlene Foot, MD 06/16/2024, 7:37 AM  [facility] Pager: [phone]

## 2024-06-16 NOTE — Op Note (Signed)
 See [employer] operative note scanned into chart. Also because of the size, density, location and other factors that cannot be anticipated I feel this will likely be a staged procedure. This fact supersedes any indication in the scanned [place] stone operative note to the contrary.  Herlene Foot MD 06/16/2024, 9:19 AM  [facility]  Pager: [phone]

## 2024-06-16 NOTE — Discharge Instructions (Addendum)
1. You should strain your urine and collect all fragments and bring them to your follow up appointment.  °2. You should take your pain medication as needed.  Please call if your pain is severe to the point that it is not controlled with your pain medication. °3. You should call if you develop fever > 101 or persistent nausea or vomiting. °4. Your doctor may prescribe tamsulosin to take to help facilitate stone passage. °

## 2024-06-17 ENCOUNTER — Encounter (HOSPITAL_COMMUNITY): Payer: Self-pay | Admitting: Urology

## 2024-07-12 ENCOUNTER — Other Ambulatory Visit: Payer: Self-pay | Admitting: Family Medicine

## 2024-07-12 DIAGNOSIS — F419 Anxiety disorder, unspecified: Secondary | ICD-10-CM

## 2024-07-12 DIAGNOSIS — I10 Essential (primary) hypertension: Secondary | ICD-10-CM

## 2024-07-15 ENCOUNTER — Telehealth: Payer: Self-pay | Admitting: Internal Medicine

## 2024-07-15 NOTE — Telephone Encounter (Signed)
 Pt called to ask if she could drop off FMLA paperwork for Dr. Jeannetta to sign. Pt call back number is [phone]

## 2024-07-15 NOTE — Telephone Encounter (Signed)
 FMLA paperwork placed on Dr. Joyce desk.

## 2024-07-15 NOTE — Telephone Encounter (Signed)
 Advised the patient it should be fine to drop off the paperwork for Dr. Jeannetta to look at. Advised the patient if Dr. Jeannetta agrees to complete the FMLA, we usually allow up to two weeks to get it done. Patient verbalized understanding. Patient states she would like intermittent leave.

## 2024-07-18 NOTE — Progress Notes (Signed)
 Office Visit Note  Patient: Holly Ortiz             Date of Birth: 05-12-1966           MRN: [id number]             PCP: Berneta Elsie Sayre, MD Referring: Berneta Elsie Sayre,* Visit Date: 08/01/2024   Subjective:  Follow-up   Discussed the use of AI scribe software for clinical note transcription with the patient, who gave verbal consent to proceed.  History of Present Illness   Holly Ortiz is a 58 y.o. 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     She experiences significant fatigue and weakness, describing it as feeling 'like a truck hit me' upon waking. Her energy suffices only for seven and a half to eight hours of work, after which she eats, showers, and goes to bed. She is scheduled for iron  infusions next week, having never had them before except during a recent hospital stay. Her iron  levels have decreased again after previously stabilizing post-hysterectomy. Despite a diet rich in spinach and other iron -containing foods, her body is not absorbing iron  efficiently. Her last hemoglobin is 8.7, and her iron  saturation is 7%.  She experiences cold symptoms, which exacerbate her weakness. Despite taking all her medications and vitamins, she feels very weak. She has a history of skin changes, which have improved significantly after receiving shots, and she no longer experiences pain or dry patches. However, she recently experienced a 'butterfly' rash and dryness in her mouth for the first time, along with persistent tiredness.  Her blood sugar levels have improved since hospitalization, with her A1c decreasing from 14 to 10. She experienced a spike to 520 during a period of illness and stress, which she attributes to not taking her medication due to vomiting and lack of energy. Her blood sugar is now more stable, with recent readings around 109, though occasionally reaching 199-200.  She reports high stress  levels, particularly related to home renovations and work conditions, which she feels negatively impact her health. She experiences anxiety and occasional crying spells, feeling overwhelmed by her health issues at 70. She describes anhedonia and fatigue, but does not feel typically 'blue' or 'down'.  She has not been taking diclofenac  for hip pain, which has improved greatly after injections in May though she still experiences tightness and stiffness.       Previous HPI 05/01/2024 Holly Ortiz is a 58 y.o. 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     She has experienced episodes of hematuria, initially reported to her endocrinologist. A subsequent urinalysis revealed a urinary tract infection, for which she was prescribed antibiotics. She has a history of a large kidney stone in her left kidney, discovered during a hospital visit due to severe pain. The stone was not treated at the time, and she was discharged. During her hospital stay, she contracted COVID-19, leading to a week-long absence from work. She continues to experience pain, which was particularly severe during a recent vacation.   She describes persistent and worsening pain in her hips, radiating downwards and associated with significant weakness. This pain has severely impacted her mobility, necessitating the use of a wheelchair during a recent vacation. She experiences frequent falls due to lack of strength, with a notable incident occurring at work two weeks prior to the visit. The difficulty in rising from seated positions has  been ongoing for six to seven years but has worsened recently.   She notes swelling in her feet, describing them as resembling 'elephant feet' at times, although they are less swollen today. She also reports a sensation of movement within her feet when they are swollen.   Her current medications include hydroxychloroquine  and Claritin  for hives.  She denies any issues with hydroxychloroquine  and takes Claritin  as needed for hives, which she experienced upon waking today. She has not been taking any over-the-counter medications for inflammation.     Previous HPI 01/28/2024 Holly Ortiz is a 58 y.o. 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     She has been experiencing chronic hives with intermittent episodes and a brief period of improvement lasting about a week. Benadryl  provides some relief but causes significant sedation. The hives are less intense today, likely due to taking Benadryl  over the weekend.   She describes severe fatigue, with energy only sufficient for work and returning home. Pain starts behind her neck and radiates to her toes, described as the worst pain she has ever experienced. Swelling is noted, particularly in her hands, affecting how her clothes fit. She also feels her abdomen is hard.   She has experienced several falls, often losing balance, particularly when getting out of her car or doing housework. Recent falls include one while mopping the floor and another significant fall down the stairs. No neuropathy is reported, and her endocrinologist does not suspect it either.   Urinary incontinence has been present for over a year, with increased frequency in the past two to three years. She has not been evaluated by a urologist. Occasional bowel incontinence is also reported.   She reports skin changes, describing 'fish skin' that becomes hard and flaky, and notes her hair has become very dry and thin, with significant hair loss. She takes Advil  for pain, typically three tablets before work and two after dinner, but sometimes takes four tablets at a time.   Anxiety is managed with Lexapro , but she notes it has been 'crazy' at times recently.      Previous HPI 07/26/2023 Holly Ortiz is a 58 y.o. female here for follow up for differential  connective tissue disease on hydroxychloroquine  400 mg daily.  After the last visit she tried resuming the duloxetine  30 mg daily but felt some intermittent increased anxiety or agitation with the medicine so stopped taking it and the symptoms improved.  At least 1 episode of increased joint swelling most prominent at her right hand and the third finger this was about 3 weeks ago and resolved over the course of about 1 week.  Some itching and rashes in a few areas most persistently on the right elbow with a patch of red itchy skin this is partially improved with topical emollients. Currently she feels very poorly was started on Mounjaro  by her endocrinologist took the first dose 2 weeks ago.  Since she started this experience severe nausea loss of appetite and loose watery diarrhea with multiple small bowel movements after any solid oral intake.  Took the second dose 1 week ago and symptoms have been even worse often not even tolerating broth or other bland liquids.  Feels very drained and fatigued now.  Broke out in hives throughout her torso and worsening of skin rash on the arms for the past few days.  She says this is pretty typical reaction for her whenever she gets sick.   Previous  HPI 05/03/2023 Holly Ortiz is a 58 y.o. 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  Her previously worsened widespread symptoms of fatigue did get somewhat better after our visit last month feels the duloxetine  was beneficial for joint pains also with her brain fog.  Her prescription ran out before this visit and so she came off of it and noticed some worsening in pain again.  But she suffered a fall 3 weeks ago down the stairs and hit the back of her head.  Initially did not think much of it subsequently has been feeling worsening headaches in the back of the head and pain in her neck area.  Also is noticing numbness radiating down to the fourth and fifth  fingers in both hands.  She was seen at the ED and had a CT scan with no evidence of cervical spine injury and no hemorrhage.   Previous HPI 03/12/23 Holly Ortiz is a 58 y.o. female here for follow up for UCTD on hydroxychloroquine  400 mg daily symptoms currently in exacerbation for the past 3 weeks.  She is having increased pain in multiple areas neck shoulders arms as well as the low back and tailbone region.  She is feeling persistently fatigued every day also increased headaches.  She notices some more swelling in both hands then has been present for at least the past year.  She does describe very poor sleep during these few weeks and a very high level of work stress contributing.   Previous HPI 01/25/23 Holly Ortiz is a 58 y.o. female here for follow up for UCTD with inflammatory arthritis and chronic urticaria on hydroxychloroquine  400 mg daily.  Past few months she reports worsening fatigue and concentration and short-term memory problems.  Also increased pain especially in the low back bilaterally.  Does not have pain or numbness radiation into the legs.  She is experienced some weakness and difficulty standing back up from the floor or crouched position.  Frequently wakes up with low back pain that improves after taking 2 regular Advil  and moving around for a while.  Has not had severe outbreak of hives but keeps getting a small number of these around the neck and back of the scalp.  Follow-up with primary care office in December still had poorly controlled diabetes with hemoglobin A1c 11.8.  Skin rash behind the right knee is unchanged has a new erythematous rash in the right elbow. She has been under increased stress with work changes after her [employer] branch angry and was close now working in [place] still getting used to the change in location and coworkers.   Previous HPI 07/31/22 Holly Ortiz is a 58 y.o. female here for follow up for UCTD with symptoms of  inflammatory arthritis and chronic urticaria on HCQ 400 mg daily. She has generally been doing well without severe flare ups. She has followed up with her ophthalmologist but not remembered to specifically address her HCQ screening exam. Since last week she is having new left flank pain radiating around the side somewhat along bottom of ribs. She does not recall any preceding illness or injury. She does notice increased urinary frequency during this time. No dysuria or gross hematuria. She has a history of a kidney stone once about 30 years ago during pregnancy and last known UTI about 10 years ago. She has a new dog and has a lot of new scratches from this.   Previous HPI 01/31/2022 Holly Ortiz is a 58 y.o. female here for follow up for inflammatory arthritis and chronic urticaria on HCQ 400 mg daily. She had a significant flare up of symptoms around Thanksgiving that resolved after a couple weeks without specific intervention. She takes advil  as needed when joint pain and swelling are worse and when really bad sometimes flexeril . She had another shorter episode of symptoms for about 4 days earlier this year. Most common joint swelling lately is at the 3rd knuckle of each hand, knee swelling has been less common. She did not have many episodes of hives outside of these flares. She is starting to have nasal congestion and hoarseness of voice she thinks is seasonal allergies returning.   Previous HPI 08/02/21 Holly Ortiz is a 58 y.o. female here for follow up for inflammatory arthritis and chronic urticarial rashes on hydroxychloroquine  400 mg PO daily. Due to low back and knee pain that seemed noninflammatory also recommended trial of oral muscle relaxant as needed at night.  So far she feels a very good improvement in her overall symptoms.  She has not had any recurrence of skin rash since at least 2 months ago.  Joint pains are doing well.  She still has some of the fatigue and concentration  difficulty though this is also partially better.   02/22/21 Holly Ortiz is a 58 y.o. female here for evaluation of positive ANA with chronic joint pains, fatigue, and intermittent hives for years. She describes symptoms starting since around 2007. She has had pain in joints and muscles at various areas to some extent most of the time. She also developed a problem with recurrent urticaria rashes at multiple areas, most often after increased stress or other provocative events such as infection and antibiotic use. Since that time she has had workup with allergy and immunology clinics and dermatology clinic with no specific cause able to be found. A more recent symptom is change in sensation in her legs and feet. She notices swelling and discomfort after prolonged standing or prolonged sitting. She had a few episodes of becoming dizzy and unstable after standing and a few falls.   Labs reviewed 01/2021 ANA 1:80 homogenous, speckled CBC wnl TSH wnl CK wnl CRP wnl   Review of Systems  Constitutional:  Positive for fatigue.  HENT:  Positive for mouth dryness. Negative for mouth sores.   Eyes:  Positive for dryness.  Respiratory:  Positive for shortness of breath.   Cardiovascular:  Positive for palpitations. Negative for chest pain.  Gastrointestinal:  Positive for constipation and diarrhea. Negative for blood in stool.  Endocrine: Positive for increased urination.  Genitourinary:  Positive for involuntary urination.  Musculoskeletal:  Positive for joint pain, gait problem, joint pain, joint swelling, myalgias, muscle weakness, morning stiffness, muscle tenderness and myalgias.  Skin:  Positive for hair loss. Negative for color change, rash and sensitivity to sunlight.  Allergic/Immunologic: Positive for susceptible to infections.  Neurological:  Positive for dizziness and headaches.  Hematological:  Positive for swollen glands.  Psychiatric/Behavioral:  Positive for depressed mood. Negative  for sleep disturbance. The patient is nervous/anxious.     PMFS History:  Patient Active Problem List   Diagnosis Date Noted   Iron  deficiency anemia, unspecified 07/24/2024   UTI (urinary tract infection) 07/23/2024   Nausea and vomiting 07/23/2024   IT band syndrome 05/01/2024   Acute respiratory failure with hypoxia (HCC) 02/24/2024   Headache 05/03/2023   Other fatigue 01/25/2023   Rash and other nonspecific skin eruption 01/25/2023  Urinary frequency 08/02/2022   Left flank pain 07/31/2022   Pain in left knee 05/03/2021   Arthralgia 02/24/2021   Undifferentiated connective tissue disease (HCC) 02/22/2021   Iron  deficiency 07/07/2020   Left low back pain 01/14/2020   Anemia 12/02/2019   Lightheadedness 12/02/2019   Menorrhagia 08/07/2019   Abnormal uterine bleeding 08/07/2019   Anxiety and depression 07/03/2018   DKA (diabetic ketoacidosis) (HCC) 03/28/2018   Tachycardia 03/28/2018   Urticaria 03/28/2018   Hyperosmolar hyperglycemia 01/04/2018   AKI (acute kidney injury) (HCC) 01/04/2018   Hyperglycemia 01/03/2018   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-I)    Asthma    Type 2 diabetes mellitus with complication, with long-term current use of insulin  (HCC)    Elevated cholesterol    Essential hypertension     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-I)    Anxiety    on meds   Asthma    childhood   Cataract 2015   bilateral   Constipation    takes Miralax  daily   Depression    on meds   Diabetes mellitus    type II-on meds   Hyperlipidemia    on meds   Hypertension    on meds   Ketoacidosis 07/23/2024   Kidney stone    Lupus 2022   dx 02/2021   Seasonal allergies    Urticaria, chronic    UTI (lower urinary tract infection)     Family History  Problem Relation Age of Onset   Colon polyps Mother 42   Colon cancer Mother 62   Hypertension Mother    Diabetes  Father    Hypertension Father    COPD Father        Cause of death   Hypertension Sister    Diabetes Sister    Cancer Sister        Brain glioblastoma   Diabetes Sister    Hypertension Brother    Hyperlipidemia Other    Heart disease Other    Esophageal cancer Neg Hx    Stomach cancer Neg Hx    Rectal cancer Neg Hx    Past Surgical History:  Procedure Laterality Date   ABDOMINAL HYSTERECTOMY  08/07/2019   BREAST ENHANCEMENT SURGERY  1993   breast lift/reduction   CATARACT EXTRACTION, BILATERAL Bilateral 2014   CATARACT EXTRACTION, BILATERAL Bilateral 04/2022   COLONOSCOPY  2014   in [place]   EXTRACORPOREAL SHOCK WAVE LITHOTRIPSY Left 06/16/2024   Procedure: LITHOTRIPSY, ESWL;  Surgeon: Lovie Arlyss CROME, MD;  Location: [facility];  Service: Urology;  Laterality: Left;   LAPAROSCOPIC VAGINAL HYSTERECTOMY WITH SALPINGO OOPHORECTOMY Bilateral 08/07/2019   Procedure: LAPAROSCOPIC ASSISTED VAGINAL HYSTERECTOMY WITH SALPINGO OOPHORECTOMY, possible abdominal hysterectomy;  Surgeon: Curlene Agent, MD;  Location: [facility];  Service: Gynecology;  Laterality: Bilateral;  possible abdominal hysterectomy pt is diabetic and hypertensive   SOFT TISSUE MASS EXCISION Right 2016   [place]   TUBAL LIGATION  1995   tummy tuck  1993   Social History   Social History Narrative   Not on file   Immunization History  Administered Date(s) Administered   DTaP 07/04/2009   Influenza,inj,Quad PF,6+ Mos 01/05/2018, 08/28/2018, 09/05/2019, 10/05/2020   PFIZER(Purple Top)SARS-COV-2 Vaccination 02/03/2020, 03/02/2020   PNEUMOCOCCAL CONJUGATE-20 07/24/2024   Pneumococcal Polysaccharide-23 01/05/2018   Tdap 12/04/2012   Zoster Recombinant(Shingrix) 06/29/2021     Objective: Vital Signs: BP 118/65 (BP Location: Left Arm, Patient Position:  Sitting, Cuff Size: Normal)   Pulse 93   Resp 16   Ht 5' 3 (1.6 m)   Wt 223 lb (101.2 kg)   LMP 05/13/2019 Comment: patient had not had her period in a year and it  came back in May and June 2020  BMI 39.50 kg/m    Physical Exam Constitutional:      Appearance: She is obese.  Eyes:     Conjunctiva/sclera: Conjunctivae normal.  Cardiovascular:     Rate and Rhythm: Normal rate and regular rhythm.  Pulmonary:     Effort: Pulmonary effort is normal.     Breath sounds: Normal breath sounds.  Musculoskeletal:     Right lower leg: No edema.     Left lower leg: No edema.  Lymphadenopathy:     Cervical: No cervical adenopathy.  Skin:    General: Skin is warm and dry.     Findings: No rash.  Neurological:     Mental Status: She is alert.  Psychiatric:        Mood and Affect: Mood normal.      Musculoskeletal Exam:  Shoulders full ROM no tenderness or swelling Elbows full ROM no tenderness or swelling Wrists full ROM no tenderness or swelling Fingers full ROM no tenderness or swelling No hip tenderness to pressure or movement, stiff muscles limiting rotation ROM Knees full ROM no tenderness or swelling Ankles full ROM no tenderness or swelling  Investigation: No additional findings.  Imaging: CT ABDOMEN PELVIS W CONTRAST Result Date: 07/23/2024 CLINICAL DATA:  Abdominal pain, vomiting EXAM: CT ABDOMEN AND PELVIS WITH CONTRAST TECHNIQUE: Multidetector CT imaging of the abdomen and pelvis was performed using the standard protocol following bolus administration of intravenous contrast. RADIATION DOSE REDUCTION: This exam was performed according to the departmental dose-optimization program which includes automated exposure control, adjustment of the mA and/or kV according to patient size and/or use of iterative reconstruction technique. CONTRAST:  OMNIPAQUE  IOHEXOL  300 MG/ML  SOLN COMPARISON:  02/24/2024 FINDINGS: Lower chest: No acute findings Hepatobiliary: Diffuse low-density throughout the liver compatible with fatty infiltration. No focal abnormality. Gallbladder unremarkable. Pancreas: No focal abnormality or ductal dilatation. Spleen:  No focal abnormality.  Normal size. Adrenals/Urinary Tract: Adrenal glands normal. Left lower pole stone again seen, unchanged since prior study. No ureteral stones or hydronephrosis. No suspicious renal abnormality. Urinary bladder decompressed, unremarkable. Stomach/Bowel: Normal appendix. Stomach, large and small bowel grossly unremarkable. Vascular/Lymphatic: Of aortoiliac atherosclerosis. No evidence of aneurysm or adenopathy. Reproductive: Prior hysterectomy.  No adnexal masses. Other: No free fluid or free air. Musculoskeletal: No acute bony abnormality. IMPRESSION: No acute findings in the abdomen or pelvis. Hepatic steatosis. Aortoiliac atherosclerosis. Electronically Signed   By: Franky Crease M.D.   On: 07/23/2024 01:06    Recent Labs: Lab Results  Component Value Date   WBC 6.8 07/25/2024   HGB 8.7 (L) 07/25/2024   PLT 232 07/25/2024   NA 141 07/25/2024   K 3.5 07/25/2024   CL 103 07/25/2024   CO2 29 07/25/2024   GLUCOSE 191 (H) 07/25/2024   BUN 11 07/25/2024   CREATININE 0.59 07/25/2024   BILITOT 1.1 07/22/2024   ALKPHOS 67 07/22/2024   AST 22 07/22/2024   ALT 24 07/22/2024   PROT 6.4 (L) 07/22/2024   ALBUMIN  2.8 (L) 07/22/2024   CALCIUM  8.4 (L) 07/25/2024   GFRAA >60 07/30/2019    Speciality Comments: PLQ Eye Exam: 10/12/2022 WNL @ [facility] Patient will schedule Eye Exam-patient to call  and schedule  Procedures:  No procedures performed Allergies: Ozempic  (0.25 or 0.5 mg-dose) [semaglutide (0.25 or 0.5mg -dos)], Lisinopril , Metformin  and related, and Pioglitazone  hydrochloride   Assessment / Plan:     Visit Diagnoses: Undifferentiated connective tissue disease (HCC) - Plan: hydroxychloroquine  (PLAQUENIL ) 200 MG tablet Ongoing fatigue is really the worst problem. This might also be related to her hyperglycemia and anemia quite a lot. The previous urticaria and eczema type rashes a much better. Hip pain also remains improved since May. - Continue  hydroxychloroquine  400 mg daily - Completed FMLA paperwork returned to her  High risk medication use - hydroxychloroquine  400 mg daily, PLQ Eye Exam: 10/12/2022 WNL. Needs updated PLQ eye exam. Recent hospital labs reviewed had DKA related AKI and IDA both being addressed.  Iliotibial band syndrome, unspecified laterality - diclofenac  75 mg BID PRN for short-term anti-inflammatory treatment. Doing much better, not requiring any regular NSAID treatment.  Anemia of chronic disease with iron  deficiency Anemia with low hemoglobin and iron  saturation, consistent with chronic disease. Iron  binding capacity not elevated. Going for iron  infusions at outpatient infusion center on [address], starting Wednesday and continuing the following Thursday.  Type 2 diabetes mellitus, uncontrolled Uncontrolled diabetes with recent A1c reduction. Blood glucose levels improved but not optimal. Stress and dehydration impair glucose control.  Depression and anxiety symptoms Symptoms include fatigue, anhedonia, and psychomotor delay. Emotional stress worsens health issues. Discussed chemical similarities and treatment overlap. Is on lexapro  20 mg daily.      Orders: No orders of the defined types were placed in this encounter.  Meds ordered this encounter  Medications   hydroxychloroquine  (PLAQUENIL ) 200 MG tablet    Sig: Take 2 tablets (400 mg total) by mouth daily.    Dispense:  180 tablet    Refill:  0   35 minutes spent on encounter today including review of numerous results and notes with ercent hospitalization, patient counseling, completion of FMLA work documentation.  Follow-Up Instructions: Return in about 3 months (around 11/01/2024) for UCTD/SLE on HCQ f/u 3mos.   Lonni LELON Ester, MD  Note - This record has been created using [employer].  Chart creation errors have been sought, but may not always  have been located. Such creation errors do not reflect on  the standard of medical  care.

## 2024-07-22 ENCOUNTER — Encounter (HOSPITAL_COMMUNITY): Payer: Self-pay | Admitting: Emergency Medicine

## 2024-07-22 ENCOUNTER — Other Ambulatory Visit: Payer: Self-pay

## 2024-07-22 ENCOUNTER — Observation Stay (HOSPITAL_COMMUNITY)
Admission: EM | Admit: 2024-07-22 | Discharge: 2024-07-25 | Disposition: A | Discharge status: Home / Self Care | Attending: Student | Admitting: Student

## 2024-07-22 DIAGNOSIS — M359 Systemic involvement of connective tissue, unspecified: Secondary | ICD-10-CM | POA: Insufficient documentation

## 2024-07-22 DIAGNOSIS — N179 Acute kidney failure, unspecified: Secondary | ICD-10-CM | POA: Diagnosis not present

## 2024-07-22 DIAGNOSIS — E611 Iron deficiency: Secondary | ICD-10-CM

## 2024-07-22 DIAGNOSIS — D649 Anemia, unspecified: Secondary | ICD-10-CM | POA: Diagnosis not present

## 2024-07-22 DIAGNOSIS — I10 Essential (primary) hypertension: Secondary | ICD-10-CM | POA: Insufficient documentation

## 2024-07-22 DIAGNOSIS — J45909 Unspecified asthma, uncomplicated: Secondary | ICD-10-CM | POA: Diagnosis not present

## 2024-07-22 DIAGNOSIS — N390 Urinary tract infection, site not specified: Secondary | ICD-10-CM | POA: Insufficient documentation

## 2024-07-22 DIAGNOSIS — R112 Nausea with vomiting, unspecified: Secondary | ICD-10-CM

## 2024-07-22 DIAGNOSIS — F32A Depression, unspecified: Secondary | ICD-10-CM | POA: Insufficient documentation

## 2024-07-22 DIAGNOSIS — E1110 Type 2 diabetes mellitus with ketoacidosis without coma: Secondary | ICD-10-CM | POA: Diagnosis not present

## 2024-07-22 DIAGNOSIS — Z794 Long term (current) use of insulin: Secondary | ICD-10-CM | POA: Diagnosis not present

## 2024-07-22 DIAGNOSIS — E785 Hyperlipidemia, unspecified: Secondary | ICD-10-CM | POA: Insufficient documentation

## 2024-07-22 DIAGNOSIS — F419 Anxiety disorder, unspecified: Secondary | ICD-10-CM | POA: Diagnosis not present

## 2024-07-22 LAB — CBC WITH DIFFERENTIAL/PLATELET
Abs Immature Granulocytes: 0.07 K/uL (ref 0.00–0.07)
Basophils Absolute: 0 K/uL (ref 0.0–0.1)
Basophils Relative: 0 %
Eosinophils Absolute: 0 K/uL (ref 0.0–0.5)
Eosinophils Relative: 0 %
HCT: 31.9 % — ABNORMAL LOW (ref 36.0–46.0)
Hemoglobin: 10.2 g/dL — ABNORMAL LOW (ref 12.0–15.0)
Immature Granulocytes: 1 %
Lymphocytes Relative: 8 %
Lymphs Abs: 0.6 K/uL — ABNORMAL LOW (ref 0.7–4.0)
MCH: 26 pg (ref 26.0–34.0)
MCHC: 32 g/dL (ref 30.0–36.0)
MCV: 81.4 fL (ref 80.0–100.0)
Monocytes Absolute: 0.4 K/uL (ref 0.1–1.0)
Monocytes Relative: 5 %
Neutro Abs: 7.3 K/uL (ref 1.7–7.7)
Neutrophils Relative %: 86 %
Platelets: 236 K/uL (ref 150–400)
RBC: 3.92 MIL/uL (ref 3.87–5.11)
RDW: 13.3 % (ref 11.5–15.5)
WBC: 8.4 K/uL (ref 4.0–10.5)
nRBC: 0 % (ref 0.0–0.2)

## 2024-07-22 MED ORDER — ONDANSETRON HCL 4 MG/2ML IJ SOLN
4.0000 mg | Freq: Once | INTRAMUSCULAR | Status: AC
  Administered 2024-07-22: 4 mg via INTRAVENOUS
  Filled 2024-07-22: qty 2

## 2024-07-22 MED ORDER — SODIUM CHLORIDE 0.9 % IV BOLUS
1000.0000 mL | Freq: Once | INTRAVENOUS | Status: AC
  Administered 2024-07-22: 1000 mL via INTRAVENOUS

## 2024-07-22 NOTE — ED Triage Notes (Signed)
 Pt arrives w/ c/o Lupus flare up. Pain & vomiting x 2 days. Pt reports pain in her back and lower abdomen. Reports she usually takes benadryl  and tylenol  for flare up which helps but states pain is worse. Was nauseous w/ EMS- given 4mg  zofran . 350mL NS. 20G RAC.  VSS. 520 CBG

## 2024-07-22 NOTE — ED Provider Notes (Signed)
 [place] EMERGENCY DEPARTMENT AT [facility] Provider Note   CSN: [id number] Arrival date & time: 07/22/24  2109     Patient presents with: Lupus Flare Up    Holly Ortiz is a 58 y.o. female.  Past medical history significant for diabetes, lupus, hypertension, and asthma presents today for what she feels is a lupus flareup.  Patient reports abdominal pain, back pain, chills, bodyaches, nausea, and vomiting x 2 days.  Patient states her pain is in her back and lower abdomen.  Patient has been taking Benadryl  and Tylenol  which helps some but is still having bad pain.  Patient denies fever, chest pain, shortness of breath, fall, injury, headache, vision changes, numbness, or weakness.  {Add pertinent medical, surgical, social history, OB history to HPI:32947} HPI     Prior to Admission medications   Medication Sig Start Date End Date Taking? Authorizing Provider  Accu-Chek Softclix Lancets lancets 1 each 3 (three) times daily. 03/08/24   [provider]  acetaminophen  (TYLENOL ) 500 MG tablet Take 2 tablets (1,000 mg total) by mouth every 6 (six) hours as needed. 08/08/19   Tomblin, James, MD  albuterol  (VENTOLIN  HFA) 108 (90 Base) MCG/ACT inhaler Inhale 2 puffs into the lungs every 6 (six) hours as needed for wheezing or shortness of breath. 02/25/24   Caleen Sora C, MD  amLODipine  (NORVASC ) 10 MG tablet TAKE 1 TABLET BY MOUTH EVERY DAY 07/14/24   Berneta Elsie Sayre, MD  atorvastatin  (LIPITOR) 40 MG tablet Take 1 tablet (40 mg total) by mouth daily. 04/24/23   Berneta Elsie Sayre, MD  Blood Glucose Monitoring Suppl DEVI 1 each by Does not apply route in the morning, at noon, and at bedtime. May substitute to any manufacturer covered by patient's insurance. 02/12/24   Thapa, [place], MD  Cholecalciferol (VITAMIN D3) 50 MCG (2000 UT) TABS Take 2,000 Units by mouth daily.    [provider]  cyclobenzaprine  (FLEXERIL ) 5 MG tablet TAKE 1 TABLET BY MOUTH AT BEDTIME  AS NEEDED FOR MUSCLE SPASMS. 05/26/24   Rice, Lonni ORN, MD  diclofenac  (VOLTAREN ) 75 MG EC tablet Take 1 tablet (75 mg total) by mouth 2 (two) times daily as needed. 05/01/24   Jeannetta Lonni ORN, MD  escitalopram  (LEXAPRO ) 20 MG tablet TAKE 1 TABLET BY MOUTH EVERY DAY 07/14/24   Berneta Elsie Sayre, MD  fenofibrate  (TRICOR ) 145 MG tablet Take 1 tablet (145 mg total) by mouth daily. 02/08/21   Von [place], MD  glimepiride  (AMARYL ) 4 MG tablet Take 1.5 tablets (6 mg total) by mouth daily before breakfast. 05/20/24   Thapa, [place], MD  hydroxychloroquine  (PLAQUENIL ) 200 MG tablet Take 2 tablets (400 mg total) by mouth daily. 05/01/24   Rice, Lonni ORN, MD  Insulin  Pen Needle (BD PEN NEEDLE NANO 2ND GEN) 32G X 4 MM MISC Use four times daily to inject insulin . 07/30/19   [place]on [place], MD  insulin  regular human CONCENTRATED (HUMULIN  R U-500 KWIKPEN) 500 UNIT/ML KwikPen INJECT 40 UNITS UNDER THE SKIN BEFORE BREAKFAST, 20-25 UNITS BEFORE LUNCH AND 40 - 60 UNITS AT SUPPER. Maximum 125 units/day. 05/20/24   Thapa, [place], MD  Melatonin 5 MG TABS Take 5 mg by mouth at bedtime as needed (FOR SLEEP).    [provider]  MIEBO 1.338 GM/ML SOLN Place 1 drop into both eyes 2 (two) times daily. 11/10/22   [provider]  montelukast  (SINGULAIR ) 10 MG tablet TAKE 1 TABLET BY MOUTH EVERYDAY AT BEDTIME 10/17/23   Berneta Elsie  Sim, MD  phenazopyridine  (PYRIDIUM ) 200 MG tablet Take 1 tablet (200 mg total) by mouth 3 (three) times daily. 02/13/24   Reddick, Johnathan B, NP  polyethylene glycol (MIRALAX  / GLYCOLAX ) 17 g packet Take 17 g by mouth daily as needed for mild constipation.    [provider]  tamsulosin  (FLOMAX ) 0.4 MG CAPS capsule Take 1 capsule (0.4 mg total) by mouth daily after supper. 06/16/24   Lovie Arlyss CROME, MD  triamcinolone  ointment (KENALOG ) 0.5 % Apply 1 Application topically 2 (two) times daily as needed. 05/01/24   Jeannetta Lonni ORN, MD   triamterene -hydrochlorothiazide  (MAXZIDE -25) 37.5-25 MG tablet TAKE 1 TABLET BY MOUTH EVERY DAY 10/17/23   Berneta Elsie Sim, MD  vitamin B-12 (CYANOCOBALAMIN ) 1000 MCG tablet Take 1,000 mcg by mouth daily.    [provider]  zinc gluconate 50 MG tablet Take 50 mg by mouth daily.     [provider]    Allergies: Ozempic  (0.25 or 0.5 mg-dose) [semaglutide (0.25 or 0.5mg -dos)], Lisinopril , Metformin  and related, and Pioglitazone  hydrochloride    Review of Systems  Constitutional:  Positive for chills.  Gastrointestinal:  Positive for abdominal distention, abdominal pain, diarrhea, nausea and vomiting.  Musculoskeletal:  Positive for arthralgias.    Updated Vital Signs BP 104/60 (BP Location: Right Arm)   Pulse (!) 113   Temp 98.6 F (37 C) (Oral)   Resp 18   LMP 05/13/2019 Comment: patient had not had her period in a year and it came back in May and June 2020  SpO2 95%   Physical Exam Vitals and nursing note reviewed.  Constitutional:      General: She is not in acute distress.    Appearance: She is well-developed. She is obese. She is not toxic-appearing.  HENT:     Head: Normocephalic and atraumatic.     Right Ear: External ear normal.     Left Ear: External ear normal.     Nose: Nose normal.  Eyes:     Conjunctiva/sclera: Conjunctivae normal.  Cardiovascular:     Rate and Rhythm: Regular rhythm. Tachycardia present.     Pulses: Normal pulses.     Heart sounds: Normal heart sounds. No murmur heard. Pulmonary:     Effort: Pulmonary effort is normal. No respiratory distress.     Breath sounds: Normal breath sounds.  Abdominal:     General: There is distension.     Palpations: Abdomen is soft.     Tenderness: There is no abdominal tenderness.  Musculoskeletal:        General: No swelling.     Cervical back: Neck supple.  Skin:    General: Skin is warm and dry.     Capillary Refill: Capillary refill takes less than 2 seconds.  Neurological:      General: No focal deficit present.     Mental Status: She is alert and oriented to person, place, and time.  Psychiatric:        Mood and Affect: Mood normal.     (all labs ordered are listed, but only abnormal results are displayed) Labs Reviewed - No data to display  EKG: None  Radiology: No results found.  {Document cardiac monitor, telemetry assessment procedure when appropriate:32947} Procedures   Medications Ordered in the ED - No data to display    {Click here for ABCD2, HEART and other calculators REFRESH Note before signing:1}  Medical Decision Making Amount and/or Complexity of Data Reviewed Labs: ordered. Radiology: ordered.  Risk Prescription drug management.   This patient presents to the ED for concern of lupus flare differential diagnosis includes lupus, SBO,  viral GI illness, diverticulitis, appendicitis, choledocholithiasis, acute cholecystitis    Additional history obtained   Additional history obtained from Electronic Medical Record External records from outside source obtained and reviewed including rheumatology note   Lab Tests:  I Ordered, and personally interpreted labs.  The pertinent results include: Anemia at 10.2 which is chronic per historical values   Imaging Studies ordered:  I ordered imaging studies including CT abdomen pelvis with contrast I independently visualized and interpreted imaging which showed *** I agree with the radiologist interpretation   Medicines ordered and prescription drug management:  I ordered medication including Zofran     I have reviewed the patients home medicines and have made adjustments as needed   Problem List / ED Course:  ***  {Document critical care time when appropriate  Document review of labs and clinical decision tools ie CHADS2VASC2, etc  Document your independent review of radiology images and any outside records  Document your discussion with family  members, caretakers and with consultants  Document social determinants of health affecting pt's care  Document your decision making why or why not admission, treatments were needed:32947:::1}   Final diagnoses:  None    ED Discharge Orders     None

## 2024-07-23 ENCOUNTER — Encounter (HOSPITAL_COMMUNITY): Payer: Self-pay

## 2024-07-23 ENCOUNTER — Telehealth: Payer: Self-pay | Admitting: Internal Medicine

## 2024-07-23 ENCOUNTER — Emergency Department (HOSPITAL_COMMUNITY)

## 2024-07-23 DIAGNOSIS — R112 Nausea with vomiting, unspecified: Secondary | ICD-10-CM

## 2024-07-23 DIAGNOSIS — E1110 Type 2 diabetes mellitus with ketoacidosis without coma: Secondary | ICD-10-CM | POA: Diagnosis not present

## 2024-07-23 DIAGNOSIS — N390 Urinary tract infection, site not specified: Secondary | ICD-10-CM

## 2024-07-23 DIAGNOSIS — E8729 Other acidosis: Secondary | ICD-10-CM

## 2024-07-23 HISTORY — DX: Other acidosis: E87.29

## 2024-07-23 LAB — BETA-HYDROXYBUTYRIC ACID
Beta-Hydroxybutyric Acid: 2.59 mmol/L — ABNORMAL HIGH (ref 0.05–0.27)
Beta-Hydroxybutyric Acid: 0.33 mmol/L — ABNORMAL HIGH (ref 0.05–0.27)
Beta-Hydroxybutyric Acid: 0.25 mmol/L (ref 0.05–0.27)
Beta-Hydroxybutyric Acid: 0.39 mmol/L — ABNORMAL HIGH (ref 0.05–0.27)

## 2024-07-23 LAB — URINALYSIS, ROUTINE W REFLEX MICROSCOPIC
Bilirubin Urine: NEGATIVE
Glucose, UA: >=500 mg/dL — AB
Ketones, ur: 20 mg/dL — AB
Nitrite: NEGATIVE
Protein, ur: NEGATIVE mg/dL
Specific Gravity, Urine: 1.02 (ref 1.005–1.030)
pH: 5 (ref 5.0–8.0)

## 2024-07-23 LAB — BASIC METABOLIC PANEL WITH GFR
Anion gap: 9 (ref 5–15)
Anion gap: 7 (ref 5–15)
Anion gap: 8 (ref 5–15)
Anion gap: 8 (ref 5–15)
BUN: 17 mg/dL (ref 6–20)
BUN: 16 mg/dL (ref 6–20)
BUN: 14 mg/dL (ref 6–20)
BUN: 16 mg/dL (ref 6–20)
CO2: 26 mmol/L (ref 22–32)
CO2: 28 mmol/L (ref 22–32)
CO2: 26 mmol/L (ref 22–32)
CO2: 23 mmol/L (ref 22–32)
Calcium: 8 mg/dL — ABNORMAL LOW (ref 8.9–10.3)
Calcium: 7.9 mg/dL — ABNORMAL LOW (ref 8.9–10.3)
Calcium: 8.1 mg/dL — ABNORMAL LOW (ref 8.9–10.3)
Calcium: 8.2 mg/dL — ABNORMAL LOW (ref 8.9–10.3)
Chloride: 98 mmol/L (ref 98–111)
Chloride: 101 mmol/L (ref 98–111)
Chloride: 101 mmol/L (ref 98–111)
Chloride: 103 mmol/L (ref 98–111)
Creatinine, Ser: 0.71 mg/dL (ref 0.44–1.00)
Creatinine, Ser: 0.75 mg/dL (ref 0.44–1.00)
Creatinine, Ser: 0.63 mg/dL (ref 0.44–1.00)
Creatinine, Ser: 0.6 mg/dL (ref 0.44–1.00)
GFR, Estimated: >60 mL/min (ref >60)
GFR, Estimated: >60 mL/min (ref >60)
GFR, Estimated: >60 mL/min (ref >60)
GFR, Estimated: >60 mL/min (ref >60)
Glucose, Bld: 294 mg/dL — ABNORMAL HIGH (ref 70–99)
Glucose, Bld: 172 mg/dL — ABNORMAL HIGH (ref 70–99)
Glucose, Bld: 197 mg/dL — ABNORMAL HIGH (ref 70–99)
Glucose, Bld: 318 mg/dL — ABNORMAL HIGH (ref 70–99)
Potassium: 3 mmol/L — ABNORMAL LOW (ref 3.5–5.1)
Potassium: 3.4 mmol/L — ABNORMAL LOW (ref 3.5–5.1)
Potassium: 3.7 mmol/L (ref 3.5–5.1)
Potassium: 4 mmol/L (ref 3.5–5.1)
Sodium: 133 mmol/L — ABNORMAL LOW (ref 135–145)
Sodium: 136 mmol/L (ref 135–145)
Sodium: 135 mmol/L (ref 135–145)
Sodium: 134 mmol/L — ABNORMAL LOW (ref 135–145)

## 2024-07-23 LAB — BLOOD GAS, VENOUS
Acid-base deficit: 0.8 mmol/L (ref 0.0–2.0)
Bicarbonate: 25.4 mmol/L (ref 20.0–28.0)
Drawn by: 8707
O2 Saturation: 70.2 %
Patient temperature: 37
pCO2, Ven: 47 mmHg (ref 44–60)
pH, Ven: 7.34 (ref 7.25–7.43)
pO2, Ven: 42 mmHg (ref 32–45)

## 2024-07-23 LAB — CBG MONITORING, ED
Glucose-Capillary: 330 mg/dL — ABNORMAL HIGH (ref 70–99)
Glucose-Capillary: 367 mg/dL — ABNORMAL HIGH (ref 70–99)
Glucose-Capillary: 287 mg/dL — ABNORMAL HIGH (ref 70–99)
Glucose-Capillary: 158 mg/dL — ABNORMAL HIGH (ref 70–99)
Glucose-Capillary: 172 mg/dL — ABNORMAL HIGH (ref 70–99)
Glucose-Capillary: 180 mg/dL — ABNORMAL HIGH (ref 70–99)
Glucose-Capillary: 130 mg/dL — ABNORMAL HIGH (ref 70–99)
Glucose-Capillary: 174 mg/dL — ABNORMAL HIGH (ref 70–99)

## 2024-07-23 LAB — COMPREHENSIVE METABOLIC PANEL WITH GFR
ALT: 24 U/L (ref 0–44)
AST: 22 U/L (ref 15–41)
Albumin: 2.8 g/dL — ABNORMAL LOW (ref 3.5–5.0)
Alkaline Phosphatase: 67 U/L (ref 38–126)
Anion gap: 18 — ABNORMAL HIGH (ref 5–15)
BUN: 21 mg/dL — ABNORMAL HIGH (ref 6–20)
CO2: 22 mmol/L (ref 22–32)
Calcium: 8.4 mg/dL — ABNORMAL LOW (ref 8.9–10.3)
Chloride: 92 mmol/L — ABNORMAL LOW (ref 98–111)
Creatinine, Ser: 1.15 mg/dL — ABNORMAL HIGH (ref 0.44–1.00)
GFR, Estimated: 56 mL/min — ABNORMAL LOW (ref >60)
Glucose, Bld: 530 mg/dL (ref 70–99)
Potassium: 3.6 mmol/L (ref 3.5–5.1)
Sodium: 132 mmol/L — ABNORMAL LOW (ref 135–145)
Total Bilirubin: 1.1 mg/dL (ref 0.0–1.2)
Total Protein: 6.4 g/dL — ABNORMAL LOW (ref 6.5–8.1)

## 2024-07-23 LAB — PHOSPHORUS
Phosphorus: 1.6 mg/dL — ABNORMAL LOW (ref 2.5–4.6)
Phosphorus: 2.3 mg/dL — ABNORMAL LOW (ref 2.5–4.6)

## 2024-07-23 LAB — FOLATE: Folate: 20.6 ng/mL (ref >5.9)

## 2024-07-23 LAB — MAGNESIUM
Magnesium: 1.4 mg/dL — ABNORMAL LOW (ref 1.7–2.4)
Magnesium: 2.8 mg/dL — ABNORMAL HIGH (ref 1.7–2.4)

## 2024-07-23 LAB — GLUCOSE, CAPILLARY
Glucose-Capillary: 313 mg/dL — ABNORMAL HIGH (ref 70–99)
Glucose-Capillary: 295 mg/dL — ABNORMAL HIGH (ref 70–99)

## 2024-07-23 LAB — IRON AND TIBC
Iron: 17 ug/dL — ABNORMAL LOW (ref 28–170)
Saturation Ratios: 7 % — ABNORMAL LOW (ref 10.4–31.8)
TIBC: 246 ug/dL — ABNORMAL LOW (ref 250–450)
UIBC: 229 ug/dL

## 2024-07-23 LAB — VITAMIN D 25 HYDROXY (VIT D DEFICIENCY, FRACTURES): Vit D, 25-Hydroxy: 25.09 ng/mL — ABNORMAL LOW (ref 30–100)

## 2024-07-23 MED ORDER — MONTELUKAST SODIUM 10 MG PO TABS
10.0000 mg | ORAL_TABLET | Freq: Every day | ORAL | Status: DC
  Administered 2024-07-23 – 2024-07-24 (×2): 10 mg via ORAL
  Filled 2024-07-23 (×3): qty 1

## 2024-07-23 MED ORDER — IOHEXOL 300 MG/ML  SOLN
100.0000 mL | Freq: Once | INTRAMUSCULAR | Status: AC | PRN
  Administered 2024-07-23: 100 mL via INTRAVENOUS

## 2024-07-23 MED ORDER — POTASSIUM CHLORIDE 10 MEQ/100ML IV SOLN
10.0000 meq | INTRAVENOUS | Status: AC
  Administered 2024-07-23 (×2): 10 meq via INTRAVENOUS
  Filled 2024-07-23 (×2): qty 100

## 2024-07-23 MED ORDER — SODIUM CHLORIDE 0.9 % IV SOLN
1.0000 g | INTRAVENOUS | Status: DC
  Administered 2024-07-24: 1 g via INTRAVENOUS
  Filled 2024-07-23: qty 10

## 2024-07-23 MED ORDER — POTASSIUM PHOSPHATES 15 MMOLE/5ML IV SOLN
30.0000 mmol | Freq: Once | INTRAVENOUS | Status: AC
  Administered 2024-07-23: 30 mmol via INTRAVENOUS
  Filled 2024-07-23: qty 10

## 2024-07-23 MED ORDER — PNEUMOCOCCAL 20-VAL CONJ VACC 0.5 ML IM SUSY
0.5000 mL | PREFILLED_SYRINGE | INTRAMUSCULAR | Status: AC
  Administered 2024-07-24: 0.5 mL via INTRAMUSCULAR
  Filled 2024-07-23: qty 0.5

## 2024-07-23 MED ORDER — SODIUM CHLORIDE 0.9 % IV BOLUS
1000.0000 mL | Freq: Once | INTRAVENOUS | Status: AC
  Administered 2024-07-23: 1000 mL via INTRAVENOUS

## 2024-07-23 MED ORDER — DEXTROSE 50 % IV SOLN: 0.0000 mL | INTRAVENOUS | Status: DC | PRN

## 2024-07-23 MED ORDER — CYANOCOBALAMIN 1000 MCG/ML IJ SOLN
1000.0000 ug | Freq: Every day | INTRAMUSCULAR | Status: DC
  Administered 2024-07-24 – 2024-07-25 (×2): 1000 ug via INTRAMUSCULAR
  Filled 2024-07-23 (×3): qty 1

## 2024-07-23 MED ORDER — INSULIN GLARGINE-YFGN 100 UNIT/ML ~~LOC~~ SOLN: 20.0000 [IU] | Freq: Two times a day (BID) | SUBCUTANEOUS | Status: DC

## 2024-07-23 MED ORDER — POTASSIUM CHLORIDE CRYS ER 20 MEQ PO TBCR
40.0000 meq | EXTENDED_RELEASE_TABLET | Freq: Once | ORAL | Status: DC
Start: 2024-07-23 — End: 2024-07-23

## 2024-07-23 MED ORDER — LACTATED RINGERS IV BOLUS: 20.0000 mL/kg | Freq: Once | INTRAVENOUS | Status: DC

## 2024-07-23 MED ORDER — SODIUM CHLORIDE 0.9 % IV SOLN
1.0000 g | Freq: Once | INTRAVENOUS | Status: AC
  Administered 2024-07-23: 1 g via INTRAVENOUS
  Filled 2024-07-23: qty 10

## 2024-07-23 MED ORDER — INSULIN GLARGINE 100 UNIT/ML ~~LOC~~ SOLN
20.0000 [IU] | Freq: Two times a day (BID) | SUBCUTANEOUS | Status: DC
  Administered 2024-07-23 – 2024-07-25 (×5): 20 [IU] via SUBCUTANEOUS
  Filled 2024-07-23 (×6): qty 0.2

## 2024-07-23 MED ORDER — ACETAMINOPHEN 325 MG PO TABS
650.0000 mg | ORAL_TABLET | Freq: Four times a day (QID) | ORAL | Status: DC | PRN
  Administered 2024-07-23: 650 mg via ORAL
  Filled 2024-07-23: qty 2

## 2024-07-23 MED ORDER — DEXTROSE IN LACTATED RINGERS 5 % IV SOLN: INTRAVENOUS | Status: AC

## 2024-07-23 MED ORDER — LACTATED RINGERS IV SOLN: INTRAVENOUS | Status: AC

## 2024-07-23 MED ORDER — VITAMIN B-12 1000 MCG PO TABS: 1000.0000 ug | ORAL_TABLET | Freq: Every day | ORAL | Status: DC

## 2024-07-23 MED ORDER — MAGNESIUM SULFATE 2 GM/50ML IV SOLN
2.0000 g | Freq: Once | INTRAVENOUS | Status: AC
  Administered 2024-07-23: 2 g via INTRAVENOUS
  Filled 2024-07-23: qty 50

## 2024-07-23 MED ORDER — ATORVASTATIN CALCIUM 40 MG PO TABS
40.0000 mg | ORAL_TABLET | Freq: Every day | ORAL | Status: DC
  Administered 2024-07-23 – 2024-07-25 (×3): 40 mg via ORAL
  Filled 2024-07-23 (×3): qty 1

## 2024-07-23 MED ORDER — INSULIN ASPART 100 UNIT/ML IJ SOLN
0.0000 [IU] | Freq: Three times a day (TID) | INTRAMUSCULAR | Status: DC
  Administered 2024-07-23: 3 [IU] via SUBCUTANEOUS
  Administered 2024-07-23 – 2024-07-24 (×3): 11 [IU] via SUBCUTANEOUS
  Administered 2024-07-24: 5 [IU] via SUBCUTANEOUS
  Administered 2024-07-25: 3 [IU] via SUBCUTANEOUS
  Administered 2024-07-25: 5 [IU] via SUBCUTANEOUS
  Filled 2024-07-23: qty 0.15

## 2024-07-23 MED ORDER — INSULIN GLARGINE 100 UNIT/ML ~~LOC~~ SOLN
20.0000 [IU] | Freq: Two times a day (BID) | SUBCUTANEOUS | Status: DC
  Administered 2024-07-23: 20 [IU] via SUBCUTANEOUS
  Filled 2024-07-23: qty 0.2

## 2024-07-23 MED ORDER — ALBUTEROL SULFATE (2.5 MG/3ML) 0.083% IN NEBU: 3.0000 mL | INHALATION_SOLUTION | Freq: Four times a day (QID) | RESPIRATORY_TRACT | Status: DC | PRN

## 2024-07-23 MED ORDER — AMLODIPINE BESYLATE 10 MG PO TABS
10.0000 mg | ORAL_TABLET | Freq: Every day | ORAL | Status: DC
  Administered 2024-07-23 – 2024-07-25 (×3): 10 mg via ORAL
  Filled 2024-07-23: qty 1
  Filled 2024-07-23: qty 2
  Filled 2024-07-23: qty 1

## 2024-07-23 MED ORDER — HYDROXYCHLOROQUINE SULFATE 200 MG PO TABS
400.0000 mg | ORAL_TABLET | Freq: Every day | ORAL | Status: DC
  Administered 2024-07-23 – 2024-07-25 (×3): 400 mg via ORAL
  Filled 2024-07-23 (×3): qty 2

## 2024-07-23 MED ORDER — ESCITALOPRAM OXALATE 20 MG PO TABS
20.0000 mg | ORAL_TABLET | Freq: Every day | ORAL | Status: DC
  Administered 2024-07-23 – 2024-07-25 (×3): 20 mg via ORAL
  Filled 2024-07-23 (×3): qty 2

## 2024-07-23 MED ORDER — ONDANSETRON HCL 4 MG/2ML IJ SOLN: 4.0000 mg | Freq: Four times a day (QID) | INTRAMUSCULAR | Status: DC | PRN

## 2024-07-23 MED ORDER — POTASSIUM CHLORIDE 10 MEQ/100ML IV SOLN
10.0000 meq | INTRAVENOUS | Status: AC
  Administered 2024-07-23 (×4): 10 meq via INTRAVENOUS
  Filled 2024-07-23 (×4): qty 100

## 2024-07-23 MED ORDER — ENOXAPARIN SODIUM 40 MG/0.4ML IJ SOSY
40.0000 mg | PREFILLED_SYRINGE | INTRAMUSCULAR | Status: DC
  Administered 2024-07-23 – 2024-07-25 (×3): 40 mg via SUBCUTANEOUS
  Filled 2024-07-23 (×3): qty 0.4

## 2024-07-23 MED ORDER — INSULIN REGULAR(HUMAN) IN NACL 100-0.9 UT/100ML-% IV SOLN
INTRAVENOUS | Status: DC
  Administered 2024-07-23: 15 [IU]/h via INTRAVENOUS
  Filled 2024-07-23: qty 100

## 2024-07-23 MED ORDER — ACETAMINOPHEN 650 MG RE SUPP: 650.0000 mg | Freq: Four times a day (QID) | RECTAL | Status: DC | PRN

## 2024-07-23 NOTE — Plan of Care (Addendum)
 H&P reviewed, patient was admitted after midnight today. Patient was admitted due to diabetic ketoacidosis.  Patient was having nausea and vomiting so she did not use insulin  for past 2 days. # DKA resolving, transition to subcu insulin .  Continue sliding scale, monitor CBG and continue diabetic diet.  # Hypokalemia, potassium repleted. # Hypophosphatemia, Phos repleted. # Hypomagnesemia, mag repleted. Check electrolytes daily and replete as needed.  # Vitamin B12 level 218, goal 400. Started vitamin B12 1000 mcg IM injection daily during hospital stay, followed by oral supplement.  Follow-up PCP to repeat vitamin B12 level after 3 to 6 months.  We will continue current treatment and follow along. No charge note, same-day admission.

## 2024-07-23 NOTE — Inpatient Diabetes Management (Signed)
 Inpatient Diabetes Program Recommendations  AACE/ADA: New Consensus Statement on Inpatient Glycemic Control (2015)  Target Ranges:  Prepandial:   less than 140 mg/dL      Peak postprandial:   less than 180 mg/dL (1-2 hours)      Critically ill patients:  140 - 180 mg/dL   Lab Results  Component Value Date   GLUCAP 174 (H) 07/23/2024   HGBA1C 10.2 (A) 05/20/2024    Review of Glycemic Control  Diabetes history: DM2 Outpatient Diabetes medications: Amaryl  6 mg daily QAM, U-500 40 units in am, 60 units at lunch and 60 units at dinner Current orders for Inpatient glycemic control: IV insulin  per [employer] for DKA  HgbA1C 10.2% Consult for CGM, DKA  Inpatient Diabetes Program Recommendations:    Spoke with pt at bedside in ED regarding her diabetes and glucose control. Asked pt if she was interested in learning about CGMs and she replied, I don't know why everybody thinks I should have a glucose sensor. I don't mind sticking my fingers for getting blood sugars. Verified insulin  and DM meds. Discussed HgbA1C of 10.2% and goal of 7%. Explained how hyperglycemia leads to damage within blood vessels which lead to the common complications seen with uncontrolled diabetes. Stressed to the patient the importance of improving glycemic control to prevent further complications from uncontrolled diabetes. Discussed impact of nutrition, exercise, stress, sickness, and medications on diabetes control.  Discussed carbohydrates, along with portion sizes.  Pt states she has no further questions/concerns.  Pt is NOT interested in CGMS at this time. Will follow glucose trends.  Thank you. Shona Brandy, RD, LDN, CDCES Inpatient Diabetes Coordinator [phone]

## 2024-07-23 NOTE — H&P (Signed)
 History and Physical    Holly Ortiz FMW:[id number] DOB: April 02, 1966 DOA: 07/22/2024  PCP: Berneta Elsie Sayre, MD  Patient coming from: Home  Chief Complaint: Nausea, vomiting  HPI: Holly Ortiz is a 58 y.o. 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  Denies any falls or trauma to her back.  She reports history of chronic urticaria and had hives, now resolved.  No other skin rash at this time and no oral ulcers.  Reports history of chronic cough for several months.  Denies shortness of breath or chest pain.  Reports having 1 episode of loose stool yesterday but no diarrhea.  She takes glimepiride  for her diabetes.  Also takes Humulin  40 units with breakfast, 60 units before dinner, and 60 units at bedtime.  Reports compliance with her home medication regimen.  She reports history of kidney stone status post lithotripsy 6 weeks ago.  She is endorsing urinary frequency and urgency.  ED Course: EMS noted CBG 520 and patient was given 4 mg Zofran  and 350 mL of normal saline prior to arrival.  Vital signs on arrival to the ED: Temperature 98.6 F, pulse 113, respiratory rate 18, blood pressure 104/60, SpO2 95% on room air.  Labs showing no leukocytosis, hemoglobin 10.2 (stable), sodium 132, potassium 3.6, chloride 92, glucose 530, bicarb 22, anion gap 18, BUN 21, creatinine 1.15 (baseline 0.6-0.9), calcium  8.4, albumin  2.8, normal LFTs, beta hydroxybutyric acid 2.59, VBG with pH 7.34.  UA with >500 glucose, 20 ketones, and signs of infection (trace leukocytes, 21-50 WBCs, and many bacteria).  CT abdomen pelvis showing hepatic steatosis, aortoiliac  atherosclerosis, and no acute findings.  Patient was given Zofran , ceftriaxone , 2 L IV fluid boluses, IV potassium, and started on insulin  drip per DKA protocol.  Review of Systems:  Review of Systems  All other systems reviewed and are negative.   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-I)    Anxiety    on meds   Asthma    childhood   Cataract 2015   bilateral   Constipation    takes Miralax  daily   Depression    on meds   Diabetes mellitus    type II-on meds   Hyperlipidemia    on meds   Hypertension    on meds   Kidney stone    Lupus 2022   dx 02/2021   Seasonal allergies    Urticaria, chronic    UTI (lower urinary tract infection)     Past Surgical History:  Procedure Laterality Date   ABDOMINAL HYSTERECTOMY  08/07/2019   BREAST ENHANCEMENT SURGERY  1993   breast lift/reduction   CATARACT EXTRACTION, BILATERAL Bilateral 2014   CATARACT EXTRACTION, BILATERAL Bilateral 04/2022   COLONOSCOPY  2014   in [place]   EXTRACORPOREAL SHOCK WAVE LITHOTRIPSY Left 06/16/2024   Procedure: LITHOTRIPSY, ESWL;  Surgeon: Lovie Arlyss CROME, MD;  Location: [facility];  Service: Urology;  Laterality: Left;   LAPAROSCOPIC VAGINAL HYSTERECTOMY WITH SALPINGO OOPHORECTOMY Bilateral 08/07/2019   Procedure: LAPAROSCOPIC ASSISTED VAGINAL HYSTERECTOMY WITH SALPINGO OOPHORECTOMY, possible abdominal hysterectomy;  Surgeon: Curlene Agent, MD;  Location: [facility] OR;  Service: Gynecology;  Laterality: Bilateral;  possible abdominal hysterectomy pt is diabetic and hypertensive   SOFT TISSUE  MASS EXCISION Right 2016   [place]   TUBAL LIGATION  1995   tummy tuck  1993     reports that she has never smoked. She has never been exposed to tobacco smoke. She has never used smokeless tobacco. She reports that she does not drink alcohol and does not use drugs.  Allergies  Allergen Reactions   Ozempic  (0.25 Or 0.5 Mg-Dose) [Semaglutide (0.25 Or  0.5mg -Dos)] Nausea And Vomiting   Lisinopril  Swelling and Other (See Comments)    Angioedema, including likely GI involvement   Metformin  And Related Diarrhea and Nausea And Vomiting   Pioglitazone  Hydrochloride Other (See Comments)    Reaction not stated    Family History  Problem Relation Age of Onset   Colon polyps Mother 3   Colon cancer Mother 7   Hypertension Mother    Diabetes Father    Hypertension Father    COPD Father        Cause of death   Hypertension Sister    Diabetes Sister    Cancer Sister        Brain glioblastoma   Diabetes Sister    Hypertension Brother    Hyperlipidemia Other    Heart disease Other    Esophageal cancer Neg Hx    Stomach cancer Neg Hx    Rectal cancer Neg Hx     Prior to Admission medications   Medication Sig Start Date End Date Taking? Authorizing Provider  acetaminophen  (TYLENOL ) 500 MG tablet Take 2 tablets (1,000 mg total) by mouth every 6 (six) hours as needed. 08/08/19  Yes Tomblin, James, MD  albuterol  (VENTOLIN  HFA) 108 (90 Base) MCG/ACT inhaler Inhale 2 puffs into the lungs every 6 (six) hours as needed for wheezing or shortness of breath. 02/25/24  Yes Amin, Ankit C, MD  amLODipine  (NORVASC ) 10 MG tablet TAKE 1 TABLET BY MOUTH EVERY DAY 07/14/24  Yes Berneta Elsie Sayre, MD  atorvastatin  (LIPITOR) 40 MG tablet Take 1 tablet (40 mg total) by mouth daily. 04/24/23  Yes Berneta Elsie Sayre, MD  Cholecalciferol (VITAMIN D3) 50 MCG (2000 UT) TABS Take 2,000 Units by mouth daily.   Yes [provider]  cyclobenzaprine  (FLEXERIL ) 5 MG tablet TAKE 1 TABLET BY MOUTH AT BEDTIME AS NEEDED FOR MUSCLE SPASMS. 05/26/24  Yes Rice, Lonni ORN, MD  escitalopram  (LEXAPRO ) 20 MG tablet TAKE 1 TABLET BY MOUTH EVERY DAY 07/14/24  Yes Berneta Elsie Sayre, MD  glimepiride  (AMARYL ) 4 MG tablet Take 1.5 tablets (6 mg total) by mouth daily before breakfast. 05/20/24  Yes Thapa, [place], MD  hydroxychloroquine  (PLAQUENIL ) 200 MG tablet Take 2  tablets (400 mg total) by mouth daily. 05/01/24  Yes Rice, Lonni ORN, MD  insulin  regular human CONCENTRATED (HUMULIN  R U-500 KWIKPEN) 500 UNIT/ML KwikPen INJECT 40 UNITS UNDER THE SKIN BEFORE BREAKFAST, 20-25 UNITS BEFORE LUNCH AND 40 - 60 UNITS AT SUPPER. Maximum 125 units/day. 05/20/24  Yes Thapa, [place], MD  Melatonin 5 MG TABS Take 5 mg by mouth at bedtime as needed (FOR SLEEP).   Yes [provider]  montelukast  (SINGULAIR ) 10 MG tablet TAKE 1 TABLET BY MOUTH EVERYDAY AT BEDTIME 10/17/23  Yes Berneta Elsie Sayre, MD  polyethylene glycol (MIRALAX  / GLYCOLAX ) 17 g packet Take 17 g by mouth daily as needed for mild constipation.   Yes [provider]  vitamin B-12 (CYANOCOBALAMIN ) 1000 MCG tablet Take 1,000 mcg by mouth daily.   Yes [provider]  zinc gluconate 50 MG tablet Take 50  mg by mouth daily.    Yes [provider]  Accu-Chek Softclix Lancets lancets 1 each 3 (three) times daily. 03/08/24   [provider]  Blood Glucose Monitoring Suppl DEVI 1 each by Does not apply route in the morning, at noon, and at bedtime. May substitute to any manufacturer covered by patient's insurance. 02/12/24   Thapa, [place], MD  diclofenac  (VOLTAREN ) 75 MG EC tablet Take 1 tablet (75 mg total) by mouth 2 (two) times daily as needed. Patient not taking: Reported on 07/23/2024 05/01/24   Jeannetta Lonni ORN, MD  fenofibrate  (TRICOR ) 145 MG tablet Take 1 tablet (145 mg total) by mouth daily. Patient not taking: Reported on 07/23/2024 02/08/21   Von [place], MD  Insulin  Pen Needle (BD PEN NEEDLE NANO 2ND GEN) 32G X 4 MM MISC Use four times daily to inject insulin . 07/30/19   Von [place], MD  phenazopyridine  (PYRIDIUM ) 200 MG tablet Take 1 tablet (200 mg total) by mouth 3 (three) times daily. 02/13/24   Reddick, Johnathan B, NP  tamsulosin  (FLOMAX ) 0.4 MG CAPS capsule Take 1 capsule (0.4 mg total) by mouth daily after supper. Patient not taking: Reported on 07/23/2024 06/16/24    Lovie Arlyss CROME, MD  triamcinolone  ointment (KENALOG ) 0.5 % Apply 1 Application topically 2 (two) times daily as needed. Patient not taking: Reported on 07/23/2024 05/01/24   Jeannetta Lonni ORN, MD  triamterene -hydrochlorothiazide  (MAXZIDE -25) 37.5-25 MG tablet TAKE 1 TABLET BY MOUTH EVERY DAY Patient not taking: Reported on 07/23/2024 10/17/23   Berneta Elsie Sayre, MD    Physical Exam: Vitals:   07/22/24 2115 07/23/24 0142  BP: 104/60 (!) 176/60  Pulse: (!) 113 (!) 105  Resp: 18 18  Temp: 98.6 F (37 C) 98.2 F (36.8 C)  TempSrc: Oral Oral  SpO2: 95% 94%    Physical Exam Vitals reviewed.  Constitutional:      General: She is not in acute distress. HENT:     Head: Normocephalic and atraumatic.     Mouth/Throat:     Comments: No oral ulcers Eyes:     Extraocular Movements: Extraocular movements intact.  Cardiovascular:     Rate and Rhythm: Normal rate and regular rhythm.     Heart sounds: Normal heart sounds.  Pulmonary:     Effort: Pulmonary effort is normal. No respiratory distress.     Breath sounds: Normal breath sounds.  Abdominal:     General: Bowel sounds are normal. There is no distension.     Palpations: Abdomen is soft.     Tenderness: There is no abdominal tenderness. There is no guarding.  Musculoskeletal:     Cervical back: Normal range of motion.     Right lower leg: No edema.     Left lower leg: No edema.  Skin:    General: Skin is warm and dry.     Findings: No rash.  Neurological:     General: No focal deficit present.     Mental Status: She is alert and oriented to person, place, and time.     Labs on Admission: I have personally reviewed following labs and imaging studies  CBC: Recent Labs  Lab 07/22/24 2332  WBC 8.4  NEUTROABS 7.3  HGB 10.2*  HCT 31.9*  MCV 81.4  PLT 236   Basic Metabolic Panel: Recent Labs  Lab 07/22/24 2332  NA 132*  K 3.6  CL 92*  CO2 22  GLUCOSE 530*  BUN 21*  CREATININE 1.15*  CALCIUM  8.4*  GFR: CrCl cannot be calculated (Unknown ideal weight.). Liver Function Tests: Recent Labs  Lab 07/22/24 2332  AST 22  ALT 24  ALKPHOS 67  BILITOT 1.1  PROT 6.4*  ALBUMIN  2.8*   No results for input(s): LIPASE, AMYLASE in the last 168 hours. No results for input(s): AMMONIA in the last 168 hours. Coagulation Profile: No results for input(s): INR, PROTIME in the last 168 hours. Cardiac Enzymes: No results for input(s): CKTOTAL, CKMB, CKMBINDEX, TROPONINI in the last 168 hours. BNP (last 3 results) No results for input(s): PROBNP in the last 8760 hours. HbA1C: No results for input(s): HGBA1C in the last 72 hours. CBG: Recent Labs  Lab 07/23/24 0109  GLUCAP 330*   Lipid Profile: No results for input(s): CHOL, HDL, LDLCALC, TRIG, CHOLHDL, LDLDIRECT in the last 72 hours. Thyroid  Function Tests: No results for input(s): TSH, T4TOTAL, FREET4, T3FREE, THYROIDAB in the last 72 hours. Anemia Panel: No results for input(s): VITAMINB12, FOLATE, FERRITIN, TIBC, IRON , RETICCTPCT in the last 72 hours. Urine analysis:    Component Value Date/Time   COLORURINE YELLOW 07/23/2024 0046   APPEARANCEUR HAZY (A) 07/23/2024 0046   LABSPEC 1.020 07/23/2024 0046   PHURINE 5.0 07/23/2024 0046   GLUCOSEU >=500 (A) 07/23/2024 0046   GLUCOSEU NEGATIVE 05/12/2024 1412   HGBUR SMALL (A) 07/23/2024 0046   BILIRUBINUR NEGATIVE 07/23/2024 0046   BILIRUBINUR neg 02/21/2024 1519   KETONESUR 20 (A) 07/23/2024 0046   PROTEINUR NEGATIVE 07/23/2024 0046   UROBILINOGEN 0.2 05/12/2024 1412   NITRITE NEGATIVE 07/23/2024 0046   LEUKOCYTESUR TRACE (A) 07/23/2024 0046    Radiological Exams on Admission: CT ABDOMEN PELVIS W CONTRAST Result Date: 07/23/2024 CLINICAL DATA:  Abdominal pain, vomiting EXAM: CT ABDOMEN AND PELVIS WITH CONTRAST TECHNIQUE: Multidetector CT imaging of the abdomen and pelvis was performed using the standard protocol following  bolus administration of intravenous contrast. RADIATION DOSE REDUCTION: This exam was performed according to the departmental dose-optimization program which includes automated exposure control, adjustment of the mA and/or kV according to patient size and/or use of iterative reconstruction technique. CONTRAST:  OMNIPAQUE  IOHEXOL  300 MG/ML  SOLN COMPARISON:  02/24/2024 FINDINGS: Lower chest: No acute findings Hepatobiliary: Diffuse low-density throughout the liver compatible with fatty infiltration. No focal abnormality. Gallbladder unremarkable. Pancreas: No focal abnormality or ductal dilatation. Spleen: No focal abnormality.  Normal size. Adrenals/Urinary Tract: Adrenal glands normal. Left lower pole stone again seen, unchanged since prior study. No ureteral stones or hydronephrosis. No suspicious renal abnormality. Urinary bladder decompressed, unremarkable. Stomach/Bowel: Normal appendix. Stomach, large and small bowel grossly unremarkable. Vascular/Lymphatic: Of aortoiliac atherosclerosis. No evidence of aneurysm or adenopathy. Reproductive: Prior hysterectomy.  No adnexal masses. Other: No free fluid or free air. Musculoskeletal: No acute bony abnormality. IMPRESSION: No acute findings in the abdomen or pelvis. Hepatic steatosis. Aortoiliac atherosclerosis. Electronically Signed   By: Franky Crease M.D.   On: 07/23/2024 01:06    Assessment and Plan  DKA in the setting of poorly controlled insulin -dependent type 2 diabetes A1c 10.2 on 05/20/2024.  Presenting with glucose in the 500s with elevated anion gap, ketones on UA, and beta hydroxybutyric acid elevated.  Bicarb level and pH normal.  Suspect this is due to DKA with concurrent metabolic alkalosis due to vomiting or volume contraction.  Keep n.p.o. and continue insulin  drip and IV fluids per DKA protocol.  Frequent CBG checks per Endo tool.  Monitor BMP and beta hydroxybutyric acid level every 4 hours.  Possible UTI Patient is endorsing urinary  frequency  and urgency.  UA with trace leukocytes, 21-50 WBCs, and many bacteria.  No fever or leukocytosis.  Continue ceftriaxone  and urine culture ordered.  Nausea, vomiting, generalized abdominal pain Symptoms likely related to DKA and possible UTI.  Normal LFTs.  CT abdomen pelvis without any acute findings which would explain these symptoms.  Continue management of DKA and UTI as mentioned above.  IV fluid hydration, antiemetic as needed (EKG ordered to check QT interval).  Mild AKI Likely due to dehydration.  Continue IV fluids and monitor renal function.  Avoid nephrotoxic agents.  Undifferentiated connective tissue disease Patient is seen by rheumatologist Dr. Jeannetta.  Continue Plaquenil .  Hypertension Continue amlodipine .  Hyperlipidemia Continue Lipitor.  Anxiety/depression Continue Lexapro .  Asthma Stable, no signs of acute exacerbation.  Continue home meds.  Chronic anemia Hemoglobin stable.  DVT prophylaxis: Lovenox  Code Status: Full Code (discussed with the patient) Level of care: Step Down Unit Admission status: It is my clinical opinion that referral for OBSERVATION is reasonable and necessary in this patient based on the above information provided. The aforementioned taken together are felt to place the patient at high risk for further clinical deterioration. However, it is anticipated that the patient may be medically stable for discharge from the hospital within 24 to 48 hours.  Editha Ram MD [facility]  If 7PM-7AM, please contact night-coverage [email or web address]  07/23/2024, 2:37 AM

## 2024-07-23 NOTE — Telephone Encounter (Signed)
 Pt called wanting an update on her FMLA paper work. Pt is currently in the hospital. Pt stated if she would get a call back that would be great.

## 2024-07-24 ENCOUNTER — Telehealth: Payer: Self-pay | Admitting: Student

## 2024-07-24 ENCOUNTER — Telehealth: Payer: Self-pay | Admitting: Pharmacy Technician

## 2024-07-24 DIAGNOSIS — D509 Iron deficiency anemia, unspecified: Secondary | ICD-10-CM | POA: Insufficient documentation

## 2024-07-24 DIAGNOSIS — E1110 Type 2 diabetes mellitus with ketoacidosis without coma: Secondary | ICD-10-CM | POA: Diagnosis not present

## 2024-07-24 LAB — BASIC METABOLIC PANEL WITH GFR
Anion gap: 9 (ref 5–15)
BUN: 13 mg/dL (ref 6–20)
CO2: 25 mmol/L (ref 22–32)
Calcium: 8.1 mg/dL — ABNORMAL LOW (ref 8.9–10.3)
Chloride: 101 mmol/L (ref 98–111)
Creatinine, Ser: 0.6 mg/dL (ref 0.44–1.00)
GFR, Estimated: >60 mL/min (ref >60)
Glucose, Bld: 310 mg/dL — ABNORMAL HIGH (ref 70–99)
Potassium: 3.7 mmol/L (ref 3.5–5.1)
Sodium: 135 mmol/L (ref 135–145)

## 2024-07-24 LAB — CBC
HCT: 28.3 % — ABNORMAL LOW (ref 36.0–46.0)
Hemoglobin: 8.5 g/dL — ABNORMAL LOW (ref 12.0–15.0)
MCH: 25.4 pg — ABNORMAL LOW (ref 26.0–34.0)
MCHC: 30 g/dL (ref 30.0–36.0)
MCV: 84.5 fL (ref 80.0–100.0)
Platelets: 223 K/uL (ref 150–400)
RBC: 3.35 MIL/uL — ABNORMAL LOW (ref 3.87–5.11)
RDW: 13.7 % (ref 11.5–15.5)
WBC: 7.6 K/uL (ref 4.0–10.5)
nRBC: 0 % (ref 0.0–0.2)

## 2024-07-24 LAB — URINE CULTURE: Culture: <10000 — AB

## 2024-07-24 LAB — PHOSPHORUS: Phosphorus: 2.3 mg/dL — ABNORMAL LOW (ref 2.5–4.6)

## 2024-07-24 LAB — GLUCOSE, CAPILLARY
Glucose-Capillary: 308 mg/dL — ABNORMAL HIGH (ref 70–99)
Glucose-Capillary: 322 mg/dL — ABNORMAL HIGH (ref 70–99)
Glucose-Capillary: 231 mg/dL — ABNORMAL HIGH (ref 70–99)
Glucose-Capillary: 254 mg/dL — ABNORMAL HIGH (ref 70–99)

## 2024-07-24 LAB — MAGNESIUM: Magnesium: 1.7 mg/dL (ref 1.7–2.4)

## 2024-07-24 MED ORDER — SODIUM CHLORIDE 0.9 % IV SOLN
300.0000 mg | Freq: Once | INTRAVENOUS | Status: AC
  Administered 2024-07-24: 300 mg via INTRAVENOUS
  Filled 2024-07-24: qty 15

## 2024-07-24 MED ORDER — VITAMIN D (ERGOCALCIFEROL) 1.25 MG (50000 UNIT) PO CAPS
50000.0000 [IU] | ORAL_CAPSULE | ORAL | Status: DC
  Administered 2024-07-24: 50000 [IU] via ORAL
  Filled 2024-07-24: qty 1

## 2024-07-24 MED ORDER — K PHOS MONO-SOD PHOS DI & MONO 155-852-130 MG PO TABS
500.0000 mg | ORAL_TABLET | Freq: Three times a day (TID) | ORAL | Status: AC
  Administered 2024-07-24 (×3): 500 mg via ORAL
  Filled 2024-07-24 (×3): qty 2

## 2024-07-24 NOTE — Plan of Care (Signed)

## 2024-07-24 NOTE — Plan of Care (Signed)
   Problem: Clinical Measurements: Goal: Ability to maintain clinical measurements within normal limits will improve Outcome: Progressing Goal: Diagnostic test results will improve Outcome: Progressing Goal: Respiratory complications will improve Outcome: Progressing Goal: Cardiovascular complication will be avoided Outcome: Progressing

## 2024-07-24 NOTE — Telephone Encounter (Signed)
 Auth Submission: NO AUTH NEEDED Site of care: Site of care: [facility] Payer: [employer] Medication & CPT/J Code(s) submitted: Venofer  (Iron  Sucrose) J1756 Diagnosis Code:  Route of submission (phone, fax, portal):  Phone # Fax # Auth type: Buy/Bill PB Units/visits requested: 300mg  x2 doses Reference number:  Approval from: 07/24/24 to 10/24/24

## 2024-07-24 NOTE — TOC Initial Note (Signed)
 Transition of Care [facility]) - Initial/Assessment Note    Patient Details  Name: Holly Ortiz MRN: [id number] Date of Birth: [birth year]  [facility]) CM/SW Contact:    Bascom Service, RN Phone Number: 07/24/2024, 3:41 PM  Clinical Narrative:  d/c plan home                 Expected Discharge Plan: Home/Self Care Barriers to Discharge: Continued Medical Work up   Patient Goals and CMS Choice Patient states their goals for this hospitalization and ongoing recovery are:: Home CMS Medicare.gov Compare Post Acute Care list provided to:: Patient Choice offered to / list presented to : Patient [place] ownership interest in [facility].provided to:: Patient    Expected Discharge Plan and Services                                              Prior Living Arrangements/Services                       Activities of Daily Living   ADL Screening (condition at time of admission) Independently performs ADLs?: Yes (appropriate for developmental age) Is the patient deaf or have difficulty hearing?: No Does the patient have difficulty seeing, even when wearing glasses/contacts?: No Does the patient have difficulty concentrating, remembering, or making decisions?: No  Permission Sought/Granted                  Emotional Assessment              Admission diagnosis:  DKA (diabetic ketoacidosis) (HCC) [E11.10] AKI (acute kidney injury) (HCC) [N17.9] Urinary tract infection without hematuria, site unspecified [N39.0] Diabetic ketoacidosis without coma associated with type 2 diabetes mellitus (HCC) [E11.10] Patient Active Problem List   Diagnosis Date Noted   Iron  deficiency anemia, unspecified 07/24/2024   UTI (urinary tract infection) 07/23/2024   Nausea and vomiting 07/23/2024   IT band syndrome 05/01/2024   Acute respiratory failure with hypoxia (HCC) 02/24/2024   Headache 05/03/2023   Other fatigue 01/25/2023   Rash and other  nonspecific skin eruption 01/25/2023   Urinary frequency 08/02/2022   Left flank pain 07/31/2022   Pain in left knee 05/03/2021   Arthralgia 02/24/2021   Undifferentiated connective tissue disease (HCC) 02/22/2021   Iron  deficiency 07/07/2020   Left low back pain 01/14/2020   Anemia 12/02/2019   Lightheadedness 12/02/2019   Menorrhagia 08/07/2019   Abnormal uterine bleeding 08/07/2019   Anxiety and depression 07/03/2018   DKA (diabetic ketoacidosis) (HCC) 03/28/2018   Tachycardia 03/28/2018   Urticaria 03/28/2018   Hyperosmolar hyperglycemia 01/04/2018   AKI (acute kidney injury) (HCC) 01/04/2018   Hyperglycemia 01/03/2018   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-I)    Asthma    Type 2 diabetes mellitus with complication, with long-term current use of insulin  (HCC)    Elevated cholesterol    Essential hypertension    PCP:  Berneta Elsie Sayre, MD Pharmacy:   [id number] [place], [place] - [address] [place] [address] Phone: [phone] Fax: [phone]  [facility] - [facility] [address] [place] [address] Phone: [phone] Fax: [phone]     Social Drivers of Health (SDOH) Social History: SDOH Screenings   Food Insecurity: No Food Insecurity (  07/23/2024)  Housing: Low Risk  (07/23/2024)  Transportation Needs: No Transportation Needs (07/23/2024)  Utilities: Not At Risk (07/23/2024)  Depression (PHQ2-9): Low Risk  (05/12/2024)  Financial Resource Strain: Low Risk  (10/25/2023)  Physical Activity: Unknown (10/25/2023)  Social Connections: Moderately Integrated (05/12/2024)  Stress: Patient Declined (10/25/2023)  Tobacco Use: Low Risk  (07/22/2024)   SDOH Interventions:     Readmission Risk Interventions     No data to display

## 2024-07-24 NOTE — Telephone Encounter (Signed)
 Patient referred to infusion pharmacy team for ambulatory infusion of IV iron .  Insurance - [employer] [place] Site of care - Site of care: [facility] Dx code - D50.9 IV Iron  Therapy - Venofer  300 mg x 2 Infusion appointments - Scheduling team will schedule patient as soon as possible.   Thank you,  Norton Blush, PharmD, BCSCP Pharmacist [facility]

## 2024-07-24 NOTE — Inpatient Diabetes Management (Signed)
 Inpatient Diabetes Program Recommendations  AACE/ADA: New Consensus Statement on Inpatient Glycemic Control (2015)  Target Ranges:  Prepandial:   less than 140 mg/dL      Peak postprandial:   less than 180 mg/dL (1-2 hours)      Critically ill patients:  140 - 180 mg/dL   Lab Results  Component Value Date   GLUCAP 308 (H) 07/24/2024   HGBA1C 10.2 (A) 05/20/2024    Review of Glycemic Control  Diabetes history: DM2 Outpatient Diabetes medications: Amaryl  6 mg daily QAM, U-500 40 units in am, 60 units at lunch and 60 units at dinner  Current orders for Inpatient glycemic control: Lantus  20 BID, Novolog  0-15 TID with meals  HgbA1C - 10.2% Needs U-500 insulin   Inpatient Diabetes Program Recommendations:    Consider:  D/C Lantus   Add U-500 40 units TID with meals  Increase Novolog  to 0-20 TID with meals and 0-5 HS  Will continue to follow glucose trends.  Thank you. Shona Brandy, RD, LDN, CDCES Inpatient Diabetes Coordinator [phone]

## 2024-07-24 NOTE — Progress Notes (Addendum)
 [facility] Progress Note  Patient: Holly Ortiz    FMW:[id number]  DOA: 07/22/2024     Date of Service: the patient was seen and examined on 07/24/2024  Chief Complaint  Patient presents with   Lupus Flare Up    Brief hospital course:  Holly Ortiz is a 58 y.o. 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  Denies any falls or trauma to her back.  She reports history of chronic urticaria and had hives, now resolved.  No other skin rash at this time and no oral ulcers.  Reports history of chronic cough for several months.  Denies shortness of breath or chest pain.  Reports having 1 episode of loose stool yesterday but no diarrhea.  She takes glimepiride  for her diabetes.  Also takes Humulin  40 units with breakfast, 60 units before dinner, and 60 units at bedtime.  Reports compliance with her home medication regimen.  She reports history of kidney stone status post lithotripsy 6 weeks ago.  She is endorsing urinary frequency and urgency.   ED Course: EMS noted CBG 520 and patient was given 4 mg Zofran  and 350 mL of normal saline prior to arrival.  Vital signs on arrival to the ED: Temperature 98.6 F, pulse 113, respiratory rate 18, blood pressure 104/60, SpO2 95% on room air.  Labs showing no leukocytosis, hemoglobin 10.2 (stable), sodium 132, potassium 3.6, chloride 92, glucose 530, bicarb 22, anion gap 18, BUN 21, creatinine 1.15 (baseline 0.6-0.9), calcium  8.4, albumin  2.8, normal LFTs, beta hydroxybutyric acid 2.59, VBG with pH 7.34.  UA with >500 glucose, 20 ketones, and signs of infection (trace leukocytes, 21-50 WBCs, and many bacteria).  CT abdomen  pelvis showing hepatic steatosis, aortoiliac atherosclerosis, and no acute findings.  Patient was given Zofran , ceftriaxone , 2 L IV fluid boluses, IV potassium, and started on insulin  drip per DKA protocol.    Assessment and Plan:  # DKA in the setting of poorly controlled insulin -dependent type 2 diabetes A1c 10.2 on 05/20/2024.  Presenting with glucose in the 500s with elevated anion gap, ketones on UA, and beta hydroxybutyric acid elevated.  Bicarb level and pH normal.  Suspect this is due to DKA with concurrent metabolic alkalosis due to vomiting or volume contraction.   S/p insulin  IV infusion as per Endo tool protocol.  Transition to subcu insulin . Continue Lantus  20 units twice daily, NovoLog  sliding scale, monitor CBG Continue diabetic diet   # Hypokalemia, potassium repleted. # Hypophosphatemia, Phos repleted. # Hypomagnesemia, mag repleted. Check electrolytes daily and replete as needed.   # Vitamin B12 level 218, goal 400. Started vitamin B12 1000 mcg IM injection daily during hospital stay, followed by oral supplement.  Follow-up PCP to repeat vitamin B12 level after 3 to 6 months.   # Vitamin D  insufficiency: started vitamin D  50,000 units p.o. weekly, follow with PCP to repeat vitamin D  level after 3 to 6 months.  # Iron  deficiency, Tsat 7% 8/21 Venofer  300 mg IV x 1 dose given Start oral supplement on discharge.  Follow-up with PCP and repeat iron  profile after 3 to 6 months.   # Pyuria, UTI ruled out  Patient is endorsing urinary frequency and urgency.  UA with trace leukocytes, 21-50 WBCs, and many bacteria.  No fever or leukocytosis.  S/p ceftriaxone , DC'd on 8/21 Urine culture insufficient growth   Mild AKI Likely due to dehydration.  Continue IV fluids and monitor renal function.  Avoid nephrotoxic agents.   Undifferentiated connective tissue disease Patient is seen by rheumatologist Dr. Jeannetta.  Continue Plaquenil .   Hypertension Continue amlodipine .    Hyperlipidemia Continue Lipitor.   Anxiety/depression Continue Lexapro .   Asthma Stable, no signs of acute exacerbation.  Continue home meds.   Chronic anemia Hemoglobin stable.   Body mass index is 38.97 kg/m.  Interventions:  Diet: DM diet DVT Prophylaxis: Subcutaneous Lovenox    Advance goals of care discussion: Full code  Family Communication: family was present at bedside, at the time of interview.  The pt provided permission to discuss medical plan with the family. Opportunity was given to ask question and all questions were answered satisfactorily.   Disposition:  Pt is from Home, admitted with DKA, still has hign BG and electrolyte imbalance, which precludes a safe discharge. Discharge to home, when stable, most likely tomorrow a.m.  Subjective: Patient was seen and examined at bedside today.  Denied any complaints, no nausea vomiting, no abdominal pain.  Tolerated diet well. Patient is aware that due to electrolyte imbalance we will keep her today and plan for discharge tomorrow a.m.   Physical Exam: General: NAD, lying comfortably Appear in no distress, affect appropriate Eyes: PERRLA ENT: Oral Mucosa Clear, moist  Neck: no JVD,  Cardiovascular: S1 and S2 Present, no Murmur,  Respiratory: good respiratory effort, Bilateral Air entry equal and Decreased, no Crackles, no wheezes Abdomen: Bowel Sound present, Soft and no tenderness,  Skin: no rashes Extremities: no Pedal edema, no calf tenderness Neurologic: without any new focal findings Gait not checked due to patient safety concerns  Vitals:   07/23/24 1658 07/23/24 2020 07/24/24 0618 07/24/24 1453  BP: (!) 159/88 (!) 143/83 (!) 131/52 122/77  Pulse: (!) 110 (!) 103 96 93  Resp: 20 16 17    Temp: 99.1 F (37.3 C) 99 F (37.2 C) 98.5 F (36.9 C) 99.1 F (37.3 C)  TempSrc: Oral Oral Oral Oral  SpO2: 95% 93% 95% 93%  Weight:        Intake/Output Summary (Last 24 hours) at 07/24/2024 1541 Last data  filed at 07/24/2024 0600 Gross per 24 hour  Intake 1450.92 ml  Output --  Net 1450.92 ml   Filed Weights   07/23/24 0200  Weight: 99.8 kg    Data Reviewed: I have personally reviewed and interpreted daily labs, tele strips, imagings as discussed above. I reviewed all nursing notes, pharmacy notes, vitals, pertinent old records I have discussed plan of care as described above with RN and patient/family.  CBC: Recent Labs  Lab 07/22/24 2332 07/24/24 0518  WBC 8.4 7.6  NEUTROABS 7.3  --   HGB 10.2* 8.5*  HCT 31.9* 28.3*  MCV 81.4 84.5  PLT 236 223   Basic Metabolic Panel: Recent Labs  Lab 07/23/24 0503 07/23/24 0754 07/23/24 1230 07/23/24 1714 07/24/24 0518  NA 133* 136 135 134* 135  K 3.0* 3.4* 3.7 4.0 3.7  CL 98 101 101 103 101  CO2 26 28 26 23 25   GLUCOSE 294* 172* 197* 318* 310*  BUN 17 16 14 16 13   CREATININE 0.71 0.75 0.63 0.60 0.60  CALCIUM  8.0* 7.9* 8.1* 8.2* 8.1*  MG  --  1.4*  --  2.8* 1.7  PHOS  --  1.6*  --  2.3* 2.3*    Studies: No results found.  Scheduled Meds:  amLODipine   10 mg Oral Daily   atorvastatin   40 mg Oral Daily   cyanocobalamin   1,000 mcg Intramuscular Q1200   Followed by   NOREEN ON 07/30/2024] vitamin B-12  1,000 mcg Oral Daily   enoxaparin  (LOVENOX ) injection  40 mg Subcutaneous Q24H   escitalopram   20 mg Oral Daily   hydroxychloroquine   400 mg Oral Daily   insulin  aspart  0-15 Units Subcutaneous TID WC   insulin  glargine  20 Units Subcutaneous BID   montelukast   10 mg Oral QHS   phosphorus  500 mg Oral TID   Vitamin D  (Ergocalciferol )  50,000 Units Oral Q7 days   Continuous Infusions:  cefTRIAXone  (ROCEPHIN )  IV 1 g (07/24/24 0051)   insulin  Stopped (07/23/24 1033)   PRN Meds: acetaminophen  **OR** acetaminophen , albuterol , dextrose , ondansetron  (ZOFRAN ) IV  Time spent: 55 minutes  Author: ELVAN SOR. MD [facility] 07/24/2024 3:41 PM  To reach On-call, see care teams to locate the attending and reach out to  them via [email or web address]. If 7PM-7AM, please contact night-coverage If you still have difficulty reaching the attending provider, please page the [facility] (Director on Call) for [facility] on amion for assistance.

## 2024-07-25 DIAGNOSIS — E1110 Type 2 diabetes mellitus with ketoacidosis without coma: Secondary | ICD-10-CM | POA: Diagnosis not present

## 2024-07-25 LAB — CBC
HCT: 28.6 % — ABNORMAL LOW (ref 36.0–46.0)
Hemoglobin: 8.7 g/dL — ABNORMAL LOW (ref 12.0–15.0)
MCH: 25.8 pg — ABNORMAL LOW (ref 26.0–34.0)
MCHC: 30.4 g/dL (ref 30.0–36.0)
MCV: 84.9 fL (ref 80.0–100.0)
Platelets: 232 K/uL (ref 150–400)
RBC: 3.37 MIL/uL — ABNORMAL LOW (ref 3.87–5.11)
RDW: 13.7 % (ref 11.5–15.5)
WBC: 6.8 K/uL (ref 4.0–10.5)
nRBC: 0 % (ref 0.0–0.2)

## 2024-07-25 LAB — BASIC METABOLIC PANEL WITH GFR
Anion gap: 9 (ref 5–15)
BUN: 11 mg/dL (ref 6–20)
CO2: 29 mmol/L (ref 22–32)
Calcium: 8.4 mg/dL — ABNORMAL LOW (ref 8.9–10.3)
Chloride: 103 mmol/L (ref 98–111)
Creatinine, Ser: 0.59 mg/dL (ref 0.44–1.00)
GFR, Estimated: >60 mL/min (ref >60)
Glucose, Bld: 191 mg/dL — ABNORMAL HIGH (ref 70–99)
Potassium: 3.5 mmol/L (ref 3.5–5.1)
Sodium: 141 mmol/L (ref 135–145)

## 2024-07-25 LAB — MAGNESIUM: Magnesium: 1.6 mg/dL — ABNORMAL LOW (ref 1.7–2.4)

## 2024-07-25 LAB — GLUCOSE, CAPILLARY
Glucose-Capillary: 183 mg/dL — ABNORMAL HIGH (ref 70–99)
Glucose-Capillary: 249 mg/dL — ABNORMAL HIGH (ref 70–99)

## 2024-07-25 LAB — PHOSPHORUS: Phosphorus: 3.5 mg/dL (ref 2.5–4.6)

## 2024-07-25 MED ORDER — POTASSIUM CHLORIDE CRYS ER 20 MEQ PO TBCR
40.0000 meq | EXTENDED_RELEASE_TABLET | Freq: Once | ORAL | Status: AC
  Administered 2024-07-25: 40 meq via ORAL
  Filled 2024-07-25: qty 2

## 2024-07-25 MED ORDER — VITAMIN B-12 1000 MCG PO TABS: 1000.0000 ug | ORAL_TABLET | Freq: Every day | ORAL | 0 refills | Status: AC

## 2024-07-25 MED ORDER — MAGNESIUM SULFATE 2 GM/50ML IV SOLN
2.0000 g | Freq: Once | INTRAVENOUS | Status: AC
  Administered 2024-07-25: 2 g via INTRAVENOUS
  Filled 2024-07-25: qty 50

## 2024-07-25 MED ORDER — HYDRALAZINE HCL 20 MG/ML IJ SOLN: 10.0000 mg | Freq: Four times a day (QID) | INTRAMUSCULAR | Status: DC | PRN

## 2024-07-25 MED ORDER — HYDRALAZINE HCL 50 MG PO TABS: 50.0000 mg | ORAL_TABLET | Freq: Four times a day (QID) | ORAL | Status: DC | PRN

## 2024-07-25 MED ORDER — VITAMIN D (ERGOCALCIFEROL) 1.25 MG (50000 UNIT) PO CAPS: 50000.0000 [IU] | ORAL_CAPSULE | ORAL | 0 refills | Status: AC

## 2024-07-25 MED ORDER — VITAMIN C 500 MG PO TABS: 500.0000 mg | ORAL_TABLET | Freq: Every day | ORAL | 2 refills | Status: AC

## 2024-07-25 MED ORDER — POLYSACCHARIDE IRON COMPLEX 150 MG PO CAPS: 150.0000 mg | ORAL_CAPSULE | Freq: Every day | ORAL | 2 refills | Status: AC

## 2024-07-25 NOTE — TOC Transition Note (Signed)
 Transition of Care [facility]) - Discharge Note   Patient Details  Name: Holly Ortiz MRN: [id number] Date of Birth: [birth year]  [facility] CM/SW Contact:  Bascom Service, RN Phone Number: 07/25/2024, 11:18 AM   Clinical Narrative: d/c home no CM needs.      Final next level of care: Home/Self Care Barriers to Discharge: No Barriers Identified   Patient Goals and CMS Choice Patient states their goals for this hospitalization and ongoing recovery are:: Home CMS Medicare.gov Compare Post Acute Care list provided to:: Patient Choice offered to / list presented to : Patient [place] ownership interest in [facility].provided to:: Patient    Discharge Placement                       Discharge Plan and Services Additional resources added to the After Visit Summary for                                       Social Drivers of Health (SDOH) Interventions SDOH Screenings   Food Insecurity: No Food Insecurity (07/23/2024)  Housing: Low Risk  (07/23/2024)  Transportation Needs: No Transportation Needs (07/23/2024)  Utilities: Not At Risk (07/23/2024)  Depression (PHQ2-9): Low Risk  (05/12/2024)  Financial Resource Strain: Low Risk  (10/25/2023)  Physical Activity: Unknown (10/25/2023)  Social Connections: Moderately Integrated (05/12/2024)  Stress: Patient Declined (10/25/2023)  Tobacco Use: Low Risk  (07/22/2024)     Readmission Risk Interventions     No data to display

## 2024-07-25 NOTE — Discharge Summary (Signed)
 [facility] Discharge Summary   Patient: Holly Ortiz FMW:[id number]  PCP: Berneta Elsie Sayre, MD  Date of admission: 07/22/2024   Date of discharge:  07/25/2024     Discharge Diagnoses:  Principal Problem:   DKA (diabetic ketoacidosis) (HCC) Active Problems:   Essential hypertension   AKI (acute kidney injury) (HCC)   UTI (urinary tract infection)   Nausea and vomiting   Admitted From: Home Disposition:  Home   Recommendations for Outpatient Follow-up:  Follow-up with PCP in 1 week, continue monitor BP at home and follow with repeated regular medication accordingly. Monitor CBG at home, continue diabetic diet and use insulin  accordingly.  Follow-up with endocrinologist as an outpatient for further management. IV iron  infusion referral placed. Follow up LABS/TEST:  Repeat vitamin B12 level, vitamin D  level and iron  profile after 3 to 6 months.    Follow-up Information     Berneta Elsie Sayre, MD Follow up in 1 week(s).   Specialty: Family Medicine Contact information: [address] [place] [address] [phone]                Diet recommendation: Cardiac and Carb modified diet  Activity: The patient is advised to gradually reintroduce usual activities, as tolerated  Discharge Condition: stable  Code Status: Full code   History of present illness: As per the H and P dictated on admission. Hospital Course:  Holly Ortiz is a 58 y.o. 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  Denies any falls or trauma to her back.  She reports history of chronic urticaria and had hives, now resolved.  No other  skin rash at this time and no oral ulcers.  Reports history of chronic cough for several months.  Denies shortness of breath or chest pain.  Reports having 1 episode of loose stool yesterday but no diarrhea.  She takes glimepiride  for her diabetes.  Also takes Humulin  40 units with breakfast, 60 units before dinner, and 60 units at bedtime.  Reports compliance with her home medication regimen.  She reports history of kidney stone status post lithotripsy 6 weeks ago.  She is endorsing urinary frequency and urgency.   ED Course: EMS noted CBG 520 and patient was given 4 mg Zofran  and 350 mL of normal saline prior to arrival.  Vital signs on arrival to the ED: Temperature 98.6 F, pulse 113, respiratory rate 18, blood pressure 104/60, SpO2 95% on room air.  Labs showing no leukocytosis, hemoglobin 10.2 (stable), sodium 132, potassium 3.6, chloride 92, glucose 530, bicarb 22, anion gap 18, BUN 21, creatinine 1.15 (baseline 0.6-0.9), calcium  8.4, albumin  2.8, normal LFTs, beta hydroxybutyric acid 2.59, VBG with pH 7.34.  UA with >500 glucose, 20 ketones, and signs of infection (trace leukocytes, 21-50 WBCs, and many bacteria).  CT abdomen pelvis showing hepatic steatosis, aortoiliac atherosclerosis, and no acute findings.  Patient was given Zofran , ceftriaxone , 2 L IV fluid boluses, IV potassium, and started on insulin  drip per DKA protocol.     Assessment and Plan:   # DKA in the setting of poorly controlled insulin -dependent type 2 diabetes: A1c 10.2 on 05/20/2024.  Presenting with glucose in the 500s with elevated anion gap, ketones on UA, and beta hydroxybutyric acid elevated.  Bicarb level and pH normal.  Suspect this is due to DKA with concurrent metabolic alkalosis due to vomiting or volume contraction.   S/p insulin  IV infusion as per Endo tool protocol.  Transition to subcu insulin . S/p Lantus  20 units twice daily, NovoLog  sliding scale given during hospital stay.  As per patient she was sick so she did  not use insulin  for 2 days at home. CBG is well-controlled now, patient was advised to continue same dose of insulin  Humulin  R U-500 home dose 40U + 60 U + 60 U, and continue to glimepiride  60 mg p.o. daily home dose. Continue diabetic diet and follow with PCP and endocrinologist for further management.  # Hypokalemia, potassium repleted.  Resolved # Hypophosphatemia, Phos repleted.  Resolved # Hypomagnesemia, mag repleted. # Vitamin B12 level 218, goal 400. Started vitamin B12 1000 mcg IM injection daily during hospital stay, followed by oral supplement.  Follow-up PCP to repeat vitamin B12 level after 3 to 6 months. # Vitamin D  insufficiency: started vitamin D  50,000 units p.o. weekly, follow with PCP to repeat vitamin D  level after 3 to 6 months. # Iron  deficiency, Tsat 7% 8/21 Venofer  300 mg IV x 1 dose given Start oral supplement on discharge.  Follow-up with PCP and repeat iron  profile after 3 to 6 months. Referral placed for IV iron  infusion as an outpatient  # Pyuria, UTI ruled out. Patient is endorsing urinary frequency and urgency.  UA with trace leukocytes, 21-50 WBCs, and many bacteria.  No fever or leukocytosis.   S/p ceftriaxone , DC'd on 8/21 Urine culture insufficient growth   # Mild AKI: Likely due to dehydration.  Resolved, s/p IV fluids # Undifferentiated connective tissue disease:  Patient is seen by rheumatologist Dr. Jeannetta.  Continue Plaquenil . # Hypertension: Continue amlodipine . # Hyperlipidemia: Continue Lipitor. # Anxiety/depression: Continue Lexapro . # Asthma:  Stable, no signs of acute exacerbation.  Continue home meds. # Chronic anemia: Hb 8.7 stable.   Body mass index is 40.38 kg/m.  Nutrition Interventions:  Patient was ambulatory without any assistance. On the day of the discharge the patient's vitals were stable, and no other acute medical condition were reported by patient. the patient was felt safe to be discharge at Home.  Consultants:  None Procedures: None  Discharge Exam: General: Appear in no distress, no Rash; Oral Mucosa Clear, moist. Cardiovascular: S1 and S2 Present, no Murmur, Respiratory: normal respiratory effort, Bilateral Air entry present and no Crackles, no wheezes Abdomen: Bowel Sound present, Soft and no tenderness, no hernia Extremities: no Pedal edema, no calf tenderness Neurology: alert and oriented to time, place, and person affect appropriate.  Filed Weights   07/23/24 0200 07/24/24 1942  Weight: 99.8 kg 103.4 kg   Vitals:   07/24/24 2005 07/25/24 0556  BP: (!) 158/61 (!) 164/54  Pulse: (!) 102 94  Resp: 16 16  Temp: 100.2 F (37.9 C) 98.7 F (37.1 C)  SpO2: 96% 95%    DISCHARGE MEDICATION: Allergies as of 07/25/2024       Reactions   Ozempic  (0.25 Or 0.5 Mg-dose) [semaglutide (0.25 Or 0.5mg -dos)] Nausea And Vomiting   Lisinopril  Swelling, Other (See Comments)   Angioedema, including likely GI involvement   Metformin  And Related Diarrhea, Nausea And Vomiting   Pioglitazone  Hydrochloride Other (See Comments)   Reaction not stated        Medication List     PAUSE taking these medications    Vitamin D3 50 MCG (2000 UT) Tabs Wait to take this until: September 29, 2024 Take 2,000 Units  by mouth daily.       STOP taking these medications    diclofenac  75 MG EC tablet Commonly known as: VOLTAREN    fenofibrate  145 MG tablet Commonly known as: Tricor    tamsulosin  0.4 MG Caps capsule Commonly known as: FLOMAX    triamcinolone  ointment 0.5 % Commonly known as: KENALOG    triamterene -hydrochlorothiazide  37.5-25 MG tablet Commonly known as: MAXZIDE -25       TAKE these medications    Accu-Chek Softclix Lancets lancets 1 each 3 (three) times daily.   acetaminophen  500 MG tablet Commonly known as: TYLENOL  Take 2 tablets (1,000 mg total) by mouth every 6 (six) hours as needed.   albuterol  108 (90 Base) MCG/ACT inhaler Commonly known as: VENTOLIN  HFA Inhale 2 puffs  into the lungs every 6 (six) hours as needed for wheezing or shortness of breath.   amLODipine  10 MG tablet Commonly known as: NORVASC  TAKE 1 TABLET BY MOUTH EVERY DAY   ascorbic acid 500 MG tablet Commonly known as: VITAMIN C  Take 1 tablet (500 mg total) by mouth daily.   atorvastatin  40 MG tablet Commonly known as: LIPITOR Take 1 tablet (40 mg total) by mouth daily.   BD Pen Needle Nano 2nd Gen 32G X 4 MM Misc Generic drug: Insulin  Pen Needle Use four times daily to inject insulin .   Blood Glucose Monitoring Suppl Devi 1 each by Does not apply route in the morning, at noon, and at bedtime. May substitute to any manufacturer covered by patient's insurance.   cyanocobalamin  1000 MCG tablet Commonly known as: VITAMIN B12 Take 1 tablet (1,000 mcg total) by mouth daily.   cyclobenzaprine  5 MG tablet Commonly known as: FLEXERIL  TAKE 1 TABLET BY MOUTH AT BEDTIME AS NEEDED FOR MUSCLE SPASMS.   escitalopram  20 MG tablet Commonly known as: LEXAPRO  TAKE 1 TABLET BY MOUTH EVERY DAY   glimepiride  4 MG tablet Commonly known as: AMARYL  Take 1.5 tablets (6 mg total) by mouth daily before breakfast.   HumuLIN  R U-500 KwikPen 500 UNIT/ML KwikPen Generic drug: insulin  regular human CONCENTRATED INJECT 40 UNITS UNDER THE SKIN BEFORE BREAKFAST, 20-25 UNITS BEFORE LUNCH AND 40 - 60 UNITS AT SUPPER. Maximum 125 units/day.   hydroxychloroquine  200 MG tablet Commonly known as: PLAQUENIL  Take 2 tablets (400 mg total) by mouth daily.   iron  polysaccharides 150 MG capsule Commonly known as: NIFEREX Take 1 capsule (150 mg total) by mouth daily.   melatonin 5 MG Tabs Take 5 mg by mouth at bedtime as needed (FOR SLEEP).   montelukast  10 MG tablet Commonly known as: SINGULAIR  TAKE 1 TABLET BY MOUTH EVERYDAY AT BEDTIME   phenazopyridine  200 MG tablet Commonly known as: PYRIDIUM  Take 1 tablet (200 mg total) by mouth 3 (three) times daily.   polyethylene glycol 17 g packet Commonly known  as: MIRALAX  / GLYCOLAX  Take 17 g by mouth daily as needed for mild constipation.   Vitamin D  (Ergocalciferol ) 1.25 MG (50000 UNIT) Caps capsule Commonly known as: DRISDOL  Take 1 capsule (50,000 Units total) by mouth every 7 (seven) days. Start taking on: July 31, 2024   zinc gluconate 50 MG tablet Take 50 mg by mouth daily.       Allergies  Allergen Reactions   Ozempic  (0.25 Or 0.5 Mg-Dose) [Semaglutide (0.25 Or 0.5mg -Dos)] Nausea And Vomiting   Lisinopril  Swelling and Other (See Comments)    Angioedema, including likely GI involvement   Metformin  And Related Diarrhea and Nausea And Vomiting   Pioglitazone  Hydrochloride Other (See Comments)  Reaction not stated   Discharge Instructions     Amb Referral to Intravenous Iron  Therapy   Complete by: As directed    You have been referred to [facility] Infusion team for IV Iron  Infusions. The infusion pharmacy team will reach out to you with appointment information.    Primary Diagnosis Code for IV Iron : D63.8 - Anemia in other chronic diseases classified elsewhere   Secondary diagnosis code for IV iron : Other   Comment: Lupus + Iron  deficiency   Call MD for:   Complete by: As directed    Uncontrolled blood pressure and uncontrolled blood glucose levels   Call MD for:  difficulty breathing, headache or visual disturbances   Complete by: As directed    Call MD for:  extreme fatigue   Complete by: As directed    Call MD for:  persistant dizziness or light-headedness   Complete by: As directed    Call MD for:  persistant nausea and vomiting   Complete by: As directed    Call MD for:  severe uncontrolled pain   Complete by: As directed    Call MD for:  temperature >100.4   Complete by: As directed    Diet - low sodium heart healthy   Complete by: As directed    Diet Carb Modified   Complete by: As directed    Discharge instructions   Complete by: As directed    Follow-up with PCP in 1 week, continue monitor BP at home and  follow with repeated regular medication accordingly. Monitor CBG at home, continue diabetic diet and use insulin  accordingly.  Follow-up with endocrinologist as an outpatient for further management. Repeat vitamin B12 level, vitamin D  level and iron  profile after 3 to 6 months. IV iron  infusion referral placed.   Increase activity slowly   Complete by: As directed        The results of significant diagnostics from this hospitalization (including imaging, microbiology, ancillary and laboratory) are listed below for reference.    Significant Diagnostic Studies: CT ABDOMEN PELVIS W CONTRAST Result Date: 07/23/2024 CLINICAL DATA:  Abdominal pain, vomiting EXAM: CT ABDOMEN AND PELVIS WITH CONTRAST TECHNIQUE: Multidetector CT imaging of the abdomen and pelvis was performed using the standard protocol following bolus administration of intravenous contrast. RADIATION DOSE REDUCTION: This exam was performed according to the departmental dose-optimization program which includes automated exposure control, adjustment of the mA and/or kV according to patient size and/or use of iterative reconstruction technique. CONTRAST:  OMNIPAQUE  IOHEXOL  300 MG/ML  SOLN COMPARISON:  02/24/2024 FINDINGS: Lower chest: No acute findings Hepatobiliary: Diffuse low-density throughout the liver compatible with fatty infiltration. No focal abnormality. Gallbladder unremarkable. Pancreas: No focal abnormality or ductal dilatation. Spleen: No focal abnormality.  Normal size. Adrenals/Urinary Tract: Adrenal glands normal. Left lower pole stone again seen, unchanged since prior study. No ureteral stones or hydronephrosis. No suspicious renal abnormality. Urinary bladder decompressed, unremarkable. Stomach/Bowel: Normal appendix. Stomach, large and small bowel grossly unremarkable. Vascular/Lymphatic: Of aortoiliac atherosclerosis. No evidence of aneurysm or adenopathy. Reproductive: Prior hysterectomy.  No adnexal masses. Other: No  free fluid or free air. Musculoskeletal: No acute bony abnormality. IMPRESSION: No acute findings in the abdomen or pelvis. Hepatic steatosis. Aortoiliac atherosclerosis. Electronically Signed   By: Franky Crease M.D.   On: 07/23/2024 01:06    Microbiology: Recent Results (from the past 240 hours)  Urine Culture (for pregnant, neutropenic or urologic patients or patients with an indwelling urinary catheter)     Status: Abnormal  Collection Time: 07/23/24 12:46 AM   Specimen: Urine, Clean Catch  Result Value Ref Range Status   Specimen Description   Final    URINE, CLEAN CATCH Performed at [facility], [address], [place], [place] [address]    Special Requests   Final    NONE Performed at [facility], [address], [place], [place] [address]    Culture (A)  Final    <10,000 COLONIES/mL INSIGNIFICANT GROWTH Performed at [facility], [address], [place], [place] [address]    Report Status 07/24/2024 FINAL  Final     Labs: CBC: Recent Labs  Lab 07/22/24 2332 07/24/24 0518 07/25/24 0505  WBC 8.4 7.6 6.8  NEUTROABS 7.3  --   --   HGB 10.2* 8.5* 8.7*  HCT 31.9* 28.3* 28.6*  MCV 81.4 84.5 84.9  PLT 236 223 232   Basic Metabolic Panel: Recent Labs  Lab 07/23/24 0754 07/23/24 1230 07/23/24 1714 07/24/24 0518 07/25/24 0505  NA 136 135 134* 135 141  K 3.4* 3.7 4.0 3.7 3.5  CL 101 101 103 101 103  CO2 28 26 23 25 29   GLUCOSE 172* 197* 318* 310* 191*  BUN 16 14 16 13 11   CREATININE 0.75 0.63 0.60 0.60 0.59  CALCIUM  7.9* 8.1* 8.2* 8.1* 8.4*  MG 1.4*  --  2.8* 1.7 1.6*  PHOS 1.6*  --  2.3* 2.3* 3.5   Liver Function Tests: Recent Labs  Lab 07/22/24 2332  AST 22  ALT 24  ALKPHOS 67  BILITOT 1.1  PROT 6.4*  ALBUMIN  2.8*   No results for input(s): LIPASE, AMYLASE in the last 168 hours. No results for input(s): AMMONIA in the last 168 hours. Cardiac Enzymes: No results for input(s): CKTOTAL, CKMB,  CKMBINDEX, TROPONINI in the last 168 hours. BNP (last 3 results) No results for input(s): BNP in the last 8760 hours. CBG: Recent Labs  Lab 07/24/24 0729 07/24/24 1105 07/24/24 1609 07/24/24 2109 07/25/24 0747  GLUCAP 308* 322* 231* 254* 183*    Time spent: 35 minutes  Signed:  Elvan Sor  [facility] 07/25/2024 11:15 AM

## 2024-07-25 NOTE — Plan of Care (Signed)

## 2024-07-25 NOTE — Telephone Encounter (Signed)
 Patient contacted the office regarding her FMLA paperwork. Patient states she dropped the paperwork on 07/15/2024. Patient states she is following up on it. Patient states she is requesting intermediate FMLA for her job. Patient states she is currently in the hospital. I did remind patient it does take 10-14 business days to complete.

## 2024-07-28 ENCOUNTER — Telehealth: Payer: Self-pay

## 2024-07-28 NOTE — Transitions of Care (Post Inpatient/ED Visit) (Unsigned)
   07/28/2024  Name: Holly Ortiz MRN: [id number] DOB: 1966-07-28  Today's TOC FU Call Status: Today's TOC FU Call Status:: Unsuccessful Call (1st Attempt) Unsuccessful Call (1st Attempt) Date: 07/28/24  Attempted to reach the patient regarding the most recent Inpatient/ED visit.  Follow Up Plan: Additional outreach attempts will be made to reach the patient to complete the Transitions of Care (Post Inpatient/ED visit) call.   Signature Julian Lemmings, LPN [facility] Nurse Health Advisor Direct Dial  [phone]

## 2024-07-29 NOTE — Transitions of Care (Post Inpatient/ED Visit) (Unsigned)
   07/29/2024  Name: Holly Ortiz MRN: [id number] DOB: 05-12-1966  Today's TOC FU Call Status: Today's TOC FU Call Status:: Unsuccessful Call (2nd Attempt) Unsuccessful Call (1st Attempt) Date: 07/28/24 Unsuccessful Call (2nd Attempt) Date: 07/29/24  Attempted to reach the patient regarding the most recent Inpatient/ED visit.  Follow Up Plan: Additional outreach attempts will be made to reach the patient to complete the Transitions of Care (Post Inpatient/ED visit) call.   Signature Julian Lemmings, LPN [facility] Nurse Health Advisor Direct Dial  [phone]

## 2024-07-30 NOTE — Transitions of Care (Post Inpatient/ED Visit) (Signed)
 07/30/2024  Name: Holly Ortiz MRN: [id number] DOB: 09-25-1966  Today's TOC FU Call Status: Today's TOC FU Call Status:: Successful TOC FU Call Completed Unsuccessful Call (1st Attempt) Date: 07/28/24 Unsuccessful Call (2nd Attempt) Date: 07/29/24 [facility] FU Call Complete Date: 07/30/24 Patient's Name and Date of Birth confirmed.  Transition Care Management Follow-up Telephone Call Date of Discharge: 07/25/24 Discharge Facility: [facility]) Type of Discharge: Inpatient Admission Primary Inpatient Discharge Diagnosis:: anemia How have you been since you were released from the hospital?: Better Any questions or concerns?: No  Items Reviewed: Did you receive and understand the discharge instructions provided?: Yes Medications obtained,verified, and reconciled?: Yes (Medications Reviewed) Any new allergies since your discharge?: No Dietary orders reviewed?: Yes Do you have support at home?: Yes People in Home [RPT]: spouse  Medications Reviewed Today: Medications Reviewed Today     Reviewed by Emmitt Pan, LPN (Licensed Practical Nurse) on 07/30/24 at 1202  Med List Status: <None>   Medication Order Taking? Sig Documenting Provider Last Dose Status Informant  0.9 %  sodium chloride  infusion [id number]   Cunningham, Scott E, MD  Active   Accu-Chek Softclix Lancets lancets [id number] Yes 1 each 3 (three) times daily. [provider]  Active Self  acetaminophen  (TYLENOL ) 500 MG tablet [id number] Yes Take 2 tablets (1,000 mg total) by mouth every 6 (six) hours as needed. Tomblin, James, MD  Active Self  albuterol  (VENTOLIN  HFA) [phone] Base) MCG/ACT inhaler [id number] Yes Inhale 2 puffs into the lungs every 6 (six) hours as needed for wheezing or shortness of breath. Caleen Burgess BROCKS, MD  Active Self  amLODipine  (NORVASC ) 10 MG tablet [id number] Yes TAKE 1 TABLET BY MOUTH EVERY DAY Berneta Elsie Sayre, MD  Active Self  atorvastatin  (LIPITOR) 40 MG tablet [id number] Yes  Take 1 tablet (40 mg total) by mouth daily. Berneta Elsie Sayre, MD  Active Self  Blood Glucose Monitoring Suppl DEVI [id number] Yes 1 each by Does not apply route in the morning, at noon, and at bedtime. May substitute to any manufacturer covered by patient's insurance. Thapa, [place], MD  Active Self  Cholecalciferol (VITAMIN D3) 50 MCG (2000 UT) TABS [id number]  Take 2,000 Units by mouth daily.  Patient not taking: Reported on 07/30/2024   [provider]  Active Self  cyanocobalamin  (VITAMIN B12) 1000 MCG tablet [id number] Yes Take 1 tablet (1,000 mcg total) by mouth daily. Von Bellis, MD  Active   cyclobenzaprine  (FLEXERIL ) 5 MG tablet [id number] Yes TAKE 1 TABLET BY MOUTH AT BEDTIME AS NEEDED FOR MUSCLE SPASMS. Jeannetta Lonni ORN, MD  Active Self  escitalopram  (LEXAPRO ) 20 MG tablet [id number] Yes TAKE 1 TABLET BY MOUTH EVERY DAY Berneta Elsie Sayre, MD  Active Self  glimepiride  (AMARYL ) 4 MG tablet [id number] Yes Take 1.5 tablets (6 mg total) by mouth daily before breakfast. Thapa, [place], MD  Active Self  hydroxychloroquine  (PLAQUENIL ) 200 MG tablet [id number] Yes Take 2 tablets (400 mg total) by mouth daily. Jeannetta Lonni ORN, MD  Active Self  Insulin  Pen Needle (BD PEN NEEDLE NANO 2ND GEN) 32G X 4 MM MISC [id number] Yes Use four times daily to inject insulin . Von [place], MD  Active Self  insulin  regular human CONCENTRATED (HUMULIN  R U-500 KWIKPEN) 500 UNIT/ML KwikPen [id number] Yes INJECT 40 UNITS UNDER THE SKIN BEFORE BREAKFAST, 20-25 UNITS BEFORE LUNCH AND 40 - 60 UNITS AT SUPPER. Maximum 125 units/day. Thapa, [place], MD  Active Self  iron  polysaccharides (NIFEREX) 150 MG capsule [id number] Yes Take 1 capsule (  150 mg total) by mouth daily. Von Bellis, MD  Active   Melatonin 5 MG TABS [id number] Yes Take 5 mg by mouth at bedtime as needed (FOR SLEEP). [provider]  Active Self  montelukast  (SINGULAIR ) 10 MG tablet [id number] Yes TAKE 1 TABLET BY MOUTH EVERYDAY AT BEDTIME  Berneta Elsie Sayre, MD  Active Self  phenazopyridine  (PYRIDIUM ) 200 MG tablet [id number] Yes Take 1 tablet (200 mg total) by mouth 3 (three) times daily. Reddick, Johnathan B, NP  Active Self  polyethylene glycol (MIRALAX  / GLYCOLAX ) 17 g packet [id number] Yes Take 17 g by mouth daily as needed for mild constipation. [provider]  Active Self  vitamin C  (VITAMIN C ) 500 MG tablet [id number] Yes Take 1 tablet (500 mg total) by mouth daily. Von Bellis, MD  Active   Vitamin D , Ergocalciferol , (DRISDOL ) 1.25 MG (50000 UNIT) CAPS capsule [id number] Yes Take 1 capsule (50,000 Units total) by mouth every 7 (seven) days. Von Bellis, MD  Active   zinc gluconate 50 MG tablet [id number] Yes Take 50 mg by mouth daily.  [provider]  Active Self            Home Care and Equipment/Supplies: Were Home Health Services Ordered?: NA Any new equipment or medical supplies ordered?: NA  Functional Questionnaire: Do you need assistance with bathing/showering or dressing?: No Do you need assistance with meal preparation?: No Do you need assistance with eating?: No Do you have difficulty maintaining continence: No Do you need assistance with getting out of bed/getting out of a chair/moving?: No Do you have difficulty managing or taking your medications?: No  Follow up appointments reviewed: PCP Follow-up appointment confirmed?: NA Specialist Hospital Follow-up appointment confirmed?: Yes Date of Specialist follow-up appointment?: 08/01/24 Follow-Up Specialty Provider:: Lupus Do you need transportation to your follow-up appointment?: No Do you understand care options if your condition(s) worsen?: Yes-patient verbalized understanding    SIGNATURE Julian Lemmings, LPN [facility] Nurse Health Advisor Direct Dial  [phone]

## 2024-08-01 ENCOUNTER — Ambulatory Visit: Attending: Internal Medicine | Admitting: Internal Medicine

## 2024-08-01 ENCOUNTER — Encounter: Payer: Self-pay | Admitting: Internal Medicine

## 2024-08-01 VITALS — BP 118/65 | HR 93 | Resp 16 | Ht 63.0 in | Wt 223.0 lb

## 2024-08-01 DIAGNOSIS — M359 Systemic involvement of connective tissue, unspecified: Secondary | ICD-10-CM

## 2024-08-01 DIAGNOSIS — L509 Urticaria, unspecified: Secondary | ICD-10-CM

## 2024-08-01 DIAGNOSIS — M7630 Iliotibial band syndrome, unspecified leg: Secondary | ICD-10-CM

## 2024-08-01 DIAGNOSIS — Z79899 Other long term (current) drug therapy: Secondary | ICD-10-CM

## 2024-08-01 DIAGNOSIS — D509 Iron deficiency anemia, unspecified: Secondary | ICD-10-CM

## 2024-08-01 MED ORDER — HYDROXYCHLOROQUINE SULFATE 200 MG PO TABS: 400.0000 mg | ORAL_TABLET | Freq: Every day | ORAL | 0 refills | Status: DC

## 2024-08-04 ENCOUNTER — Encounter: Payer: Self-pay | Admitting: Pulmonary Disease

## 2024-08-06 ENCOUNTER — Ambulatory Visit

## 2024-08-06 VITALS — BP 137/75 | HR 91 | Temp 98.7°F | Resp 16 | Ht 63.0 in | Wt 217.8 lb

## 2024-08-06 DIAGNOSIS — D509 Iron deficiency anemia, unspecified: Secondary | ICD-10-CM | POA: Diagnosis not present

## 2024-08-06 MED ORDER — SODIUM CHLORIDE 0.9 % IV SOLN
300.0000 mg | Freq: Once | INTRAVENOUS | Status: AC
  Administered 2024-08-06: 300 mg via INTRAVENOUS
  Filled 2024-08-06: qty 10

## 2024-08-06 NOTE — Progress Notes (Signed)
 Diagnosis: Iron  Deficiency Anemia  Provider:  Praveen Mannam MD  Procedure: IV Infusion  IV Type: Peripheral, IV Location: L Forearm  Venofer  (Iron  Sucrose), Dose: 300 mg  Infusion Start Time: 1406  Infusion Stop Time: 1541  Post Infusion IV Care: Observation period completed and Peripheral IV Discontinued  Discharge: Condition: Good, Destination: Home . AVS Declined  Performed by:  Tabita Corbo, RN

## 2024-08-11 ENCOUNTER — Other Ambulatory Visit: Payer: Self-pay | Admitting: Endocrinology

## 2024-08-11 ENCOUNTER — Other Ambulatory Visit: Payer: Self-pay | Admitting: Family Medicine

## 2024-08-11 DIAGNOSIS — E1165 Type 2 diabetes mellitus with hyperglycemia: Secondary | ICD-10-CM

## 2024-08-11 DIAGNOSIS — E7800 Pure hypercholesterolemia, unspecified: Secondary | ICD-10-CM

## 2024-08-11 NOTE — Telephone Encounter (Signed)
 Paperwork was returned at her office visit.

## 2024-08-14 ENCOUNTER — Ambulatory Visit (INDEPENDENT_AMBULATORY_CARE_PROVIDER_SITE_OTHER)

## 2024-08-14 VITALS — BP 133/81 | HR 94 | Temp 98.1°F | Resp 18 | Ht 63.0 in | Wt 220.0 lb

## 2024-08-14 DIAGNOSIS — D509 Iron deficiency anemia, unspecified: Secondary | ICD-10-CM

## 2024-08-14 MED ORDER — SODIUM CHLORIDE 0.9 % IV SOLN
300.0000 mg | Freq: Once | INTRAVENOUS | Status: AC
  Administered 2024-08-14: 300 mg via INTRAVENOUS
  Filled 2024-08-14: qty 15

## 2024-08-14 NOTE — Progress Notes (Signed)
 Diagnosis: Iron  Deficiency Anemia  Provider:  Praveen Mannam MD  Procedure: IV Infusion  IV Type: Peripheral, IV Location: L Antecubital  Venofer  (Iron  Sucrose), Dose: 300 mg  Infusion Start Time: 1352  Infusion Stop Time: 1532  Post Infusion IV Care: Observation period completed and Peripheral IV Discontinued  Discharge: Condition: Good, Destination: Home . AVS Declined  Performed by:  Leita FORBES Miles, LPN

## 2024-09-04 ENCOUNTER — Ambulatory Visit: Admitting: Endocrinology

## 2024-09-08 ENCOUNTER — Ambulatory Visit: Payer: Self-pay

## 2024-09-08 NOTE — Telephone Encounter (Signed)
 FYI Only or Action Required?: FYI only for provider.  Patient was last seen in primary care on 05/12/2024 by Holly Elsie Sayre, MD.  Called Nurse Triage reporting Urinary Frequency.  Symptoms began several days ago.  Interventions attempted: OTC medications: Tylenol  and Rest, hydration, or home remedies.  Symptoms are: unchanged.  Triage Disposition: See HCP Within 4 Hours (Or PCP Triage)  Patient/caregiver understands and will follow disposition?: Yes   Copied from CRM [id number]. Topic: Clinical - Red Word Triage >> Sep 08, 2024 12:06 PM Holly Ortiz wrote: Red Word that prompted transfer to Nurse Triage: Pt stated she possibly has a UTI. Pt is feeling burning, frequent urination and low back pain. When asked how long pt stated for about 3 days, symptoms worsened over the weekend. Warm transferred to nurse triage. Reason for Disposition  Side (flank) or lower back pain present  Answer Assessment - Initial Assessment Questions Additional info: No acute appointments available today at pcp office, patient is at work and unable to go to alternate regional location, she plans to attend walk in urgent care today for evaluation and treatment.    1. SEVERITY: How bad is the pain?  (e.g., Scale 1-10; mild, moderate, or severe)     Burning  2. FREQUENCY: How many times have you had painful urination today?      Increased  3. PATTERN: Is pain present every time you urinate or just sometimes?      intermittent 4. ONSET: When did the painful urination start?      3 days ago  5. FEVER: Do you have a fever? If Yes, ask: What is your temperature, how was it measured, and when did it start?     denies 6. PAST UTI: Have you had a urine infection before? If Yes, ask: When was the last time? and What happened that time?      Yes, two this year 7. CAUSE: What do you think is causing the painful urination?  (e.g., UTI, scratch, Herpes sore)     UTI 8. OTHER SYMPTOMS: Do you  have any other symptoms? (e.g., blood in urine, flank pain, genital sores, urgency, vaginal discharge)     Low back pain 10/10 but Tylenol  helpful  9. PREGNANCY: Is there any chance you are pregnant? When was your last menstrual period?  Protocols used: Urination Pain - Female-A-AH

## 2024-09-16 ENCOUNTER — Ambulatory Visit: Payer: Self-pay | Admitting: Endocrinology

## 2024-09-16 ENCOUNTER — Encounter: Payer: Self-pay | Admitting: Endocrinology

## 2024-09-16 ENCOUNTER — Ambulatory Visit (INDEPENDENT_AMBULATORY_CARE_PROVIDER_SITE_OTHER): Admitting: Endocrinology

## 2024-09-16 VITALS — BP 124/84 | HR 94 | Ht 63.0 in | Wt 221.0 lb

## 2024-09-16 DIAGNOSIS — E1165 Type 2 diabetes mellitus with hyperglycemia: Secondary | ICD-10-CM

## 2024-09-16 DIAGNOSIS — Z794 Long term (current) use of insulin: Secondary | ICD-10-CM | POA: Diagnosis not present

## 2024-09-16 LAB — POCT GLYCOSYLATED HEMOGLOBIN (HGB A1C): Hemoglobin A1C: 10.1 % — AB (ref 4.0–5.6)

## 2024-09-16 MED ORDER — GLIMEPIRIDE 4 MG PO TABS: 4.0000 mg | ORAL_TABLET | Freq: Every day | ORAL | 3 refills | Status: AC

## 2024-09-16 MED ORDER — TRESIBA FLEXTOUCH 100 UNIT/ML ~~LOC~~ SOPN: 30.0000 [IU] | PEN_INJECTOR | Freq: Every day | SUBCUTANEOUS | 4 refills | Status: AC

## 2024-09-16 NOTE — Progress Notes (Signed)
 Outpatient Endocrinology Note Holly Brennan Litzinger, MD  09/16/24  Patient Name: Holly Ortiz   DOB : [birth year] MRN # [id number]                                                    REASON OF VISIT: Follow up of type 2 diabetes mellitus  PCP: Berneta Elsie Sayre, MD  HISTORY OF PRESENT ILLNESS:   Holly Ortiz is a 58 y.o. old female with past medical history listed below, is here for follow up of type 2 diabetes mellitus.   Pertinent Diabetes History: Patient was previously seen by Dr. Von and was last time seen in August 2022. _Diagnosed as type 2 at the age of 7 years.  She was initially treated with oral antidiabetic medications including metformin .  Insulin  therapy was started around 2015 timeframe.  Patient has uncontrolled type 2 diabetes mellitus.    Chronic Diabetes Complications : Retinopathy: yes, no records available unknown. Last ophthalmology exam was done on Due, reports following regularly. Nephropathy: no Peripheral neuropathy: no Coronary artery disease: no Stroke: no  Relevant comorbidities and cardiovascular risk factors: Obesity: yes Body mass index is 39.15 kg/m.  Hypertension: yes Hyperlipidemia. Yes, on a statin.  Current / Home Diabetic regimen includes:  Humulin  U500 :30-50 units with breakfast, 0-30 units with lunch (mostly does not take) and 40 -60 units with supper, 20-40 units at bedtime. Glimepiride  4 mg daily.  Prior diabetic medications: Ozempic  is stopped due to nausea and vomiting. Metformin  is stopped due to GI intolerance. She used to be on Amaryl  and Januvia  in the past. Mounjaro  : Was tried after visit in August 2024, and stopped due to nausea vomiting and diarrhea. Farxiga  was stopped in March 2025 due to UTI.  Glycemic data:   Accu-Chek guide glucometer, not able to download reviewed directly from the meter.  Average blood sugar in the last 7 days 285, last 30 days 293, last 30 days 281, last 90 days 251.  Some of the blood sugar  are 296, 371, 104, 200, 165, 154, 456, 293, 465.  She does not want to use continuous glucose monitor.  She does not want anything in the body.  Hypoglycemia: Patient has no hypoglycemic episodes. Patient has hypoglycemia awareness.  Factors modifying glucose control: 1.  Diabetic diet assessment: Typically eats large breakfast and dinner and smaller portion of lunch.  Occasionally eats bedtime snack usually yogurts. Food includes rice / pasta etc.  2.  Staying active or exercising: No formal exercise.  3.  Medication compliance: compliant some of the time.  Interval history  Hemoglobin A1c staying high at 10.1%.  Diabetes is been as reviewed above.  She has been taking glimepiride  4 mg daily despite increasing to 6 mg in last visit.  She has started U-500 insulin  as mentioned above.  She sometimes needs to take insulin  at bedtime for correcting hyperglycemia.  No more UTI after restarting Farxiga .  She has no other complaints today.  REVIEW OF SYSTEMS As per history of present illness.   PAST MEDICAL HISTORY: 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-I)    Anxiety    on meds   Asthma    childhood   Cataract 2015   bilateral   Constipation    takes Miralax  daily  Depression    on meds   Diabetes mellitus    type II-on meds   Hyperlipidemia    on meds   Hypertension    on meds   Ketoacidosis 07/23/2024   Kidney stone    Lupus 2022   dx 02/2021   Seasonal allergies    Urticaria, chronic    UTI (lower urinary tract infection)     PAST SURGICAL HISTORY: Past Surgical History:  Procedure Laterality Date   ABDOMINAL HYSTERECTOMY  08/07/2019   BREAST ENHANCEMENT SURGERY  1993   breast lift/reduction   CATARACT EXTRACTION, BILATERAL Bilateral 2014   CATARACT EXTRACTION, BILATERAL Bilateral 04/2022   [place]ONOSCOPY  2014   in [place]   EXTRACORPOREAL SHOCK WAVE LITHOTRIPSY Left 06/16/2024   Procedure: LITHOTRIPSY,  ESWL;  Surgeon: Lovie Arlyss CROME, MD;  Location: [facility];  Service: Urology;  Laterality: Left;   LAPAROSCOPIC VAGINAL HYSTERECTOMY WITH SALPINGO OOPHORECTOMY Bilateral 08/07/2019   Procedure: LAPAROSCOPIC ASSISTED VAGINAL HYSTERECTOMY WITH SALPINGO OOPHORECTOMY, possible abdominal hysterectomy;  Surgeon: Curlene Agent, MD;  Location: [facility] OR;  Service: Gynecology;  Laterality: Bilateral;  possible abdominal hysterectomy pt is diabetic and hypertensive   SOFT TISSUE MASS EXCISION Right 2016   [place]   TUBAL LIGATION  1995   tummy tuck  1993    ALLERGIES: Allergies  Allergen Reactions   Ozempic  (0.25 Or 0.5 Mg-Dose) [Semaglutide (0.25 Or 0.5mg -Dos)] Nausea And Vomiting   Lisinopril  Swelling and Other (See Comments)    Angioedema, including likely GI involvement   Metformin  And Related Diarrhea and Nausea And Vomiting   Pioglitazone  Hydrochloride Other (See Comments)    Reaction not stated    FAMILY HISTORY:  Family History  Problem Relation Age of Onset   Colon polyps Mother 76   Colon cancer Mother 56   Hypertension Mother    Diabetes Father    Hypertension Father    COPD Father        Cause of death   Hypertension Sister    Diabetes Sister    Cancer Sister        Brain glioblastoma   Diabetes Sister    Hypertension Brother    Hyperlipidemia Other    Heart disease Other    Esophageal cancer Neg Hx    Stomach cancer Neg Hx    Rectal cancer Neg Hx     SOCIAL HISTORY: Social History   Socioeconomic History   Marital status: Married    Spouse name: Dorn   Number of children: 4   Years of education: 13--1.5 years of college   Highest education level: Some college, no degree  Occupational History   Occupation: banking  Tobacco Use   Smoking status: Never    Passive exposure: Never   Smokeless tobacco: Never  Vaping Use   Vaping status: Never Used  Substance and Sexual Activity   Alcohol use: No   Drug use: No   Sexual activity: Yes    Birth  control/protection: Surgical    Comment: BTL  Other Topics Concern   Not on file  Social History Narrative   Not on file   Social Drivers of Health   Financial Resource Strain: Low Risk  (10/25/2023)   Overall Financial Resource Strain (CARDIA)    Difficulty of Paying Living Expenses: Not very hard  Food Insecurity: No Food Insecurity (07/23/2024)   Hunger Vital Sign    Worried About Running Out of Food in the Last Year: Never true    Ran Out of Food in  the Last Year: Never true  Transportation Needs: No Transportation Needs (07/23/2024)   PRAPARE - Administrator, Civil Service (Medical): No    Lack of Transportation (Non-Medical): No  Physical Activity: Unknown (10/25/2023)   Exercise Vital Sign    Days of Exercise per Week: 0 days    Minutes of Exercise per Session: Not on file  Stress: Patient Declined (10/25/2023)   [employer] of Occupational Health - Occupational Stress Questionnaire    Feeling of Stress : Patient declined  Social Connections: Moderately Integrated (05/12/2024)   Social Connection and Isolation Panel    Frequency of Communication with Friends and Family: More than three times a week    Frequency of Social Gatherings with Friends and Family: Three times a week    Attends Religious Services: More than 4 times per year    Active Member of Clubs or Organizations: No    Attends Engineer, structural: Not on file    Marital Status: Married    MEDICATIONS:  Current Outpatient Medications  Medication Sig Dispense Refill   ACCU-CHEK GUIDE TEST test strip 1 EACH BY IN VITRO ROUTE IN THE MORNING, AT NOON, AND AT BEDTIME 100 strip 3   Accu-Chek Softclix Lancets lancets 1 each 3 (three) times daily.     acetaminophen  (TYLENOL ) 500 MG tablet Take 2 tablets (1,000 mg total) by mouth every 6 (six) hours as needed. 60 tablet 0   albuterol  (VENTOLIN  HFA) 108 (90 Base) MCG/ACT inhaler Inhale 2 puffs into the lungs every 6 (six) hours as needed for  wheezing or shortness of breath. 8 g 0   amLODipine  (NORVASC ) 10 MG tablet TAKE 1 TABLET BY MOUTH EVERY DAY 90 tablet 3   atorvastatin  (LIPITOR) 40 MG tablet TAKE 1 TABLET BY MOUTH EVERY DAY 90 tablet 2   Blood Glucose Monitoring Suppl DEVI 1 each by Does not apply route in the morning, at noon, and at bedtime. May substitute to any manufacturer covered by patient's insurance. 1 each 0   Cholecalciferol (VITAMIN D3) 50 MCG (2000 UT) TABS Take 2,000 Units by mouth daily.     cyanocobalamin  (VITAMIN B12) 1000 MCG tablet Take 1 tablet (1,000 mcg total) by mouth daily. 90 tablet 0   cyclobenzaprine  (FLEXERIL ) 5 MG tablet TAKE 1 TABLET BY MOUTH AT BEDTIME AS NEEDED FOR MUSCLE SPASMS. 30 tablet 1   escitalopram  (LEXAPRO ) 20 MG tablet TAKE 1 TABLET BY MOUTH EVERY DAY 90 tablet 3   glimepiride  (AMARYL ) 4 MG tablet Take 1.5 tablets (6 mg total) by mouth daily before breakfast. 270 tablet 3   hydroxychloroquine  (PLAQUENIL ) 200 MG tablet Take 2 tablets (400 mg total) by mouth daily. 180 tablet 0   Insulin  Pen Needle (BD PEN NEEDLE NANO 2ND GEN) 32G X 4 MM MISC Use four times daily to inject insulin . 200 each 11   insulin  regular human CONCENTRATED (HUMULIN  R U-500 KWIKPEN) 500 UNIT/ML KwikPen INJECT 40 UNITS UNDER THE SKIN BEFORE BREAKFAST, 20-25 UNITS BEFORE LUNCH AND 40 - 60 UNITS AT SUPPER. Maximum 125 units/day. 18 mL 6   iron  polysaccharides (NIFEREX) 150 MG capsule Take 1 capsule (150 mg total) by mouth daily. 30 capsule 2   Melatonin 5 MG TABS Take 5 mg by mouth at bedtime as needed (FOR SLEEP).     montelukast  (SINGULAIR ) 10 MG tablet TAKE 1 TABLET BY MOUTH EVERYDAY AT BEDTIME 90 tablet 1   phenazopyridine  (PYRIDIUM ) 200 MG tablet Take 1 tablet (200 mg total) by mouth  3 (three) times daily. 6 tablet 0   polyethylene glycol (MIRALAX  / GLYCOLAX ) 17 g packet Take 17 g by mouth daily as needed for mild constipation.     vitamin C  (VITAMIN C ) 500 MG tablet Take 1 tablet (500 mg total) by mouth daily. 30  tablet 2   Vitamin D , Ergocalciferol , (DRISDOL ) 1.25 MG (50000 UNIT) CAPS capsule Take 1 capsule (50,000 Units total) by mouth every 7 (seven) days. 12 capsule 0   zinc gluconate 50 MG tablet Take 50 mg by mouth daily.      Current Facility-Administered Medications  Medication Dose Route Frequency Provider Last Rate Last Admin   0.9 %  sodium chloride  infusion  500 mL Intravenous Once Stacia Glendia BRAVO, MD        PHYSICAL EXAM: Vitals:   09/16/24 0845  BP: 124/84  Pulse: 94  SpO2: 98%  Weight: 221 lb (100.2 kg)  Height: 5' 3 (1.6 m)     Body mass index is 39.15 kg/m.  Wt Readings from Last 3 Encounters:  09/16/24 221 lb (100.2 kg)  08/14/24 220 lb (99.8 kg)  08/06/24 217 lb 12.8 oz (98.8 kg)    General: Well developed, well nourished female in no apparent distress.  HEENT: AT/[place], no external lesions.  Eyes: Conjunctiva clear and no icterus. Neck: Neck supple  Lungs: Respirations not labored Neurologic: Alert, oriented, normal speech Extremities / Skin: Dry.   Psychiatric: Does not appear depressed or anxious  Diabetic Foot Exam - Simple   No data filed   LABS Reviewed Lab Results  Component Value Date   HGBA1C 10.1 (A) 09/16/2024   HGBA1C 10.2 (A) 05/20/2024   HGBA1C 12.0 (A) 02/12/2024   Lab Results  Component Value Date   FRUCTOSAMINE 307 (H) 04/14/2021   FRUCTOSAMINE 314 (H) 09/30/2020   FRUCTOSAMINE 408 (H) 04/09/2020   Lab Results  Component Value Date   CHOL 172 04/24/2023   HDL 42.10 04/24/2023   LDLCALC 85 07/11/2021   LDLDIRECT 70.0 05/12/2024   TRIG 250.0 (H) 04/24/2023   CHOLHDL 4 04/24/2023   Lab Results  Component Value Date   MICRALBCREAT 67 (H) 02/12/2024   MICRALBCREAT 56.8 (H) 12/18/2017   Lab Results  Component Value Date   CREATININE 0.59 07/25/2024   Lab Results  Component Value Date   GFR 69.20 05/12/2024    ASSESSMENT / PLAN  1. Uncontrolled type 2 diabetes mellitus with hyperglycemia, with long-term current use of  insulin  (HCC)    Diabetes Mellitus type 2 poorly controlled.  Diabetic retinopathy. - Diabetic status / severity: Uncontrolled.   Lab Results  Component Value Date   HGBA1C 10.1 (A) 09/16/2024    - Hemoglobin A1c goal : <6.5%  Discussed about diabetes mellitus and potential chronic complications.  Discussed about importance of controlling blood sugar to prevent chronic complications.  Discussed about compliance with diabetic medication including insulin .   She was not able to tolerate multiple GLP-1 receptor agonist in the past.  She had a wide range of variation on using Humulin  U-500 but she feels comfortable this way she will adjust based on meal size and blood sugar.  Still with uncontrolled diabetes and hyperglycemia.  She had few blood sugar in normal range with as well as 104.  Denies hypoglycemic symptoms.  I would like to add basal long-acting insulin  in addition to Humulin  U-500 as follows.  - Medications: See below  Adjust Humulin  U500 : 30 - 50 units with breakfast,0- 30 units with lunch (mostly  not taking)  and 40-60 units with supper and 20-40 units at bedtime.  She mostly skip insulin  at lunchtime.   Start Tresiba  30 units daily.  Continue glimepiride  4 mg daily.  - Home glucose testing: Before meals and bedtime 4 times a day.  She declined CGM.  - Discussed/ Gave Hypoglycemia treatment plan.  # Consult : not required at this time.   # Annual urine for microalbuminuria/ creatinine ratio, + microalbuminuria currently, continue ACE/ARB , not on ACE/ARB at this time.  Not on SGLT2 inhibitor due to UTI.  Will consider ACE/ARB in the future visit. Last  Lab Results  Component Value Date   MICRALBCREAT 67 (H) 02/12/2024    # Foot check nightly.  # She has ? diabetic retinopathy.  Advised for regular follow-up with ophthalmology.  - Diet: Make healthy diabetic food choices - Life style / activity / exercise: Discussed about regular exercise.  2. Blood  pressure  -  BP Readings from Last 1 Encounters:  09/16/24 124/84    - Control is in target.  - No change in current plans.  3. Lipid status / Hyperlipidemia - Last  Lab Results  Component Value Date   LDLCALC 85 07/11/2021   - Continue atorvastatin  40 mg daily.  Managed by primary care provider.   Kylar was seen today for follow-up.  Diagnoses and all orders for this visit:  Uncontrolled type 2 diabetes mellitus with hyperglycemia, with long-term current use of insulin  (HCC) -     POCT glycosylated hemoglobin (Hb A1C)    DISPOSITION Follow up in clinic in 3 months suggested.   All questions answered and patient verbalized understanding of the plan.  Holly Signora Zucco, MD [facility] Endocrinology [facility] [address] [place], [address] [place], [place] [address] Phone # [phone]  At least part of this note was generated using voice recognition software. Inadvertent word errors may have occurred, which were not recognized during the proofreading process.

## 2024-09-16 NOTE — Patient Instructions (Signed)
  Humulin  U500, adjust as take + 5 units more than what you are doing now, take 30 minutes before eating.   Glimepiride  4 mg daily.   Start tresiba  30 units daily, in the morning.

## 2024-10-23 NOTE — Progress Notes (Signed)
 Office Visit Note  Patient: Holly Ortiz             Date of Birth: September 03, 1966           MRN: [id number]             PCP: Berneta Elsie Sayre, MD Referring: Berneta Elsie Sayre,* Visit Date: 11/04/2024   Subjective:   History of Present Illness:   Discussed the use of AI scribe software for clinical note transcription with the patient, who gave verbal consent to proceed.  History of Present Illness   Holly Ortiz is a 58 y.o. 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 She presents with persistent musculoskeletal pain and fatigue. She is accompanied by her husband.  She experiences persistent musculoskeletal pain, primarily in her knees and lower back. The pain is severe, sometimes requiring her to use her hands to assist in climbing stairs. She also has shooting pains in her arms, legs, and feet. The lower back pain is constant and unrelenting. She has a remote history of falling down stairs and injuring her tailbone and wonders if this could be related.  She experiences significant fatigue, which has improved somewhat following an iron  infusion, but she still feels exhausted after minimal activity. Simple tasks, such as doing laundry, necessitate rest. Her work exacerbates the fatigue, often leading her to stay in bed all day when not working. Her last hemoglobin A1c was 10.1%, an improvement from earlier in the year but still above the target range. Hgb was 8.7 in August before her iron  infusion.  She has noticed palpitations and shortness of breath upon waking at night. Although she has not undergone a sleep study, she was monitored in the hospital and questioned about potential sleep apnea based on telemetry abnormalities noted. She reports waking up feeling unable to breathe and experiences frequent nocturnal awakenings to use the bathroom.  No recent significant illnesses or infections. She does not frequently  experience colds or other common illnesses. No recent viral illnesses or need for antibiotics. No significant changes in her health aside from the persistent pain and fatigue.       Previous HPI 08/01/2024 Holly Ortiz is a 58 y.o. 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      She experiences significant fatigue and weakness, describing it as feeling 'like a truck hit me' upon waking. Her energy suffices only for seven and a half to eight hours of work, after which she eats, showers, and goes to bed. She is scheduled for iron  infusions next week, having never had them before except during a recent hospital stay. Her iron  levels have decreased again after previously stabilizing post-hysterectomy. Despite a diet rich in spinach and other iron -containing foods, her body is not absorbing iron  efficiently. Her last hemoglobin is 8.7, and her iron  saturation is 7%.   She experiences cold symptoms, which exacerbate her weakness. Despite taking all her medications and vitamins, she feels very weak. She has a history of skin changes, which have improved significantly after receiving shots, and she no longer experiences pain or dry patches. However, she recently experienced a 'butterfly' rash and dryness in her mouth for the first time, along with persistent tiredness.   Her blood sugar levels have improved since hospitalization, with her A1c decreasing from 14 to 10. She experienced a spike to 520 during a period of illness and stress, which she attributes to  not taking her medication due to vomiting and lack of energy. Her blood sugar is now more stable, with recent readings around 109, though occasionally reaching 199-200.   She reports high stress levels, particularly related to home renovations and work conditions, which she feels negatively impact her health. She experiences anxiety and occasional crying spells, feeling overwhelmed by her  health issues at 42. She describes anhedonia and fatigue, but does not feel typically 'blue' or 'down'.   She has not been taking diclofenac  for hip pain, which has improved greatly after injections in May though she still experiences tightness and stiffness.         Previous HPI 05/01/2024 Holly Ortiz is a 58 y.o. 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     She has experienced episodes of hematuria, initially reported to her endocrinologist. A subsequent urinalysis revealed a urinary tract infection, for which she was prescribed antibiotics. She has a history of a large kidney stone in her left kidney, discovered during a hospital visit due to severe pain. The stone was not treated at the time, and she was discharged. During her hospital stay, she contracted COVID-19, leading to a week-long absence from work. She continues to experience pain, which was particularly severe during a recent vacation.   She describes persistent and worsening pain in her hips, radiating downwards and associated with significant weakness. This pain has severely impacted her mobility, necessitating the use of a wheelchair during a recent vacation. She experiences frequent falls due to lack of strength, with a notable incident occurring at work two weeks prior to the visit. The difficulty in rising from seated positions has been ongoing for six to seven years but has worsened recently.   She notes swelling in her feet, describing them as resembling 'elephant feet' at times, although they are less swollen today. She also reports a sensation of movement within her feet when they are swollen.   Her current medications include hydroxychloroquine  and Claritin  for hives. She denies any issues with hydroxychloroquine  and takes Claritin  as needed for hives, which she experienced upon waking today. She has not been taking any over-the-counter medications for  inflammation.     Previous HPI 01/28/2024 Holly Ortiz is a 58 y.o. 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     She has been experiencing chronic hives with intermittent episodes and a brief period of improvement lasting about a week. Benadryl  provides some relief but causes significant sedation. The hives are less intense today, likely due to taking Benadryl  over the weekend.   She describes severe fatigue, with energy only sufficient for work and returning home. Pain starts behind her neck and radiates to her toes, described as the worst pain she has ever experienced. Swelling is noted, particularly in her hands, affecting how her clothes fit. She also feels her abdomen is hard.   She has experienced several falls, often losing balance, particularly when getting out of her car or doing housework. Recent falls include one while mopping the floor and another significant fall down the stairs. No neuropathy is reported, and her endocrinologist does not suspect it either.   Urinary incontinence has been present for over a year, with increased frequency in the past two to three years. She has not been evaluated by a urologist. Occasional bowel incontinence is also reported.   She reports skin changes, describing 'fish skin' that becomes hard and  flaky, and notes her hair has become very dry and thin, with significant hair loss. She takes Advil  for pain, typically three tablets before work and two after dinner, but sometimes takes four tablets at a time.   Anxiety is managed with Lexapro , but she notes it has been 'crazy' at times recently.      Previous HPI 07/26/2023 Holly Ortiz is a 58 y.o. 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  After the last visit she tried resuming the duloxetine  30 mg daily but felt some intermittent increased anxiety or agitation with  the medicine so stopped taking it and the symptoms improved.  At least 1 episode of increased joint swelling most prominent at her right hand and the third finger this was about 3 weeks ago and resolved over the course of about 1 week.  Some itching and rashes in a few areas most persistently on the right elbow with a patch of red itchy skin this is partially improved with topical emollients. Currently she feels very poorly was started on Mounjaro  by her endocrinologist took the first dose 2 weeks ago.  Since she started this experience severe nausea loss of appetite and loose watery diarrhea with multiple small bowel movements after any solid oral intake.  Took the second dose 1 week ago and symptoms have been even worse often not even tolerating broth or other bland liquids.  Feels very drained and fatigued now.  Broke out in hives throughout her torso and worsening of skin rash on the arms for the past few days.  She says this is pretty typical reaction for her whenever she gets sick.   Previous HPI 05/03/2023 Holly Ortiz is a 58 y.o. 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  Her previously worsened widespread symptoms of fatigue did get somewhat better after our visit last month feels the duloxetine  was beneficial for joint pains also with her brain fog.  Her prescription ran out before this visit and so she came off of it and noticed some worsening in pain again.  But she suffered a fall 3 weeks ago down the stairs and hit the back of her head.  Initially did not think much of it subsequently has been feeling worsening headaches in the back of the head and pain in her neck area.  Also is noticing numbness radiating down to the fourth and fifth fingers in both hands.  She was seen at the ED and had a CT scan with no evidence of cervical spine injury and no hemorrhage.   Previous HPI 03/12/23 Holly Ortiz is a 58 y.o. female here for  follow up for UCTD on hydroxychloroquine  400 mg daily symptoms currently in exacerbation for the past 3 weeks.  She is having increased pain in multiple areas neck shoulders arms as well as the low back and tailbone region.  She is feeling persistently fatigued every day also increased headaches.  She notices some more swelling in both hands then has been present for at least the past year.  She does describe very poor sleep during these few weeks and a very high level of work stress contributing.   Previous HPI 01/25/23 Holly Ortiz is a 58 y.o. female here for follow up for UCTD with inflammatory arthritis and chronic urticaria on hydroxychloroquine  400 mg daily.  Past few months she reports worsening fatigue and concentration and short-term memory problems.  Also increased pain  especially in the low back bilaterally.  Does not have pain or numbness radiation into the legs.  She is experienced some weakness and difficulty standing back up from the floor or crouched position.  Frequently wakes up with low back pain that improves after taking 2 regular Advil  and moving around for a while.  Has not had severe outbreak of hives but keeps getting a small number of these around the neck and back of the scalp.  Follow-up with primary care office in December still had poorly controlled diabetes with hemoglobin A1c 11.8.  Skin rash behind the right knee is unchanged has a new erythematous rash in the right elbow. She has been under increased stress with work changes after her [employer] branch angry and was close now working in [place] still getting used to the change in location and coworkers.   Previous HPI 07/31/22 Holly Ortiz is a 58 y.o. female here for follow up for UCTD with symptoms of inflammatory arthritis and chronic urticaria on HCQ 400 mg daily. She has generally been doing well without severe flare ups. She has followed up with her ophthalmologist but not remembered to specifically  address her HCQ screening exam. Since last week she is having new left flank pain radiating around the side somewhat along bottom of ribs. She does not recall any preceding illness or injury. She does notice increased urinary frequency during this time. No dysuria or gross hematuria. She has a history of a kidney stone once about 30 years ago during pregnancy and last known UTI about 10 years ago. She has a new dog and has a lot of new scratches from this.   Previous HPI 01/31/2022 Holly Ortiz is a 58 y.o. female here for follow up for inflammatory arthritis and chronic urticaria on HCQ 400 mg daily. She had a significant flare up of symptoms around Thanksgiving that resolved after a couple weeks without specific intervention. She takes advil  as needed when joint pain and swelling are worse and when really bad sometimes flexeril . She had another shorter episode of symptoms for about 4 days earlier this year. Most common joint swelling lately is at the 3rd knuckle of each hand, knee swelling has been less common. She did not have many episodes of hives outside of these flares. She is starting to have nasal congestion and hoarseness of voice she thinks is seasonal allergies returning.   Previous HPI 08/02/21 Holly Ortiz is a 58 y.o. female here for follow up for inflammatory arthritis and chronic urticarial rashes on hydroxychloroquine  400 mg PO daily. Due to low back and knee pain that seemed noninflammatory also recommended trial of oral muscle relaxant as needed at night.  So far she feels a very good improvement in her overall symptoms.  She has not had any recurrence of skin rash since at least 2 months ago.  Joint pains are doing well.  She still has some of the fatigue and concentration difficulty though this is also partially better.   02/22/21 Holly Ortiz is a 58 y.o. female here for evaluation of positive ANA with chronic joint pains, fatigue, and intermittent hives for years. She  describes symptoms starting since around 2007. She has had pain in joints and muscles at various areas to some extent most of the time. She also developed a problem with recurrent urticaria rashes at multiple areas, most often after increased stress or other provocative events such as infection and antibiotic use. Since that time she has had workup with  allergy and immunology clinics and dermatology clinic with no specific cause able to be found. A more recent symptom is change in sensation in her legs and feet. She notices swelling and discomfort after prolonged standing or prolonged sitting. She had a few episodes of becoming dizzy and unstable after standing and a few falls.   Labs reviewed 01/2021 ANA 1:80 homogenous, speckled CBC wnl TSH wnl CK wnl CRP wnl   Review of Systems  Constitutional:  Positive for fatigue.  HENT:  Positive for mouth dryness. Negative for mouth sores.   Eyes:  Positive for dryness.  Respiratory:  Positive for shortness of breath.   Cardiovascular:  Positive for chest pain and palpitations.  Gastrointestinal:  Positive for diarrhea. Negative for blood in stool and constipation.  Endocrine: Positive for increased urination.  Genitourinary:  Positive for involuntary urination.  Musculoskeletal:  Positive for joint pain, gait problem, joint pain, joint swelling, myalgias, muscle weakness, morning stiffness, muscle tenderness and myalgias.  Skin:  Positive for rash, hair loss and sensitivity to sunlight. Negative for color change.  Allergic/Immunologic: Negative for susceptible to infections.  Neurological:  Positive for dizziness and headaches.  Hematological:  Negative for swollen glands.  Psychiatric/Behavioral:  Negative for depressed mood and sleep disturbance. The patient is nervous/anxious.     PMFS History:  Patient Active Problem List   Diagnosis Date Noted   Iron  deficiency anemia, unspecified 07/24/2024   UTI (urinary tract infection) 07/23/2024    Nausea and vomiting 07/23/2024   IT band syndrome 05/01/2024   Acute respiratory failure with hypoxia (HCC) 02/24/2024   Headache 05/03/2023   Other fatigue 01/25/2023   Rash and other nonspecific skin eruption 01/25/2023   Urinary frequency 08/02/2022   Left flank pain 07/31/2022   Pain in left knee 05/03/2021   Arthralgia 02/24/2021   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   Iron  deficiency 07/07/2020   Left low back pain 01/14/2020   Anemia 12/02/2019   Lightheadedness 12/02/2019   Menorrhagia 08/07/2019   Abnormal uterine bleeding 08/07/2019   Anxiety and depression 07/03/2018   DKA (diabetic ketoacidosis) (HCC) 03/28/2018   Tachycardia 03/28/2018   Urticaria 03/28/2018   Hyperosmolar hyperglycemia 01/04/2018   AKI (acute kidney injury) 01/04/2018   Hyperglycemia 01/03/2018   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-I)    Asthma    Type 2 diabetes mellitus with complication, with long-term current use of insulin  (HCC)    Elevated cholesterol    Essential hypertension     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-I)    Anxiety    on meds   Asthma    childhood   Cataract 2015   bilateral   Constipation    takes Miralax  daily   Depression    on meds   Diabetes mellitus    type II-on meds   Hyperlipidemia    on meds   Hypertension    on meds   Ketoacidosis 07/23/2024   Kidney stone    Lupus 2022   dx 02/2021   Seasonal allergies    Urticaria, chronic    UTI (lower urinary tract infection)     Family History  Problem Relation Age of Onset   Colon polyps Mother 73   Colon cancer Mother 74   Hypertension Mother    Diabetes Father    Hypertension Father    COPD Father        Cause of death  Hypertension Sister    Diabetes Sister    Cancer Sister        Brain glioblastoma   Diabetes Sister    Hypertension Brother    Hyperlipidemia Other    Heart  disease Other    Esophageal cancer Neg Hx    Stomach cancer Neg Hx    Rectal cancer Neg Hx    Past Surgical History:  Procedure Laterality Date   ABDOMINAL HYSTERECTOMY  08/07/2019   BREAST ENHANCEMENT SURGERY  1993   breast lift/reduction   CATARACT EXTRACTION, BILATERAL Bilateral 2014   CATARACT EXTRACTION, BILATERAL Bilateral 04/2022   COLONOSCOPY  2014   in [place]   EXTRACORPOREAL SHOCK WAVE LITHOTRIPSY Left 06/16/2024   Procedure: LITHOTRIPSY, ESWL;  Surgeon: Lovie Arlyss CROME, MD;  Location: [facility];  Service: Urology;  Laterality: Left;   LAPAROSCOPIC VAGINAL HYSTERECTOMY WITH SALPINGO OOPHORECTOMY Bilateral 08/07/2019   Procedure: LAPAROSCOPIC ASSISTED VAGINAL HYSTERECTOMY WITH SALPINGO OOPHORECTOMY, possible abdominal hysterectomy;  Surgeon: Curlene Agent, MD;  Location: [facility] OR;  Service: Gynecology;  Laterality: Bilateral;  possible abdominal hysterectomy pt is diabetic and hypertensive   SOFT TISSUE MASS EXCISION Right 2016   [place]   TUBAL LIGATION  1995   tummy tuck  1993   Social History   Social History Narrative   Not on file   Immunization History  Administered Date(s) Administered   DTaP 07/04/2009   Influenza,inj,Quad PF,6+ Mos 01/05/2018, 08/28/2018, 09/05/2019, 10/05/2020   PFIZER(Purple Top)SARS-COV-2 Vaccination 02/03/2020, 03/02/2020   PNEUMOCOCCAL CONJUGATE-20 07/24/2024   Pneumococcal Polysaccharide-23 01/05/2018   Tdap 12/04/2012   Zoster Recombinant(Shingrix) 06/29/2021     Objective: Vital Signs: BP 139/73 (BP Location: Right Arm, Patient Position: Sitting, Cuff Size: Normal)   Pulse (!) 103   Temp (!) 97.4 F (36.3 C)   Resp 16   Ht 5' 3 (1.6 m)   Wt 227 lb (103 kg)   LMP 05/13/2019 Comment: patient had not had her period in a year and it came back in May and June 2020  BMI 40.21 kg/m     Physical Exam Constitutional:      Appearance: She is obese.  Eyes:     Conjunctiva/sclera: Conjunctivae normal.  Cardiovascular:     Rate and  Rhythm: Normal rate and regular rhythm.  Pulmonary:     Effort: Pulmonary effort is normal.     Breath sounds: Normal breath sounds.  Musculoskeletal:     Right lower leg: No edema.     Left lower leg: No edema.  Lymphadenopathy:     Cervical: No cervical adenopathy.  Skin:    General: Skin is warm and dry.     Findings: No rash.  Neurological:     Mental Status: She is alert.  Psychiatric:        Mood and Affect: Mood normal.      Musculoskeletal Exam:  Neck and shoulder in rounded/anterior position, tenderness to pressure across sides of neck and top of shoulders, scapular position is lifted but reversible Widespread back muscle tenderness to pressure, worse at lumbar spine than upper back Elbows full ROM no tenderness or swelling Wrists full ROM no tenderness or swelling Fingers full ROM no tenderness or swelling Hips severely limited in internal rotation, palpable tightness on lateral hip and along IT band with some tenderness Knees full ROM no tenderness or swelling, external rotation of lower leg Limited ankle dorsiflexion/overpronation   Investigation: No additional findings.  Imaging: No results found.  Recent Labs: Lab Results  Component  Value Date   WBC 6.8 07/25/2024   HGB 8.7 (L) 07/25/2024   PLT 232 07/25/2024   NA 141 07/25/2024   K 3.5 07/25/2024   CL 103 07/25/2024   CO2 29 07/25/2024   GLUCOSE 191 (H) 07/25/2024   BUN 11 07/25/2024   CREATININE 0.59 07/25/2024   BILITOT 1.1 07/22/2024   ALKPHOS 67 07/22/2024   AST 22 07/22/2024   ALT 24 07/22/2024   PROT 6.4 (L) 07/22/2024   ALBUMIN  2.8 (L) 07/22/2024   CALCIUM  8.4 (L) 07/25/2024   GFRAA >60 07/30/2019    Speciality Comments: PLQ Eye Exam: 10/12/2022 WNL @ [facility] Patient will schedule Eye Exam-patient to call and schedule  Procedures:  No procedures performed Allergies: Ozempic  (0.25 or 0.5 mg-dose) [semaglutide (0.25 or 0.5mg -dos)], Lisinopril , Metformin  and  related, and Pioglitazone  hydrochloride   Assessment / Plan:     Visit Diagnoses: Undifferentiated connective tissue disease - Plan: Anti-DNA antibody, double-stranded, Sedimentation rate, hydroxychloroquine  (PLAQUENIL ) 200 MG tablet Chronic musculoskeletal pain with fatigue, shooting pains, and palpitations. Fatigue improved post-iron  infusion but persists. Possible sleep apnea and muscle imbalance due to poor posture and work-related positioning. No hip osteoarthritis; pain likely muscular and positional. - Checking dsDNA, sed rate for disease activity monitoring - Continue HCQ 400 mg daily  High risk medication use - hydroxychloroquine  400 mg daily  Plan: Comprehensive metabolic panel with GFR, CBC with Differential/Platelet Had eye exam which was good. No serious interval infection and tolerating medication well. - Checking CBC and CMP for medication monitoring  Chronic myofascial pain syndrome Iliotibial band syndrome, unspecified laterality Chronic lower back and hip pain with stiffness, likely due to iliotibial band syndrome. Pain exacerbated by poor posture and work-related positioning. No hip osteoarthritis; pain likely muscular and positional. - Flexeril  5 mg at bedtime PRN - Referred to physical therapy for iliotibial band syndrome exercises and hip mobility improvement.  - Ordered sleep study for sleep apnea evaluation.  Iron  deficiency anemia, unspecified iron  deficiency anemia type - Plan: Iron , TIBC and Ferritin Panel - Rechecked hemoglobin and anemia markers.  Orders: Orders Placed This Encounter  Procedures   Comprehensive metabolic panel with GFR   Iron , TIBC and Ferritin Panel   CBC with Differential/Platelet   Anti-DNA antibody, double-stranded   Sedimentation rate   Meds ordered this encounter  Medications   hydroxychloroquine  (PLAQUENIL ) 200 MG tablet    Sig: Take 2 tablets (400 mg total) by mouth daily.    Dispense:  180 tablet    Refill:  0      Follow-Up Instructions: Return in about 3 months (around 02/02/2025) for UCTD/FMS/IDA f/u 3mos.   Lonni LELON Ester, MD  Note - This record has been created using [employer].  Chart creation errors have been sought, but may not always  have been located. Such creation errors do not reflect on  the standard of medical care.

## 2024-11-04 ENCOUNTER — Encounter: Payer: Self-pay | Admitting: Internal Medicine

## 2024-11-04 ENCOUNTER — Telehealth: Payer: Self-pay | Admitting: *Deleted

## 2024-11-04 ENCOUNTER — Ambulatory Visit: Attending: Internal Medicine | Admitting: Internal Medicine

## 2024-11-04 VITALS — BP 139/73 | HR 103 | Temp 97.4°F | Resp 16 | Ht 63.0 in | Wt 227.0 lb

## 2024-11-04 DIAGNOSIS — R29818 Other symptoms and signs involving the nervous system: Secondary | ICD-10-CM

## 2024-11-04 DIAGNOSIS — M7630 Iliotibial band syndrome, unspecified leg: Secondary | ICD-10-CM | POA: Diagnosis not present

## 2024-11-04 DIAGNOSIS — M25551 Pain in right hip: Secondary | ICD-10-CM

## 2024-11-04 DIAGNOSIS — G8929 Other chronic pain: Secondary | ICD-10-CM

## 2024-11-04 DIAGNOSIS — M25561 Pain in right knee: Secondary | ICD-10-CM

## 2024-11-04 DIAGNOSIS — D509 Iron deficiency anemia, unspecified: Secondary | ICD-10-CM

## 2024-11-04 DIAGNOSIS — M359 Systemic involvement of connective tissue, unspecified: Secondary | ICD-10-CM | POA: Diagnosis not present

## 2024-11-04 DIAGNOSIS — F419 Anxiety disorder, unspecified: Secondary | ICD-10-CM

## 2024-11-04 DIAGNOSIS — Z79899 Other long term (current) drug therapy: Secondary | ICD-10-CM

## 2024-11-04 DIAGNOSIS — F32A Depression, unspecified: Secondary | ICD-10-CM

## 2024-11-04 MED ORDER — HYDROXYCHLOROQUINE SULFATE 200 MG PO TABS: 400.0000 mg | ORAL_TABLET | Freq: Every day | ORAL | 0 refills | Status: AC

## 2024-11-04 NOTE — Telephone Encounter (Signed)
-----   Message from Lonni ORN [facility] sent at 11/04/2024  2:47 PM EST ----- Ms. Mcgurk needs referral to physical therapy for her hips with associated chronic low back, hip, and knee pain related - wherever close to her. Also needs referral to sleep study for suspected obstructive sleep apnea. Thanks.

## 2024-11-05 LAB — CBC WITH DIFFERENTIAL/PLATELET
Absolute Lymphocytes: 1513 {cells}/uL (ref 850–3900)
Absolute Monocytes: 554 {cells}/uL (ref 200–950)
Basophils Absolute: 31 {cells}/uL (ref 0–200)
Basophils Relative: 0.4 %
Eosinophils Absolute: 148 {cells}/uL (ref 15–500)
Eosinophils Relative: 1.9 %
HCT: 37.9 % (ref 35.9–46.0)
Hemoglobin: 11.9 g/dL (ref 11.7–15.5)
MCH: 26.6 pg — ABNORMAL LOW (ref 27.0–33.0)
MCHC: 31.4 g/dL — ABNORMAL LOW (ref 31.6–35.4)
MCV: 84.6 fL (ref 81.4–101.7)
MPV: 9.2 fL (ref 7.5–12.5)
Monocytes Relative: 7.1 %
Neutro Abs: 5554 {cells}/uL (ref 1500–7800)
Neutrophils Relative %: 71.2 %
Platelets: 331 Thousand/uL (ref 140–400)
RBC: 4.48 Million/uL (ref 3.80–5.10)
RDW: 14 % (ref 11.0–15.0)
Total Lymphocyte: 19.4 %
WBC: 7.8 Thousand/uL (ref 3.8–10.8)

## 2024-11-05 LAB — COMPREHENSIVE METABOLIC PANEL WITH GFR
AG Ratio: 1.4 (calc) (ref 1.0–2.5)
ALT: 16 U/L (ref 6–29)
AST: 13 U/L (ref 10–35)
Albumin: 4.1 g/dL (ref 3.6–5.1)
Alkaline phosphatase (APISO): 53 U/L (ref 37–153)
BUN: 25 mg/dL (ref 7–25)
CO2: 29 mmol/L (ref 20–32)
Calcium: 9.6 mg/dL (ref 8.6–10.4)
Chloride: 100 mmol/L (ref 98–110)
Creat: 0.76 mg/dL (ref 0.50–1.03)
Globulin: 3 g/dL (ref 1.9–3.7)
Glucose, Bld: 237 mg/dL — ABNORMAL HIGH (ref 65–99)
Potassium: 4.8 mmol/L (ref 3.5–5.3)
Sodium: 139 mmol/L (ref 135–146)
Total Bilirubin: 0.3 mg/dL (ref 0.2–1.2)
Total Protein: 7.1 g/dL (ref 6.1–8.1)
eGFR: 91 mL/min/1.73m2 (ref >=60)

## 2024-11-05 LAB — IRON,TIBC AND FERRITIN PANEL
%SAT: 16 % (ref 16–45)
Ferritin: 275 ng/mL — ABNORMAL HIGH (ref 16–232)
Iron: 47 ug/dL (ref 45–160)
TIBC: 295 ug/dL (ref 250–450)

## 2024-11-05 LAB — SEDIMENTATION RATE: Sed Rate: 46 mm/h — ABNORMAL HIGH (ref 0–30)

## 2024-11-05 LAB — ANTI-DNA ANTIBODY, DOUBLE-STRANDED: ds DNA Ab: 20 [IU]/mL — ABNORMAL HIGH

## 2024-11-05 NOTE — Telephone Encounter (Signed)
 Referrals placed

## 2024-11-20 ENCOUNTER — Telehealth: Payer: Self-pay | Admitting: *Deleted

## 2024-11-20 NOTE — Telephone Encounter (Signed)
 Patient contacted the office and left message requesting a call back.   Attempted to contact the patient and left message for patient to call the office if she needs anything from the office.

## 2024-12-10 NOTE — Therapy (Signed)
 " OUTPATIENT PHYSICAL THERAPY THORACOLUMBAR EVALUATION   Patient Name: Holly Ortiz MRN: [id number] DOB:[birth year], 59 y.o., female Today's Date: 12/12/2024  END OF SESSION:  PT End of Session - 12/12/24 0724     Visit Number 1    Number of Visits 17    Date for Recertification  02/13/25    Authorization Type [employer]ET[employer] NAP    PT Start Time 0720    PT Stop Time 0800    PT Time Calculation (min) 40 min    Activity Tolerance Patient tolerated treatment well    Behavior During Therapy [facility] for tasks assessed/performed          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-I)    Anxiety    on meds   Asthma    childhood   Cataract 2015   bilateral   Constipation    takes Miralax  daily   Depression    on meds   Diabetes mellitus    type II-on meds   Hyperlipidemia    on meds   Hypertension    on meds   Ketoacidosis 07/23/2024   Kidney stone    Lupus 2022   dx 02/2021   Seasonal allergies    Urticaria, chronic    UTI (lower urinary tract infection)    Past Surgical History:  Procedure Laterality Date   ABDOMINAL HYSTERECTOMY  08/07/2019   BREAST ENHANCEMENT SURGERY  1993   breast lift/reduction   CATARACT EXTRACTION, BILATERAL Bilateral 2014   CATARACT EXTRACTION, BILATERAL Bilateral 04/2022   [place]ONOSCOPY  2014   in [place]   EXTRACORPOREAL SHOCK WAVE LITHOTRIPSY Left 06/16/2024   Procedure: LITHOTRIPSY, ESWL;  Surgeon: Lovie Arlyss CROME, MD;  Location: [facility];  Service: Urology;  Laterality: Left;   LAPAROSCOPIC VAGINAL HYSTERECTOMY WITH SALPINGO OOPHORECTOMY Bilateral 08/07/2019   Procedure: LAPAROSCOPIC ASSISTED VAGINAL HYSTERECTOMY WITH SALPINGO OOPHORECTOMY, possible abdominal hysterectomy;  Surgeon: Curlene Agent, MD;  Location: [facility];  Service: Gynecology;  Laterality: Bilateral;  possible abdominal hysterectomy pt is diabetic and hypertensive   SOFT TISSUE MASS EXCISION Right 2016   [place]   TUBAL  LIGATION  1995   tummy tuck  1993   Patient Active Problem List   Diagnosis Date Noted   Iron  deficiency anemia, unspecified 07/24/2024   UTI (urinary tract infection) 07/23/2024   Nausea and vomiting 07/23/2024   IT band syndrome 05/01/2024   Acute respiratory failure with hypoxia (HCC) 02/24/2024   Headache 05/03/2023   Other fatigue 01/25/2023   Rash and other nonspecific skin eruption 01/25/2023   Urinary frequency 08/02/2022   Left flank pain 07/31/2022   Pain in left knee 05/03/2021   Arthralgia 02/24/2021   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   Iron  deficiency 07/07/2020   Left low back pain 01/14/2020   Anemia 12/02/2019   Lightheadedness 12/02/2019   Menorrhagia 08/07/2019   Abnormal uterine bleeding 08/07/2019   Anxiety and depression 07/03/2018   DKA (diabetic ketoacidosis) (HCC) 03/28/2018   Tachycardia 03/28/2018   Urticaria 03/28/2018   Hyperosmolar hyperglycemia 01/04/2018   AKI (acute kidney injury) 01/04/2018   Hyperglycemia 01/03/2018   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-I)    Asthma    Type 2 diabetes mellitus with complication, with long-term current use of insulin  (HCC)    Elevated cholesterol    Essential hypertension     PCP: Berneta Elsie Sayre, MD   REFERRING PROVIDER: Jeannetta Lonni ORN, MD   REFERRING  DIAG:  M54.50,G89.29 (ICD-10-CM) - Chronic bilateral low back pain without sciatica  M25.551,M25.552 (ICD-10-CM) - Bilateral hip pain  M25.561,M25.562 (ICD-10-CM) - Pain in both knees, unspecified chronicity    Hip pain, chronic low back, hip, and knee pain    Rationale for Evaluation and Treatment: Rehabilitation  THERAPY DIAG:  No diagnosis found.  ONSET DATE: Severe for aprox 1 year  SUBJECTIVE:                                                                                                                                                                                            SUBJECTIVE STATEMENT: Pt reports low back and radicular pain down both legs to her knees. Legs will heavy lke they are going to give out. It has been better for the last 3-4 weeks  PERTINENT HISTORY:  High BMI, lupus, axiety/depression  PAIN:  Are you having pain? Yes: NPRS scale: currently 2/10;  Pain location: low back and radicular pain down both legs to her knees Pain description: Throb, sharp Aggravating factors: prolonged standing and walking (40 mins to an hour); prolong sitting Relieving factors: Lie down or sit, OTC pain meds and a muscle relaxer  PRECAUTIONS: None  RED FLAGS: None   WEIGHT BEARING RESTRICTIONS: No  FALLS:  Has patient fallen in last 6 months? Yes. 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 Jeannetta  OBJECTIVE:  Note: Objective measures were completed at Evaluation unless otherwise noted.  DIAGNOSTIC FINDINGS:  None in Epic  PATIENT SURVEYS:  Modified Oswestry:  MODIFIED OSWESTRY DISABILITY SCALE  Date: *** Score  Pain intensity {ODI 1:32962}  2. Personal care (washing, dressing, etc.) {ODI 2:32963}  3. Lifting {ODI 3:32964}  4. Walking {ODI 4:32965}  5. Sitting {ODI 5:32966}  6. Standing {ODI 6:32967}  7. Sleeping {ODI 7:32968}  8. Social Life {ODI 8:32969}  9. Traveling {ODI 9:32970}  10. Employment/ Homemaking {ODI 10:32971}  Total ***/50   Interpretation of scores: Score Category Description  0-20% Minimal Disability The patient can cope with most living activities. Usually no treatment is indicated apart from advice on lifting, sitting and exercise  21-40% Moderate Disability The patient experiences more pain  and difficulty with sitting, lifting and standing. Travel and social life are more difficult and they may be disabled from work. Personal care, sexual  activity and sleeping are not grossly affected, and the patient can usually be managed by conservative means  41-60% Severe Disability Pain remains the main problem in this group, but activities of daily living are affected. These patients require a detailed investigation  61-80% Crippled Back pain impinges on all aspects of the patients life. Positive intervention is required  81-100% Bed-bound These patients are either bed-bound or exaggerating their symptoms  Bluford FORBES Zoe DELENA Karon DELENA, et al. Surgery versus conservative management of stable thoracolumbar fracture: the PRESTO feasibility RCT. [place] ([place]): [employer]; 2021 Nov. [facility] Technology Assessment, No. 25.62.) Appendix 3, Oswestry Disability Index category descriptors. Available from: Findjewelers.cz  Minimally Clinically Important Difference (MCID) = 12.8%  COGNITION: Overall cognitive status: Within functional limits for tasks assessed     SENSATION: WFL  MUSCLE LENGTH: Hamstrings: Right *** deg; Left *** deg Debby test: Right *** deg; Left *** deg  POSTURE: rounded shoulders, forward head, increased thoracic kyphosis, and anterior pelvic tilt  PALPATION: TTP to low back when in pain  LUMBAR ROM:   AROM eval  Flexion Min limited; tightness  Extension Min limited; no pain  Right lateral flexion Full; no pain  Left lateral flexion Min limited; Pressure L  Right rotation Min limited; min Pressure R  Left rotation Min limited; Pressure L   (Blank rows = not tested)  LOWER EXTREMITY ROM:     {AROM/PROM:27142}  Right eval Left eval  Hip flexion    Hip extension    Hip abduction    Hip adduction    Hip internal rotation    Hip external rotation    Knee flexion    Knee extension    Ankle dorsiflexion    Ankle  plantarflexion    Ankle inversion    Ankle eversion     (Blank rows = not tested)  LOWER EXTREMITY MMT:    MMT Right eval Left eval  Hip flexion    Hip extension    Hip abduction    Hip adduction    Hip internal rotation    Hip external rotation    Knee flexion    Knee extension    Ankle dorsiflexion    Ankle plantarflexion    Ankle inversion    Ankle eversion     (Blank rows = not tested)  LUMBAR SPECIAL TESTS:  {lumbar special test:25242}  FUNCTIONAL TESTS:  {Functional tests:24029}  GAIT: Distance walked: *** Assistive device utilized: {Assistive devices:23999} Level of assistance: {Levels of assistance:24026} Comments: ***  TREATMENT DATE:  [facility] Adult PT Treatment:                                                DATE: *** Therapeutic Exercise: *** Manual Therapy: *** Neuromuscular re-ed: *** Therapeutic Activity: *** Modalities: *** Self Care: ***  PATIENT EDUCATION:  Education details: Eval findings, POC, HEP, self care  Person educated: Patient Education method: Explanation, Demonstration, Tactile cues, Verbal cues, and Handouts Education comprehension: verbalized understanding, returned demonstration, verbal cues required, and tactile cues required  HOME EXERCISE PROGRAM: Access Code: [email or web address] URL: [email or web address] Date: 12/12/2024 Prepared by: Dasie Daft  Exercises - Seated Flexion Stretch with Swiss Ball  - 1 x daily - 7 x weekly - 1 sets - 10 reps - 5-30 hold - Supine Posterior Pelvic Tilt  - 1 x daily - 7 x weekly - 1-2 sets - 10 reps - 3 hold - Supine Bridge  - 1 x daily - 7 x weekly - 1-2 sets - 10 reps - 3 hold - Supine March  - 1 x daily - 7 x weekly - 1-2 sets - 10 reps - Hooklying Clamshell with Resistance  - 1 x daily - 7 x weekly - 1-2 sets - 10 reps - 3  hold  ASSESSMENT:  CLINICAL IMPRESSION: Patient is a 59 y.o. female who was seen today for physical therapy evaluation and treatment for  M54.50,G89.29 (ICD-10-CM) - Chronic bilateral low back pain without sciatica  M25.551,M25.552 (ICD-10-CM) - Bilateral hip pain  M25.561,M25.562 (ICD-10-CM) - Pain in both knees, unspecified chronicity   .   OBJECTIVE IMPAIRMENTS: {opptimpairments:25111}.   ACTIVITY LIMITATIONS: {activitylimitations:27494}  PARTICIPATION LIMITATIONS: {participationrestrictions:25113}  PERSONAL FACTORS: {Personal factors:25162} are also affecting patient's functional outcome.   REHAB POTENTIAL: {rehabpotential:25112}  CLINICAL DECISION MAKING: {clinical decision making:25114}  EVALUATION COMPLEXITY: {Evaluation complexity:25115}   GOALS:  SHORT TERM GOALS: Target date: 12/26/24  Pt will be Ind in an initial HEP  Baseline: Goal status: INITIAL  2.  *** Baseline:  Goal status: INITIAL  3.  *** Baseline:  Goal status: INITIAL  4.  *** Baseline:  Goal status: INITIAL  5.  *** Baseline:  Goal status: INITIAL  6.  *** Baseline:  Goal status: INITIAL  LONG TERM GOALS: Target date: ***  Pt will be Ind in a final HEP to maintain achieved LOF  Baseline: started Goal status: INITIAL  2.  *** Baseline:  Goal status: INITIAL  3.  *** Baseline:  Goal status: INITIAL  4.  *** Baseline:  Goal status: INITIAL  5.  *** Baseline:  Goal status: INITIAL  6.  *** Baseline:  Goal status: INITIAL  PLAN:  PT FREQUENCY: {rehab frequency:25116}  PT DURATION: {rehab duration:25117}  PLANNED INTERVENTIONS: {rehab planned interventions:25118::97110-Therapeutic exercises,97530- Therapeutic [id number]- Neuromuscular re-education,97535- Self Rjmz,02859- Manual therapy,Patient/Family education}.  PLAN FOR NEXT SESSION: Review FOTO; assess response to HEP; progress therex as indicated; use of modalities, manual therapy; and TPDN as  indicated.    Tyri Elmore, PT 12/12/2024, 8:09 AM  "

## 2024-12-12 ENCOUNTER — Ambulatory Visit: Attending: Internal Medicine

## 2024-12-12 ENCOUNTER — Other Ambulatory Visit: Payer: Self-pay

## 2024-12-12 DIAGNOSIS — M25561 Pain in right knee: Secondary | ICD-10-CM | POA: Diagnosis not present

## 2024-12-12 DIAGNOSIS — M5459 Other low back pain: Secondary | ICD-10-CM | POA: Insufficient documentation

## 2024-12-12 DIAGNOSIS — G8929 Other chronic pain: Secondary | ICD-10-CM | POA: Insufficient documentation

## 2024-12-12 DIAGNOSIS — M25562 Pain in left knee: Secondary | ICD-10-CM | POA: Insufficient documentation

## 2024-12-12 DIAGNOSIS — M25552 Pain in left hip: Secondary | ICD-10-CM | POA: Insufficient documentation

## 2024-12-12 DIAGNOSIS — M25551 Pain in right hip: Secondary | ICD-10-CM | POA: Diagnosis present

## 2024-12-12 DIAGNOSIS — R262 Difficulty in walking, not elsewhere classified: Secondary | ICD-10-CM | POA: Insufficient documentation

## 2024-12-12 DIAGNOSIS — M545 Low back pain, unspecified: Secondary | ICD-10-CM | POA: Insufficient documentation

## 2024-12-12 DIAGNOSIS — M6281 Muscle weakness (generalized): Secondary | ICD-10-CM | POA: Insufficient documentation

## 2024-12-12 DIAGNOSIS — R293 Abnormal posture: Secondary | ICD-10-CM | POA: Insufficient documentation

## 2024-12-16 NOTE — Therapy (Signed)
 " OUTPATIENT PHYSICAL THERAPY THORACOLUMBAR TREATMENT   Patient Name: Holly Ortiz MRN: [id number] DOB:Jul 14, 1966, 59 y.o., female Today's Date: 12/17/2024  END OF SESSION:  PT End of Session - 12/17/24 0724     Visit Number 2    Number of Visits 17    Date for Recertification  02/13/25    Authorization Type [employer] NAP    PT Start Time 0718    PT Stop Time 0758    PT Time Calculation (min) 40 min    Activity Tolerance Patient tolerated treatment well    Behavior During Therapy [facility] for tasks assessed/performed           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-I)    Anxiety    on meds   Asthma    childhood   Cataract 2015   bilateral   Constipation    takes Miralax  daily   Depression    on meds   Diabetes mellitus    type II-on meds   Hyperlipidemia    on meds   Hypertension    on meds   Ketoacidosis 07/23/2024   Kidney stone    Lupus 2022   dx 02/2021   Seasonal allergies    Urticaria, chronic    UTI (lower urinary tract infection)    Past Surgical History:  Procedure Laterality Date   ABDOMINAL HYSTERECTOMY  08/07/2019   BREAST ENHANCEMENT SURGERY  1993   breast lift/reduction   CATARACT EXTRACTION, BILATERAL Bilateral 2014   CATARACT EXTRACTION, BILATERAL Bilateral 04/2022   [place]ONOSCOPY  2014   in [place]   EXTRACORPOREAL SHOCK WAVE LITHOTRIPSY Left 06/16/2024   Procedure: LITHOTRIPSY, ESWL;  Surgeon: Lovie Arlyss CROME, MD;  Location: [facility];  Service: Urology;  Laterality: Left;   LAPAROSCOPIC VAGINAL HYSTERECTOMY WITH SALPINGO OOPHORECTOMY Bilateral 08/07/2019   Procedure: LAPAROSCOPIC ASSISTED VAGINAL HYSTERECTOMY WITH SALPINGO OOPHORECTOMY, possible abdominal hysterectomy;  Surgeon: Curlene Agent, MD;  Location: [facility] OR;  Service: Gynecology;  Laterality: Bilateral;  possible abdominal hysterectomy pt is diabetic and hypertensive   SOFT TISSUE MASS EXCISION Right 2016   [place]   TUBAL  LIGATION  1995   tummy tuck  1993   Patient Active Problem List   Diagnosis Date Noted   Iron  deficiency anemia, unspecified 07/24/2024   UTI (urinary tract infection) 07/23/2024   Nausea and vomiting 07/23/2024   IT band syndrome 05/01/2024   Acute respiratory failure with hypoxia (HCC) 02/24/2024   Headache 05/03/2023   Other fatigue 01/25/2023   Rash and other nonspecific skin eruption 01/25/2023   Urinary frequency 08/02/2022   Left flank pain 07/31/2022   Pain in left knee 05/03/2021   Arthralgia 02/24/2021   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   Iron  deficiency 07/07/2020   Left low back pain 01/14/2020   Anemia 12/02/2019   Lightheadedness 12/02/2019   Menorrhagia 08/07/2019   Abnormal uterine bleeding 08/07/2019   Anxiety and depression 07/03/2018   DKA (diabetic ketoacidosis) (HCC) 03/28/2018   Tachycardia 03/28/2018   Urticaria 03/28/2018   Hyperosmolar hyperglycemia 01/04/2018   AKI (acute kidney injury) 01/04/2018   Hyperglycemia 01/03/2018   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-I)    Asthma    Type 2 diabetes mellitus with complication, with long-term current use of insulin  (HCC)    Elevated cholesterol    Essential hypertension     PCP: Berneta Elsie Sayre, MD   REFERRING PROVIDER: Jeannetta Lonni ORN, MD  REFERRING DIAG:  M54.50,G89.29 (ICD-10-CM) - Chronic bilateral low back pain without sciatica  M25.551,M25.552 (ICD-10-CM) - Bilateral hip pain  M25.561,M25.562 (ICD-10-CM) - Pain in both knees, unspecified chronicity    Hip pain, chronic low back, hip, and knee pain    Rationale for Evaluation and Treatment: Rehabilitation  THERAPY DIAG:  Other low back pain  Pain of both hip joints  Muscle weakness (generalized)  Difficulty in walking, not elsewhere classified  Abnormal posture  ONSET DATE: Chronic with being more severe for aprox 1 year  SUBJECTIVE:                                                                                                                                                                                            SUBJECTIVE STATEMENT: Pt reports she is having a more pain. Pt states she has done a little of her HEP with the band exs.  EVAL: Pt reports low back and radicular pain down both legs to her hips and knees which occurs primarily with prolonged standing and walking. When this happens her legs will become heavy like they are going to give out. She notes her symptoms have been better for the last 3-4 weeks, but she is not sure why.  PERTINENT HISTORY:  High BMI, lupus, axiety/depression  PAIN:  Are you having pain? Yes: NPRS scale: currently 4/10; 9/10 c prolonged standing and walking Pain location: low back and radicular pain down both legs to her hips and  knees Pain description: Throb, sharp Aggravating factors: prolonged standing and walking (40 mins to an hour); prolong sitting Relieving factors: Lie down or sit, OTC pain meds and a muscle relaxer  PRECAUTIONS: None  RED FLAGS: None   WEIGHT BEARING RESTRICTIONS: No  FALLS:  Has patient fallen in last 6 months? Yes. 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 Jeannetta  OBJECTIVE:  Note: Objective measures were completed at Evaluation unless otherwise noted.  DIAGNOSTIC FINDINGS:  None in Epic  PATIENT SURVEYS:  Modified Oswestry: 23/50=46%  Interpretation of scores: Score Category Description  0-20% Minimal Disability The patient can cope with most living activities. Usually no treatment is indicated apart from advice on lifting, sitting and exercise  21-40%  Moderate Disability The patient experiences more pain and difficulty with sitting, lifting and standing. Travel and social  life are more difficult and they may be disabled from work. Personal care, sexual activity and sleeping are not grossly affected, and the patient can usually be managed by conservative means  41-60% Severe Disability Pain remains the main problem in this group, but activities of daily living are affected. These patients require a detailed investigation  61-80% Crippled Back pain impinges on all aspects of the patients life. Positive intervention is required  81-100% Bed-bound These patients are either bed-bound or exaggerating their symptoms    Minimally Clinically Important Difference (MCID) = 12.8%  COGNITION: Overall cognitive status: Within functional limits for tasks assessed     SENSATION: WFL  MUSCLE LENGTH: Hamstrings: Right WNLs deg; Left WNLs deg Debby test: Right WNLs deg; Left WNLs deg  POSTURE: rounded shoulders, forward head, increased thoracic kyphosis, and anterior pelvic tilt  PALPATION: TTP to low back when in pain  LUMBAR ROM:   AROM eval  Flexion Min limited; tightness  Extension Min limited; no pain  Right lateral flexion Full; no pain  Left lateral flexion Min limited; Pressure L  Right rotation Min limited; min Pressure R  Left rotation Min limited; Pressure L   (Blank rows = not tested)  LOWER EXTREMITY ROM:     WFLs Active  Right eval Left eval  Hip flexion    Hip extension    Hip abduction    Hip adduction    Hip internal rotation    Hip external rotation    Knee flexion    Knee extension    Ankle dorsiflexion    Ankle plantarflexion    Ankle inversion    Ankle eversion     (Blank rows = not tested)  LOWER EXTREMITY MMT:    Weak core MMT Right eval Left eval  Hip flexion 4 4  Hip extension 3 3  Hip abduction 4 4  Hip adduction    Hip internal rotation    Hip external rotation 4 4  Knee flexion 5 5  Knee  extension 5 5  Ankle dorsiflexion    Ankle plantarflexion    Ankle inversion    Ankle eversion     (Blank rows = not tested)  LUMBAR SPECIAL TESTS:  Straight leg raise test: Negative and Slump test: Negative  FUNCTIONAL TESTS:  5 times sit to stand: TBA  GAIT: Distance walked: 200' Assistive device utilized: None Level of assistance: Complete Independence Comments: WNLs  TREATMENT DATE:  [facility] Adult PT Treatment:                                                DATE: 12/17/24 Therapeutic Exercise: Nustep 5 mins L3 UE/LE Seated Flexion Stretch with Swiss Ball FWD and lat Partial curl up x10 3 Supine Posterior Pelvic Tilt  x10 3 Supine March  2x10  Supine Bridge 2x10 3 Hooklying Clamshell 2x10 GTB  Self Care: Purpose of strength and flexibility exs to address low back function and pain  [facility] Adult PT Treatment:                                                DATE: 12/12/24 Therapeutic Exercise: Developed, instructed in, and pt completed therex as noted in HEP  Self Care: Eval  findings and the purpose of PT                                                                                                                                 PATIENT EDUCATION:  Education details: Eval findings, POC, HEP, self care  Person educated: Patient Education method: Explanation, Demonstration, Tactile cues, Verbal cues, and Handouts Education comprehension: verbalized understanding, returned demonstration, verbal cues required, and tactile cues required  HOME EXERCISE PROGRAM: Access Code: [email or web address] URL: [email or web address] Date: 12/17/2024 Prepared by: Dasie Daft  Exercises - Seated Flexion Stretch with Swiss Ball  - 1 x daily - 7 x weekly - 1 sets - 10 reps - 5-30 hold - Supine Posterior Pelvic Tilt  - 1 x daily - 7 x weekly - 1-2 sets - 10 reps - 3 hold - Curl Up with Arms Crossed  - 1 x daily - 7 x weekly - 1-2 sets - 10 reps - 3 hold - Supine March  - 1 x daily - 7 x  weekly - 1-2 sets - 10 reps - Supine Bridge  - 1 x daily - 7 x weekly - 1-2 sets - 10 reps - 3 hold - Hooklying Clamshell with Resistance  - 1 x daily - 7 x weekly - 1-2 sets - 10 reps - 3 hold - Curl Up with Arms Crossed  - 1 x daily - 7 x weekly - 3 sets - 10 reps  ASSESSMENT:  CLINICAL IMPRESSION: PT was completed for lumbopelvic flexibility and strengthening.Pt tolerated prescribed exs without adverse effects. Pt completed exs with proper technique. Pt was encouraged to complete her exs daily. Pt will continue to benefit from skilled PT to address impairments for improved back function with minimized pain.   EVAL: Patient is a 59 y.o. female who was seen today for physical therapy evaluation and treatment for  M54.50,G89.29 (ICD-10-CM) - Chronic bilateral low back pain without sciatica  M25.551,M25.552 (ICD-10-CM) - Bilateral hip pain  M25.561,M25.562 (ICD-10-CM) - Pain in both knees, unspecified chronicity  Pt presents with postural deficits, a weak core and hips, and low back pain with intermittent low back and radicular pain with prolonged standing and walking which is relived by rest. A HEP was initiated. Pt will benefit from skilled 2w8 PT to address impairments to optimize back function with less pain.   OBJECTIVE IMPAIRMENTS: decreased activity tolerance, decreased strength, postural dysfunction, pain, and high BMI.   ACTIVITY LIMITATIONS: carrying, lifting, sitting, and locomotion level  PARTICIPATION LIMITATIONS: meal prep, cleaning, laundry, community activity, and occupation  PERSONAL FACTORS: Fitness, Past/current experiences, Time since onset of injury/illness/exacerbation, and 3+ comorbidities:   High BMI, lupus, axiety/depression are also affecting patient's functional outcome.   REHAB POTENTIAL: Good  CLINICAL DECISION MAKING: Evolving/moderate complexity  EVALUATION COMPLEXITY: Moderate   GOALS:  SHORT TERM GOALS: Target date: 12/26/24  Pt will be Ind in an  initial HEP  Baseline: started Goal status: INITIAL  2.  Pt will report 25% or greater decrease in pain for improved standing and walking tolerance and QOL Baseline: 9/10 Goal status: INITIAL  LONG TERM GOALS: Target date: 02/13/25  Pt will be Ind in a final HEP to maintain achieved LOF  Baseline: started Goal status: INITIAL  2.  Pt will report 75% or greater decrease in pain for improved standing and walking tolerance and QOL Baseline: 9/10 Goal status: INITIAL  3.  Pt will demonstrate 4+ or greater hip strength for improved back support Baseline:  Goal status: INITIAL  4.  Improve 5xSTS by MCID of 5 as indication of improved functional mobility  Baseline: TBA Goal status: INITIAL  5.  Pt's Mod Oswestry score will improve by the MCID to 33% as indication of improved function  Baseline: 46% Goal status: INITIAL PLAN:  PT FREQUENCY: 2x/week  PT DURATION: 2 weeks  PLANNED INTERVENTIONS: 97164- PT Re-evaluation, 97110-Therapeutic exercises, 97530- Therapeutic activity, 97112- Neuromuscular re-education, 97535- Self Care, 02859- Manual therapy, [id number]- Aquatic Therapy, [id number]- Electrical stimulation (unattended), 20560 (1-2 muscles), 20561 (3+ muscles)- Dry Needling, Patient/Family education, Joint mobilization, Spinal mobilization, Cryotherapy, and Moist heat.  PLAN FOR NEXT SESSION: Assess response to HEP; progress therex as indicated; use of modalities, manual therapy; and TPDN as indicated.   Teliah Buffalo MS, PT 12/17/2024 8:03 AM  "

## 2024-12-17 ENCOUNTER — Ambulatory Visit

## 2024-12-17 ENCOUNTER — Ambulatory Visit: Admitting: Endocrinology

## 2024-12-17 DIAGNOSIS — R262 Difficulty in walking, not elsewhere classified: Secondary | ICD-10-CM

## 2024-12-17 DIAGNOSIS — M5459 Other low back pain: Secondary | ICD-10-CM

## 2024-12-17 DIAGNOSIS — R293 Abnormal posture: Secondary | ICD-10-CM

## 2024-12-17 DIAGNOSIS — M6281 Muscle weakness (generalized): Secondary | ICD-10-CM

## 2024-12-17 DIAGNOSIS — M25551 Pain in right hip: Secondary | ICD-10-CM

## 2024-12-20 ENCOUNTER — Other Ambulatory Visit: Payer: Self-pay | Admitting: Endocrinology

## 2024-12-20 DIAGNOSIS — E1165 Type 2 diabetes mellitus with hyperglycemia: Secondary | ICD-10-CM

## 2024-12-22 NOTE — Therapy (Signed)
 " OUTPATIENT PHYSICAL THERAPY THORACOLUMBAR TREATMENT   Patient Name: Holly Ortiz MRN: [id number] DOB:11/15/1966, 59 y.o., female Today's Date: 12/23/2024  END OF SESSION:  PT End of Session - 12/23/24 0727     Visit Number 3    Number of Visits 17    Date for Recertification  02/13/25    Authorization Type [employer]ET[employer] NAP    PT Start Time 0717    PT Stop Time 0800    PT Time Calculation (min) 43 min    Activity Tolerance Patient tolerated treatment well    Behavior During Therapy [facility] for tasks assessed/performed            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-I)    Anxiety    on meds   Asthma    childhood   Cataract 2015   bilateral   Constipation    takes Miralax  daily   Depression    on meds   Diabetes mellitus    type II-on meds   Hyperlipidemia    on meds   Hypertension    on meds   Ketoacidosis 07/23/2024   Kidney stone    Lupus 2022   dx 02/2021   Seasonal allergies    Urticaria, chronic    UTI (lower urinary tract infection)    Past Surgical History:  Procedure Laterality Date   ABDOMINAL HYSTERECTOMY  08/07/2019   BREAST ENHANCEMENT SURGERY  1993   breast lift/reduction   CATARACT EXTRACTION, BILATERAL Bilateral 2014   CATARACT EXTRACTION, BILATERAL Bilateral 04/2022   [place]ONOSCOPY  2014   in [place]   EXTRACORPOREAL SHOCK WAVE LITHOTRIPSY Left 06/16/2024   Procedure: LITHOTRIPSY, ESWL;  Surgeon: Lovie Arlyss CROME, MD;  Location: [facility];  Service: Urology;  Laterality: Left;   LAPAROSCOPIC VAGINAL HYSTERECTOMY WITH SALPINGO OOPHORECTOMY Bilateral 08/07/2019   Procedure: LAPAROSCOPIC ASSISTED VAGINAL HYSTERECTOMY WITH SALPINGO OOPHORECTOMY, possible abdominal hysterectomy;  Surgeon: Curlene Agent, MD;  Location: [facility] OR;  Service: Gynecology;  Laterality: Bilateral;  possible abdominal hysterectomy pt is diabetic and hypertensive   SOFT TISSUE MASS EXCISION Right 2016   [place]    TUBAL LIGATION  1995   tummy tuck  1993   Patient Active Problem List   Diagnosis Date Noted   Iron  deficiency anemia, unspecified 07/24/2024   UTI (urinary tract infection) 07/23/2024   Nausea and vomiting 07/23/2024   IT band syndrome 05/01/2024   Acute respiratory failure with hypoxia (HCC) 02/24/2024   Headache 05/03/2023   Other fatigue 01/25/2023   Rash and other nonspecific skin eruption 01/25/2023   Urinary frequency 08/02/2022   Left flank pain 07/31/2022   Pain in left knee 05/03/2021   Arthralgia 02/24/2021   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   Iron  deficiency 07/07/2020   Left low back pain 01/14/2020   Anemia 12/02/2019   Lightheadedness 12/02/2019   Menorrhagia 08/07/2019   Abnormal uterine bleeding 08/07/2019   Anxiety and depression 07/03/2018   DKA (diabetic ketoacidosis) (HCC) 03/28/2018   Tachycardia 03/28/2018   Urticaria 03/28/2018   Hyperosmolar hyperglycemia 01/04/2018   AKI (acute kidney injury) 01/04/2018   Hyperglycemia 01/03/2018   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-I)    Asthma    Type 2 diabetes mellitus with complication, with long-term current use of insulin  (HCC)    Elevated cholesterol    Essential hypertension     PCP: Berneta Elsie Sayre, MD   REFERRING PROVIDER: Jeannetta Lonni ORN, MD  REFERRING DIAG:  M54.50,G89.29 (ICD-10-CM) - Chronic bilateral low back pain without sciatica  M25.551,M25.552 (ICD-10-CM) - Bilateral hip pain  M25.561,M25.562 (ICD-10-CM) - Pain in both knees, unspecified chronicity    Hip pain, chronic low back, hip, and knee pain    Rationale for Evaluation and Treatment: Rehabilitation  THERAPY DIAG:  Other low back pain  Pain of both hip joints  Muscle weakness (generalized)  Difficulty in walking, not elsewhere classified  Abnormal posture  ONSET DATE: Chronic with being more severe for aprox 1 year  SUBJECTIVE:                                                                                                                                                                                            SUBJECTIVE STATEMENT: Pt reports her low back pain has been generally low. She states limited completion of her HEP.  EVAL: Pt reports low back and radicular pain down both legs to her hips and knees which occurs primarily with prolonged standing and walking. When this happens her legs will become heavy like they are going to give out. She notes her symptoms have been better for the last 3-4 weeks, but she is not sure why.  PERTINENT HISTORY:  High BMI, lupus, axiety/depression  PAIN:  Are you having pain? Yes: NPRS scale: currently 3/10; 9/10 c prolonged standing and walking Pain location: low back and radicular pain down both legs to her hips and  knees Pain description: Throb, sharp Aggravating factors: prolonged standing and walking (40 mins to an hour); prolong sitting Relieving factors: Lie down or sit, OTC pain meds and a muscle relaxer  PRECAUTIONS: None  RED FLAGS: None   WEIGHT BEARING RESTRICTIONS: No  FALLS:  Has patient fallen in last 6 months? Yes. 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 Jeannetta  OBJECTIVE:  Note: Objective measures were completed at Evaluation unless otherwise noted.  DIAGNOSTIC FINDINGS:  None in Epic  PATIENT SURVEYS:  Modified Oswestry: 23/50=46%  Interpretation of scores: Score Category Description  0-20% Minimal Disability The patient can cope with most living activities. Usually no treatment is indicated apart from advice on lifting, sitting and exercise  21-40%  Moderate Disability The patient experiences more pain and difficulty with sitting, lifting and standing. Travel and social life are more difficult and  they may be disabled from work. Personal care, sexual activity and sleeping are not grossly affected, and the patient can usually be managed by conservative means  41-60% Severe Disability Pain remains the main problem in this group, but activities of daily living are affected. These patients require a detailed investigation  61-80% Crippled Back pain impinges on all aspects of the patients life. Positive intervention is required  81-100% Bed-bound These patients are either bed-bound or exaggerating their symptoms    Minimally Clinically Important Difference (MCID) = 12.8%  COGNITION: Overall cognitive status: Within functional limits for tasks assessed     SENSATION: WFL  MUSCLE LENGTH: Hamstrings: Right WNLs deg; Left WNLs deg Debby test: Right WNLs deg; Left WNLs deg  POSTURE: rounded shoulders, forward head, increased thoracic kyphosis, and anterior pelvic tilt  PALPATION: TTP to low back when in pain  LUMBAR ROM:   AROM eval  Flexion Min limited; tightness  Extension Min limited; no pain  Right lateral flexion Full; no pain  Left lateral flexion Min limited; Pressure L  Right rotation Min limited; min Pressure R  Left rotation Min limited; Pressure L   (Blank rows = not tested)  LOWER EXTREMITY ROM:     WFLs Active  Right eval Left eval  Hip flexion    Hip extension    Hip abduction    Hip adduction    Hip internal rotation    Hip external rotation    Knee flexion    Knee extension    Ankle dorsiflexion    Ankle plantarflexion    Ankle inversion    Ankle eversion     (Blank rows = not tested)  LOWER EXTREMITY MMT:    Weak core MMT Right eval Left eval  Hip flexion 4 4  Hip extension 3 3  Hip abduction 4 4  Hip adduction    Hip internal rotation    Hip external rotation 4 4  Knee flexion 5 5  Knee  extension 5 5  Ankle dorsiflexion    Ankle plantarflexion    Ankle inversion    Ankle eversion     (Blank rows = not tested)  LUMBAR SPECIAL TESTS:  Straight leg raise test: Negative and Slump test: Negative  FUNCTIONAL TESTS:  5 times sit to stand: TBA  GAIT: Distance walked: 200' Assistive device utilized: None Level of assistance: Complete Independence Comments: WNLs  TREATMENT DATE:  [facility] Adult PT Treatment:                                                DATE: 12/23/24 Therapeutic Exercise: Nustep 5 mins L3 UE/LE Seated Flexion Stretch with Swiss Ball FWD and lat LTR x10 5 Open books x8 5 Partial curl up x10 3 90/90 Ab bracing x2, stopped due to low back pain Supine Posterior Pelvic Tilt  x10 3 Supine March  2x10  Supine Bridge x10 3 Hooklying Clamshell x10 GTB  Self Care: Proper technique for supine t/f sitting out of bed Use of a pillow under knees when sleeping supine for comfort Manual Therapy: Unilateral LAD each LE for lumbar traction  [facility] Adult PT Treatment:  DATE: 12/17/24 Therapeutic Exercise: Nustep 5 mins L3 UE/LE Seated Flexion Stretch with Swiss Ball FWD and lat Partial curl up x10 3 Supine Posterior Pelvic Tilt  x10 3 Supine March  2x10  Supine Bridge 2x10 3 Hooklying Clamshell 2x10 GTB  Self Care: Purpose of strength and flexibility exs to address low back function and pain  [facility] Adult PT Treatment:                                                DATE: 12/12/24 Therapeutic Exercise: Developed, instructed in, and pt completed therex as noted in HEP  Self Care: Eval findings and the purpose of PT                                                                                                                                 PATIENT EDUCATION:  Education details: Eval findings, POC, HEP, self care  Person educated: Patient Education method: Explanation, Demonstration, Tactile cues, Verbal cues,  and Handouts Education comprehension: verbalized understanding, returned demonstration, verbal cues required, and tactile cues required  HOME EXERCISE PROGRAM: Access Code: [email or web address] URL: [email or web address] Date: 12/17/2024 Prepared by: Dasie Daft  Exercises - Seated Flexion Stretch with Swiss Ball  - 1 x daily - 7 x weekly - 1 sets - 10 reps - 5-30 hold - Supine Posterior Pelvic Tilt  - 1 x daily - 7 x weekly - 1-2 sets - 10 reps - 3 hold - Curl Up with Arms Crossed  - 1 x daily - 7 x weekly - 1-2 sets - 10 reps - 3 hold - Supine March  - 1 x daily - 7 x weekly - 1-2 sets - 10 reps - Supine Bridge  - 1 x daily - 7 x weekly - 1-2 sets - 10 reps - 3 hold - Hooklying Clamshell with Resistance  - 1 x daily - 7 x weekly - 1-2 sets - 10 reps - 3 hold - Curl Up with Arms Crossed  - 1 x daily - 7 x weekly - 3 sets - 10 reps  ASSESSMENT:  CLINICAL IMPRESSION:  PT today focused more on lumbar flexibility per exs and with unilateral LAD for lumbar traction. PT Ed was provided as above. Lumbopelvic strengthening exs were also completed. Pt tolerated PT today without adverse effects. Encouraged pt for daily completion of her HEP for best address flexibility and strengthening. Pt voiced understanding.  EVAL: Patient is a 59 y.o. female who was seen today for physical therapy evaluation and treatment for  M54.50,G89.29 (ICD-10-CM) - Chronic bilateral low back pain without sciatica  M25.551,M25.552 (ICD-10-CM) - Bilateral hip pain  M25.561,M25.562 (ICD-10-CM) - Pain in both knees, unspecified chronicity  Pt presents with postural deficits, a weak core and hips, and low back pain with intermittent low back and  radicular pain with prolonged standing and walking which is relived by rest. A HEP was initiated. Pt will benefit from skilled 2w8 PT to address impairments to optimize back function with less pain.   OBJECTIVE IMPAIRMENTS: decreased activity tolerance, decreased strength,  postural dysfunction, pain, and high BMI.   ACTIVITY LIMITATIONS: carrying, lifting, sitting, and locomotion level  PARTICIPATION LIMITATIONS: meal prep, cleaning, laundry, community activity, and occupation  PERSONAL FACTORS: Fitness, Past/current experiences, Time since onset of injury/illness/exacerbation, and 3+ comorbidities:   High BMI, lupus, axiety/depression are also affecting patient's functional outcome.   REHAB POTENTIAL: Good  CLINICAL DECISION MAKING: Evolving/moderate complexity  EVALUATION COMPLEXITY: Moderate   GOALS:  SHORT TERM GOALS: Target date: 12/26/24  Pt will be Ind in an initial HEP  Baseline: started Goal status: Ongoing  2.  Pt will report 25% or greater decrease in pain for improved standing and walking tolerance and QOL Baseline: 9/10 Goal status: INITIAL  LONG TERM GOALS: Target date: 02/13/25  Pt will be Ind in a final HEP to maintain achieved LOF  Baseline: started Goal status: INITIAL  2.  Pt will report 75% or greater decrease in pain for improved standing and walking tolerance and QOL Baseline: 9/10 Goal status: INITIAL  3.  Pt will demonstrate 4+ or greater hip strength for improved back support Baseline:  Goal status: INITIAL  4.  Improve 5xSTS by MCID of 5 as indication of improved functional mobility  Baseline: TBA Goal status: INITIAL  5.  Pt's Mod Oswestry score will improve by the MCID to 33% as indication of improved function  Baseline: 46% Goal status: INITIAL PLAN:  PT FREQUENCY: 2x/week  PT DURATION: 2 weeks  PLANNED INTERVENTIONS: 97164- PT Re-evaluation, 97110-Therapeutic exercises, 97530- Therapeutic activity, 97112- Neuromuscular re-education, 97535- Self Care, [id number]- Manual therapy, [id number]- Aquatic Therapy, [id number]- Electrical stimulation (unattended), 20560 (1-2 muscles), 20561 (3+ muscles)- Dry Needling, Patient/Family education, Joint mobilization, Spinal mobilization, Cryotherapy, and Moist heat.  PLAN FOR  NEXT SESSION: Assess response to HEP; progress therex as indicated; use of modalities, manual therapy; and TPDN as indicated.   Cayman Brogden MS, PT 12/23/24 9:17 AM  "

## 2024-12-23 ENCOUNTER — Ambulatory Visit

## 2024-12-23 ENCOUNTER — Other Ambulatory Visit: Payer: Self-pay | Admitting: Family Medicine

## 2024-12-23 DIAGNOSIS — R293 Abnormal posture: Secondary | ICD-10-CM

## 2024-12-23 DIAGNOSIS — M25551 Pain in right hip: Secondary | ICD-10-CM

## 2024-12-23 DIAGNOSIS — L509 Urticaria, unspecified: Secondary | ICD-10-CM

## 2024-12-23 DIAGNOSIS — M6281 Muscle weakness (generalized): Secondary | ICD-10-CM

## 2024-12-23 DIAGNOSIS — M5459 Other low back pain: Secondary | ICD-10-CM

## 2024-12-23 DIAGNOSIS — R262 Difficulty in walking, not elsewhere classified: Secondary | ICD-10-CM

## 2024-12-25 ENCOUNTER — Ambulatory Visit: Admitting: Physical Therapy

## 2024-12-25 ENCOUNTER — Encounter: Payer: Self-pay | Admitting: Physical Therapy

## 2024-12-25 DIAGNOSIS — M5459 Other low back pain: Secondary | ICD-10-CM | POA: Diagnosis not present

## 2024-12-25 DIAGNOSIS — M6281 Muscle weakness (generalized): Secondary | ICD-10-CM

## 2024-12-25 NOTE — Therapy (Signed)
 " OUTPATIENT PHYSICAL THERAPY THORACOLUMBAR TREATMENT   Patient Name: Holly Ortiz MRN: [id number] DOB:August 17, 1966, 59 y.o., female Today's Date: 12/25/2024  END OF SESSION:  PT End of Session - 12/25/24 0729     Visit Number 4    Number of Visits 17    Date for Recertification  02/13/25    Authorization Type [employer] NAP    PT Start Time 0730   pt late   PT Stop Time 0758    PT Time Calculation (min) 28 min            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-I)    Anxiety    on meds   Asthma    childhood   Cataract 2015   bilateral   Constipation    takes Miralax  daily   Depression    on meds   Diabetes mellitus    type II-on meds   Hyperlipidemia    on meds   Hypertension    on meds   Ketoacidosis 07/23/2024   Kidney stone    Lupus 2022   dx 02/2021   Seasonal allergies    Urticaria, chronic    UTI (lower urinary tract infection)    Past Surgical History:  Procedure Laterality Date   ABDOMINAL HYSTERECTOMY  08/07/2019   BREAST ENHANCEMENT SURGERY  1993   breast lift/reduction   CATARACT EXTRACTION, BILATERAL Bilateral 2014   CATARACT EXTRACTION, BILATERAL Bilateral 04/2022   COLONOSCOPY  2014   in [place]   EXTRACORPOREAL SHOCK WAVE LITHOTRIPSY Left 06/16/2024   Procedure: LITHOTRIPSY, ESWL;  Surgeon: Lovie Arlyss CROME, MD;  Location: [facility];  Service: Urology;  Laterality: Left;   LAPAROSCOPIC VAGINAL HYSTERECTOMY WITH SALPINGO OOPHORECTOMY Bilateral 08/07/2019   Procedure: LAPAROSCOPIC ASSISTED VAGINAL HYSTERECTOMY WITH SALPINGO OOPHORECTOMY, possible abdominal hysterectomy;  Surgeon: Curlene Agent, MD;  Location: [facility];  Service: Gynecology;  Laterality: Bilateral;  possible abdominal hysterectomy pt is diabetic and hypertensive   SOFT TISSUE MASS EXCISION Right 2016   [place]   TUBAL LIGATION  1995   tummy tuck  1993   Patient Active Problem List   Diagnosis Date Noted   Iron   deficiency anemia, unspecified 07/24/2024   UTI (urinary tract infection) 07/23/2024   Nausea and vomiting 07/23/2024   IT band syndrome 05/01/2024   Acute respiratory failure with hypoxia (HCC) 02/24/2024   Headache 05/03/2023   Other fatigue 01/25/2023   Rash and other nonspecific skin eruption 01/25/2023   Urinary frequency 08/02/2022   Left flank pain 07/31/2022   Pain in left knee 05/03/2021   Arthralgia 02/24/2021   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   Iron  deficiency 07/07/2020   Left low back pain 01/14/2020   Anemia 12/02/2019   Lightheadedness 12/02/2019   Menorrhagia 08/07/2019   Abnormal uterine bleeding 08/07/2019   Anxiety and depression 07/03/2018   DKA (diabetic ketoacidosis) (HCC) 03/28/2018   Tachycardia 03/28/2018   Urticaria 03/28/2018   Hyperosmolar hyperglycemia 01/04/2018   AKI (acute kidney injury) 01/04/2018   Hyperglycemia 01/03/2018   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-I)    Asthma    Type 2 diabetes mellitus with complication, with long-term current use of insulin  (HCC)    Elevated cholesterol    Essential hypertension     PCP: Berneta Elsie Sayre, MD   REFERRING PROVIDER: Jeannetta Lonni ORN, MD   REFERRING DIAG:  M54.50,G89.29 (ICD-10-CM) - Chronic bilateral low back pain without sciatica  M25.551,M25.552 (  ICD-10-CM) - Bilateral hip pain  M25.561,M25.562 (ICD-10-CM) - Pain in both knees, unspecified chronicity    Hip pain, chronic low back, hip, and knee pain    Rationale for Evaluation and Treatment: Rehabilitation  THERAPY DIAG:  Other low back pain  Muscle weakness (generalized)  ONSET DATE: Chronic with being more severe for aprox 1 year  SUBJECTIVE:                                                                                                                                                                                           SUBJECTIVE STATEMENT: Pt reports that she is  in pain today. 5/10.   EVAL: Pt reports low back and radicular pain down both legs to her hips and knees which occurs primarily with prolonged standing and walking. When this happens her legs will become heavy like they are going to give out. She notes her symptoms have been better for the last 3-4 weeks, but she is not sure why.  PERTINENT HISTORY:  High BMI, lupus, axiety/depression  PAIN:  Are you having pain? Yes: NPRS scale: currently 5/10; 9/10 c prolonged standing and walking Pain location: low back and radicular pain down both legs to her hips and  knees Pain description: Throb, sharp Aggravating factors: prolonged standing and walking (40 mins to an hour); prolong sitting Relieving factors: Lie down or sit, OTC pain meds and a muscle relaxer  PRECAUTIONS: None  RED FLAGS: None   WEIGHT BEARING RESTRICTIONS: No  FALLS:  Has patient fallen in last 6 months? Yes. 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 Jeannetta  OBJECTIVE:  Note: Objective measures were completed at Evaluation unless otherwise noted.  DIAGNOSTIC FINDINGS:  None in Epic  PATIENT SURVEYS:  Modified Oswestry: 23/50=46%  Interpretation of scores: Score Category Description  0-20% Minimal Disability The patient can cope with most living activities. Usually no treatment is indicated apart from advice on lifting, sitting and exercise  21-40% Moderate Disability The patient experiences more pain and difficulty with sitting, lifting and standing. Travel and social life are more difficult and they may be disabled from work. Personal care, sexual activity and sleeping are not grossly  affected, and the patient can usually be managed by conservative means  41-60% Severe Disability Pain remains the main problem in this group,  but activities of daily living are affected. These patients require a detailed investigation  61-80% Crippled Back pain impinges on all aspects of the patients life. Positive intervention is required  81-100% Bed-bound These patients are either bed-bound or exaggerating their symptoms    Minimally Clinically Important Difference (MCID) = 12.8%  COGNITION: Overall cognitive status: Within functional limits for tasks assessed     SENSATION: WFL  MUSCLE LENGTH: Hamstrings: Right WNLs deg; Left WNLs deg Debby test: Right WNLs deg; Left WNLs deg  POSTURE: rounded shoulders, forward head, increased thoracic kyphosis, and anterior pelvic tilt  PALPATION: TTP to low back when in pain  LUMBAR ROM:   AROM eval  Flexion Min limited; tightness  Extension Min limited; no pain  Right lateral flexion Full; no pain  Left lateral flexion Min limited; Pressure L  Right rotation Min limited; min Pressure R  Left rotation Min limited; Pressure L   (Blank rows = not tested)  LOWER EXTREMITY ROM:     WFLs Active  Right eval Left eval  Hip flexion    Hip extension    Hip abduction    Hip adduction    Hip internal rotation    Hip external rotation    Knee flexion    Knee extension    Ankle dorsiflexion    Ankle plantarflexion    Ankle inversion    Ankle eversion     (Blank rows = not tested)  LOWER EXTREMITY MMT:    Weak core MMT Right eval Left eval  Hip flexion 4 4  Hip extension 3 3  Hip abduction 4 4  Hip adduction    Hip internal rotation    Hip external rotation 4 4  Knee flexion 5 5  Knee extension 5 5  Ankle dorsiflexion    Ankle plantarflexion    Ankle inversion    Ankle eversion     (Blank rows = not tested)  LUMBAR SPECIAL TESTS:  Straight leg raise test: Negative and Slump test: Negative  FUNCTIONAL TESTS:  5  times sit to stand: TBA  GAIT: Distance walked: 200' Assistive device utilized: None Level of assistance: Complete Independence Comments: WNLs  TREATMENT DATE:  [facility] Adult PT Treatment:                                                DATE: 12/25/24 Therapeutic Exercise: Nustep 5 min Seated lumbar flexion with laterals using ball LTR  PPT / ab press with medium exercise ball  Supine marching  Bridge 3 sec x 10  Blue band clam 3 sec x 10  Bridge with Blue band x 10 , 3 sec   [facility] Adult PT Treatment:                                                DATE: 12/23/24 Therapeutic Exercise: Nustep 5 mins L3 UE/LE Seated Flexion Stretch with Swiss Ball FWD and lat LTR x10 5 Open books x8 5 Partial curl up x10 3 90/90 Ab bracing x2, stopped due to low back pain Supine Posterior Pelvic Tilt  x10 3 Supine March  2x10  Supine Bridge x10 3 Hooklying Clamshell x10 GTB  Self Care: Proper technique for supine t/f sitting  out of bed Use of a pillow under knees when sleeping supine for comfort Manual Therapy: Unilateral LAD each LE for lumbar traction  [facility] Adult PT Treatment:                                                DATE: 12/17/24 Therapeutic Exercise: Nustep 5 mins L3 UE/LE Seated Flexion Stretch with Swiss Ball FWD and lat Partial curl up x10 3 Supine Posterior Pelvic Tilt  x10 3 Supine March  2x10  Supine Bridge 2x10 3 Hooklying Clamshell 2x10 GTB  Self Care: Purpose of strength and flexibility exs to address low back function and pain  [facility] Adult PT Treatment:                                                DATE: 12/12/24 Therapeutic Exercise: Developed, instructed in, and pt completed therex as noted in HEP  Self Care: Eval findings and the purpose of PT                                                                                                                                 PATIENT EDUCATION:  Education details: Eval findings, POC, HEP, self care  Person  educated: Patient Education method: Explanation, Demonstration, Tactile cues, Verbal cues, and Handouts Education comprehension: verbalized understanding, returned demonstration, verbal cues required, and tactile cues required  HOME EXERCISE PROGRAM: Access Code: [email or web address] URL: [email or web address] Date: 12/17/2024 Prepared by: Dasie Daft  Exercises - Seated Flexion Stretch with Swiss Ball  - 1 x daily - 7 x weekly - 1 sets - 10 reps - 5-30 hold - Supine Posterior Pelvic Tilt  - 1 x daily - 7 x weekly - 1-2 sets - 10 reps - 3 hold - Curl Up with Arms Crossed  - 1 x daily - 7 x weekly - 1-2 sets - 10 reps - 3 hold - Supine March  - 1 x daily - 7 x weekly - 1-2 sets - 10 reps - Supine Bridge  - 1 x daily - 7 x weekly - 1-2 sets - 10 reps - 3 hold - Hooklying Clamshell with Resistance  - 1 x daily - 7 x weekly - 1-2 sets - 10 reps - 3 hold - Curl Up with Arms Crossed  - 1 x daily - 7 x weekly - 3 sets - 10 reps  ASSESSMENT:  CLINICAL IMPRESSION:  PT today focused core/hip strength as well as trunk flexibility.   Pt tolerated PT today without adverse effects.   EVAL: Patient is a 59 y.o. female who was seen today for physical therapy evaluation and treatment for  M54.50,G89.29 (ICD-10-CM) - Chronic  bilateral low back pain without sciatica  M25.551,M25.552 (ICD-10-CM) - Bilateral hip pain  M25.561,M25.562 (ICD-10-CM) - Pain in both knees, unspecified chronicity  Pt presents with postural deficits, a weak core and hips, and low back pain with intermittent low back and radicular pain with prolonged standing and walking which is relived by rest. A HEP was initiated. Pt will benefit from skilled 2w8 PT to address impairments to optimize back function with less pain.   OBJECTIVE IMPAIRMENTS: decreased activity tolerance, decreased strength, postural dysfunction, pain, and high BMI.   ACTIVITY LIMITATIONS: carrying, lifting, sitting, and locomotion level  PARTICIPATION  LIMITATIONS: meal prep, cleaning, laundry, community activity, and occupation  PERSONAL FACTORS: Fitness, Past/current experiences, Time since onset of injury/illness/exacerbation, and 3+ comorbidities:   High BMI, lupus, axiety/depression are also affecting patient's functional outcome.   REHAB POTENTIAL: Good  CLINICAL DECISION MAKING: Evolving/moderate complexity  EVALUATION COMPLEXITY: Moderate   GOALS:  SHORT TERM GOALS: Target date: 12/26/24  Pt will be Ind in an initial HEP  Baseline: started Goal status: Ongoing  2.  Pt will report 25% or greater decrease in pain for improved standing and walking tolerance and QOL Baseline: 9/10 Goal status: INITIAL  LONG TERM GOALS: Target date: 02/13/25  Pt will be Ind in a final HEP to maintain achieved LOF  Baseline: started Goal status: INITIAL  2.  Pt will report 75% or greater decrease in pain for improved standing and walking tolerance and QOL Baseline: 9/10 Goal status: INITIAL  3.  Pt will demonstrate 4+ or greater hip strength for improved back support Baseline:  Goal status: INITIAL  4.  Improve 5xSTS by MCID of 5 as indication of improved functional mobility  Baseline: TBA Goal status: INITIAL  5.  Pt's Mod Oswestry score will improve by the MCID to 33% as indication of improved function  Baseline: 46% Goal status: INITIAL PLAN:  PT FREQUENCY: 2x/week  PT DURATION: 2 weeks  PLANNED INTERVENTIONS: 97164- PT Re-evaluation, 97110-Therapeutic exercises, 97530- Therapeutic activity, 97112- Neuromuscular re-education, 97535- Self Care, 02859- Manual therapy, [id number]- Aquatic Therapy, [id number]- Electrical stimulation (unattended), 20560 (1-2 muscles), 20561 (3+ muscles)- Dry Needling, Patient/Family education, Joint mobilization, Spinal mobilization, Cryotherapy, and Moist heat.  PLAN FOR NEXT SESSION: Assess response to HEP; progress therex as indicated; use of modalities, manual therapy; and TPDN as indicated.   Harlene Persons, PTA 12/25/24 9:21 AM Phone: [phone] Fax: [phone]  "

## 2024-12-30 ENCOUNTER — Ambulatory Visit

## 2024-12-31 NOTE — Therapy (Signed)
 " OUTPATIENT PHYSICAL THERAPY THORACOLUMBAR TREATMENT   Patient Name: Holly Ortiz MRN: [id number] DOB:[birth year], 59 y.o., female Today's Date: 01/01/2025  END OF SESSION:  PT End of Session - 01/01/25 0746     Visit Number 5    Number of Visits 17    Date for Recertification  02/13/25    Authorization Type [employer]ET[employer] NAP    PT Start Time 0720    PT Stop Time 0800    PT Time Calculation (min) 40 min    Activity Tolerance Patient tolerated treatment well    Behavior During Therapy [facility] for tasks assessed/performed             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-I)    Anxiety    on meds   Asthma    childhood   Cataract 2015   bilateral   Constipation    takes Miralax  daily   Depression    on meds   Diabetes mellitus    type II-on meds   Hyperlipidemia    on meds   Hypertension    on meds   Ketoacidosis 07/23/2024   Kidney stone    Lupus 2022   dx 02/2021   Seasonal allergies    Urticaria, chronic    UTI (lower urinary tract infection)    Past Surgical History:  Procedure Laterality Date   ABDOMINAL HYSTERECTOMY  08/07/2019   BREAST ENHANCEMENT SURGERY  1993   breast lift/reduction   CATARACT EXTRACTION, BILATERAL Bilateral 2014   CATARACT EXTRACTION, BILATERAL Bilateral 04/2022   [place]ONOSCOPY  2014   in [place]   EXTRACORPOREAL SHOCK WAVE LITHOTRIPSY Left 06/16/2024   Procedure: LITHOTRIPSY, ESWL;  Surgeon: Lovie Arlyss CROME, MD;  Location: [facility];  Service: Urology;  Laterality: Left;   LAPAROSCOPIC VAGINAL HYSTERECTOMY WITH SALPINGO OOPHORECTOMY Bilateral 08/07/2019   Procedure: LAPAROSCOPIC ASSISTED VAGINAL HYSTERECTOMY WITH SALPINGO OOPHORECTOMY, possible abdominal hysterectomy;  Surgeon: Curlene Agent, MD;  Location: [facility];  Service: Gynecology;  Laterality: Bilateral;  possible abdominal hysterectomy pt is diabetic and hypertensive   SOFT TISSUE MASS EXCISION Right 2016   [place]    TUBAL LIGATION  1995   tummy tuck  1993   Patient Active Problem List   Diagnosis Date Noted   Iron  deficiency anemia, unspecified 07/24/2024   UTI (urinary tract infection) 07/23/2024   Nausea and vomiting 07/23/2024   IT band syndrome 05/01/2024   Acute respiratory failure with hypoxia (HCC) 02/24/2024   Headache 05/03/2023   Other fatigue 01/25/2023   Rash and other nonspecific skin eruption 01/25/2023   Urinary frequency 08/02/2022   Left flank pain 07/31/2022   Pain in left knee 05/03/2021   Arthralgia 02/24/2021   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   Iron  deficiency 07/07/2020   Left low back pain 01/14/2020   Anemia 12/02/2019   Lightheadedness 12/02/2019   Menorrhagia 08/07/2019   Abnormal uterine bleeding 08/07/2019   Anxiety and depression 07/03/2018   DKA (diabetic ketoacidosis) (HCC) 03/28/2018   Tachycardia 03/28/2018   Urticaria 03/28/2018   Hyperosmolar hyperglycemia 01/04/2018   AKI (acute kidney injury) 01/04/2018   Hyperglycemia 01/03/2018   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-I)    Asthma    Type 2 diabetes mellitus with complication, with long-term current use of insulin  (HCC)    Elevated cholesterol    Essential hypertension     PCP: Berneta Elsie Sayre, MD   REFERRING PROVIDER: Jeannetta Lonni ORN, MD  REFERRING DIAG:  M54.50,G89.29 (ICD-10-CM) - Chronic bilateral low back pain without sciatica  M25.551,M25.552 (ICD-10-CM) - Bilateral hip pain  M25.561,M25.562 (ICD-10-CM) - Pain in both knees, unspecified chronicity    Hip pain, chronic low back, hip, and knee pain    Rationale for Evaluation and Treatment: Rehabilitation  THERAPY DIAG:  Other low back pain  Muscle weakness (generalized)  Difficulty in walking, not elsewhere classified  ONSET DATE: Chronic with being more severe for aprox 1 year  SUBJECTIVE:                                                                                                                                                                                            SUBJECTIVE STATEMENT: Pt reports that she has a little bit of low back pain intermittently, and it resolves with 2 tylenol   EVAL: Pt reports low back and radicular pain down both legs to her hips and knees which occurs primarily with prolonged standing and walking. When this happens her legs will become heavy like they are going to give out. She notes her symptoms have been better for the last 3-4 weeks, but she is not sure why.  PERTINENT HISTORY:  High BMI, lupus, axiety/depression  PAIN:  Are you having pain? Yes: NPRS scale: currently 0/10; 9/10 c prolonged standing and walking Pain location: low back and radicular pain down both legs to her hips and  knees Pain description: Throb, sharp Aggravating factors: prolonged standing and walking (40 mins to an hour); prolong sitting Relieving factors: Lie down or sit, OTC pain meds and a muscle relaxer  PRECAUTIONS: None  RED FLAGS: None   WEIGHT BEARING RESTRICTIONS: No  FALLS:  Has patient fallen in last 6 months? Yes. 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 Jeannetta  OBJECTIVE:  Note: Objective measures were completed at Evaluation unless otherwise noted.  DIAGNOSTIC FINDINGS:  None in Epic  PATIENT SURVEYS:  Modified Oswestry: 23/50=46%  Interpretation of scores: Score Category Description  0-20% Minimal Disability The patient can cope with most living activities. Usually no treatment is indicated apart from advice on lifting, sitting and exercise  21-40% Moderate Disability The patient  experiences more pain and difficulty with sitting, lifting and standing. Travel and social life are more difficult and they may be disabled from work. Personal  care, sexual activity and sleeping are not grossly affected, and the patient can usually be managed by conservative means  41-60% Severe Disability Pain remains the main problem in this group, but activities of daily living are affected. These patients require a detailed investigation  61-80% Crippled Back pain impinges on all aspects of the patients life. Positive intervention is required  81-100% Bed-bound These patients are either bed-bound or exaggerating their symptoms    Minimally Clinically Important Difference (MCID) = 12.8%  COGNITION: Overall cognitive status: Within functional limits for tasks assessed     SENSATION: WFL  MUSCLE LENGTH: Hamstrings: Right WNLs deg; Left WNLs deg Debby test: Right WNLs deg; Left WNLs deg  POSTURE: rounded shoulders, forward head, increased thoracic kyphosis, and anterior pelvic tilt  PALPATION: TTP to low back when in pain  LUMBAR ROM:   AROM eval  Flexion Min limited; tightness  Extension Min limited; no pain  Right lateral flexion Full; no pain  Left lateral flexion Min limited; Pressure L  Right rotation Min limited; min Pressure R  Left rotation Min limited; Pressure L   (Blank rows = not tested)  LOWER EXTREMITY ROM:     WFLs Active  Right eval Left eval  Hip flexion    Hip extension    Hip abduction    Hip adduction    Hip internal rotation    Hip external rotation    Knee flexion    Knee extension    Ankle dorsiflexion    Ankle plantarflexion    Ankle inversion    Ankle eversion     (Blank rows = not tested)  LOWER EXTREMITY MMT:    Weak core MMT Right eval Left eval  Hip flexion 4 4  Hip extension 3 3  Hip abduction 4 4  Hip adduction    Hip internal rotation    Hip external rotation 4 4  Knee flexion 5 5  Knee extension 5 5  Ankle  dorsiflexion    Ankle plantarflexion    Ankle inversion    Ankle eversion     (Blank rows = not tested)  LUMBAR SPECIAL TESTS:  Straight leg raise test: Negative and Slump test: Negative  FUNCTIONAL TESTS:  5 times sit to stand: TBA  GAIT: Distance walked: 200' Assistive device utilized: None Level of assistance: Complete Independence Comments: WNLs  TREATMENT DATE:  [facility] Adult PT Treatment:                                                DATE: 01/01/25 Therapeutic Exercise/Activities/Neuromuscular Re-Ed: Nustep 5 min L5 Seated lumbar flexion with laterals using ball SKTC LTR PPT/ab press with medium exercise ball  Supine marching  Bridge 3 sec x 10  Blue band clam 3 sec x 10  Bridge with Blue band x 10, 3 sec Supine hamstring stretch x2 20' Hinged hip STS x10, x10 10# Standing hip ext x10 3 [facility] Adult PT Treatment:                                                DATE: 12/25/24 Nustep 5 min Seated lumbar flexion with laterals using ball LTR  PPT / ab press with medium exercise ball  Supine marching  Bridge 3 sec x 10  Blue band clam 3 sec x 10  Bridge with Blue band x 10 , 3 sec  PATIENT EDUCATION:  Education details: Eval findings, POC, HEP, self care  Person educated: Patient Education method: Explanation, Demonstration, Tactile cues, Verbal cues, and Handouts Education comprehension: verbalized understanding, returned demonstration, verbal cues required, and tactile cues required  HOME EXERCISE PROGRAM: Access Code: [email or web address] URL: [email or web address] Date: 12/17/2024 Prepared by: Dasie Daft  Exercises - Seated Flexion Stretch with Swiss Ball  - 1 x daily - 7 x weekly - 1 sets - 10 reps - 5-30 hold - Supine Posterior Pelvic Tilt  - 1 x daily - 7 x weekly - 1-2 sets - 10 reps - 3 hold - Curl Up with Arms Crossed  - 1 x daily - 7 x weekly - 1-2 sets - 10 reps - 3 hold - Supine March  - 1 x daily - 7 x weekly - 1-2 sets - 10 reps - Supine Bridge   - 1 x daily - 7 x weekly - 1-2 sets - 10 reps - 3 hold - Hooklying Clamshell with Resistance  - 1 x daily - 7 x weekly - 1-2 sets - 10 reps - 3 hold - Curl Up with Arms Crossed  - 1 x daily - 7 x weekly - 3 sets - 10 reps  ASSESSMENT:  CLINICAL IMPRESSION:  PT was completed today for lumbopelvic flexibility and strengthening. Gradual progressions were made with strengthening demand. Pt report indicates good improvement re: her overall low back pain. Pt tolerated prescribed exs without adverse effects. Pt will continue to benefit from skilled PT to address impairments for improved back function with minimized pain.   EVAL: Patient is a 59 y.o. female who was seen today for physical therapy evaluation and treatment for  M54.50,G89.29 (ICD-10-CM) - Chronic bilateral low back pain without sciatica  M25.551,M25.552 (ICD-10-CM) - Bilateral hip pain  M25.561,M25.562 (ICD-10-CM) - Pain in both knees, unspecified chronicity  Pt presents with postural deficits, a weak core and hips, and low back pain with intermittent low back and radicular pain with prolonged standing and walking which is relived by rest. A HEP was initiated. Pt will benefit from skilled 2w8 PT to address impairments to optimize back function with less pain.   OBJECTIVE IMPAIRMENTS: decreased activity tolerance, decreased strength, postural dysfunction, pain, and high BMI.   ACTIVITY LIMITATIONS: carrying, lifting, sitting, and locomotion level  PARTICIPATION LIMITATIONS: meal prep, cleaning, laundry, community activity, and occupation  PERSONAL FACTORS: Fitness, Past/current experiences, Time since onset of injury/illness/exacerbation, and 3+ comorbidities:   High BMI, lupus, axiety/depression are also affecting patient's functional outcome.   REHAB POTENTIAL: Good  CLINICAL DECISION MAKING: Evolving/moderate complexity  EVALUATION COMPLEXITY: Moderate   GOALS:  SHORT TERM GOALS: Target date: 12/26/24  Pt will be Ind in an  initial HEP  Baseline: started Goal status: Ind, but completes inconsistently  2.  Pt will report 25% or greater decrease in pain for improved standing and walking tolerance and QOL Baseline:9/10 01/01/25: Pt est 70% better Goal status: MET  LONG TERM GOALS: Target date: 02/13/25  Pt will be Ind in a final HEP to maintain achieved LOF  Baseline: started Goal status: INITIAL  2.  Pt will report 75% or greater decrease in pain for improved standing and walking tolerance and QOL Baseline: 9/10 Goal status: INITIAL  3.  Pt will demonstrate 4+ or greater hip strength for improved back support Baseline:  Goal status: INITIAL  4.  Improve 5xSTS by MCID of 5 as indication of improved functional mobility  Baseline: TBA Goal status: INITIAL  5.  Pt's Mod Oswestry score will improve by the MCID to 33% as indication of improved function  Baseline: 46% Goal status: INITIAL PLAN:  PT FREQUENCY: 2x/week  PT DURATION: 2 weeks  PLANNED INTERVENTIONS: 97164- PT Re-evaluation, 97110-Therapeutic exercises, 97530- Therapeutic activity, 97112- Neuromuscular re-education, 97535- Self Care, 02859- Manual therapy, [id number]- Aquatic Therapy, [id number]- Electrical stimulation (unattended), 20560 (1-2 muscles), 20561 (3+ muscles)- Dry Needling, Patient/Family education, Joint mobilization, Spinal mobilization, Cryotherapy, and Moist heat.  PLAN FOR NEXT SESSION: Assess response to HEP; progress therex as indicated; use of modalities, manual therapy; and TPDN as indicated.   Inika Bellanger MS, PT 01/01/25 9:52 AM  "

## 2025-01-01 ENCOUNTER — Ambulatory Visit

## 2025-01-01 DIAGNOSIS — R262 Difficulty in walking, not elsewhere classified: Secondary | ICD-10-CM

## 2025-01-01 DIAGNOSIS — M6281 Muscle weakness (generalized): Secondary | ICD-10-CM

## 2025-01-01 DIAGNOSIS — M5459 Other low back pain: Secondary | ICD-10-CM | POA: Diagnosis not present

## 2025-01-06 ENCOUNTER — Ambulatory Visit: Admitting: Physical Therapy

## 2025-01-06 ENCOUNTER — Telehealth: Payer: Self-pay | Admitting: Physical Therapy

## 2025-01-06 NOTE — Telephone Encounter (Signed)
 Contacted patient regarding no show. Left voicemail with next appointment date/time.

## 2025-01-07 NOTE — Therapy (Incomplete)
 " OUTPATIENT PHYSICAL THERAPY THORACOLUMBAR TREATMENT   Patient Name: Holly Ortiz MRN: [id number] DOB:[birth year], 59 y.o., female Today's Date: 01/07/2025  END OF SESSION:       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-I)    Anxiety    on meds   Asthma    childhood   Cataract 2015   bilateral   Constipation    takes Miralax  daily   Depression    on meds   Diabetes mellitus    type II-on meds   Hyperlipidemia    on meds   Hypertension    on meds   Ketoacidosis 07/23/2024   Kidney stone    Lupus 2022   dx 02/2021   Seasonal allergies    Urticaria, chronic    UTI (lower urinary tract infection)    Past Surgical History:  Procedure Laterality Date   ABDOMINAL HYSTERECTOMY  08/07/2019   BREAST ENHANCEMENT SURGERY  1993   breast lift/reduction   CATARACT EXTRACTION, BILATERAL Bilateral 2014   CATARACT EXTRACTION, BILATERAL Bilateral 04/2022   [place]ONOSCOPY  2014   in [place]   EXTRACORPOREAL SHOCK WAVE LITHOTRIPSY Left 06/16/2024   Procedure: LITHOTRIPSY, ESWL;  Surgeon: Lovie Arlyss CROME, MD;  Location: [facility];  Service: Urology;  Laterality: Left;   LAPAROSCOPIC VAGINAL HYSTERECTOMY WITH SALPINGO OOPHORECTOMY Bilateral 08/07/2019   Procedure: LAPAROSCOPIC ASSISTED VAGINAL HYSTERECTOMY WITH SALPINGO OOPHORECTOMY, possible abdominal hysterectomy;  Surgeon: Curlene Agent, MD;  Location: [facility] OR;  Service: Gynecology;  Laterality: Bilateral;  possible abdominal hysterectomy pt is diabetic and hypertensive   SOFT TISSUE MASS EXCISION Right 2016   [place]   TUBAL LIGATION  1995   tummy tuck  1993   Patient Active Problem List   Diagnosis Date Noted   Iron  deficiency anemia, unspecified 07/24/2024   UTI (urinary tract infection) 07/23/2024   Nausea and vomiting 07/23/2024   IT band syndrome 05/01/2024   Acute respiratory failure with hypoxia (HCC) 02/24/2024   Headache 05/03/2023   Other fatigue  01/25/2023   Rash and other nonspecific skin eruption 01/25/2023   Urinary frequency 08/02/2022   Left flank pain 07/31/2022   Pain in left knee 05/03/2021   Arthralgia 02/24/2021   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   Iron  deficiency 07/07/2020   Left low back pain 01/14/2020   Anemia 12/02/2019   Lightheadedness 12/02/2019   Menorrhagia 08/07/2019   Abnormal uterine bleeding 08/07/2019   Anxiety and depression 07/03/2018   DKA (diabetic ketoacidosis) (HCC) 03/28/2018   Tachycardia 03/28/2018   Urticaria 03/28/2018   Hyperosmolar hyperglycemia 01/04/2018   AKI (acute kidney injury) 01/04/2018   Hyperglycemia 01/03/2018   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-I)    Asthma    Type 2 diabetes mellitus with complication, with long-term current use of insulin  (HCC)    Elevated cholesterol    Essential hypertension     PCP: Berneta Elsie Sayre, MD   REFERRING PROVIDER: Jeannetta Lonni ORN, MD   REFERRING DIAG:  M54.50,G89.29 (ICD-10-CM) - Chronic bilateral low back pain without sciatica  M25.551,M25.552 (ICD-10-CM) - Bilateral hip pain  M25.561,M25.562 (ICD-10-CM) - Pain in both knees, unspecified chronicity    Hip pain, chronic low back, hip, and knee pain    Rationale for Evaluation and Treatment: Rehabilitation  THERAPY DIAG:  No diagnosis found.  ONSET DATE: Chronic with being more severe for aprox 1 year  SUBJECTIVE:  SUBJECTIVE STATEMENT: Pt reports that she has a little bit of low back pain intermittently, and it resolves with 2 tylenol   EVAL: Pt reports low back and radicular pain down both legs to her hips and knees which occurs primarily with prolonged standing and walking. When this happens her legs will become heavy like they are  going to give out. She notes her symptoms have been better for the last 3-4 weeks, but she is not sure why.  PERTINENT HISTORY:  High BMI, lupus, axiety/depression  PAIN:  Are you having pain? Yes: NPRS scale: currently 0/10; 9/10 c prolonged standing and walking Pain location: low back and radicular pain down both legs to her hips and  knees Pain description: Throb, sharp Aggravating factors: prolonged standing and walking (40 mins to an hour); prolong sitting Relieving factors: Lie down or sit, OTC pain meds and a muscle relaxer  PRECAUTIONS: None  RED FLAGS: None   WEIGHT BEARING RESTRICTIONS: No  FALLS:  Has patient fallen in last 6 months? Yes. 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 Jeannetta  OBJECTIVE:  Note: Objective measures were completed at Evaluation unless otherwise noted.  DIAGNOSTIC FINDINGS:  None in Epic  PATIENT SURVEYS:  Modified Oswestry: 23/50=46%  Interpretation of scores: Score Category Description  0-20% Minimal Disability The patient can cope with most living activities. Usually no treatment is indicated apart from advice on lifting, sitting and exercise  21-40% Moderate Disability The patient experiences more pain and difficulty with sitting, lifting and standing. Travel and social life are more difficult and they may be disabled from work. Personal care, sexual activity and sleeping are not grossly affected, and the patient can usually be managed by conservative means  41-60% Severe Disability Pain remains the main problem in this group, but activities of daily living are affected. These patients require a detailed  investigation  61-80% Crippled Back pain impinges on all aspects of the patients life. Positive intervention is required  81-100% Bed-bound These patients are either bed-bound or exaggerating their symptoms    Minimally Clinically Important Difference (MCID) = 12.8%  COGNITION: Overall cognitive status: Within functional limits for tasks assessed     SENSATION: WFL  MUSCLE LENGTH: Hamstrings: Right WNLs deg; Left WNLs deg Debby test: Right WNLs deg; Left WNLs deg  POSTURE: rounded shoulders, forward head, increased thoracic kyphosis, and anterior pelvic tilt  PALPATION: TTP to low back when in pain  LUMBAR ROM:   AROM eval  Flexion Min limited; tightness  Extension Min limited; no pain  Right lateral flexion Full; no pain  Left lateral flexion Min limited; Pressure L  Right rotation Min limited; min Pressure R  Left rotation Min limited; Pressure L   (Blank rows = not tested)  LOWER EXTREMITY ROM:     WFLs Active  Right eval Left eval  Hip flexion    Hip extension    Hip abduction    Hip adduction    Hip internal rotation    Hip external rotation    Knee flexion    Knee extension    Ankle dorsiflexion    Ankle plantarflexion    Ankle inversion    Ankle eversion     (  Blank rows = not tested)  LOWER EXTREMITY MMT:    Weak core MMT Right eval Left eval  Hip flexion 4 4  Hip extension 3 3  Hip abduction 4 4  Hip adduction    Hip internal rotation    Hip external rotation 4 4  Knee flexion 5 5  Knee extension 5 5  Ankle dorsiflexion    Ankle plantarflexion    Ankle inversion    Ankle eversion     (Blank rows = not tested)  LUMBAR SPECIAL TESTS:  Straight leg raise test: Negative and Slump test: Negative  FUNCTIONAL TESTS:  5 times sit to stand: TBA  GAIT: Distance walked: 200' Assistive device utilized: None Level of assistance: Complete Independence Comments: WNLs  TREATMENT DATE:  [facility] Adult PT Treatment:                                                 DATE: 01/08/25 Therapeutic Exercise: Nustep 5 min L5 Seated lumbar flexion with laterals using ball SKTC LTR PPT/ab press with medium exercise ball  Supine marching  Bridge 3 sec x 10  Blue band clam 3 sec x 10  Bridge with Blue band x 10, 3 sec Supine hamstring stretch x2 20' Hinged hip STS x10, x10 10# Standing hip ext x10 3 Manual Therapy: *** Neuromuscular re-ed: *** Therapeutic Activity: *** Modalities: *** Self Care: ***  [facility] Adult PT Treatment:                                                DATE: 01/01/25 Therapeutic Exercise/Activities/Neuromuscular Re-Ed: Nustep 5 min L5 Seated lumbar flexion with laterals using ball SKTC LTR PPT/ab press with medium exercise ball  Supine marching  Bridge 3 sec x 10  Blue band clam 3 sec x 10  Bridge with Blue band x 10, 3 sec Supine hamstring stretch x2 20' Hinged hip STS x10, x10 10# Standing hip ext x10 3 [facility] Adult PT Treatment:                                                DATE: 12/25/24 Nustep 5 min Seated lumbar flexion with laterals using ball LTR  PPT / ab press with medium exercise ball  Supine marching  Bridge 3 sec x 10  Blue band clam 3 sec x 10  Bridge with Blue band x 10 , 3 sec  PATIENT EDUCATION:  Education details: Eval findings, POC, HEP, self care  Person educated: Patient Education method: Explanation, Demonstration, Tactile cues, Verbal cues, and Handouts Education comprehension: verbalized understanding, returned demonstration, verbal cues required, and tactile cues required  HOME EXERCISE PROGRAM: Access Code: [email or web address] URL: [email or web address] Date: 12/17/2024 Prepared by: Dasie Daft  Exercises - Seated Flexion Stretch with Swiss Ball  - 1 x daily - 7 x weekly - 1 sets - 10 reps - 5-30 hold - Supine Posterior Pelvic Tilt  - 1 x daily - 7 x weekly - 1-2 sets - 10 reps - 3 hold - Curl Up with Arms Crossed  - 1 x daily - 7 x weekly -  1-2 sets - 10 reps - 3  hold - Supine March  - 1 x daily - 7 x weekly - 1-2 sets - 10 reps - Supine Bridge  - 1 x daily - 7 x weekly - 1-2 sets - 10 reps - 3 hold - Hooklying Clamshell with Resistance  - 1 x daily - 7 x weekly - 1-2 sets - 10 reps - 3 hold - Curl Up with Arms Crossed  - 1 x daily - 7 x weekly - 3 sets - 10 reps  ASSESSMENT:  CLINICAL IMPRESSION:  PT was completed today for lumbopelvic flexibility and strengthening. Gradual progressions were made with strengthening demand. Pt report indicates good improvement re: her overall low back pain. Pt tolerated prescribed exs without adverse effects. Pt will continue to benefit from skilled PT to address impairments for improved back function with minimized pain.   EVAL: Patient is a 59 y.o. female who was seen today for physical therapy evaluation and treatment for  M54.50,G89.29 (ICD-10-CM) - Chronic bilateral low back pain without sciatica  M25.551,M25.552 (ICD-10-CM) - Bilateral hip pain  M25.561,M25.562 (ICD-10-CM) - Pain in both knees, unspecified chronicity  Pt presents with postural deficits, a weak core and hips, and low back pain with intermittent low back and radicular pain with prolonged standing and walking which is relived by rest. A HEP was initiated. Pt will benefit from skilled 2w8 PT to address impairments to optimize back function with less pain.   OBJECTIVE IMPAIRMENTS: decreased activity tolerance, decreased strength, postural dysfunction, pain, and high BMI.   ACTIVITY LIMITATIONS: carrying, lifting, sitting, and locomotion level  PARTICIPATION LIMITATIONS: meal prep, cleaning, laundry, community activity, and occupation  PERSONAL FACTORS: Fitness, Past/current experiences, Time since onset of injury/illness/exacerbation, and 3+ comorbidities:   High BMI, lupus, axiety/depression are also affecting patient's functional outcome.   REHAB POTENTIAL: Good  CLINICAL DECISION MAKING: Evolving/moderate complexity  EVALUATION COMPLEXITY:  Moderate   GOALS:  SHORT TERM GOALS: Target date: 12/26/24  Pt will be Ind in an initial HEP  Baseline: started Goal status: Ind, but completes inconsistently  2.  Pt will report 25% or greater decrease in pain for improved standing and walking tolerance and QOL Baseline:9/10 01/01/25: Pt est 70% better Goal status: MET  LONG TERM GOALS: Target date: 02/13/25  Pt will be Ind in a final HEP to maintain achieved LOF  Baseline: started Goal status: INITIAL  2.  Pt will report 75% or greater decrease in pain for improved standing and walking tolerance and QOL Baseline: 9/10 Goal status: INITIAL  3.  Pt will demonstrate 4+ or greater hip strength for improved back support Baseline:  Goal status: INITIAL  4.  Improve 5xSTS by MCID of 5 as indication of improved functional mobility  Baseline: TBA Goal status: INITIAL  5.  Pt's Mod Oswestry score will improve by the MCID to 33% as indication of improved function  Baseline: 46% Goal status: INITIAL PLAN:  PT FREQUENCY: 2x/week  PT DURATION: 2 weeks  PLANNED INTERVENTIONS: 97164- PT Re-evaluation, 97110-Therapeutic exercises, 97530- Therapeutic activity, 97112- Neuromuscular re-education, 97535- Self Care, [id number]- Manual therapy, [id number]- Aquatic Therapy, [id number]- Electrical stimulation (unattended), 20560 (1-2 muscles), 20561 (3+ muscles)- Dry Needling, Patient/Family education, Joint mobilization, Spinal mobilization, Cryotherapy, and Moist heat.  PLAN FOR NEXT SESSION: Assess response to HEP; progress therex as indicated; use of modalities, manual therapy; and TPDN as indicated.   Rollo Farquhar MS, PT 01/07/25 2:24 PM  "

## 2025-01-08 ENCOUNTER — Ambulatory Visit

## 2025-01-13 ENCOUNTER — Ambulatory Visit

## 2025-01-20 ENCOUNTER — Ambulatory Visit: Admitting: Physical Therapy

## 2025-01-27 ENCOUNTER — Ambulatory Visit

## 2025-02-09 ENCOUNTER — Ambulatory Visit: Admitting: Internal Medicine

## 2025-02-24 ENCOUNTER — Ambulatory Visit: Admitting: Endocrinology

## 2025-05-14 ENCOUNTER — Ambulatory Visit: Admitting: Family Medicine
